# Patient Record
Sex: Female | Born: 1975
Health system: Southern US, Community
[De-identification: ages and names within clinical notes are randomized; demographics above are authoritative.]

## PROBLEM LIST (undated history)

## (undated) DIAGNOSIS — E079 Disorder of thyroid, unspecified: Secondary | ICD-10-CM

## (undated) DIAGNOSIS — T7840XA Allergy, unspecified, initial encounter: Secondary | ICD-10-CM

## (undated) DIAGNOSIS — J302 Other seasonal allergic rhinitis: Secondary | ICD-10-CM

## (undated) DIAGNOSIS — R5381 Other malaise: Secondary | ICD-10-CM

## (undated) DIAGNOSIS — E559 Vitamin D deficiency, unspecified: Secondary | ICD-10-CM

## (undated) DIAGNOSIS — Z803 Family history of malignant neoplasm of breast: Secondary | ICD-10-CM

## (undated) DIAGNOSIS — F419 Anxiety disorder, unspecified: Secondary | ICD-10-CM

## (undated) DIAGNOSIS — J020 Streptococcal pharyngitis: Secondary | ICD-10-CM

## (undated) DIAGNOSIS — I2089 Other forms of angina pectoris: Secondary | ICD-10-CM

## (undated) DIAGNOSIS — M316 Other giant cell arteritis: Secondary | ICD-10-CM

## (undated) DIAGNOSIS — R590 Localized enlarged lymph nodes: Secondary | ICD-10-CM

## (undated) DIAGNOSIS — E785 Hyperlipidemia, unspecified: Secondary | ICD-10-CM

## (undated) DIAGNOSIS — M314 Aortic arch syndrome [Takayasu]: Secondary | ICD-10-CM

## (undated) DIAGNOSIS — K219 Gastro-esophageal reflux disease without esophagitis: Secondary | ICD-10-CM

## (undated) DIAGNOSIS — Z808 Family history of malignant neoplasm of other organs or systems: Secondary | ICD-10-CM

## (undated) DIAGNOSIS — J189 Pneumonia, unspecified organism: Secondary | ICD-10-CM

## (undated) DIAGNOSIS — D649 Anemia, unspecified: Secondary | ICD-10-CM

## (undated) DIAGNOSIS — R6882 Decreased libido: Secondary | ICD-10-CM

## (undated) DIAGNOSIS — R0600 Dyspnea, unspecified: Secondary | ICD-10-CM

## (undated) DIAGNOSIS — A879 Viral meningitis, unspecified: Secondary | ICD-10-CM

## (undated) DIAGNOSIS — R011 Cardiac murmur, unspecified: Secondary | ICD-10-CM

## (undated) DIAGNOSIS — M1990 Unspecified osteoarthritis, unspecified site: Secondary | ICD-10-CM

## (undated) DIAGNOSIS — Z9889 Other specified postprocedural states: Secondary | ICD-10-CM

## (undated) DIAGNOSIS — Z800 Family history of malignant neoplasm of digestive organs: Secondary | ICD-10-CM

## (undated) DIAGNOSIS — M0600 Rheumatoid arthritis without rheumatoid factor, unspecified site: Secondary | ICD-10-CM

## (undated) DIAGNOSIS — J679 Hypersensitivity pneumonitis due to unspecified organic dust: Secondary | ICD-10-CM

## (undated) DIAGNOSIS — F32A Depression, unspecified: Secondary | ICD-10-CM

## (undated) DIAGNOSIS — B2790 Infectious mononucleosis, unspecified without complication: Secondary | ICD-10-CM

## (undated) DIAGNOSIS — N809 Endometriosis, unspecified: Secondary | ICD-10-CM

## (undated) DIAGNOSIS — I208 Other forms of angina pectoris: Secondary | ICD-10-CM

## (undated) DIAGNOSIS — R112 Nausea with vomiting, unspecified: Secondary | ICD-10-CM

## (undated) DIAGNOSIS — E039 Hypothyroidism, unspecified: Secondary | ICD-10-CM

## (undated) HISTORY — DX: Anxiety disorder, unspecified: F41.9

## (undated) HISTORY — DX: Family history of malignant neoplasm of digestive organs: Z80.0

## (undated) HISTORY — DX: Endometriosis, unspecified: N80.9

## (undated) HISTORY — PX: LAPAROSCOPIC ABDOMINAL EXPLORATION: SHX6249

## (undated) HISTORY — DX: Family history of malignant neoplasm of other organs or systems: Z80.8

## (undated) HISTORY — DX: Depression, unspecified: F32.A

## (undated) HISTORY — DX: Allergy, unspecified, initial encounter: T78.40XA

## (undated) HISTORY — DX: Gastro-esophageal reflux disease without esophagitis: K21.9

## (undated) HISTORY — DX: Family history of malignant neoplasm of breast: Z80.3

## (undated) HISTORY — DX: Cardiac murmur, unspecified: R01.1

---
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
## 2004-09-18 ENCOUNTER — Emergency Department: Payer: Self-pay | Admitting: Emergency Medicine

## 2004-09-19 ENCOUNTER — Ambulatory Visit: Payer: Self-pay | Admitting: Emergency Medicine

## 2005-10-24 ENCOUNTER — Ambulatory Visit: Payer: Self-pay | Admitting: Obstetrics and Gynecology

## 2005-10-31 ENCOUNTER — Ambulatory Visit: Payer: Self-pay | Admitting: Otolaryngology

## 2005-12-05 ENCOUNTER — Ambulatory Visit: Payer: Self-pay | Admitting: Otolaryngology

## 2005-12-15 HISTORY — PX: NOSE SURGERY: SHX723

## 2007-12-23 ENCOUNTER — Ambulatory Visit: Payer: Self-pay | Admitting: Obstetrics and Gynecology

## 2008-09-11 ENCOUNTER — Ambulatory Visit: Payer: Self-pay | Admitting: Obstetrics & Gynecology

## 2008-09-14 ENCOUNTER — Ambulatory Visit: Payer: Self-pay | Admitting: Obstetrics & Gynecology

## 2008-10-12 ENCOUNTER — Ambulatory Visit: Payer: Self-pay | Admitting: Chiropractic Medicine

## 2011-12-16 HISTORY — PX: HIP SURGERY: SHX245

## 2011-12-22 ENCOUNTER — Ambulatory Visit: Payer: Self-pay

## 2011-12-31 ENCOUNTER — Ambulatory Visit: Payer: Self-pay

## 2015-10-10 ENCOUNTER — Encounter: Payer: Self-pay | Admitting: General Surgery

## 2015-10-29 ENCOUNTER — Encounter: Payer: Self-pay | Admitting: General Surgery

## 2015-10-29 ENCOUNTER — Ambulatory Visit (INDEPENDENT_AMBULATORY_CARE_PROVIDER_SITE_OTHER): Payer: BLUE CROSS/BLUE SHIELD | Admitting: General Surgery

## 2015-10-29 ENCOUNTER — Ambulatory Visit: Payer: BLUE CROSS/BLUE SHIELD

## 2015-10-29 VITALS — BP 124/66 | HR 76 | Resp 14 | Ht 64.0 in | Wt 148.0 lb

## 2015-10-29 DIAGNOSIS — N63 Unspecified lump in breast: Secondary | ICD-10-CM | POA: Diagnosis not present

## 2015-10-29 DIAGNOSIS — N6310 Unspecified lump in the right breast, unspecified quadrant: Secondary | ICD-10-CM

## 2015-10-29 NOTE — Progress Notes (Signed)
Patient ID: Kara Anderson, female   DOB: [birth year], 39 y.o.   MRN: [id number]  Chief Complaint  Patient presents with  . Other    Right breast mass    HPI Kara Anderson is a 39 y.o. female here today for an evaluation of for right breast lump/tenderness. She noticed this about 2 months ago. She states her last mammogram was done in 2013. She does perform self breast checks. She has been a massage therapist for 15 years. She is right-hand dominant, and does use her upper extremity vigorously during her work. No history of trauma to the breast. HPI  Past Medical History  Diagnosis Date  . Endometriosis 2007,2009    Past Surgical History  Procedure Laterality Date  . Laparoscopic abdominal exploration  [id number]    endometriosis  . Hip surgery  2013  . Nose surgery  2007    Family History  Problem Relation Age of Onset  . Heart disease Mother   . Stroke Mother   . Diabetes Mother   . Heart disease Father   . Alcohol abuse Father     Social History Social History  Substance Use Topics  . Smoking status: Never Smoker   . Smokeless tobacco: Never Used  . Alcohol Use: No    Allergies  Allergen Reactions  . Codeine Nausea Only    Current Outpatient Prescriptions  Medication Sig Dispense Refill  . Multiple Vitamins-Minerals (MULTIVITAMIN GUMMIES WOMENS) CHEW Chew by mouth 2 (two) times daily.     No current facility-administered medications for this visit.    Review of Systems Review of Systems  Constitutional: Negative.   Respiratory: Negative.   Cardiovascular: Negative.     Blood pressure 124/66, pulse 76, resp. rate 14, height 5\' 4"  (1.626 m), weight 148 lb (67.132 kg), last menstrual period 10/08/2015.  Physical Exam Physical Exam  Constitutional: She is oriented to person, place, and time. She appears well-developed and well-nourished.  Eyes: Conjunctivae are normal. No scleral icterus.  Neck: Neck supple.  Cardiovascular: Normal rate, regular rhythm  and normal heart sounds.   Pulmonary/Chest: Effort normal and breath sounds normal. Right breast exhibits no inverted nipple, no mass, no nipple discharge, no skin change and no tenderness. Left breast exhibits no inverted nipple, no mass, no nipple discharge, no skin change and no tenderness. Breasts are asymmetrical (right larger than left).    Lymphadenopathy:    She has no cervical adenopathy.    She has no axillary adenopathy.  Neurological: She is alert and oriented to person, place, and time.  Skin: Skin is warm and dry.  Psychiatric: Her behavior is normal.    Data Reviewed Ultrasound examination of the right breast was completed in the upper-outer quadrant. There is a small lymph node in the inferior aspect of the axilla measuring 0.46 x 0.49 x 0.62. Fatty hilum is preserved. Normal vascular flow.  The area of prominence on the patient's exam corresponds with the end of the breast tissue in the axillary tail. This is about 8 cm from the edge of the nipple and the entire o'clock position. No discernible area of architectural distortion, cystic or solid lesions are identified. BI-RADS-2.  Assessment    Benign breast exam, prominence of axillary tail without underlying pathology.     Plan    Pros and cons of regular self examination were reviewed. The patient was encouraged to call should she appreciate any changes or develop any focal discomfort.      Call the  office with any changes or concerns.   Ref: Dr. Ammie Dalton PCP: Allyn Kenner, MD  Robert Bellow 10/29/2015, 8:37 PM

## 2015-10-29 NOTE — Patient Instructions (Addendum)
Call the office with any changes or concerns.

## 2017-10-22 DIAGNOSIS — R5383 Other fatigue: Secondary | ICD-10-CM | POA: Diagnosis not present

## 2017-10-22 DIAGNOSIS — R0600 Dyspnea, unspecified: Secondary | ICD-10-CM | POA: Diagnosis not present

## 2017-10-22 DIAGNOSIS — R6882 Decreased libido: Secondary | ICD-10-CM | POA: Diagnosis not present

## 2017-10-22 DIAGNOSIS — E785 Hyperlipidemia, unspecified: Secondary | ICD-10-CM | POA: Diagnosis not present

## 2017-10-22 DIAGNOSIS — Z0000 Encounter for general adult medical examination without abnormal findings: Secondary | ICD-10-CM | POA: Diagnosis not present

## 2017-10-22 DIAGNOSIS — R011 Cardiac murmur, unspecified: Secondary | ICD-10-CM | POA: Diagnosis not present

## 2017-10-22 DIAGNOSIS — E559 Vitamin D deficiency, unspecified: Secondary | ICD-10-CM | POA: Diagnosis not present

## 2017-10-23 ENCOUNTER — Ambulatory Visit (HOSPITAL_COMMUNITY)
Admission: RE | Admit: 2017-10-23 | Discharge: 2017-10-23 | Disposition: A | Discharge status: Home / Self Care | Payer: BLUE CROSS/BLUE SHIELD | Source: Ambulatory Visit | Attending: Internal Medicine | Admitting: Internal Medicine

## 2017-10-23 ENCOUNTER — Other Ambulatory Visit (HOSPITAL_COMMUNITY): Payer: Self-pay | Admitting: Internal Medicine

## 2017-10-23 ENCOUNTER — Ambulatory Visit (HOSPITAL_COMMUNITY): Payer: Self-pay

## 2017-10-23 ENCOUNTER — Encounter (HOSPITAL_COMMUNITY): Payer: Self-pay

## 2017-10-23 DIAGNOSIS — R011 Cardiac murmur, unspecified: Secondary | ICD-10-CM | POA: Diagnosis not present

## 2017-10-23 DIAGNOSIS — R0600 Dyspnea, unspecified: Secondary | ICD-10-CM | POA: Diagnosis not present

## 2017-10-23 DIAGNOSIS — N6310 Unspecified lump in the right breast, unspecified quadrant: Secondary | ICD-10-CM | POA: Diagnosis not present

## 2017-10-23 NOTE — Progress Notes (Signed)
*  PRELIMINARY RESULTS* Echocardiogram 2D Echocardiogram has been performed.  Leavy Cella 10/23/2017, 10:47 AM

## 2017-10-29 ENCOUNTER — Encounter: Payer: Self-pay | Admitting: Cardiovascular Disease

## 2017-10-29 ENCOUNTER — Ambulatory Visit (INDEPENDENT_AMBULATORY_CARE_PROVIDER_SITE_OTHER): Payer: BLUE CROSS/BLUE SHIELD | Admitting: Cardiovascular Disease

## 2017-10-29 VITALS — BP 102/62 | HR 68 | Ht 63.5 in | Wt 151.5 lb

## 2017-10-29 DIAGNOSIS — R0602 Shortness of breath: Secondary | ICD-10-CM | POA: Diagnosis not present

## 2017-10-29 DIAGNOSIS — R011 Cardiac murmur, unspecified: Secondary | ICD-10-CM | POA: Diagnosis not present

## 2017-10-29 DIAGNOSIS — R0989 Other specified symptoms and signs involving the circulatory and respiratory systems: Secondary | ICD-10-CM | POA: Diagnosis not present

## 2017-10-29 NOTE — Patient Instructions (Addendum)
Medication Instructions:   No medication changes made  Labwork:  No new labs needed  Testing/Procedures:  No further testing at this time   Follow-Up: It was a pleasure seeing you in the office today. Please call us if you have new issues that need to be addressed before your next appt.  [phone]  Your physician wants you to follow-up in:  We will order a carotid u/s on the right for bruit 10/30/17  If you need a refill on your cardiac medications before your next appointment, please call your pharmacy.

## 2017-10-29 NOTE — Progress Notes (Signed)
Cardiology Office Note  Date:  10/29/2017   ID:  Kara Anderson, DOB 1976/10/22, MRN [id number]  PCP:  Doree Albee, MD   Chief Complaint  Patient presents with  . other    New patient. Self referral for SOB and heart murmur. Meds reviewed verbally with patient.     HPI:  [name] is a 41 year old woman with no prior cardiac history who presents by self referral for murmur.  Recently seen by Dr. Anastasio Champion in [facility]  Reports having increasing shortness of breath  Long hiatus from exercise, now starting to get back into her exercise regimen Uncertain if the shortness of breath she is experiencing is secondary to deconditioning or something else  Denies any PND, cough, lower extremity edema, weight gain No significant change in her weight in general Works on a farm, generally very active at baseline Able to exert herself on flat surfaces for long periods of time, more difficulty with hills and stairs  Echocardiogram October 23, 2017 reviewed with her showing  Images pulled up in the office  normal LV systolic function, mildly calcified aortic valve leaflets, otherwise no significant findings to explain murmur   EKG personally reviewed by myself on todays visit shows normal sinus rhythm rate 68 bpm no significant ST or T wave changes   PMH:   has a past medical history of Endometriosis (2007,2009).  PSH:    Past Surgical History:  Procedure Laterality Date  . HIP SURGERY  2013  . LAPAROSCOPIC ABDOMINAL EXPLORATION  2007,2009   endometriosis  . NOSE SURGERY  2007    Current Outpatient Medications  Medication Sig Dispense Refill  . aspirin 81 MG chewable tablet Chew 81 mg daily by mouth.    . cholecalciferol (VITAMIN D) 1000 units tablet Take daily by mouth.    . Cyanocobalamin (VITAMIN B 12 PO) Take by mouth.    . montelukast (SINGULAIR) 10 MG tablet Take 10 mg daily by mouth.  2  . Multiple Vitamins-Minerals (MULTIVITAMIN GUMMIES WOMENS) CHEW Chew by  mouth 2 (two) times daily.     No current facility-administered medications for this visit.      Allergies:   Codeine   Social History:  The patient  reports that  has never smoked. she has never used smokeless tobacco. She reports that she does not drink alcohol or use drugs.   Family History:   family history includes Alcohol abuse in her father; Diabetes in her mother; Heart disease in her father and mother; Stroke in her mother.    Review of Systems: Review of Systems  Constitutional: Positive for malaise/fatigue.  Respiratory: Positive for shortness of breath.   Cardiovascular: Negative.   Gastrointestinal: Negative.   Musculoskeletal: Negative.   Neurological: Negative.   Psychiatric/Behavioral: Negative.   All other systems reviewed and are negative.    PHYSICAL EXAM: VS:  BP 102/62 (BP Location: Left Arm, Patient Position: Sitting, Cuff Size: Normal)   Pulse 68   Ht 5' 3.5" (1.613 m)   Wt 151 lb 8 oz (68.7 kg)   BMI 26.42 kg/m  , BMI Body mass index is 26.42 kg/m. GEN: Well nourished, well developed, in no acute distress  HEENT: normal  Neck: no JVD, carotid bruits, or masses Cardiac: RRR; no murmurs, rubs, or gallops,no edema  There is a murmurs/bruit appreciated over the innominate, right of the sternal notch extending up right carotid, right subclavian Respiratory:  clear to auscultation bilaterally, normal work of breathing GI: soft, nontender,  nondistended, + BS MS: no deformity or atrophy  Skin: warm and dry, no rash Neuro:  Strength and sensation are intact Psych: euthymic mood, full affect    Recent Labs: No results found for requested labs within last 8760 hours.    Lipid Panel No results found for: CHOL, HDL, LDLCALC, TRIG    Wt Readings from Last 3 Encounters:  10/29/17 151 lb 8 oz (68.7 kg)  10/29/15 148 lb (67.1 kg)       ASSESSMENT AND PLAN:  Shortness of breath - Plan: EKG 12-Lead Etiology unclear, Unable to exclude AV  malformation, carotid ultrasound ordered which will evaluate innominate vessel.  Scheduled for tomorrow 2 PM  If unable to visualize, may need CT with contrast  Bruit - Plan: VAS US CAROTID As detailed above we have ordered carotid ultrasounds to help localize the bruit.  Suspected AV malformation vs pulmonary artery stenosis.  Unable to exclude stenosis. May need CT scan if unable to visualize  Disposition:   We will discuss ultrasound with her tomorrow when she comes back for study   Total encounter time more than 45 minutes  Greater than 50% was spent in counseling and coordination of care with the patient   Orders Placed This Encounter  Procedures  . EKG 12-Lead     Signed, Esmond Plants, M.D., Ph.D. 10/29/2017  [place]

## 2017-10-30 ENCOUNTER — Telehealth: Payer: Self-pay | Admitting: *Deleted

## 2017-10-30 ENCOUNTER — Ambulatory Visit (INDEPENDENT_AMBULATORY_CARE_PROVIDER_SITE_OTHER): Payer: BLUE CROSS/BLUE SHIELD

## 2017-10-30 DIAGNOSIS — R0602 Shortness of breath: Secondary | ICD-10-CM

## 2017-10-30 DIAGNOSIS — R011 Cardiac murmur, unspecified: Secondary | ICD-10-CM

## 2017-10-30 DIAGNOSIS — R0989 Other specified symptoms and signs involving the circulatory and respiratory systems: Secondary | ICD-10-CM

## 2017-10-30 NOTE — Telephone Encounter (Signed)
Patient here in office today for echo.  Dr Rockey Situ reviewed results and asked to order a CT with contrast of chest and neck r/o pulmonary artery stenosis due to bruit around proximal pulmonary artery.  Possibly to be done on Monday morning or afternoon. Patient has dentist appointment in [place] at 11:20 am.  Per Dr Rockey Situ, least expensive location for CT may be [facility] in [place].  Called patient. She is going to call her insurance company on Monday and ask if she has met her deductible and inquire upon which location is least expensive for her. She will then call us to let us know so we can proceed with scheduling.

## 2017-10-31 DIAGNOSIS — R0989 Other specified symptoms and signs involving the circulatory and respiratory systems: Secondary | ICD-10-CM | POA: Insufficient documentation

## 2017-10-31 DIAGNOSIS — R0602 Shortness of breath: Secondary | ICD-10-CM | POA: Insufficient documentation

## 2017-10-31 DIAGNOSIS — R011 Cardiac murmur, unspecified: Secondary | ICD-10-CM | POA: Insufficient documentation

## 2017-11-02 NOTE — Telephone Encounter (Signed)
Called [facility] off [address]. They have available appointment on 11/04/17 at 1100, arrival 10:40 am. Address is [place], [place]. Patient is to have only liquids 4 hours prior.  Called patient. She is agreeable to CT chest on 11/04/17. She verbalized understanding of location and appointment date and time as well as instructions.  Dorian Pod in pre-cert sent staff message for prior authorization.

## 2017-11-02 NOTE — Telephone Encounter (Signed)
Order changed to the following per Dr Rockey Situ: CTA chest PE protocol to evaluate proximal pulmonary artery, suspect pulm artery stenosis above pulmonic valve, bruit.      Received incoming call from patient. She called her insurance company. They said she had not met deductible; however, her most result tests and ultrasounds are not reflected in that yet. Patient said she was fine having the CT closer to her home at [facility]. She also provided me with number for the Prior Review process, [phone]. Advised our pre-cert department would handle this.  Advised patient I will start the process and call her back with scheduling details.

## 2017-11-02 NOTE — Telephone Encounter (Signed)
Can we change the CT order?  We need regular CTA chest PE protocol to evaluate proximal pulmonary artery, suspect pulm artery stenosis above pulmonic valve, bruit    Does not need to go up carotids   Does not need neck scan  Regular CTA chest will include base of carotids  thx  TG

## 2017-11-04 ENCOUNTER — Other Ambulatory Visit: Payer: Self-pay

## 2017-11-04 ENCOUNTER — Encounter (HOSPITAL_COMMUNITY): Payer: Self-pay | Admitting: Internal Medicine

## 2017-11-04 ENCOUNTER — Inpatient Hospital Stay (HOSPITAL_COMMUNITY)
Admission: AD | Admit: 2017-11-04 | Discharge: 2017-11-06 | DRG: 177 | Disposition: A | Discharge status: Home / Self Care | Payer: BLUE CROSS/BLUE SHIELD | Source: Ambulatory Visit | Attending: Family Medicine | Admitting: Family Medicine

## 2017-11-04 ENCOUNTER — Telehealth: Payer: Self-pay | Admitting: Cardiovascular Disease

## 2017-11-04 ENCOUNTER — Ambulatory Visit
Admission: RE | Admit: 2017-11-04 | Discharge: 2017-11-04 | Disposition: A | Discharge status: Home / Self Care | Payer: BLUE CROSS/BLUE SHIELD | Source: Ambulatory Visit | Attending: Cardiovascular Disease | Admitting: Cardiovascular Disease

## 2017-11-04 DIAGNOSIS — Z79899 Other long term (current) drug therapy: Secondary | ICD-10-CM | POA: Diagnosis not present

## 2017-11-04 DIAGNOSIS — J9859 Other diseases of mediastinum, not elsewhere classified: Secondary | ICD-10-CM | POA: Diagnosis present

## 2017-11-04 DIAGNOSIS — J310 Chronic rhinitis: Secondary | ICD-10-CM | POA: Diagnosis not present

## 2017-11-04 DIAGNOSIS — R0989 Other specified symptoms and signs involving the circulatory and respiratory systems: Secondary | ICD-10-CM

## 2017-11-04 DIAGNOSIS — Z7982 Long term (current) use of aspirin: Secondary | ICD-10-CM

## 2017-11-04 DIAGNOSIS — I370 Nonrheumatic pulmonary valve stenosis: Secondary | ICD-10-CM

## 2017-11-04 DIAGNOSIS — N83201 Unspecified ovarian cyst, right side: Secondary | ICD-10-CM | POA: Diagnosis not present

## 2017-11-04 DIAGNOSIS — R222 Localized swelling, mass and lump, trunk: Secondary | ICD-10-CM | POA: Diagnosis not present

## 2017-11-04 DIAGNOSIS — Q256 Stenosis of pulmonary artery: Secondary | ICD-10-CM | POA: Diagnosis not present

## 2017-11-04 DIAGNOSIS — R0602 Shortness of breath: Secondary | ICD-10-CM | POA: Diagnosis not present

## 2017-11-04 DIAGNOSIS — I2699 Other pulmonary embolism without acute cor pulmonale: Secondary | ICD-10-CM | POA: Diagnosis not present

## 2017-11-04 DIAGNOSIS — Z885 Allergy status to narcotic agent status: Secondary | ICD-10-CM

## 2017-11-04 DIAGNOSIS — R160 Hepatomegaly, not elsewhere classified: Secondary | ICD-10-CM | POA: Diagnosis not present

## 2017-11-04 LAB — COMPREHENSIVE METABOLIC PANEL
ALK PHOS: 60 U/L (ref 38–126)
ALT: 11 U/L — AB (ref 14–54)
AST: 15 U/L (ref 15–41)
Albumin: 3.5 g/dL (ref 3.5–5.0)
Anion gap: 8 (ref 5–15)
BUN: 15 mg/dL (ref 6–20)
CALCIUM: 9.5 mg/dL (ref 8.9–10.3)
CHLORIDE: 100 mmol/L — AB (ref 101–111)
CO2: 27 mmol/L (ref 22–32)
CREATININE: 0.68 mg/dL (ref 0.44–1.00)
GFR calc non Af Amer: >60 mL/min (ref >60)
GLUCOSE: 100 mg/dL — AB (ref 65–99)
Potassium: 3.7 mmol/L (ref 3.5–5.1)
SODIUM: 135 mmol/L (ref 135–145)
Total Bilirubin: 0.8 mg/dL (ref 0.3–1.2)
Total Protein: 8.1 g/dL (ref 6.5–8.1)

## 2017-11-04 LAB — CBC
HCT: 35.1 % — ABNORMAL LOW (ref 36.0–46.0)
Hemoglobin: 11.4 g/dL — ABNORMAL LOW (ref 12.0–15.0)
MCH: 27.5 pg (ref 26.0–34.0)
MCHC: 32.5 g/dL (ref 30.0–36.0)
MCV: 84.6 fL (ref 78.0–100.0)
PLATELETS: 336 10*3/uL (ref 150–400)
RBC: 4.15 MIL/uL (ref 3.87–5.11)
RDW: 14.1 % (ref 11.5–15.5)
WBC: 12.1 10*3/uL — ABNORMAL HIGH (ref 4.0–10.5)

## 2017-11-04 LAB — C-REACTIVE PROTEIN: CRP: 1.5 mg/dL — ABNORMAL HIGH (ref <1.0)

## 2017-11-04 LAB — TSH: TSH: 4.09 u[IU]/mL (ref 0.350–4.500)

## 2017-11-04 LAB — SEDIMENTATION RATE: Sed Rate: 51 mm/hr — ABNORMAL HIGH (ref 0–22)

## 2017-11-04 LAB — LACTATE DEHYDROGENASE: LDH: 98 U/L (ref 98–192)

## 2017-11-04 MED ORDER — SODIUM CHLORIDE 0.9 % IV SOLN
INTRAVENOUS | Status: DC
  Administered 2017-11-04: via INTRAVENOUS

## 2017-11-04 MED ORDER — ACETAMINOPHEN 650 MG RE SUPP
650.0000 mg | Freq: Four times a day (QID) | RECTAL | Status: DC | PRN
Start: 2017-11-04 — End: 2017-11-06

## 2017-11-04 MED ORDER — ACETAMINOPHEN 325 MG PO TABS
650.0000 mg | ORAL_TABLET | Freq: Four times a day (QID) | ORAL | Status: DC | PRN
  Administered 2017-11-05: 650 mg via ORAL
  Filled 2017-11-04: qty 2

## 2017-11-04 MED ORDER — ENOXAPARIN SODIUM 40 MG/0.4ML ~~LOC~~ SOLN: 40.0000 mg | SUBCUTANEOUS | Status: DC

## 2017-11-04 MED ORDER — IOPAMIDOL (ISOVUE-370) INJECTION 76%
75.0000 mL | Freq: Once | INTRAVENOUS | Status: AC | PRN
  Administered 2017-11-04: 75 mL via INTRAVENOUS

## 2017-11-04 NOTE — H&P (Signed)
                                                                                                         TRH H&P   Patient Demographics:    Kara Anderson, is a 41 y.o. female  MRN: [id number]   DOB - 06/01/1976  Admit Date - 11/04/2017  Outpatient Primary MD for the patient is Gosrani, Nimish C, MD  Referring MD/NP/PA:   Outpatient Specialists:    Timothy Gollan   Patient coming from: home  No chief complaint on file.  Cardiac murmer   HPI:    Kara Anderson  is a 41 y.o. female, w c/o increasing dyspnea for the past 2months,  Sent by pcp to have cardiac echo.  And today was sent for CT chest which showed mediastinal mass. CT chest=> IMPRESSION: 1. Mass-like thickening of the pulmonary arteries which are severely narrowed bilaterally. This mass-like thickening is contiguous with abnormal soft tissue in the middle mediastinum, and there is some associated prevascular lymphadenopathy is well. Some associated mural thickening of the adjacent portion of the ascending thoracic aorta and undersurface of the thoracic aortic arch. Findings are of uncertain etiology, with differential considerations favoring a large vessel vasculitis such as Takayasu's arteritis (although it is unusual that there is no apparent involvement of the great vessels of the mediastinum or visualized intraabdominal arteries) or lymphoma. Findings are not favored to represent a primary pulmonary sarcoma. 2. No evidence of pulmonary embolism.    Her cardiologist requested medical admission for w/up of mediastinal mass and bilateral pulmonary artery stenosis.       Review of systems:    In addition to the HPI above, No Fever-chills, No Headache, No changes with Vision or hearing, No problems swallowing food or Liquids, No Chest pain, Cough  No Abdominal pain, No Nausea or Vommitting, Bowel movements are regular, No Blood in stool or Urine, No dysuria, No new skin rashes or bruises, No new joints  pains-aches,  No new weakness, tingling, numbness in any extremity, No recent weight gain or loss, No polyuria, polydypsia or polyphagia, No significant Mental Stressors.  A full 10 point Review of Systems was done, except as stated above, all other Review of Systems were negative.   With Past History of the following :    Past Medical History:  Diagnosis Date  . Endometriosis 2007,2009      Past Surgical History:  Procedure Laterality Date  . HIP SURGERY  2013  . LAPAROSCOPIC ABDOMINAL EXPLORATION  2007,2009   endometriosis  . NOSE SURGERY  2007      Social History:     Social History   Tobacco Use  . Smoking status: Never Smoker  . Smokeless tobacco: Never Used  Substance Use Topics  . Alcohol use: No    Alcohol/week: 0.0 oz     Lives - at home  Mobility - walks by self   Family History :     Family History  Problem Relation Age of Onset  . Heart disease Mother   . Stroke   Mother   . Diabetes Mother   . Heart disease Father   . Alcohol abuse Father       Home Medications:   Prior to Admission medications   Medication Sig Start Date End Date Taking? Authorizing Provider  aspirin 81 MG chewable tablet Chew 81 mg daily by mouth.    [provider]  cholecalciferol (VITAMIN D) 1000 units tablet Take daily by mouth.    [provider]  Cyanocobalamin (VITAMIN B 12 PO) Take by mouth.    [provider]  montelukast (SINGULAIR) 10 MG tablet Take 10 mg daily by mouth. 10/22/17   [provider]  Multiple Vitamins-Minerals (MULTIVITAMIN GUMMIES WOMENS) CHEW Chew by mouth 2 (two) times daily.    [provider]     Allergies:     Allergies  Allergen Reactions  . Codeine Nausea Only     Physical Exam:   Vitals  There were no vitals taken for this visit.   1. General  lying in bed in NAD,   2. Normal affect and insight, Not Suicidal or Homicidal, Awake Alert, Oriented X 3.  3. No F.N deficits, ALL  C.Nerves Intact, Strength 5/5 all 4 extremities, Sensation intact all 4 extremities, Plantars down going.  4. Ears and Eyes appear Normal, Conjunctivae clear, PERRLA. Moist Oral Mucosa.  5. Supple Neck, No JVD, No cervical lymphadenopathy appriciated, No Carotid Bruits.  6. Symmetrical Chest wall movement, Good air movement bilaterally, CTAB.  7. RRR, s1, s2, 2/6 sem rusb/Lusb  8. Positive Bowel Sounds, Abdomen Soft, No tenderness, No organomegaly appriciated,No rebound -guarding or rigidity.  9.  No Cyanosis, Normal Skin Turgor, No Skin Rash or Bruise.  10. Good muscle tone,  joints appear normal , no effusions, Normal ROM.  11. No Palpable Lymph Nodes in Neck or Axillae     Data Review:    CBC No results for input(s): WBC, HGB, HCT, PLT, MCV, MCH, MCHC, RDW, LYMPHSABS, MONOABS, EOSABS, BASOSABS, BANDABS in the last 168 hours.  Invalid input(s): NEUTRABS, BANDSABD ------------------------------------------------------------------------------------------------------------------  Chemistries  No results for input(s): NA, K, CL, CO2, GLUCOSE, BUN, CREATININE, CALCIUM, MG, AST, ALT, ALKPHOS, BILITOT in the last 168 hours.  Invalid input(s): GFRCGP ------------------------------------------------------------------------------------------------------------------ CrCl cannot be calculated (No order found.). ------------------------------------------------------------------------------------------------------------------ No results for input(s): TSH, T4TOTAL, T3FREE, THYROIDAB in the last 72 hours.  Invalid input(s): FREET3  Coagulation profile No results for input(s): INR, PROTIME in the last 168 hours. ------------------------------------------------------------------------------------------------------------------- No results for input(s): DDIMER in the last 72  hours. -------------------------------------------------------------------------------------------------------------------  Cardiac Enzymes No results for input(s): CKMB, TROPONINI, MYOGLOBIN in the last 168 hours.  Invalid input(s): CK ------------------------------------------------------------------------------------------------------------------ No results found for: BNP   ---------------------------------------------------------------------------------------------------------------  Urinalysis No results found for: COLORURINE, APPEARANCEUR, LABSPEC, PHURINE, GLUCOSEU, HGBUR, BILIRUBINUR, KETONESUR, PROTEINUR, UROBILINOGEN, NITRITE, LEUKOCYTESUR  ----------------------------------------------------------------------------------------------------------------   Imaging Results:    Ct Angio Chest Pe W Or Wo Contrast  Result Date: 11/04/2017 CLINICAL DATA:  41 year old female with newly diagnosed heart murmur. EXAM: CT ANGIOGRAPHY CHEST WITH CONTRAST TECHNIQUE: Multidetector CT imaging of the chest was performed using the standard protocol during bolus administration of intravenous contrast. Multiplanar CT image reconstructions and MIPs were obtained to evaluate the vascular anatomy. CONTRAST:  18m ISOVUE-370 IOPAMIDOL (ISOVUE-370) INJECTION 76% COMPARISON:  No priors. FINDINGS: Cardiovascular: No filling defect within the pulmonary arterial tree to suggest underlying pulmonary embolism. However, there is profound narrowing of the pulmonary artery is bilaterally to minimum diameter of 4 mm on the left and 6 mm on the  right. This profound mural thickening is somewhat mass-like in appearance and contiguous with adjacent soft tissue in the middle mediastinum which extends to the undersurface of the aortic arch. There is also soft tissue thickening between the right main pulmonary and adjacent ascending thoracic aorta, presumably via their shared adventitia. Notably, there is no other apparent  mural thickening of the descending thoracic aorta or great vessels of the mediastinum. Mediastinum/Nodes: Lymphadenopathy noted in the prevascular nodal stations, measuring up to 1.5 cm in short axis. Amorphous soft tissue also noted in the middle mediastinum between the pulmonary artery is an the undersurface of the aortic arch, potentially additional nodal tissue. Esophagus is unremarkable in appearance. No axillary lymphadenopathy. Lungs/Pleura: No suspicious-appearing pulmonary nodules or masses. No acute consolidative airspace disease. No pleural effusions. Upper Abdomen: Unremarkable. Specifically, no mural thickening or luminal narrowing associated with the abdominal aorta or major intraabdominal arteries. Musculoskeletal: There are no aggressive appearing lytic or blastic lesions noted in the visualized portions of the skeleton. Review of the MIP images confirms the above findings. IMPRESSION: 1. Mass-like thickening of the pulmonary arteries which are severely narrowed bilaterally. This mass-like thickening is contiguous with abnormal soft tissue in the middle mediastinum, and there is some associated prevascular lymphadenopathy is well. Some associated mural thickening of the adjacent portion of the ascending thoracic aorta and undersurface of the thoracic aortic arch. Findings are of uncertain etiology, with differential considerations favoring a large vessel vasculitis such as Takayasu's arteritis (although it is unusual that there is no apparent involvement of the great vessels of the mediastinum or visualized intraabdominal arteries) or lymphoma. Findings are not favored to represent a primary pulmonary sarcoma. 2. No evidence of pulmonary embolism. These results were called by telephone at the time of interpretation on 11/04/2017 at 12:48 pm to Dr. Ida Rogue, who verbally acknowledged these results. Electronically Signed   By: Vinnie Langton M.D.   On: 11/04/2017 12:48      Assessment &  Plan:    Active Problems:   Mediastinal mass   Pulmonic stenosis (bilateral) Mediastinal mass Dyspnea  Check cbc, cmp, ldh, esr, crp, alpha feta protein, cea, Hcg, 5HIAA Please see Dr. Darcus Pester brief note Pulmonary consulted , spoke with Dr. Elsworth Soho for pulmonary consult in AM May need referal to tertiary center such as [place] or [facility]  DVT Prophylaxis Lovenox - SCDs   AM Labs Ordered, also please review Full Orders  Family Communication: Admission, patients condition and plan of care including tests being ordered have been discussed with the patient  who indicate understanding and agree with the plan and Code Status.  Code Status FULL CODE  Likely DC to  home  Condition GUARDED    Consults called: pulmonary   Admission status: inpatient  Time spent in minutes : 45   Jani Gravel M.D on 11/04/2017 at 10:11 PM  Between 7am to 7pm - Pager - [phone]  . After 7pm go to [email or web address] - password [facility]  [facility] - Office  [phone]

## 2017-11-04 NOTE — Telephone Encounter (Signed)
Also discussed case with Dr. Quay Burow He is not aware of any possible interventions for pulmonary artery stenosis at that level above the pulmonary valve He has recommended rapid workup for mass and treatment to shrink compression on pulmonary arteries bilaterally  Patient and husband are aware for any clinical change such as shortness of breath , she will go to the emergency room  Signed, Esmond Plants, MD, Ph.D [facility]

## 2017-11-04 NOTE — Telephone Encounter (Signed)
Received phone call from radiology earlier this afternoon concerning CT scan findings Large mediastinal mass with severe stenosis of bilateral pulmonary arteries, suspected mass compression  Artery 4 mm one side, 6 mm other side Lymphadenopathy, mediastinal Please see with CT scan results for complete details  Case discussed quickly with cardiology colleagues, Discussed with pulmonary, Dr. Alva Garnet, Concern for lymphoma with mass compression on pulmonary arteries   Discussed case with Dr. Roxan Hockey, CT surgery Discussed various approaches for obtaining biopsy Discussed various strategies for obtaining tissue, uncertain if interventional radiology would be able to perform the procedure  Discussed case with medicine service, Dr. Marily Memos, He has accepted the patient for admission, stepdown In an effort to help expedite workup and given severe stenosis of bilateral pulmonary arteries  I have contacted bed control They are aware and will contact the patient when bed available, hopefully in the next few hours  Case discussed with oncology, Dr.Perlov who has recommended urgent evaluation Discussed various strategies for obtaining tissue diagnosis He has placed the patient on his rounding list  Patient updated during the day with phone calls She will be ready and waiting to accept bed when this becomes available day or night  Signed, Esmond Plants, MD, Ph.D [facility]

## 2017-11-05 ENCOUNTER — Inpatient Hospital Stay (HOSPITAL_COMMUNITY): Payer: BLUE CROSS/BLUE SHIELD

## 2017-11-05 DIAGNOSIS — I370 Nonrheumatic pulmonary valve stenosis: Secondary | ICD-10-CM

## 2017-11-05 DIAGNOSIS — J9859 Other diseases of mediastinum, not elsewhere classified: Principal | ICD-10-CM

## 2017-11-05 LAB — HCG, SERUM, QUALITATIVE: PREG SERUM: NEGATIVE

## 2017-11-05 LAB — MRSA PCR SCREENING: MRSA BY PCR: NEGATIVE

## 2017-11-05 MED ORDER — IOPAMIDOL (ISOVUE-300) INJECTION 61%
INTRAVENOUS | Status: AC
  Administered 2017-11-05: 100 mL
  Filled 2017-11-05: qty 100

## 2017-11-05 MED ORDER — ONDANSETRON HCL 4 MG/2ML IJ SOLN
4.0000 mg | Freq: Four times a day (QID) | INTRAMUSCULAR | Status: DC | PRN
  Administered 2017-11-05 (×2): 4 mg via INTRAVENOUS
  Filled 2017-11-05 (×2): qty 2

## 2017-11-05 NOTE — Progress Notes (Signed)
PROGRESS NOTE    Kara Anderson  DEY:[id number] DOB: 02/19/1976 DOA: 11/04/2017 PCP: Doree Albee, MD    Brief Narrative:   41 y.o. female, w c/o increasing dyspnea for the past 34months,  Sent by pcp to have cardiac echo.  And today was sent for CT chest which showed mediastinal mass.  Assessment & Plan:   Active Problems:   Mediastinal mass/ Pulmonary stenosis - continue supportive therapy - consulted pulmonary team for assistance with further evaluation and recommendations.   DVT prophylaxis: Lovenox Code Status: Full Family Communication:  Disposition Plan:    Consultants:   Pulmonary specialist   Procedures: none   Antimicrobials: none  Subjective: Pt has no new complaints.  Objective: Vitals:   11/05/17 0410 11/05/17 0602 11/05/17 0746 11/05/17 1135  BP: (!) 96/56  102/67 124/76  Pulse: 72  74 69  Resp: 12  14 12   Temp: 98.4 F (36.9 C)  98.2 F (36.8 C) 98.3 F (36.8 C)  TempSrc: Oral  Oral Oral  SpO2: 99%  99% 100%  Weight:  66.2 kg (146 lb)    Height:        Intake/Output Summary (Last 24 hours) at 11/05/2017 1245 Last data filed at 11/05/2017 1135 Gross per 24 hour  Intake 1117.5 ml  Output 700 ml  Net 417.5 ml   Filed Weights   11/04/17 2206 11/04/17 2359 11/05/17 0602  Weight: 66.5 kg (146 lb 9.7 oz) 66.5 kg (146 lb 9.7 oz) 66.2 kg (146 lb)    Examination:  General exam: Appears calm and comfortable, in nad.  Respiratory system: Clear to auscultation. Respiratory effort normal. Equal chest rise. Cardiovascular system: S1 & S2 heard, RRR. No JVD, murmurs, rubs, gallops or clicks. No pedal edema. Gastrointestinal system: Abdomen is nondistended, soft and nontender. No organomegaly or masses felt. Normal bowel sounds heard. Central nervous system: Alert and oriented. No focal neurological deficits. Extremities: Symmetric 5 x 5 power. Skin: No rashes, lesions or ulcers, on limited exam. Psychiatry: Mood & affect appropriate.      Data Reviewed: I have personally reviewed following labs and imaging studies  CBC: Recent Labs  Lab 11/04/17 2231  WBC 12.1*  HGB 11.4*  HCT 35.1*  MCV 84.6  PLT 314   Basic Metabolic Panel: Recent Labs  Lab 11/04/17 2231  NA 135  K 3.7  CL 100*  CO2 27  GLUCOSE 100*  BUN 15  CREATININE 0.68  CALCIUM 9.5   GFR: Estimated Creatinine Clearance: 85 mL/min (by C-G formula based on SCr of 0.68 mg/dL). Liver Function Tests: Recent Labs  Lab 11/04/17 2231  AST 15  ALT 11*  ALKPHOS 60  BILITOT 0.8  PROT 8.1  ALBUMIN 3.5   No results for input(s): LIPASE, AMYLASE in the last 168 hours. No results for input(s): AMMONIA in the last 168 hours. Coagulation Profile: No results for input(s): INR, PROTIME in the last 168 hours. Cardiac Enzymes: No results for input(s): CKTOTAL, CKMB, CKMBINDEX, TROPONINI in the last 168 hours. BNP (last 3 results) No results for input(s): PROBNP in the last 8760 hours. HbA1C: No results for input(s): HGBA1C in the last 72 hours. CBG: No results for input(s): GLUCAP in the last 168 hours. Lipid Profile: No results for input(s): CHOL, HDL, LDLCALC, TRIG, CHOLHDL, LDLDIRECT in the last 72 hours. Thyroid Function Tests: Recent Labs    11/04/17 2252  TSH 4.090   Anemia Panel: No results for input(s): VITAMINB12, FOLATE, FERRITIN, TIBC, IRON, RETICCTPCT in the last  72 hours. Sepsis Labs: No results for input(s): PROCALCITON, LATICACIDVEN in the last 168 hours.  Recent Results (from the past 240 hour(s))  MRSA PCR Screening     Status: None   Collection Time: 11/04/17 11:27 PM  Result Value Ref Range Status   MRSA by PCR NEGATIVE NEGATIVE Final    Comment:        The GeneXpert MRSA Assay (FDA approved for NASAL specimens only), is one component of a comprehensive MRSA colonization surveillance program. It is not intended to diagnose MRSA infection nor to guide or monitor treatment for MRSA infections.           Radiology Studies: Ct Angio Chest Pe W Or Wo Contrast  Result Date: 11/04/2017 CLINICAL DATA:  41 year old female with newly diagnosed heart murmur. EXAM: CT ANGIOGRAPHY CHEST WITH CONTRAST TECHNIQUE: Multidetector CT imaging of the chest was performed using the standard protocol during bolus administration of intravenous contrast. Multiplanar CT image reconstructions and MIPs were obtained to evaluate the vascular anatomy. CONTRAST:  78mL ISOVUE-370 IOPAMIDOL (ISOVUE-370) INJECTION 76% COMPARISON:  No priors. FINDINGS: Cardiovascular: No filling defect within the pulmonary arterial tree to suggest underlying pulmonary embolism. However, there is profound narrowing of the pulmonary artery is bilaterally to minimum diameter of 4 mm on the left and 6 mm on the right. This profound mural thickening is somewhat mass-like in appearance and contiguous with adjacent soft tissue in the middle mediastinum which extends to the undersurface of the aortic arch. There is also soft tissue thickening between the right main pulmonary and adjacent ascending thoracic aorta, presumably via their shared adventitia. Notably, there is no other apparent mural thickening of the descending thoracic aorta or great vessels of the mediastinum. Mediastinum/Nodes: Lymphadenopathy noted in the prevascular nodal stations, measuring up to 1.5 cm in short axis. Amorphous soft tissue also noted in the middle mediastinum between the pulmonary artery is an the undersurface of the aortic arch, potentially additional nodal tissue. Esophagus is unremarkable in appearance. No axillary lymphadenopathy. Lungs/Pleura: No suspicious-appearing pulmonary nodules or masses. No acute consolidative airspace disease. No pleural effusions. Upper Abdomen: Unremarkable. Specifically, no mural thickening or luminal narrowing associated with the abdominal aorta or major intraabdominal arteries. Musculoskeletal: There are no aggressive appearing lytic or  blastic lesions noted in the visualized portions of the skeleton. Review of the MIP images confirms the above findings. IMPRESSION: 1. Mass-like thickening of the pulmonary arteries which are severely narrowed bilaterally. This mass-like thickening is contiguous with abnormal soft tissue in the middle mediastinum, and there is some associated prevascular lymphadenopathy is well. Some associated mural thickening of the adjacent portion of the ascending thoracic aorta and undersurface of the thoracic aortic arch. Findings are of uncertain etiology, with differential considerations favoring a large vessel vasculitis such as Takayasu's arteritis (although it is unusual that there is no apparent involvement of the great vessels of the mediastinum or visualized intraabdominal arteries) or lymphoma. Findings are not favored to represent a primary pulmonary sarcoma. 2. No evidence of pulmonary embolism. These results were called by telephone at the time of interpretation on 11/04/2017 at 12:48 pm to Dr. Ida Rogue, who verbally acknowledged these results. Electronically Signed   By: Vinnie Langton M.D.   On: 11/04/2017 12:48        Scheduled Meds: . enoxaparin (LOVENOX) injection  40 mg Subcutaneous Q24H   Continuous Infusions:   LOS: 1 day    Time spent: > 20 min    Velvet Bathe, MD [facility] Pager  [phone]  If 7PM-7AM, please contact night-coverage [email or web address] Password [facility] 11/05/2017, 12:45 PM

## 2017-11-05 NOTE — Progress Notes (Addendum)
ABD CT neg x for hepatomegaly which is not likely passively congested based on estimates of RA pressures  Will make NPO p midnight and d/c lovenox  and see if we can expedite ebus in AM 11/23  Christinia Gully, MD Pulmonary and [place] Cell [phone] After 5:30 PM or weekends, use Beeper [phone]

## 2017-11-05 NOTE — Consult Note (Addendum)
PULMONARY / CRITICAL CARE MEDICINE   Name: Kara Anderson MRN: [id number] DOB: Dec 15, 1976    ADMISSION DATE:  11/04/2017 CONSULTATION DATE:  11/05/17  REFERRING MD:  Dr Kim/ [facility]   CHIEF COMPLAINT:  Sob / med mass  HISTORY OF PRESENT ILLNESS:   Very healthy 41 yowf never smoker with > 41 m h/o malaise/ fatigue and new doe x 2 m eval by cards with CTa 11/04/17 with dx of med mass compressing PA' s severely s RVE or airway involvement so admitted for expedient w/u.   Pt gives h/o soaking NS x 6 m and midline chest discomfort for a few weeks and now sob if talks a lot or just walks more than room to room. No dysphagia/ anorexia or wt loss  Does have h/o rhinitis rx with singulair and nettipot but no asthma or prior lung dz or known primary tumor or exp to histo or other fungi   No obvious day to day or daytime variability or assoc excess/ purulent sputum or mucus plugs or hemoptysis or   chest tightness, subjective wheeze or overt sinus or hb symptoms. No unusual exp hx or h/o childhood pna/ asthma or knowledge of premature birth.  Sleeping ok flat without nocturnal  or early am exacerbation  of respiratory  c/o's or need for noct saba. Also denies any obvious fluctuation of symptoms with weather or environmental changes or other aggravating or alleviating factors except as outlined above   Current Allergies, Complete Past Medical History, Past Surgical History, Family History, and Social History were reviewed in [employer] record.  ROS  The following are not active complaints unless bolded Hoarseness, sore throat, dysphagia, dental problems, itching, sneezing,  nasal congestion or discharge of excess mucus or purulent secretions, ear ache,   fever, chills, sweats, unintended wt loss or wt gain, classically pleuritic or exertional cp,  orthopnea pnd or leg swelling, presyncope, palpitations, abdominal pain, anorexia, nausea, vomiting, diarrhea  or change in bowel  habits or change in bladder habits, change in stools or change in urine, dysuria, hematuria,  rash, arthralgias, visual complaints, headache, numbness, weakness or ataxia or problems with walking or coordination,  change in mood/affect or memory.          PAST MEDICAL HISTORY :  She  has a past medical history of Endometriosis (0258,5277).  PAST SURGICAL HISTORY: She  has a past surgical history that includes Laparoscopic abdominal exploration (8242,3536); Hip surgery (2013); and Nose surgery (2007).  Allergies  Allergen Reactions  . Codeine Nausea Only       FAMILY HISTORY:  Her indicated that her mother is alive. She indicated that her father is deceased.   SOCIAL HISTORY: She  reports that  has never smoked. she has never used smokeless tobacco. She reports that she does not drink alcohol or use drugs.    SUBJECTIVE:  nad at rest on RA reclined  at 30 degrees HOB    VITAL SIGNS: BP 102/67 (BP Location: Right Arm)   Pulse 74   Temp 98.2 F (36.8 C) (Oral)   Resp 14   Ht 5' 5.5" (1.664 m)   Wt 146 lb (66.2 kg)   SpO2 99%   BMI 23.93 kg/m   HEMODYNAMICS:    VENTILATOR SETTINGS:    INTAKE / OUTPUT: I/O last 3 completed shifts: In: 337.5 [I.V.:337.5] Out: -   PHYSICAL EXAMINATION: General:  Pleasant wf nad  HEENT: nl dentition, turbinates bilaterally, and oropharynx. Nl external ear canals  without cough reflex   NECK :  without JVD/Nodes/TM/ nl carotid upstrokes bilaterally   LUNGS: no acc muscle use,  Nl contour chest which is clear to A and P bilaterally without cough on insp or exp maneuvers   CV:  RRR  no s3 or murmur or increase in P2, and no edema   ABD:  soft and nontender with nl inspiratory excursion in the supine position. No bruits or organomegaly appreciated, bowel sounds nl  MS:  ext warm without deformities, calf tenderness, cyanosis or clubbing No obvious joint restrictions   SKIN: warm and dry without lesions    NEURO:  alert,  approp, nl sensorium with  no motor or cerebellar deficits apparent.      LABS:    Lab Results  Component Value Date   ESRSEDRATE 51 (H) 11/04/2017     BMET Recent Labs  Lab 11/04/17 2231  NA 135  K 3.7  CL 100*  CO2 27  BUN 15  CREATININE 0.68  GLUCOSE 100*    Electrolytes Recent Labs  Lab 11/04/17 2231  CALCIUM 9.5    CBC Recent Labs  Lab 11/04/17 2231  WBC 12.1*  HGB 11.4*  HCT 35.1*  PLT 336    Coag's No results for input(s): APTT, INR in the last 168 hours.  Sepsis Markers No results for input(s): LATICACIDVEN, PROCALCITON, O2SATVEN in the last 168 hours.  ABG No results for input(s): PHART, PCO2ART, PO2ART in the last 168 hours.  Liver Enzymes Recent Labs  Lab 11/04/17 2231  AST 15  ALT 11*  ALKPHOS 60  BILITOT 0.8  ALBUMIN 3.5    Cardiac Enzymes No results for input(s): TROPONINI, PROBNP in the last 168 hours.  Glucose No results for input(s): GLUCAP in the last 168 hours.  Imaging Ct Angio Chest Pe W Or Wo Contrast  Result Date: 11/04/2017 CLINICAL DATA:  41 year old female with newly diagnosed heart murmur. EXAM: CT ANGIOGRAPHY CHEST WITH CONTRAST TECHNIQUE: Multidetector CT imaging of the chest was performed using the standard protocol during bolus administration of intravenous contrast. Multiplanar CT image reconstructions and MIPs were obtained to evaluate the vascular anatomy. CONTRAST:  27mL ISOVUE-370 IOPAMIDOL (ISOVUE-370) INJECTION 76% COMPARISON:  No priors. FINDINGS: Cardiovascular: No filling defect within the pulmonary arterial tree to suggest underlying pulmonary embolism. However, there is profound narrowing of the pulmonary artery is bilaterally to minimum diameter of 4 mm on the left and 6 mm on the right. This profound mural thickening is somewhat mass-like in appearance and contiguous with adjacent soft tissue in the middle mediastinum which extends to the undersurface of the aortic arch. There is also soft tissue  thickening between the right main pulmonary and adjacent ascending thoracic aorta, presumably via their shared adventitia. Notably, there is no other apparent mural thickening of the descending thoracic aorta or great vessels of the mediastinum. Mediastinum/Nodes: Lymphadenopathy noted in the prevascular nodal stations, measuring up to 1.5 cm in short axis. Amorphous soft tissue also noted in the middle mediastinum between the pulmonary artery is an the undersurface of the aortic arch, potentially additional nodal tissue. Esophagus is unremarkable in appearance. No axillary lymphadenopathy. Lungs/Pleura: No suspicious-appearing pulmonary nodules or masses. No acute consolidative airspace disease. No pleural effusions. Upper Abdomen: Unremarkable. Specifically, no mural thickening or luminal narrowing associated with the abdominal aorta or major intraabdominal arteries. Musculoskeletal: There are no aggressive appearing lytic or blastic lesions noted in the visualized portions of the skeleton. Review of the MIP images confirms the above findings.  IMPRESSION: 1. Mass-like thickening of the pulmonary arteries which are severely narrowed bilaterally. This mass-like thickening is contiguous with abnormal soft tissue in the middle mediastinum, and there is some associated prevascular lymphadenopathy is well. Some associated mural thickening of the adjacent portion of the ascending thoracic aorta and undersurface of the thoracic aortic arch. Findings are of uncertain etiology, with differential considerations favoring a large vessel vasculitis such as Takayasu's arteritis (although it is unusual that there is no apparent involvement of the great vessels of the mediastinum or visualized intraabdominal arteries) or lymphoma. Findings are not favored to represent a primary pulmonary sarcoma. 2. No evidence of pulmonary embolism. These results were called by telephone at the time of interpretation on 11/04/2017 at 12:48 pm to  Dr. Ida Rogue, who verbally acknowledged these results. Electronically Signed   By: Vinnie Langton M.D.   On: 11/04/2017 12:48     STUDIES:  Echo 10/23/17 Upper normal LV wall thickness with LVEF 55-60%.normal diastolic   function. Mild calcified aortic annulus. Aortic valve leaflet   excursion is normal. Increased velocities obtained from the   apical view appear to be a combination of mitral and aortic flow   rather than in increased transaortic flow. No definite subaortic   membrane. If cardiac murmur is significant, could consider   further imaging of the aorta (exclude coarctation) or potentially   TEE. Trivial tricuspid regurgitation. Trivial pericardial   effusion.  CTa 11/04/17>>>   Mass-like thickening of the pulmonary arteries which are severely narrowed bilaterally. This mass-like thickening is contiguous with abnormal soft tissue in the middle mediastinum, and there is some associated prevascular lymphadenopathy is well. ACE level 11/21 >>> AFP  11/21 >>> CEA 11/21 >>> CT Abd/pelvis 11/22>>>       ASSESSMENT / PLAN:  Mediastinal mass with NS chronically and now progressive doe with complression of PA's but no symptoms or signs of elevated RV pressures     I strongly suspect lymphoma with B symptoms and much less likely any form of benign granulomatous or fungal process here and issue is getting a prompt dx s putting her at risk of RV decompensation    Discussed with IR (Dr Barbie Banner) and best can do for today is CT abd/pelvis to see if any other nodes are involved/ accessible for bx o/w may consder EBUS 11/23 but risky if need to do gen anesthesia - luckily she is otherwise very healthy with nl lung function and probably will tolerate whatever needs to be done to get tissue dx/ but advised her and husband in this setting want to do the least invasive studies first   Discussed in detail all the  indications, usual  risks and alternatives  relative to the benefits with  patient/husband who agree  to proceed with w/u as outlined.     Total time devoted to counseling  > 50 % of initial 60 min consultation   Christinia Gully, MD Pulmonary and [place] [phone] After 5:30 PM or weekends, use Beeper [phone]

## 2017-11-05 NOTE — Progress Notes (Deleted)
Attempted to give PO tylenol. Pt unable to keep it down and immediately vomited. NP notified and placed orders for IV tylenol. Temp 102.8 at time of tylenol given.   Tressie Ellis, RN

## 2017-11-06 ENCOUNTER — Other Ambulatory Visit: Payer: Self-pay | Admitting: Hematology and Oncology

## 2017-11-06 ENCOUNTER — Telehealth: Payer: Self-pay

## 2017-11-06 ENCOUNTER — Inpatient Hospital Stay (HOSPITAL_COMMUNITY): Payer: BLUE CROSS/BLUE SHIELD

## 2017-11-06 DIAGNOSIS — J9859 Other diseases of mediastinum, not elsewhere classified: Secondary | ICD-10-CM

## 2017-11-06 LAB — ANGIOTENSIN CONVERTING ENZYME: Angiotensin-Converting Enzyme: 50 U/L (ref 14–82)

## 2017-11-06 LAB — HIV ANTIBODY (ROUTINE TESTING W REFLEX): HIV SCREEN 4TH GENERATION: NONREACTIVE

## 2017-11-06 MED ORDER — GADOBENATE DIMEGLUMINE 529 MG/ML IV SOLN
15.0000 mL | Freq: Once | INTRAVENOUS | Status: AC | PRN
  Administered 2017-11-06: 15 mL via INTRAVENOUS

## 2017-11-06 NOTE — Progress Notes (Signed)
Transported to nuclear med . for MRA of the chest by wheelchair.

## 2017-11-06 NOTE — Progress Notes (Addendum)
[facility]  Went back, talked to family about plan, explained that we would like to have a PET scan. They are anxious to have a biopsy sooner rather than later.  They understand the need for PET but are anxious to have a biopsy and proceed with treatment based on their understanding of the mass.  I reassured them that this process should not be rushed to make sure that  perform the right procedure as safely as possible.  Roselie Awkward, MD [place] [facility] Pager: [phone] Cell: [phone] After 3pm or if no response, call [phone]

## 2017-11-06 NOTE — Telephone Encounter (Signed)
Called pt and scheduled for lab 1145 on 11/09/17. Orders placed for labs. Pt to see Dr. Lebron Conners at 1200. High priority message sent to Audie Clear for approval, d/t inability to schedule MD appt on 11/26.

## 2017-11-06 NOTE — Progress Notes (Signed)
Back from MRA by wheelchir awake and alert.

## 2017-11-06 NOTE — Progress Notes (Signed)
Discharged home, accompanied by mother, discharge instructions given to pt. Belongings taken home.

## 2017-11-06 NOTE — Discharge Summary (Signed)
Physician Discharge Summary  Kara Anderson LNL:[id number] DOB: Aug 20, 1976 DOA: 11/04/2017  PCP: Doree Albee, MD  Admit date: 11/04/2017 Discharge date: 11/06/2017  Time spent: > 35 minutes  Recommendations for Outpatient Follow-up:  1. Ensure patient obtains PET scan (order placed) 2. Pt needs to follow up with oncologist on Monday 3. Pt also needs to follow up with Cardiothoracic surgeon   Discharge Diagnoses:  Active Problems:   Mediastinal mass   Pulmonary stenosis   Discharge Condition: stabld  Diet recommendation: regular diet  Filed Weights   11/04/17 2359 11/05/17 0602 11/06/17 0149  Weight: 66.5 kg (146 lb 9.7 oz) 66.2 kg (146 lb) 67.7 kg (149 lb 4.8 oz)    History of present illness:  41 y.o. female, w c/o increasing dyspnea for the past 67months, Sent by pcp to have cardiac echo. And was sent for CT chest which showed mediastinal mass.  Hospital Course:  Active Problems:   Mediastinal mass/ Pulmonary stenosis - Pt was seen by oncology, pulmonology, and case discussed with cardiothoracic surgeon. Plan is for PET scan and then cardiothoracic surgery evaluation, although oncology would like to see patient on Monday after discharge.  - discussed with patient who understands follow up plans with both the oncologist and the surgeon. - oncology favoring lymphoma  Procedures:  none  Consultations:  pulmonology  Discharge Exam: Vitals:   11/06/17 1518 11/06/17 1600  BP:    Pulse:    Resp:  17  Temp: 98.3 F (36.8 C)   SpO2:  100%    General: pt in nad, alert and awake Cardiovascular: rrr, no rubs, gallops Respiratory: CTA BL, no wheezes   Discharge Instructions   Discharge Instructions    Diet - low sodium heart healthy   Complete by:  As directed    Discharge instructions   Complete by:  As directed    Please ensure patient follows up with oncology on Monday. Needs to get PET scan as outpatient and follow up with cardiothoracic surgeon    Increase activity slowly   Complete by:  As directed      Current Discharge Medication List    CONTINUE these medications which have NOT CHANGED   Details  aspirin 81 MG chewable tablet Chew 81 mg daily by mouth.    cetirizine (ZYRTEC) 10 MG tablet Take 10 mg by mouth daily.    cholecalciferol (VITAMIN D) 1000 units tablet Take daily by mouth.    Cyanocobalamin (VITAMIN B 12 PO) Take 3,000 mcg by mouth daily.     montelukast (SINGULAIR) 10 MG tablet Take 10 mg daily by mouth. Refills: 2    Multiple Vitamins-Minerals (MULTIVITAMIN GUMMIES WOMENS) CHEW Chew by mouth 2 (two) times daily.    sodium chloride (OCEAN) 0.65 % SOLN nasal spray Place 1 spray into both nostrils as needed for congestion.       Allergies  Allergen Reactions  . Codeine Nausea Only      The results of significant diagnostics from this hospitalization (including imaging, microbiology, ancillary and laboratory) are listed below for reference.    Significant Diagnostic Studies: Ct Angio Chest Pe W Or Wo Contrast  Result Date: 11/04/2017 CLINICAL DATA:  41 year old female with newly diagnosed heart murmur. EXAM: CT ANGIOGRAPHY CHEST WITH CONTRAST TECHNIQUE: Multidetector CT imaging of the chest was performed using the standard protocol during bolus administration of intravenous contrast. Multiplanar CT image reconstructions and MIPs were obtained to evaluate the vascular anatomy. CONTRAST:  58mL ISOVUE-370 IOPAMIDOL (ISOVUE-370) INJECTION 76% COMPARISON:  No priors. FINDINGS: Cardiovascular: No filling defect within the pulmonary arterial tree to suggest underlying pulmonary embolism. However, there is profound narrowing of the pulmonary artery is bilaterally to minimum diameter of 4 mm on the left and 6 mm on the right. This profound mural thickening is somewhat mass-like in appearance and contiguous with adjacent soft tissue in the middle mediastinum which extends to the undersurface of the aortic arch. There is  also soft tissue thickening between the right main pulmonary and adjacent ascending thoracic aorta, presumably via their shared adventitia. Notably, there is no other apparent mural thickening of the descending thoracic aorta or great vessels of the mediastinum. Mediastinum/Nodes: Lymphadenopathy noted in the prevascular nodal stations, measuring up to 1.5 cm in short axis. Amorphous soft tissue also noted in the middle mediastinum between the pulmonary artery is an the undersurface of the aortic arch, potentially additional nodal tissue. Esophagus is unremarkable in appearance. No axillary lymphadenopathy. Lungs/Pleura: No suspicious-appearing pulmonary nodules or masses. No acute consolidative airspace disease. No pleural effusions. Upper Abdomen: Unremarkable. Specifically, no mural thickening or luminal narrowing associated with the abdominal aorta or major intraabdominal arteries. Musculoskeletal: There are no aggressive appearing lytic or blastic lesions noted in the visualized portions of the skeleton. Review of the MIP images confirms the above findings. IMPRESSION: 1. Mass-like thickening of the pulmonary arteries which are severely narrowed bilaterally. This mass-like thickening is contiguous with abnormal soft tissue in the middle mediastinum, and there is some associated prevascular lymphadenopathy is well. Some associated mural thickening of the adjacent portion of the ascending thoracic aorta and undersurface of the thoracic aortic arch. Findings are of uncertain etiology, with differential considerations favoring a large vessel vasculitis such as Takayasu's arteritis (although it is unusual that there is no apparent involvement of the great vessels of the mediastinum or visualized intraabdominal arteries) or lymphoma. Findings are not favored to represent a primary pulmonary sarcoma. 2. No evidence of pulmonary embolism. These results were called by telephone at the time of interpretation on  11/04/2017 at 12:48 pm to Dr. Ida Rogue, who verbally acknowledged these results. Electronically Signed   By: Vinnie Langton M.D.   On: 11/04/2017 12:48   Ct Abdomen Pelvis W Contrast  Result Date: 11/05/2017 CLINICAL DATA:  Chest CT demonstrating abnormal appearance of the mediastinum. Increasing dyspnea for 2 months. EXAM: CT ABDOMEN AND PELVIS WITH CONTRAST TECHNIQUE: Multidetector CT imaging of the abdomen and pelvis was performed using the standard protocol following bolus administration of intravenous contrast. CONTRAST:  147mL ISOVUE-300 IOPAMIDOL (ISOVUE-300) INJECTION 61% COMPARISON:  Chest CT of 11/04/2017. FINDINGS: Lower chest: 3 mm subpleural right lower lobe pulmonary nodule on image 1/series 5 is favored to represent a subpleural lymph node. Normal heart size without pericardial or pleural effusion. Hepatobiliary: Hepatomegaly at 19.3 cm. Normal gallbladder, without biliary ductal dilatation. Pancreas: Normal, without mass or ductal dilatation. Spleen: Normal in size, without focal abnormality. Adrenals/Urinary Tract: Normal adrenal glands. Normal kidneys, without hydronephrosis. Normal urinary bladder. Stomach/Bowel: Normal stomach, without wall thickening. Colonic stool burden suggests constipation. Cecum extends into the central upper pelvis. Normal terminal ileum. Appendix likely identified and normal in the upper central pelvis on image 50/series 3. Normal small bowel. Vascular/Lymphatic: Normal caliber of the aorta and branch vessels. No evidence of vasculitis. No abdominopelvic adenopathy. Reproductive: Normal uterus. Right ovarian corpus luteal cyst of 1.7 cm on image 63/series 3. Other: Trace free pelvic fluid is likely physiologic. Musculoskeletal: Probable bone islands in the left femoral head and acetabulum. Transitional S1  vertebral body. IMPRESSION: 1. No evidence of vasculitis or adenopathy within the abdomen or pelvis. No acute findings. 2. Right ovarian corpus luteal cyst.  3.  Possible constipation. 4. Hepatomegaly. Electronically Signed   By: Abigail Miyamoto M.D.   On: 11/05/2017 13:04   Mr Jodene Nam Chest W Wo Contrast  Result Date: 11/06/2017 CLINICAL DATA:  Shortness of breath, central pulmonary artery stenoses bilaterally by CTA with evidence of wall thickening and soft tissue prominence extending into the mediastinum. Diminished upper extremity pulses. EXAM: MRA CHEST WITH OR WITHOUT CONTRAST TECHNIQUE: Prior to MRA sequences, preliminary unenhanced sequences were obtained through the chest. Angiographic images of the chest were obtained using MRA technique with intravenous contrast. CONTRAST:  44mL MULTIHANCE GADOBENATE DIMEGLUMINE 529 MG/ML IV SOLN COMPARISON:  CTA of the chest on 11/04/2017 FINDINGS: VASCULAR Aorta: The aorta shows normal patency without evidence of stenosis or significant atherosclerosis. There is suggestion of wall thickening involving the aortic root and ascending thoracic aorta up to the level of the proximal arch. Findings are suggestive of potential large vessel vasculitis such as Takayasu's arteritis. No great vessel involvement is identified with normally patent proximal great vessels identified. On the MRA sequences, there appears to be normal patency of the innominate artery, proximal left common carotid artery, bilateral subclavian arteries and bilateral axillary arteries. Heart: Normal heart size.  No pericardial fluid identified. Pulmonary Arteries: Severe stenoses of bilateral pulmonary artery is again noted centrally with associated wall thickening and soft tissue prominence extending into the mediastinum. Findings are suggestive of large vessel vasculitis/ arteritis. Other: Venous structures are normally patent including the SVC and central veins. NON-VASCULAR Soft tissue in the mediastinum appears contiguous with soft tissue thickening involving the central pulmonary artery is an the proximal aorta as present on CTA. Associated mildly enlarged  prevascular lymph nodes adjacent to the aortic arch. No anterior mediastinal mass identified. 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 Soft tissue prominence in the mediastinum as well as mildly enlarged prevascular mediastinal lymph nodes may be related to vasculitis and do not necessarily reflect a neoplastic process. Atypical infectious process is felt to be less likely. A PET scan may be help for further evaluation but may also lead to some potential false positive activity due to acute inflammation. There is no evidence of great vessel involvement by vasculitis by MRI/MRA. Electronically Signed   By: Aletta Edouard M.D.   On: 11/06/2017 11:47    Microbiology: Recent Results (from the past 240 hour(s))  MRSA PCR Screening     Status: None   Collection Time: 11/04/17 11:27 PM  Result Value Ref Range Status   MRSA by PCR NEGATIVE NEGATIVE Final    Comment:        The GeneXpert MRSA Assay (FDA approved for NASAL specimens only), is one component of a comprehensive MRSA colonization surveillance program. It is not intended to diagnose MRSA infection nor to guide or monitor treatment for MRSA infections.      Labs: Basic Metabolic Panel: Recent Labs  Lab 11/04/17 2231  NA 135  K 3.7  CL 100*  CO2 27  GLUCOSE 100*  BUN 15  CREATININE 0.68  CALCIUM 9.5   Liver Function Tests: Recent Labs  Lab 11/04/17 2231  AST 15  ALT 11*  ALKPHOS 60  BILITOT 0.8  PROT 8.1  ALBUMIN 3.5   No results for input(s): LIPASE, AMYLASE  in the last 168 hours. No results for input(s): AMMONIA in the last 168 hours. CBC: Recent Labs  Lab 11/04/17 2231  WBC 12.1*  HGB 11.4*  HCT 35.1*  MCV 84.6  PLT 336   Cardiac Enzymes: No results for input(s): CKTOTAL, CKMB, CKMBINDEX, TROPONINI in the last 168 hours. BNP: BNP (last 3 results) No results  for input(s): BNP in the last 8760 hours.  ProBNP (last 3 results) No results for input(s): PROBNP in the last 8760 hours.  CBG: No results for input(s): GLUCAP in the last 168 hours.     Signed:  Velvet Bathe MD.  [facility] 11/06/2017, 4:40 PM

## 2017-11-06 NOTE — Progress Notes (Addendum)
PULMONARY / CRITICAL CARE MEDICINE   Name: Kara Anderson MRN: [id number] DOB: 09-07-1976    ADMISSION DATE:  11/04/2017 CONSULTATION DATE:  11/05/17  REFERRING MD:  Dr Kim/ [facility]   CHIEF COMPLAINT:  Sob / med mass  BRIEF:  41 y/o female non-smoker admitted with a mediastinal mass.      SUBJECTIVE:  Feels OK Anxious about next steps   VITAL SIGNS: BP 98/64 (BP Location: Right Arm)   Pulse 63   Temp 98.6 F (37 C) (Oral)   Resp 16   Ht 5' 5.5" (1.664 m)   Wt 149 lb 4.8 oz (67.7 kg)   SpO2 98%   BMI 24.47 kg/m   HEMODYNAMICS:    VENTILATOR SETTINGS:    INTAKE / OUTPUT: I/O last 3 completed shifts: In: 1597.5 [P.O.:960; I.V.:637.5] Out: 1800 [Urine:1800]  PHYSICAL EXAMINATION:  General:  Resting comfortably in bed HENT: NCAT OP clear PULM: CTA B, normal effort CV: RRR, no mgr GI: BS+, soft, nontender MSK: normal bulk and tone Neuro: awake, alert, no distress, MAEW      LABS:    Lab Results  Component Value Date   ESRSEDRATE 51 (H) 11/04/2017     BMET Recent Labs  Lab 11/04/17 2231  NA 135  K 3.7  CL 100*  CO2 27  BUN 15  CREATININE 0.68  GLUCOSE 100*    Electrolytes Recent Labs  Lab 11/04/17 2231  CALCIUM 9.5    CBC Recent Labs  Lab 11/04/17 2231  WBC 12.1*  HGB 11.4*  HCT 35.1*  PLT 336    Coag's No results for input(s): APTT, INR in the last 168 hours.  Sepsis Markers No results for input(s): LATICACIDVEN, PROCALCITON, O2SATVEN in the last 168 hours.  ABG No results for input(s): PHART, PCO2ART, PO2ART in the last 168 hours.  Liver Enzymes Recent Labs  Lab 11/04/17 2231  AST 15  ALT 11*  ALKPHOS 60  BILITOT 0.8  ALBUMIN 3.5    Cardiac Enzymes No results for input(s): TROPONINI, PROBNP in the last 168 hours.  Glucose No results for input(s): GLUCAP in the last 168 hours.  Imaging Ct Abdomen Pelvis W Contrast  Result Date: 11/05/2017 CLINICAL DATA:  Chest CT demonstrating abnormal appearance of  the mediastinum. Increasing dyspnea for 2 months. EXAM: CT ABDOMEN AND PELVIS WITH CONTRAST TECHNIQUE: Multidetector CT imaging of the abdomen and pelvis was performed using the standard protocol following bolus administration of intravenous contrast. CONTRAST:  166mL ISOVUE-300 IOPAMIDOL (ISOVUE-300) INJECTION 61% COMPARISON:  Chest CT of 11/04/2017. FINDINGS: Lower chest: 3 mm subpleural right lower lobe pulmonary nodule on image 1/series 5 is favored to represent a subpleural lymph node. Normal heart size without pericardial or pleural effusion. Hepatobiliary: Hepatomegaly at 19.3 cm. Normal gallbladder, without biliary ductal dilatation. Pancreas: Normal, without mass or ductal dilatation. Spleen: Normal in size, without focal abnormality. Adrenals/Urinary Tract: Normal adrenal glands. Normal kidneys, without hydronephrosis. Normal urinary bladder. Stomach/Bowel: Normal stomach, without wall thickening. Colonic stool burden suggests constipation. Cecum extends into the central upper pelvis. Normal terminal ileum. Appendix likely identified and normal in the upper central pelvis on image 50/series 3. Normal small bowel. Vascular/Lymphatic: Normal caliber of the aorta and branch vessels. No evidence of vasculitis. No abdominopelvic adenopathy. Reproductive: Normal uterus. Right ovarian corpus luteal cyst of 1.7 cm on image 63/series 3. Other: Trace free pelvic fluid is likely physiologic. Musculoskeletal: Probable bone islands in the left femoral head and acetabulum. Transitional S1 vertebral body. IMPRESSION: 1. No evidence  of vasculitis or adenopathy within the abdomen or pelvis. No acute findings. 2. Right ovarian corpus luteal cyst. 3.  Possible constipation. 4. Hepatomegaly. Electronically Signed   By: Abigail Miyamoto M.D.   On: 11/05/2017 13:04     STUDIES:  Echo 10/23/17 Upper normal LV wall thickness with LVEF 55-60%.normal diastolic   function. Mild calcified aortic annulus. Aortic valve leaflet    excursion is normal. Increased velocities obtained from the   apical view appear to be a combination of mitral and aortic flow   rather than in increased transaortic flow. No definite subaortic   membrane. If cardiac murmur is significant, could consider   further imaging of the aorta (exclude coarctation) or potentially   TEE. Trivial tricuspid regurgitation. Trivial pericardial   effusion.  CTa 11/04/17>>>   Mass-like thickening of the pulmonary arteries which are severely narrowed bilaterally. This mass-like thickening is contiguous with abnormal soft tissue in the middle mediastinum, and there is some associated prevascular lymphadenopathy is well. ACE level 11/21 >>> AFP  11/21 >>> CEA 11/21 >>> CT Abd/pelvis 11/22>>> no adenopathy noted, some hepatomegaly noted, R ovarium corpus luteal cyst, hepatomegaly       ASSESSMENT / PLAN:  Mediastinal mass which encompasses her pulmonary arteries, associated lymph node.  I have discussed with cardiothoracic surgery and the best approach would be to perform a chamberlain procedure to access the enlarged mediastinal lymph node.  They also recommend a PET scan prior to the procedure to look for extrathoracic tissue which may be abnormal and potentially amenable to biopsy.  This is reasonable as it may allow [place] to perform a lower risk procedure if something is found outside the thorax.  Would perform RHC or at least a swan prior to performing general anesthesia given her history of presyncope and hepatomegaly> elevated right heart pressures not seen on CT?  > 50% of this 40 minute visit spent face to face  Roselie Awkward, MD [facility] Pager: [phone] Cell: [phone] After 3pm or if no response, call [phone]

## 2017-11-06 NOTE — Consult Note (Signed)
New Hematology/Oncology Consult   Referral MD: Velvet Bathe, MD    Reason for Referral: Mediastinal mass with pulmonary artery compression.  HPI: Kara Anderson is a 41 y.o. female Who was admitted For evaluation of progressive shortness of breath over the past two months including intermittent shortness of breath At the rest.Echocardiogram obtained on 10/23/17 was essentially normal, CTA of the chest obtained on 11/04/17 Demonstrated no evidence of pulmonary emboli, but showed severe narrowing of bilateral pulmonary arteries with associated thickening of the blood vessel walls as well as a soft tissue mass in the central mediastinum as well as associated lymphadenopathy in the pre-vascular station measuring up to 1.5 cm short axis. CT demonstrated no supraclavicular or excellent for than apathy, no skeletal lesions. Additional assessment with CT of the abdomen and pelvis demonstrated no evidence of retroperitoneal, pelvic, or inguinal lymphadenopathy and no skeletal lesions. MRI of the chestWas supportive of larger vessel vasculitis/arthritis with Perivascular soft tissue And lymphadenopathy attributed to the inflammatory process.  Prior to admission, patient reports weight loss, increasing fatigue, and decreased activity tolerance to defer some breath. She denies active chest pain, palpitations, or cough. Denies dysphasia, nausea, vomiting, abdominal pain, diarrhea, or constipation. No visual deficits, no new focal weakness or sensory deficits in the extremities or face. No dysuria, hematuria. No musculoskeletal complaints and no genitourinary complaints.    Past Medical History:  Diagnosis Date  . Endometriosis 2007,2009  :  Past Surgical History:  Procedure Laterality Date  . HIP SURGERY  2013  . LAPAROSCOPIC ABDOMINAL EXPLORATION  2007,2009   endometriosis  . NOSE SURGERY  2007  :   Current Facility-Administered Medications:  .  acetaminophen (TYLENOL) tablet 650 mg, 650 mg, Oral,  Q6H PRN, 650 mg at 11/05/17 2241 **OR** acetaminophen (TYLENOL) suppository 650 mg, 650 mg, Rectal, Q6H PRN, Jani Gravel, MD .  ondansetron [facility]) injection 4 mg, 4 mg, Intravenous, Q6H PRN, Jani Gravel, MD, 4 mg at 11/05/17 2241:  :  Allergies  Allergen Reactions  . Codeine Nausea Only  :   Family History  Problem Relation Age of Onset  . Heart disease Mother   . Stroke Mother   . Diabetes Mother   . Heart disease Father   . Alcohol abuse Father    Social History   Socioeconomic History  . Marital status: Married    Spouse name: Not on file  . Number of children: Not on file  . Years of education: Not on file  . Highest education level: Not on file  Social Needs  . Financial resource strain: Not on file  . Food insecurity - worry: Not on file  . Food insecurity - inability: Not on file  . Transportation needs - medical: Not on file  . Transportation needs - non-medical: Not on file  Occupational History  . Not on file  Tobacco Use  . Smoking status: Never Smoker  . Smokeless tobacco: Never Used  Substance and Sexual Activity  . Alcohol use: No    Alcohol/week: 0.0 oz  . Drug use: No  . Sexual activity: Not on file  Other Topics Concern  . Not on file  Social History Narrative  . Not on file    Review of Systems: AS per HPI, all other systems are negative  Physical Exam:  Blood pressure 118/67, pulse 77, temperature 98.2 F (36.8 C), temperature source Oral, resp. rate 20, height 5' 5.5" (1.664 m), weight 149 lb 4.8 oz (67.7 kg), SpO2 100 %.  Patient  is alert, Awake,oriented times three HEENT: No proptosis, no cervical venous distention, mild facial flushing noted Lungs: Clear to auscultation bilaterally Cardiac: S1/S2, regular. Systolic murmur appreciated over entire precordium Abdomen: Soft, nontender, nondistended. No hepatosplenomegaly Vascular: No abnormal vascular patterns Lymph nodes:  No palpable Lymphadenopathy Neurologic: No gross focal  neurological deficits Skin: No rash, petechiae or ecchymosis Musculoskeletal: Unremarkable  LABS:  Recent Labs    11/04/17 2231  WBC 12.1*  HGB 11.4*  HCT 35.1*  PLT 336    Recent Labs    11/04/17 2231  NA 135  K 3.7  CL 100*  CO2 27  GLUCOSE 100*  BUN 15  CREATININE 0.68  CALCIUM 9.5      RADIOLOGY:  Ct Angio Chest Pe W Or Wo Contrast  Result Date: 11/04/2017 CLINICAL DATA:  41 year old female with newly diagnosed heart murmur. EXAM: CT ANGIOGRAPHY CHEST WITH CONTRAST TECHNIQUE: Multidetector CT imaging of the chest was performed using the standard protocol during bolus administration of intravenous contrast. Multiplanar CT image reconstructions and MIPs were obtained to evaluate the vascular anatomy. CONTRAST:  60mL ISOVUE-370 IOPAMIDOL (ISOVUE-370) INJECTION 76% COMPARISON:  No priors. FINDINGS: Cardiovascular: No filling defect within the pulmonary arterial tree to suggest underlying pulmonary embolism. However, there is profound narrowing of the pulmonary artery is bilaterally to minimum diameter of 4 mm on the left and 6 mm on the right. This profound mural thickening is somewhat mass-like in appearance and contiguous with adjacent soft tissue in the middle mediastinum which extends to the undersurface of the aortic arch. There is also soft tissue thickening between the right main pulmonary and adjacent ascending thoracic aorta, presumably via their shared adventitia. Notably, there is no other apparent mural thickening of the descending thoracic aorta or great vessels of the mediastinum. Mediastinum/Nodes: Lymphadenopathy noted in the prevascular nodal stations, measuring up to 1.5 cm in short axis. Amorphous soft tissue also noted in the middle mediastinum between the pulmonary artery is an the undersurface of the aortic arch, potentially additional nodal tissue. Esophagus is unremarkable in appearance. No axillary lymphadenopathy. Lungs/Pleura: No suspicious-appearing  pulmonary nodules or masses. No acute consolidative airspace disease. No pleural effusions. Upper Abdomen: Unremarkable. Specifically, no mural thickening or luminal narrowing associated with the abdominal aorta or major intraabdominal arteries. Musculoskeletal: There are no aggressive appearing lytic or blastic lesions noted in the visualized portions of the skeleton. Review of the MIP images confirms the above findings. IMPRESSION: 1. Mass-like thickening of the pulmonary arteries which are severely narrowed bilaterally. This mass-like thickening is contiguous with abnormal soft tissue in the middle mediastinum, and there is some associated prevascular lymphadenopathy is well. Some associated mural thickening of the adjacent portion of the ascending thoracic aorta and undersurface of the thoracic aortic arch. Findings are of uncertain etiology, with differential considerations favoring a large vessel vasculitis such as Takayasu's arteritis (although it is unusual that there is no apparent involvement of the great vessels of the mediastinum or visualized intraabdominal arteries) or lymphoma. Findings are not favored to represent a primary pulmonary sarcoma. 2. No evidence of pulmonary embolism. These results were called by telephone at the time of interpretation on 11/04/2017 at 12:48 pm to Dr. Ida Rogue, who verbally acknowledged these results. Electronically Signed   By: Vinnie Langton M.D.   On: 11/04/2017 12:48   Ct Abdomen Pelvis W Contrast  Result Date: 11/05/2017 CLINICAL DATA:  Chest CT demonstrating abnormal appearance of the mediastinum. Increasing dyspnea for 2 months. EXAM: CT ABDOMEN AND PELVIS WITH  CONTRAST TECHNIQUE: Multidetector CT imaging of the abdomen and pelvis was performed using the standard protocol following bolus administration of intravenous contrast. CONTRAST:  183mL ISOVUE-300 IOPAMIDOL (ISOVUE-300) INJECTION 61% COMPARISON:  Chest CT of 11/04/2017. FINDINGS: Lower chest: 3  mm subpleural right lower lobe pulmonary nodule on image 1/series 5 is favored to represent a subpleural lymph node. Normal heart size without pericardial or pleural effusion. Hepatobiliary: Hepatomegaly at 19.3 cm. Normal gallbladder, without biliary ductal dilatation. Pancreas: Normal, without mass or ductal dilatation. Spleen: Normal in size, without focal abnormality. Adrenals/Urinary Tract: Normal adrenal glands. Normal kidneys, without hydronephrosis. Normal urinary bladder. Stomach/Bowel: Normal stomach, without wall thickening. Colonic stool burden suggests constipation. Cecum extends into the central upper pelvis. Normal terminal ileum. Appendix likely identified and normal in the upper central pelvis on image 50/series 3. Normal small bowel. Vascular/Lymphatic: Normal caliber of the aorta and branch vessels. No evidence of vasculitis. No abdominopelvic adenopathy. Reproductive: Normal uterus. Right ovarian corpus luteal cyst of 1.7 cm on image 63/series 3. Other: Trace free pelvic fluid is likely physiologic. Musculoskeletal: Probable bone islands in the left femoral head and acetabulum. Transitional S1 vertebral body. IMPRESSION: 1. No evidence of vasculitis or adenopathy within the abdomen or pelvis. No acute findings. 2. Right ovarian corpus luteal cyst. 3.  Possible constipation. 4. Hepatomegaly. Electronically Signed   By: Abigail Miyamoto M.D.   On: 11/05/2017 13:04   Mr Jodene Nam Chest W Wo Contrast  Result Date: 11/06/2017 CLINICAL DATA:  Shortness of breath, central pulmonary artery stenoses bilaterally by CTA with evidence of wall thickening and soft tissue prominence extending into the mediastinum. Diminished upper extremity pulses. EXAM: MRA CHEST WITH OR WITHOUT CONTRAST TECHNIQUE: Prior to MRA sequences, preliminary unenhanced sequences were obtained through the chest. Angiographic images of the chest were obtained using MRA technique with intravenous contrast. CONTRAST:  58mL MULTIHANCE  GADOBENATE DIMEGLUMINE 529 MG/ML IV SOLN COMPARISON:  CTA of the chest on 11/04/2017 FINDINGS: VASCULAR Aorta: The aorta shows normal patency without evidence of stenosis or significant atherosclerosis. There is suggestion of wall thickening involving the aortic root and ascending thoracic aorta up to the level of the proximal arch. Findings are suggestive of potential large vessel vasculitis such as Takayasu's arteritis. No great vessel involvement is identified with normally patent proximal great vessels identified. On the MRA sequences, there appears to be normal patency of the innominate artery, proximal left common carotid artery, bilateral subclavian arteries and bilateral axillary arteries. Heart: Normal heart size.  No pericardial fluid identified. Pulmonary Arteries: Severe stenoses of bilateral pulmonary artery is again noted centrally with associated wall thickening and soft tissue prominence extending into the mediastinum. Findings are suggestive of large vessel vasculitis/ arteritis. Other: Venous structures are normally patent including the SVC and central veins. NON-VASCULAR Soft tissue in the mediastinum appears contiguous with soft tissue thickening involving the central pulmonary artery is an the proximal aorta as present on CTA. Associated mildly enlarged prevascular lymph nodes adjacent to the aortic arch. No anterior mediastinal mass identified. 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 Soft tissue prominence in the mediastinum as well as mildly enlarged prevascular mediastinal lymph nodes may be related to vasculitis and do not necessarily reflect a neoplastic process. Atypical infectious process is felt to be less likely. A PET scan may be help for further evaluation but may also lead to some potential false positive activity due to  acute inflammation.  There is no evidence of great vessel involvement by vasculitis by MRI/MRA. Electronically Signed   By: Aletta Edouard M.D.   On: 11/06/2017 11:47    Assessment and Plan:  41 y.o. female presenting with progressive dyspnea with exertion and intermittent shortens of breath at rest found to have stenosis of bilateral pulmonary arteries either due to vasculitis or infiltration/external compression by mediastinal mass. Differential diagnosis includes various vasculitides versus malignant process such as high school lymphoma, primary mediastinal lymphoma, intravascular B-cell lymphoma and non-lymphoproliferative neoplasms such as angiosarcoma, leiomyosarcoma etc. Ultimately, tissue biopsy will be required to confirm the diagnosis. Patient has been evaluated by Dr Roxy Manns from Cardiothoracic Surgery and at the present time, noisy biopsy site is available based on the appearance of available imaging. With that in mind, the consensus recommendation for the patient is to obtain additional imaging with PET/CT. Patient is currently hemodynamically stable, there are no significant mythological or physiological aberrations in her physical exam and lab work.  --Agree with discharge home. Patient instructed to return to the emergency room if progressive shortness of breath, lightheadedness, dizziness, or syncopal event occur --I plan on seeing her in my clinic on 11/09/17 -- For clinical and hematological monitoring --I have ordered PET/CT to be obtained later next week to facilitate assessment for the optimal biopsy site   Ardath Sax, MD 11/06/2017, 3:03 PM

## 2017-11-07 LAB — AFP TUMOR MARKER: AFP, Serum, Tumor Marker: 1.6 ng/mL (ref 0.0–8.3)

## 2017-11-07 LAB — CEA: CEA: 0.8 ng/mL (ref 0.0–4.7)

## 2017-11-09 ENCOUNTER — Other Ambulatory Visit (HOSPITAL_BASED_OUTPATIENT_CLINIC_OR_DEPARTMENT_OTHER): Payer: BLUE CROSS/BLUE SHIELD

## 2017-11-09 ENCOUNTER — Encounter: Payer: Self-pay | Admitting: Hematology and Oncology

## 2017-11-09 ENCOUNTER — Telehealth: Payer: Self-pay

## 2017-11-09 ENCOUNTER — Ambulatory Visit (HOSPITAL_BASED_OUTPATIENT_CLINIC_OR_DEPARTMENT_OTHER): Payer: BLUE CROSS/BLUE SHIELD | Admitting: Hematology and Oncology

## 2017-11-09 ENCOUNTER — Telehealth: Payer: Self-pay | Admitting: Hematology and Oncology

## 2017-11-09 VITALS — BP 106/46 | HR 64 | Temp 98.1°F | Resp 16

## 2017-11-09 DIAGNOSIS — M549 Dorsalgia, unspecified: Secondary | ICD-10-CM | POA: Diagnosis not present

## 2017-11-09 DIAGNOSIS — J9859 Other diseases of mediastinum, not elsewhere classified: Secondary | ICD-10-CM | POA: Diagnosis not present

## 2017-11-09 DIAGNOSIS — R0600 Dyspnea, unspecified: Secondary | ICD-10-CM | POA: Diagnosis not present

## 2017-11-09 DIAGNOSIS — N809 Endometriosis, unspecified: Secondary | ICD-10-CM | POA: Diagnosis not present

## 2017-11-09 LAB — CBC WITH DIFFERENTIAL/PLATELET
BASO%: 0.6 % (ref 0.0–2.0)
BASOS ABS: 0 10*3/uL (ref 0.0–0.1)
EOS ABS: 0.2 10*3/uL (ref 0.0–0.5)
EOS%: 2.6 % (ref 0.0–7.0)
HCT: 36.1 % (ref 34.8–46.6)
HEMOGLOBIN: 11.6 g/dL (ref 11.6–15.9)
LYMPH%: 25.2 % (ref 14.0–49.7)
MCH: 27.7 pg (ref 25.1–34.0)
MCHC: 32.1 g/dL (ref 31.5–36.0)
MCV: 86.2 fL (ref 79.5–101.0)
MONO#: 0.4 10*3/uL (ref 0.1–0.9)
MONO%: 6.1 % (ref 0.0–14.0)
NEUT%: 65.5 % (ref 38.4–76.8)
NEUTROS ABS: 4.7 10*3/uL (ref 1.5–6.5)
PLATELETS: 331 10*3/uL (ref 145–400)
RBC: 4.19 10*6/uL (ref 3.70–5.45)
RDW: 14.3 % (ref 11.2–14.5)
WBC: 7.2 10*3/uL (ref 3.9–10.3)
lymph#: 1.8 10*3/uL (ref 0.9–3.3)

## 2017-11-09 LAB — COMPREHENSIVE METABOLIC PANEL
ALT: 9 U/L (ref 0–55)
AST: 14 U/L (ref 5–34)
Albumin: 3.6 g/dL (ref 3.5–5.0)
Alkaline Phosphatase: 67 U/L (ref 40–150)
Anion Gap: 8 mEq/L (ref 3–11)
BUN: 13.7 mg/dL (ref 7.0–26.0)
CALCIUM: 9.8 mg/dL (ref 8.4–10.4)
CHLORIDE: 101 meq/L (ref 98–109)
CO2: 27 meq/L (ref 22–29)
Creatinine: 0.8 mg/dL (ref 0.6–1.1)
EGFR: >60 mL/min/{1.73_m2} (ref >60)
Glucose: 73 mg/dl (ref 70–140)
POTASSIUM: 4.3 meq/L (ref 3.5–5.1)
Sodium: 137 mEq/L (ref 136–145)
Total Bilirubin: 0.33 mg/dL (ref 0.20–1.20)
Total Protein: 8.6 g/dL — ABNORMAL HIGH (ref 6.4–8.3)

## 2017-11-09 LAB — URIC ACID: URIC ACID, SERUM: 3.5 mg/dL (ref 2.6–7.4)

## 2017-11-09 LAB — LACTATE DEHYDROGENASE: LDH: 110 U/L — AB (ref 125–245)

## 2017-11-09 NOTE — Telephone Encounter (Signed)
Scheduled appt per 11/26 los - per [facility] , they will contact patient with appt. Gave patient AVS and calender per los.

## 2017-11-09 NOTE — Telephone Encounter (Signed)
Patient aware of 8am PET scan appointment on Friday 11/13/17 at [facility]. Patient aware of NPO after midnight and to arrive at the [facility] entrance.

## 2017-11-10 ENCOUNTER — Ambulatory Visit: Payer: BLUE CROSS/BLUE SHIELD | Admitting: Hematology and Oncology

## 2017-11-10 ENCOUNTER — Ambulatory Visit (HOSPITAL_COMMUNITY): Payer: BLUE CROSS/BLUE SHIELD

## 2017-11-10 LAB — C-REACTIVE PROTEIN: CRP: 19.4 mg/L — AB (ref 0.0–4.9)

## 2017-11-10 LAB — SEDIMENTATION RATE: Sedimentation Rate-Westergren: 40 mm/hr — ABNORMAL HIGH (ref 0–32)

## 2017-11-10 LAB — BETA 2 MICROGLOBULIN, SERUM: Beta-2: 1.4 mg/L (ref 0.6–2.4)

## 2017-11-11 ENCOUNTER — Telehealth: Payer: Self-pay | Admitting: *Deleted

## 2017-11-11 ENCOUNTER — Telehealth: Payer: Self-pay

## 2017-11-11 ENCOUNTER — Telehealth: Payer: Self-pay | Admitting: Internal Medicine

## 2017-11-11 LAB — 5 HIAA, QUANTITATIVE, URINE, 24 HOUR
5 HIAA UR: 1.5 mg/L
5-HIAA,Quant.,24 Hr Urine: 3.6 mg/24 hr (ref 0.0–14.9)

## 2017-11-11 NOTE — Telephone Encounter (Signed)
Oncology Nurse Navigator Documentation  Oncology Nurse Navigator Flowsheets 11/11/2017  Navigator Location [facility]  Referral date to RadOnc/MedOnc 11/11/2017  Navigator Encounter Type Telephone/I received a message from Dr. Lebron Conners. Patient needs to see T surgery. I contacted Dr. Roxan Hockey. He is able to see patient tomorrow. I called and spoke with patient. She is aware of appt time and place.   Telephone Outgoing Call  Treatment Phase Abnormal Scans  Barriers/Navigation Needs Coordination of Care  Interventions Coordination of Care  Coordination of Care Appts  Acuity Level 2  Acuity Level 2 Assistance expediting appointments  Time Spent with Patient 30

## 2017-11-11 NOTE — Telephone Encounter (Signed)
Patient called with questions for Dr. Lebron Conners about PET and appointment with [facility] not being scheduled for Friday 11/30. Dr. Lebron Conners wants patient to be seen at [place] on Friday 11/13/17. Patient given an appointment for Tuesday 11/17/17.  I called [facility] and they would not move patient appointment to Friday. Dr. Lebron Conners to call [facility] and speak to Dr. Roxy Manns. Dr. Lebron Conners spoke to patient on the telephone and answered her questions. PET scheduled for Thursday 11/29 at 8am.

## 2017-11-11 NOTE — Telephone Encounter (Signed)
Dr. Roxan Hockey may be able to see patient during thoracic clinic at [facility] on Thursday 11/12/17. Thoracic nurse navigator- Norton Blizzard will call patient if Dr. Roxan Hockey is able to see patient tomorrow afternoon. Patient is aware of possibility of appointment tomorrow afternoon.

## 2017-11-12 ENCOUNTER — Ambulatory Visit
Admission: RE | Admit: 2017-11-12 | Discharge: 2017-11-12 | Disposition: A | Discharge status: Home / Self Care | Payer: BLUE CROSS/BLUE SHIELD | Source: Ambulatory Visit | Attending: Hematology and Oncology | Admitting: Hematology and Oncology

## 2017-11-12 ENCOUNTER — Institutional Professional Consult (permissible substitution) (INDEPENDENT_AMBULATORY_CARE_PROVIDER_SITE_OTHER): Payer: BLUE CROSS/BLUE SHIELD | Admitting: Thoracic Surgery (Cardiothoracic Vascular Surgery)

## 2017-11-12 ENCOUNTER — Other Ambulatory Visit: Payer: Self-pay

## 2017-11-12 ENCOUNTER — Encounter: Payer: Self-pay | Admitting: *Deleted

## 2017-11-12 VITALS — BP 122/74 | HR 61 | Temp 96.9°F | Resp 16

## 2017-11-12 DIAGNOSIS — R011 Cardiac murmur, unspecified: Secondary | ICD-10-CM | POA: Diagnosis not present

## 2017-11-12 DIAGNOSIS — J9859 Other diseases of mediastinum, not elsewhere classified: Secondary | ICD-10-CM

## 2017-11-12 DIAGNOSIS — R9389 Abnormal findings on diagnostic imaging of other specified body structures: Secondary | ICD-10-CM | POA: Diagnosis not present

## 2017-11-12 DIAGNOSIS — R590 Localized enlarged lymph nodes: Secondary | ICD-10-CM

## 2017-11-12 DIAGNOSIS — R937 Abnormal findings on diagnostic imaging of other parts of musculoskeletal system: Secondary | ICD-10-CM | POA: Diagnosis not present

## 2017-11-12 LAB — GLUCOSE, CAPILLARY: GLUCOSE-CAPILLARY: 79 mg/dL (ref 65–99)

## 2017-11-12 MED ORDER — FLUDEOXYGLUCOSE F - 18 (FDG) INJECTION
12.9100 | Freq: Once | INTRAVENOUS | Status: AC | PRN
  Administered 2017-11-12: 12.91 via INTRAVENOUS

## 2017-11-12 NOTE — Progress Notes (Signed)
PCP is Doree Albee, MD Referring Provider is Ardath Sax, MD  No chief complaint on file.   HPI: 41 yo woman who presents with a cc/o shortness of breath with exertion.  Mrs. Riden is a 41 yo woman with a past history of endometriosis. She has strep throat about 2 years ago and has not felt the same since then. She c/o feeling fatigued and worsening exercise tolerance. She started getting SOB with exertion about 4 months ago and it has progressed more rapidly over the past 2 months. She gets short of breath going up any kind of incline. She has not had any weight loss. She denies fevers and chills but does have night sweats.  She went ot Dr. Anastasio Champion and was found to have a prominent heart murmur. She was sent to Dr. Rockey Situ. An echo showed a normal EF with no valvular disease. A CT chest showed a central process with narrowing of both left and right main PAs and Ap window adenopathy.  Zubrod Score: At the time of surgery this patient's most appropriate activity status/level should be described as: []     0    Normal activity, no symptoms [x]     1    Restricted in physical strenuous activity but ambulatory, able to do out light work []     2    Ambulatory and capable of self care, unable to do work activities, up and about >50 % of waking hours                              []     3    Only limited self care, in bed greater than 50% of waking hours []     4    Completely disabled, no self care, confined to bed or chair []     5    Moribund   Past Medical History:  Diagnosis Date  . Endometriosis 2007,2009    Past Surgical History:  Procedure Laterality Date  . HIP SURGERY  2013  . LAPAROSCOPIC ABDOMINAL EXPLORATION  2007,2009   endometriosis  . NOSE SURGERY  2007    Family History  Problem Relation Age of Onset  . Heart disease Mother   . Stroke Mother   . Diabetes Mother   . Heart disease Father   . Alcohol abuse Father     Social History Social History   Tobacco  Use  . Smoking status: Never Smoker  . Smokeless tobacco: Never Used  Substance Use Topics  . Alcohol use: No    Alcohol/week: 0.0 oz  . Drug use: No    Current Outpatient Medications  Medication Sig Dispense Refill  . aspirin 81 MG chewable tablet Chew 81 mg daily by mouth.    . cetirizine (ZYRTEC) 10 MG tablet Take 10 mg by mouth daily.    . cholecalciferol (VITAMIN D) 1000 units tablet Take daily by mouth.    . Cyanocobalamin (VITAMIN B 12 PO) Take 3,000 mcg by mouth daily.     . montelukast (SINGULAIR) 10 MG tablet Take 10 mg daily by mouth.  2  . Multiple Vitamins-Minerals (MULTIVITAMIN GUMMIES WOMENS) CHEW Chew by mouth 2 (two) times daily.    . sodium chloride (OCEAN) 0.65 % SOLN nasal spray Place 1 spray into both nostrils as needed for congestion.     No current facility-administered medications for this visit.     Allergies  Allergen Reactions  . Codeine Nausea  Only    Review of Systems  Constitutional: Positive for activity change, diaphoresis and fatigue. Negative for fever and unexpected weight change.  HENT: Positive for voice change. Negative for trouble swallowing.   Eyes: Negative for visual disturbance.  Respiratory: Positive for shortness of breath.   Cardiovascular: Negative for leg swelling.       New heart murmur  Gastrointestinal: Negative for abdominal distention and abdominal pain.  Neurological: Positive for light-headedness.  Hematological: Does not bruise/bleed easily.    BP 122/74 (BP Location: Right Leg)   Pulse 61   Temp (!) 96.9 F (36.1 C) (Oral)   Resp 16   LMP 11/12/2017 (Exact Date)   SpO2 100%  Physical Exam  Constitutional: She is oriented to person, place, and time. She appears well-developed and well-nourished. No distress.  HENT:  Head: Normocephalic and atraumatic.  Mouth/Throat: No oropharyngeal exudate.  Eyes: Conjunctivae and EOM are normal. No scleral icterus.  Neck: Neck supple. No JVD present.  Cardiovascular: Normal  rate and regular rhythm.  Murmur (5-0/0 systolic murmur heard throughout chest) heard. Pulmonary/Chest: Effort normal and breath sounds normal. She has no wheezes.  BS obscured by loud murmur  Abdominal: Soft. She exhibits no distension. There is no tenderness.  Musculoskeletal: She exhibits no edema.  Lymphadenopathy:    She has no cervical adenopathy.  Neurological: She is alert and oriented to person, place, and time. No cranial nerve deficit. She exhibits normal muscle tone.  Skin: Skin is warm and dry.     Diagnostic Tests: CT ANGIOGRAPHY CHEST WITH CONTRAST  TECHNIQUE: Multidetector CT imaging of the chest was performed using the standard protocol during bolus administration of intravenous contrast. Multiplanar CT image reconstructions and MIPs were obtained to evaluate the vascular anatomy.  CONTRAST:  14mL ISOVUE-370 IOPAMIDOL (ISOVUE-370) INJECTION 76%  COMPARISON:  No priors.  FINDINGS: Cardiovascular: No filling defect within the pulmonary arterial tree to suggest underlying pulmonary embolism. However, there is profound narrowing of the pulmonary artery is bilaterally to minimum diameter of 4 mm on the left and 6 mm on the right. This profound mural thickening is somewhat mass-like in appearance and contiguous with adjacent soft tissue in the middle mediastinum which extends to the undersurface of the aortic arch. There is also soft tissue thickening between the right main pulmonary and adjacent ascending thoracic aorta, presumably via their shared adventitia. Notably, there is no other apparent mural thickening of the descending thoracic aorta or great vessels of the mediastinum.  Mediastinum/Nodes: Lymphadenopathy noted in the prevascular nodal stations, measuring up to 1.5 cm in short axis. Amorphous soft tissue also noted in the middle mediastinum between the pulmonary artery is an the undersurface of the aortic arch, potentially additional nodal  tissue. Esophagus is unremarkable in appearance. No axillary lymphadenopathy.  Lungs/Pleura: No suspicious-appearing pulmonary nodules or masses. No acute consolidative airspace disease. No pleural effusions.  Upper Abdomen: Unremarkable. Specifically, no mural thickening or luminal narrowing associated with the abdominal aorta or major intraabdominal arteries.  Musculoskeletal: There are no aggressive appearing lytic or blastic lesions noted in the visualized portions of the skeleton.  Review of the MIP images confirms the above findings.  IMPRESSION: 1. Mass-like thickening of the pulmonary arteries which are severely narrowed bilaterally. This mass-like thickening is contiguous with abnormal soft tissue in the middle mediastinum, and there is some associated prevascular lymphadenopathy is well. Some associated mural thickening of the adjacent portion of the ascending thoracic aorta and undersurface of the thoracic aortic arch. Findings are of  uncertain etiology, with differential considerations favoring a large vessel vasculitis such as Takayasu's arteritis (although it is unusual that there is no apparent involvement of the great vessels of the mediastinum or visualized intraabdominal arteries) or lymphoma. Findings are not favored to represent a primary pulmonary sarcoma. 2. No evidence of pulmonary embolism. These results were called by telephone at the time of interpretation on 11/04/2017 at 12:48 pm to Dr. Ida Rogue, who verbally acknowledged these results.   Electronically Signed   By: Vinnie Langton M.D.   On: 11/04/2017 12:48 MRA CHEST WITH OR WITHOUT CONTRAST  TECHNIQUE: Prior to MRA sequences, preliminary unenhanced sequences were obtained through the chest. Angiographic images of the chest were obtained using MRA technique with intravenous contrast.  CONTRAST:  35mL MULTIHANCE GADOBENATE DIMEGLUMINE 529 MG/ML IV SOLN  COMPARISON:  CTA of  the chest on 11/04/2017  FINDINGS: VASCULAR  Aorta: The aorta shows normal patency without evidence of stenosis or significant atherosclerosis. There is suggestion of wall thickening involving the aortic root and ascending thoracic aorta up to the level of the proximal arch. Findings are suggestive of potential large vessel vasculitis such as Takayasu's arteritis. No great vessel involvement is identified with normally patent proximal great vessels identified. On the MRA sequences, there appears to be normal patency of the innominate artery, proximal left common carotid artery, bilateral subclavian arteries and bilateral axillary arteries.  Heart: Normal heart size.  No pericardial fluid identified.  Pulmonary Arteries: Severe stenoses of bilateral pulmonary artery is again noted centrally with associated wall thickening and soft tissue prominence extending into the mediastinum. Findings are suggestive of large vessel vasculitis/ arteritis.  Other: Venous structures are normally patent including the SVC and central veins.  NON-VASCULAR  Soft tissue in the mediastinum appears contiguous with soft tissue thickening involving the central pulmonary artery is an the proximal aorta as present on CTA. Associated mildly enlarged prevascular lymph nodes adjacent to the aortic arch. No anterior mediastinal mass identified.  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 Soft tissue prominence in the mediastinum as well as mildly enlarged prevascular mediastinal lymph nodes may be related to vasculitis and do not necessarily reflect a neoplastic process. Atypical infectious process is felt to be less likely. A PET scan may be help for further evaluation but may also lead to some potential false positive activity due to acute inflammation.  There is no evidence of great vessel involvement by vasculitis by MRI/MRA.   Electronically Signed   By: Aletta Edouard M.D.   On: 11/06/2017 11:47 NUCLEAR MEDICINE PET WHOLE BODY  TECHNIQUE: 12.9 mCi F-18 FDG was injected intravenously. Full-ring PET imaging was performed from the vertex to the feet after the radiotracer. CT data was obtained and used for attenuation correction and anatomic localization.  FASTING BLOOD GLUCOSE:  Value: 79 mg/dl  COMPARISON:  CT from 11/05/2017 and 11/04/2017  FINDINGS: HEAD/NECK  No hypermetabolic activity in the scalp. No hypermetabolic cervical lymph nodes.  CHEST  No suspicious pulmonary nodules on the CT scan.  Enlarged prevascular anterior mediastinal lymph node measures 1.3 by 2.5 cm and has an SUV max equal to 5.1. Amorphous increased soft tissue within the AP window region exhibits increased FDG uptake with an SUV max equal to 9.4. Within the right paratracheal region there is a 11 mm lymph node within SUV max equal to 3.19.  No hypermetabolic supraclavicular or axillary  lymph nodes.  ABDOMEN/PELVIS  No abnormal hypermetabolic activity within the liver, pancreas, adrenal glands, or spleen. No hypermetabolic lymph nodes in the abdomen or pelvis.  SKELETON  No focal hypermetabolic activity to suggest skeletal metastasis.  EXTREMITIES  No abnormal hypermetabolic activity in the lower extremities.  IMPRESSION: 1. There is moderate increased FDG uptake associated with the a amorphous increased soft tissue within the AP window region of the left mediastinum. There is also increased uptake associated with adjacent enlarged prevascular anterior mediastinal lymph node. Findings are compatible with a malignant process such as primary bronchogenic carcinoma or lymphoma. Correlation with tissue sampling advised.   Electronically Signed   By: Kerby Moors M.D.   On: 11/12/2017 10:49  I personally  reviewed the CT, MR and PET images and d/w Radiology. I concur with the findings noted above  Impression: 41 yo woman presents with progressive dyspnea on exertion, voice change and night sweats. Exam revealed a new heart murmur. W/u has shown a soft tissue mass centrally with narrowing of both main PAs. The differential diagnosis includes lymphoma, thymoma, bronchogenic carcinoma and vasculitis. I suspect this will turn out to be a vasculitis, although parts of her history are suggestive of lymphoma. In any event she needs a tissue diagnosis to guide therapy.   I think the best option is a left anterior mediastinotomy Barbra Sarks). This will provide access to the AP window nodes and underlying soft tissue mass.   I discussed the general nature of the procedure, the need for general anesthesia, and the incision to be used with Mrs. Gullett and her husband. We discussed the plan for an outpatient procedure and the overall recovery. I reviewed the indications, risks, benefits and alternatives. They understand the risks include but are not limited to death, MI, DVT/PE, bleeding, possible need for transfusion, infection, pneumothorax, possible need for sternotomy or thoracotomy, as well as other organ system dysfunction including respiratory, renal, or GI complications.   She accept the risks and agrees to proceed.  Plan: Left anterior mediastinotomy Barbra Sarks procedure) early next week  [name], MD [facility] [phone]

## 2017-11-12 NOTE — Progress Notes (Signed)
Oncology Nurse Navigator Documentation  Oncology Nurse Navigator Flowsheets 11/12/2017  Navigator Location [facility]  Navigator Encounter Type Clinic/MDC/spoke with patient and husband today at clinic. Updated on next steps.   [facility] Date 11/12/2017  Patient Visit Type MedOnc  Treatment Phase Abnormal Scans  Barriers/Navigation Needs Education  Education Other  Interventions Education  Education Method Verbal  Acuity Level 1  Time Spent with Patient 30

## 2017-11-12 NOTE — H&P (View-Only) (Signed)
PCP is Doree Albee, MD Referring Provider is Ardath Sax, MD  No chief complaint on file.   HPI: 41 yo woman who presents with a cc/o shortness of breath with exertion.  Kara Anderson is a 41 yo woman with a past history of endometriosis. She has strep throat about 2 years ago and has not felt the same since then. She c/o feeling fatigued and worsening exercise tolerance. She started getting SOB with exertion about 4 months ago and it has progressed more rapidly over the past 2 months. She gets short of breath going up any kind of incline. She has not had any weight loss. She denies fevers and chills but does have night sweats.  She went ot Dr. Anastasio Champion and was found to have a prominent heart murmur. She was sent to Dr. Rockey Situ. An echo showed a normal EF with no valvular disease. A CT chest showed a central process with narrowing of both left and right main PAs and Ap window adenopathy.  Zubrod Score: At the time of surgery this patient's most appropriate activity status/level should be described as: []     0    Normal activity, no symptoms [x]     1    Restricted in physical strenuous activity but ambulatory, able to do out light work []     2    Ambulatory and capable of self care, unable to do work activities, up and about >50 % of waking hours                              []     3    Only limited self care, in bed greater than 50% of waking hours []     4    Completely disabled, no self care, confined to bed or chair []     5    Moribund   Past Medical History:  Diagnosis Date  . Endometriosis 2007,2009    Past Surgical History:  Procedure Laterality Date  . HIP SURGERY  2013  . LAPAROSCOPIC ABDOMINAL EXPLORATION  2007,2009   endometriosis  . NOSE SURGERY  2007    Family History  Problem Relation Age of Onset  . Heart disease Mother   . Stroke Mother   . Diabetes Mother   . Heart disease Father   . Alcohol abuse Father     Social History Social History   Tobacco  Use  . Smoking status: Never Smoker  . Smokeless tobacco: Never Used  Substance Use Topics  . Alcohol use: No    Alcohol/week: 0.0 oz  . Drug use: No    Current Outpatient Medications  Medication Sig Dispense Refill  . aspirin 81 MG chewable tablet Chew 81 mg daily by mouth.    . cetirizine (ZYRTEC) 10 MG tablet Take 10 mg by mouth daily.    . cholecalciferol (VITAMIN D) 1000 units tablet Take daily by mouth.    . Cyanocobalamin (VITAMIN B 12 PO) Take 3,000 mcg by mouth daily.     . montelukast (SINGULAIR) 10 MG tablet Take 10 mg daily by mouth.  2  . Multiple Vitamins-Minerals (MULTIVITAMIN GUMMIES WOMENS) CHEW Chew by mouth 2 (two) times daily.    . sodium chloride (OCEAN) 0.65 % SOLN nasal spray Place 1 spray into both nostrils as needed for congestion.     No current facility-administered medications for this visit.     Allergies  Allergen Reactions  . Codeine Nausea  Only    Review of Systems  Constitutional: Positive for activity change, diaphoresis and fatigue. Negative for fever and unexpected weight change.  HENT: Positive for voice change. Negative for trouble swallowing.   Eyes: Negative for visual disturbance.  Respiratory: Positive for shortness of breath.   Cardiovascular: Negative for leg swelling.       New heart murmur  Gastrointestinal: Negative for abdominal distention and abdominal pain.  Neurological: Positive for light-headedness.  Hematological: Does not bruise/bleed easily.    BP 122/74 (BP Location: Right Leg)   Pulse 61   Temp (!) 96.9 F (36.1 C) (Oral)   Resp 16   LMP 11/12/2017 (Exact Date)   SpO2 100%  Physical Exam  Constitutional: She is oriented to person, place, and time. She appears well-developed and well-nourished. No distress.  HENT:  Head: Normocephalic and atraumatic.  Mouth/Throat: No oropharyngeal exudate.  Eyes: Conjunctivae and EOM are normal. No scleral icterus.  Neck: Neck supple. No JVD present.  Cardiovascular: Normal  rate and regular rhythm.  Murmur (3-5/3 systolic murmur heard throughout chest) heard. Pulmonary/Chest: Effort normal and breath sounds normal. She has no wheezes.  BS obscured by loud murmur  Abdominal: Soft. She exhibits no distension. There is no tenderness.  Musculoskeletal: She exhibits no edema.  Lymphadenopathy:    She has no cervical adenopathy.  Neurological: She is alert and oriented to person, place, and time. No cranial nerve deficit. She exhibits normal muscle tone.  Skin: Skin is warm and dry.     Diagnostic Tests: CT ANGIOGRAPHY CHEST WITH CONTRAST  TECHNIQUE: Multidetector CT imaging of the chest was performed using the standard protocol during bolus administration of intravenous contrast. Multiplanar CT image reconstructions and MIPs were obtained to evaluate the vascular anatomy.  CONTRAST:  61mL ISOVUE-370 IOPAMIDOL (ISOVUE-370) INJECTION 76%  COMPARISON:  No priors.  FINDINGS: Cardiovascular: No filling defect within the pulmonary arterial tree to suggest underlying pulmonary embolism. However, there is profound narrowing of the pulmonary artery is bilaterally to minimum diameter of 4 mm on the left and 6 mm on the right. This profound mural thickening is somewhat mass-like in appearance and contiguous with adjacent soft tissue in the middle mediastinum which extends to the undersurface of the aortic arch. There is also soft tissue thickening between the right main pulmonary and adjacent ascending thoracic aorta, presumably via their shared adventitia. Notably, there is no other apparent mural thickening of the descending thoracic aorta or great vessels of the mediastinum.  Mediastinum/Nodes: Lymphadenopathy noted in the prevascular nodal stations, measuring up to 1.5 cm in short axis. Amorphous soft tissue also noted in the middle mediastinum between the pulmonary artery is an the undersurface of the aortic arch, potentially additional nodal  tissue. Esophagus is unremarkable in appearance. No axillary lymphadenopathy.  Lungs/Pleura: No suspicious-appearing pulmonary nodules or masses. No acute consolidative airspace disease. No pleural effusions.  Upper Abdomen: Unremarkable. Specifically, no mural thickening or luminal narrowing associated with the abdominal aorta or major intraabdominal arteries.  Musculoskeletal: There are no aggressive appearing lytic or blastic lesions noted in the visualized portions of the skeleton.  Review of the MIP images confirms the above findings.  IMPRESSION: 1. Mass-like thickening of the pulmonary arteries which are severely narrowed bilaterally. This mass-like thickening is contiguous with abnormal soft tissue in the middle mediastinum, and there is some associated prevascular lymphadenopathy is well. Some associated mural thickening of the adjacent portion of the ascending thoracic aorta and undersurface of the thoracic aortic arch. Findings are of  uncertain etiology, with differential considerations favoring a large vessel vasculitis such as Takayasu's arteritis (although it is unusual that there is no apparent involvement of the great vessels of the mediastinum or visualized intraabdominal arteries) or lymphoma. Findings are not favored to represent a primary pulmonary sarcoma. 2. No evidence of pulmonary embolism. These results were called by telephone at the time of interpretation on 11/04/2017 at 12:48 pm to Dr. Ida Rogue, who verbally acknowledged these results.   Electronically Signed   By: Vinnie Langton M.D.   On: 11/04/2017 12:48 MRA CHEST WITH OR WITHOUT CONTRAST  TECHNIQUE: Prior to MRA sequences, preliminary unenhanced sequences were obtained through the chest. Angiographic images of the chest were obtained using MRA technique with intravenous contrast.  CONTRAST:  79mL MULTIHANCE GADOBENATE DIMEGLUMINE 529 MG/ML IV SOLN  COMPARISON:  CTA of  the chest on 11/04/2017  FINDINGS: VASCULAR  Aorta: The aorta shows normal patency without evidence of stenosis or significant atherosclerosis. There is suggestion of wall thickening involving the aortic root and ascending thoracic aorta up to the level of the proximal arch. Findings are suggestive of potential large vessel vasculitis such as Takayasu's arteritis. No great vessel involvement is identified with normally patent proximal great vessels identified. On the MRA sequences, there appears to be normal patency of the innominate artery, proximal left common carotid artery, bilateral subclavian arteries and bilateral axillary arteries.  Heart: Normal heart size.  No pericardial fluid identified.  Pulmonary Arteries: Severe stenoses of bilateral pulmonary artery is again noted centrally with associated wall thickening and soft tissue prominence extending into the mediastinum. Findings are suggestive of large vessel vasculitis/ arteritis.  Other: Venous structures are normally patent including the SVC and central veins.  NON-VASCULAR  Soft tissue in the mediastinum appears contiguous with soft tissue thickening involving the central pulmonary artery is an the proximal aorta as present on CTA. Associated mildly enlarged prevascular lymph nodes adjacent to the aortic arch. No anterior mediastinal mass identified.  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 Soft tissue prominence in the mediastinum as well as mildly enlarged prevascular mediastinal lymph nodes may be related to vasculitis and do not necessarily reflect a neoplastic process. Atypical infectious process is felt to be less likely. A PET scan may be help for further evaluation but may also lead to some potential false positive activity due to acute inflammation.  There is no evidence of great vessel involvement by vasculitis by MRI/MRA.   Electronically Signed   By: Aletta Edouard M.D.   On: 11/06/2017 11:47 NUCLEAR MEDICINE PET WHOLE BODY  TECHNIQUE: 12.9 mCi F-18 FDG was injected intravenously. Full-ring PET imaging was performed from the vertex to the feet after the radiotracer. CT data was obtained and used for attenuation correction and anatomic localization.  FASTING BLOOD GLUCOSE:  Value: 79 mg/dl  COMPARISON:  CT from 11/05/2017 and 11/04/2017  FINDINGS: HEAD/NECK  No hypermetabolic activity in the scalp. No hypermetabolic cervical lymph nodes.  CHEST  No suspicious pulmonary nodules on the CT scan.  Enlarged prevascular anterior mediastinal lymph node measures 1.3 by 2.5 cm and has an SUV max equal to 5.1. Amorphous increased soft tissue within the AP window region exhibits increased FDG uptake with an SUV max equal to 9.4. Within the right paratracheal region there is a 11 mm lymph node within SUV max equal to 3.19.  No hypermetabolic supraclavicular or axillary  lymph nodes.  ABDOMEN/PELVIS  No abnormal hypermetabolic activity within the liver, pancreas, adrenal glands, or spleen. No hypermetabolic lymph nodes in the abdomen or pelvis.  SKELETON  No focal hypermetabolic activity to suggest skeletal metastasis.  EXTREMITIES  No abnormal hypermetabolic activity in the lower extremities.  IMPRESSION: 1. There is moderate increased FDG uptake associated with the a amorphous increased soft tissue within the AP window region of the left mediastinum. There is also increased uptake associated with adjacent enlarged prevascular anterior mediastinal lymph node. Findings are compatible with a malignant process such as primary bronchogenic carcinoma or lymphoma. Correlation with tissue sampling advised.   Electronically Signed   By: Kerby Moors M.D.   On: 11/12/2017 10:49  I personally  reviewed the CT, MR and PET images and d/w Radiology. I concur with the findings noted above  Impression: 41 yo woman presents with progressive dyspnea on exertion, voice change and night sweats. Exam revealed a new heart murmur. W/u has shown a soft tissue mass centrally with narrowing of both main PAs. The differential diagnosis includes lymphoma, thymoma, bronchogenic carcinoma and vasculitis. I suspect this will turn out to be a vasculitis, although parts of her history are suggestive of lymphoma. In any event she needs a tissue diagnosis to guide therapy.   I think the best option is a left anterior mediastinotomy Kara Anderson). This will provide access to the AP window nodes and underlying soft tissue mass.   I discussed the general nature of the procedure, the need for general anesthesia, and the incision to be used with Kara Anderson and her husband. We discussed the plan for an outpatient procedure and the overall recovery. I reviewed the indications, risks, benefits and alternatives. They understand the risks include but are not limited to death, MI, DVT/PE, bleeding, possible need for transfusion, infection, pneumothorax, possible need for sternotomy or thoracotomy, as well as other organ system dysfunction including respiratory, renal, or GI complications.   She accept the risks and agrees to proceed.  Plan: Left anterior mediastinotomy Kara Anderson procedure) early next week  Kara Nakayama, MD [facility] [phone]

## 2017-11-13 ENCOUNTER — Encounter (HOSPITAL_COMMUNITY): Payer: Self-pay | Admitting: *Deleted

## 2017-11-13 ENCOUNTER — Other Ambulatory Visit: Payer: Self-pay

## 2017-11-13 ENCOUNTER — Ambulatory Visit: Payer: BLUE CROSS/BLUE SHIELD

## 2017-11-13 ENCOUNTER — Telehealth: Payer: Self-pay

## 2017-11-13 NOTE — Telephone Encounter (Signed)
Pt is having biopsy on 12/3. She is asking if she needs to reschedul her appt on 12/4 for when biopsy results are back. (before Dr Clydene Laming vacation)

## 2017-11-13 NOTE — Progress Notes (Signed)
Pt denies any acute cardiopulmonary issues. Pt under the care of Dr. Rockey Situ, Cardiology. Pt denies having a stress test and cardiac cath. Pt made aware to stop taking vitamins, fish oil and herbal medications. Do not take any NSAIDs ie: Ibuprofen, Advil, Naproxen (Aleve),  Voltaren, Motrin, BC and Goody Powder. Pt verbalized understanding of all pre-op instructions.

## 2017-11-15 NOTE — Anesthesia Preprocedure Evaluation (Addendum)
Anesthesia Evaluation  Patient identified by MRN, date of birth, ID band Patient awake    Reviewed: Allergy & Precautions, NPO status , Patient's Chart, lab work & pertinent test results  Airway Mallampati: II  TM Distance: >3 FB Neck ROM: Full    Dental  (+) Dental Advisory Given   Pulmonary shortness of breath,    breath sounds clear to auscultation       Cardiovascular + Valvular Problems/Murmurs  Rhythm:Regular Rate:Normal  Mass surrounding and causing severe stenosis of both right and left PA's.   Neuro/Psych negative neurological ROS     GI/Hepatic negative GI ROS, Neg liver ROS,   Endo/Other  negative endocrine ROS  Renal/GU negative Renal ROS     Musculoskeletal   Abdominal   Peds  Hematology  (+) anemia ,   Anesthesia Other Findings   Reproductive/Obstetrics                            Lab Results  Component Value Date   WBC 7.2 11/09/2017   HGB 11.6 11/09/2017   HCT 36.1 11/09/2017   MCV 86.2 11/09/2017   PLT 331 11/09/2017   Lab Results  Component Value Date   CREATININE 0.8 11/09/2017   BUN 13.7 11/09/2017   NA 137 11/09/2017   K 4.3 11/09/2017   CL 100 (L) 11/04/2017   CO2 27 11/09/2017    Anesthesia Physical Anesthesia Plan  ASA: III  Anesthesia Plan: General   Post-op Pain Management:    Induction: Intravenous  PONV Risk Score and Plan: 3 and Ondansetron, Dexamethasone, Midazolam and Treatment may vary due to age or medical condition  Airway Management Planned: Oral ETT  Additional Equipment: Arterial line  Intra-op Plan:   Post-operative Plan: Extubation in OR  Informed Consent: I have reviewed the patients History and Physical, chart, labs and discussed the procedure including the risks, benefits and alternatives for the proposed anesthesia with the patient or authorized representative who has indicated his/her understanding and acceptance.    Dental advisory given  Plan Discussed with: CRNA  Anesthesia Plan Comments:        Anesthesia Quick Evaluation

## 2017-11-16 ENCOUNTER — Ambulatory Visit (HOSPITAL_COMMUNITY)
Admission: RE | Admit: 2017-11-16 | Discharge: 2017-11-16 | Disposition: A | Discharge status: Home / Self Care | Payer: BLUE CROSS/BLUE SHIELD | Source: Ambulatory Visit | Attending: Thoracic Surgery (Cardiothoracic Vascular Surgery) | Admitting: Thoracic Surgery (Cardiothoracic Vascular Surgery)

## 2017-11-16 ENCOUNTER — Encounter (HOSPITAL_COMMUNITY)
Admission: RE | Disposition: A | Discharge status: Home / Self Care | Payer: Self-pay | Source: Ambulatory Visit | Attending: Thoracic Surgery (Cardiothoracic Vascular Surgery)

## 2017-11-16 ENCOUNTER — Ambulatory Visit (HOSPITAL_COMMUNITY): Payer: BLUE CROSS/BLUE SHIELD | Admitting: Anesthesiology

## 2017-11-16 ENCOUNTER — Encounter (HOSPITAL_COMMUNITY): Payer: Self-pay | Admitting: *Deleted

## 2017-11-16 ENCOUNTER — Ambulatory Visit (HOSPITAL_COMMUNITY): Payer: BLUE CROSS/BLUE SHIELD

## 2017-11-16 ENCOUNTER — Other Ambulatory Visit: Payer: Self-pay

## 2017-11-16 DIAGNOSIS — R590 Localized enlarged lymph nodes: Secondary | ICD-10-CM | POA: Insufficient documentation

## 2017-11-16 DIAGNOSIS — Z7982 Long term (current) use of aspirin: Secondary | ICD-10-CM | POA: Diagnosis not present

## 2017-11-16 DIAGNOSIS — I776 Arteritis, unspecified: Secondary | ICD-10-CM | POA: Insufficient documentation

## 2017-11-16 DIAGNOSIS — N6310 Unspecified lump in the right breast, unspecified quadrant: Secondary | ICD-10-CM | POA: Diagnosis not present

## 2017-11-16 DIAGNOSIS — Z833 Family history of diabetes mellitus: Secondary | ICD-10-CM | POA: Diagnosis not present

## 2017-11-16 DIAGNOSIS — Z811 Family history of alcohol abuse and dependence: Secondary | ICD-10-CM | POA: Diagnosis not present

## 2017-11-16 DIAGNOSIS — Z8249 Family history of ischemic heart disease and other diseases of the circulatory system: Secondary | ICD-10-CM | POA: Diagnosis not present

## 2017-11-16 DIAGNOSIS — Z823 Family history of stroke: Secondary | ICD-10-CM | POA: Diagnosis not present

## 2017-11-16 DIAGNOSIS — I288 Other diseases of pulmonary vessels: Secondary | ICD-10-CM | POA: Diagnosis not present

## 2017-11-16 DIAGNOSIS — Z9889 Other specified postprocedural states: Secondary | ICD-10-CM | POA: Diagnosis not present

## 2017-11-16 DIAGNOSIS — Z01818 Encounter for other preprocedural examination: Secondary | ICD-10-CM | POA: Diagnosis not present

## 2017-11-16 DIAGNOSIS — Z79899 Other long term (current) drug therapy: Secondary | ICD-10-CM | POA: Insufficient documentation

## 2017-11-16 HISTORY — DX: Infectious mononucleosis, unspecified without complication: B27.90

## 2017-11-16 HISTORY — DX: Streptococcal pharyngitis: J02.0

## 2017-11-16 HISTORY — DX: Viral meningitis, unspecified: A87.9

## 2017-11-16 HISTORY — DX: Other seasonal allergic rhinitis: J30.2

## 2017-11-16 HISTORY — DX: Localized enlarged lymph nodes: R59.0

## 2017-11-16 HISTORY — PX: MEDIASTINOTOMY CHAMBERLAIN MCNEIL: SHX5966

## 2017-11-16 LAB — APTT: aPTT: 34 seconds (ref 24–36)

## 2017-11-16 LAB — COMPREHENSIVE METABOLIC PANEL
ALK PHOS: 63 U/L (ref 38–126)
ALT: 10 U/L — AB (ref 14–54)
ANION GAP: 6 (ref 5–15)
AST: 16 U/L (ref 15–41)
Albumin: 3.3 g/dL — ABNORMAL LOW (ref 3.5–5.0)
BUN: 16 mg/dL (ref 6–20)
CALCIUM: 8.8 mg/dL — AB (ref 8.9–10.3)
CO2: 27 mmol/L (ref 22–32)
CREATININE: 0.63 mg/dL (ref 0.44–1.00)
Chloride: 102 mmol/L (ref 101–111)
Glucose, Bld: 86 mg/dL (ref 65–99)
Potassium: 3.7 mmol/L (ref 3.5–5.1)
SODIUM: 135 mmol/L (ref 135–145)
Total Bilirubin: 0.6 mg/dL (ref 0.3–1.2)
Total Protein: 7.7 g/dL (ref 6.5–8.1)

## 2017-11-16 LAB — CBC
HCT: 33.6 % — ABNORMAL LOW (ref 36.0–46.0)
HEMOGLOBIN: 10.8 g/dL — AB (ref 12.0–15.0)
MCH: 27.1 pg (ref 26.0–34.0)
MCHC: 32.1 g/dL (ref 30.0–36.0)
MCV: 84.2 fL (ref 78.0–100.0)
Platelets: 295 10*3/uL (ref 150–400)
RBC: 3.99 MIL/uL (ref 3.87–5.11)
RDW: 14.1 % (ref 11.5–15.5)
WBC: 7.3 10*3/uL (ref 4.0–10.5)

## 2017-11-16 LAB — PREPARE RBC (CROSSMATCH)

## 2017-11-16 LAB — SURGICAL PCR SCREEN
MRSA, PCR: NEGATIVE
Staphylococcus aureus: POSITIVE — AB

## 2017-11-16 LAB — ABO/RH: ABO/RH(D): O POS

## 2017-11-16 LAB — HCG, SERUM, QUALITATIVE: PREG SERUM: NEGATIVE

## 2017-11-16 LAB — PROTIME-INR
INR: 1.28
PROTHROMBIN TIME: 15.9 s — AB (ref 11.4–15.2)

## 2017-11-16 SURGERY — MEDIASTINOTOMY, CHAMBERLAIN
Anesthesia: General | Site: Chest | Laterality: Left

## 2017-11-16 MED ORDER — MIDAZOLAM HCL 2 MG/2ML IJ SOLN
INTRAMUSCULAR | Status: AC
  Filled 2017-11-16: qty 2

## 2017-11-16 MED ORDER — FENTANYL CITRATE (PF) 100 MCG/2ML IJ SOLN
INTRAMUSCULAR | Status: DC | PRN
  Administered 2017-11-16: 100 ug via INTRAVENOUS
  Administered 2017-11-16 (×3): 50 ug via INTRAVENOUS

## 2017-11-16 MED ORDER — LIDOCAINE 2% (20 MG/ML) 5 ML SYRINGE
INTRAMUSCULAR | Status: DC | PRN
  Administered 2017-11-16: 60 mg via INTRAVENOUS

## 2017-11-16 MED ORDER — DEXAMETHASONE SODIUM PHOSPHATE 10 MG/ML IJ SOLN
INTRAMUSCULAR | Status: AC
  Filled 2017-11-16: qty 1

## 2017-11-16 MED ORDER — MUPIROCIN 2 % EX OINT
1.0000 "application " | TOPICAL_OINTMENT | Freq: Once | CUTANEOUS | Status: DC
  Filled 2017-11-16: qty 22

## 2017-11-16 MED ORDER — SUCCINYLCHOLINE CHLORIDE 200 MG/10ML IV SOSY
PREFILLED_SYRINGE | INTRAVENOUS | Status: AC
  Filled 2017-11-16: qty 10

## 2017-11-16 MED ORDER — EPHEDRINE 5 MG/ML INJ
INTRAVENOUS | Status: AC
  Filled 2017-11-16: qty 10

## 2017-11-16 MED ORDER — OXYCODONE HCL 5 MG PO TABS: 5.0000 mg | ORAL_TABLET | ORAL | Status: DC | PRN

## 2017-11-16 MED ORDER — PROPOFOL 10 MG/ML IV BOLUS
INTRAVENOUS | Status: AC
  Filled 2017-11-16: qty 20

## 2017-11-16 MED ORDER — ROCURONIUM BROMIDE 10 MG/ML (PF) SYRINGE
PREFILLED_SYRINGE | INTRAVENOUS | Status: AC
  Filled 2017-11-16: qty 5

## 2017-11-16 MED ORDER — CEFUROXIME SODIUM 1.5 G IV SOLR
1.5000 g | INTRAVENOUS | Status: AC
  Administered 2017-11-16: 1.5 g via INTRAVENOUS
  Filled 2017-11-16: qty 1.5

## 2017-11-16 MED ORDER — PROPOFOL 10 MG/ML IV BOLUS
INTRAVENOUS | Status: DC | PRN
  Administered 2017-11-16: 150 mg via INTRAVENOUS
  Administered 2017-11-16: 30 mg via INTRAVENOUS

## 2017-11-16 MED ORDER — ONDANSETRON HCL 4 MG/2ML IJ SOLN
INTRAMUSCULAR | Status: AC
  Filled 2017-11-16: qty 2

## 2017-11-16 MED ORDER — ROCURONIUM BROMIDE 100 MG/10ML IV SOLN
INTRAVENOUS | Status: DC | PRN
  Administered 2017-11-16 (×4): 20 mg via INTRAVENOUS
  Administered 2017-11-16: 40 mg via INTRAVENOUS

## 2017-11-16 MED ORDER — 0.9 % SODIUM CHLORIDE (POUR BTL) OPTIME
TOPICAL | Status: DC | PRN
  Administered 2017-11-16: 2000 mL

## 2017-11-16 MED ORDER — ONDANSETRON HCL 4 MG/2ML IJ SOLN
INTRAMUSCULAR | Status: DC | PRN
  Administered 2017-11-16: 4 mg via INTRAVENOUS

## 2017-11-16 MED ORDER — PHENYLEPHRINE 40 MCG/ML (10ML) SYRINGE FOR IV PUSH (FOR BLOOD PRESSURE SUPPORT)
PREFILLED_SYRINGE | INTRAVENOUS | Status: AC
  Filled 2017-11-16: qty 10

## 2017-11-16 MED ORDER — HYDROMORPHONE HCL 1 MG/ML IJ SOLN
0.2500 mg | INTRAMUSCULAR | Status: DC | PRN
  Administered 2017-11-16 (×2): 0.5 mg via INTRAVENOUS

## 2017-11-16 MED ORDER — PROMETHAZINE HCL 25 MG/ML IJ SOLN
INTRAMUSCULAR | Status: AC
Start: 2017-11-16 — End: 2017-11-16
  Administered 2017-11-16: 6.25 mg via INTRAVENOUS
  Filled 2017-11-16: qty 1

## 2017-11-16 MED ORDER — HEMOSTATIC AGENTS (NO CHARGE) OPTIME
TOPICAL | Status: DC | PRN
  Administered 2017-11-16: 1 via TOPICAL

## 2017-11-16 MED ORDER — OXYCODONE HCL 5 MG PO TABS: 5.0000 mg | ORAL_TABLET | ORAL | 0 refills | Status: DC | PRN

## 2017-11-16 MED ORDER — LACTATED RINGERS IV SOLN
INTRAVENOUS | Status: DC | PRN
  Administered 2017-11-16: 07:00:00 via INTRAVENOUS

## 2017-11-16 MED ORDER — PROMETHAZINE HCL 25 MG/ML IJ SOLN
6.2500 mg | INTRAMUSCULAR | Status: DC | PRN
  Administered 2017-11-16: 6.25 mg via INTRAVENOUS

## 2017-11-16 MED ORDER — FENTANYL CITRATE (PF) 250 MCG/5ML IJ SOLN
INTRAMUSCULAR | Status: AC
  Filled 2017-11-16: qty 5

## 2017-11-16 MED ORDER — DEXAMETHASONE SODIUM PHOSPHATE 4 MG/ML IJ SOLN
INTRAMUSCULAR | Status: DC | PRN
  Administered 2017-11-16: 8 mg via INTRAVENOUS

## 2017-11-16 MED ORDER — SODIUM CHLORIDE 0.9 % IV SOLN: Freq: Once | INTRAVENOUS | Status: DC

## 2017-11-16 MED ORDER — SUGAMMADEX SODIUM 200 MG/2ML IV SOLN
INTRAVENOUS | Status: DC | PRN
  Administered 2017-11-16: 200 mg via INTRAVENOUS

## 2017-11-16 MED ORDER — LIDOCAINE 2% (20 MG/ML) 5 ML SYRINGE
INTRAMUSCULAR | Status: AC
  Filled 2017-11-16: qty 5

## 2017-11-16 MED ORDER — HYDROMORPHONE HCL 1 MG/ML IJ SOLN
INTRAMUSCULAR | Status: AC
  Administered 2017-11-16: 0.5 mg via INTRAVENOUS
  Filled 2017-11-16: qty 1

## 2017-11-16 MED ORDER — CHLORHEXIDINE GLUCONATE CLOTH 2 % EX PADS: 6.0000 | MEDICATED_PAD | Freq: Once | CUTANEOUS | Status: DC

## 2017-11-16 MED ORDER — SUGAMMADEX SODIUM 200 MG/2ML IV SOLN
INTRAVENOUS | Status: AC
  Filled 2017-11-16: qty 2

## 2017-11-16 MED ORDER — LACTATED RINGERS IV SOLN
INTRAVENOUS | Status: DC | PRN
  Administered 2017-11-16 (×2): via INTRAVENOUS

## 2017-11-16 MED ORDER — MIDAZOLAM HCL 5 MG/5ML IJ SOLN
INTRAMUSCULAR | Status: DC | PRN
  Administered 2017-11-16: 2 mg via INTRAVENOUS

## 2017-11-16 MED ORDER — PHENYLEPHRINE 40 MCG/ML (10ML) SYRINGE FOR IV PUSH (FOR BLOOD PRESSURE SUPPORT)
PREFILLED_SYRINGE | INTRAVENOUS | Status: DC | PRN
  Administered 2017-11-16: 80 ug via INTRAVENOUS

## 2017-11-16 SURGICAL SUPPLY — 60 items
APPLIER CLIP LOGIC TI 5 (MISCELLANEOUS) IMPLANT
CANISTER SUCT 3000ML PPV (MISCELLANEOUS) ×2 IMPLANT
CATH ROBINSON RED A/P 16FR (CATHETERS) ×2 IMPLANT
CLIP VESOCCLUDE MED 24/CT (CLIP) ×2 IMPLANT
CONT SPEC 4OZ CLIKSEAL STRL BL (MISCELLANEOUS) ×4 IMPLANT
CONT SPECI 4OZ STER CLIK (MISCELLANEOUS) ×6 IMPLANT
COVER SURGICAL LIGHT HANDLE (MISCELLANEOUS) ×2 IMPLANT
DERMABOND ADVANCED (GAUZE/BANDAGES/DRESSINGS) ×1
DERMABOND ADVANCED .7 DNX12 (GAUZE/BANDAGES/DRESSINGS) ×1 IMPLANT
DRAPE CHEST BREAST 15X10 FENES (DRAPES) ×2 IMPLANT
ELECT REM PT RETURN 9FT ADLT (ELECTROSURGICAL) ×2
ELECTRODE REM PT RTRN 9FT ADLT (ELECTROSURGICAL) ×1 IMPLANT
GAUZE SPONGE 4X4 12PLY STRL (GAUZE/BANDAGES/DRESSINGS) ×2 IMPLANT
GAUZE SPONGE 4X4 12PLY STRL LF (GAUZE/BANDAGES/DRESSINGS) ×2 IMPLANT
GAUZE SPONGE 4X4 16PLY XRAY LF (GAUZE/BANDAGES/DRESSINGS) IMPLANT
GLOVE BIO SURGEON STRL SZ 6 (GLOVE) ×2 IMPLANT
GLOVE BIO SURGEON STRL SZ 6.5 (GLOVE) ×2 IMPLANT
GLOVE BIOGEL PI IND STRL 6 (GLOVE) ×1 IMPLANT
GLOVE BIOGEL PI INDICATOR 6 (GLOVE) ×1
GLOVE SURG SIGNA 7.5 PF LTX (GLOVE) ×2 IMPLANT
GOWN STRL REUS W/ TWL LRG LVL3 (GOWN DISPOSABLE) ×3 IMPLANT
GOWN STRL REUS W/ TWL XL LVL3 (GOWN DISPOSABLE) ×1 IMPLANT
GOWN STRL REUS W/TWL LRG LVL3 (GOWN DISPOSABLE) ×3
GOWN STRL REUS W/TWL XL LVL3 (GOWN DISPOSABLE) ×1
HEMOSTAT SURGICEL 2X14 (HEMOSTASIS) IMPLANT
KIT BASIN OR (CUSTOM PROCEDURE TRAY) ×2 IMPLANT
KIT ROOM TURNOVER OR (KITS) ×2 IMPLANT
NEEDLE 22X1 1/2 (OR ONLY) (NEEDLE) ×2 IMPLANT
NEEDLE BIOPSY 14X6 SOFT TISS (NEEDLE) ×2 IMPLANT
NS IRRIG 1000ML POUR BTL (IV SOLUTION) ×4 IMPLANT
PACK GENERAL/GYN (CUSTOM PROCEDURE TRAY) ×2 IMPLANT
PAD ARMBOARD 7.5X6 YLW CONV (MISCELLANEOUS) ×4 IMPLANT
SPONGE INTESTINAL PEANUT (DISPOSABLE) IMPLANT
STAPLE RELOAD 45MM GOLD (STAPLE) ×4 IMPLANT
STAPLER ECHELON POWERED (MISCELLANEOUS) ×2 IMPLANT
SUT PROLENE 4 0 RB 1 (SUTURE) ×2
SUT PROLENE 4-0 RB1 .5 CRCL 36 (SUTURE) ×2 IMPLANT
SUT PROLENE 5 0 C 1 36 (SUTURE) ×2 IMPLANT
SUT SILK 2 0 TIES 10X30 (SUTURE) IMPLANT
SUT SILK 3 0 SH CR/8 (SUTURE) ×2 IMPLANT
SUT VIC AB 1 CTX 36 (SUTURE) ×1
SUT VIC AB 1 CTX36XBRD ANBCTR (SUTURE) ×1 IMPLANT
SUT VIC AB 2-0 CT1 27 (SUTURE) ×1
SUT VIC AB 2-0 CT1 TAPERPNT 27 (SUTURE) ×1 IMPLANT
SUT VIC AB 2-0 CTX 27 (SUTURE) ×2 IMPLANT
SUT VIC AB 3-0 SH 18 (SUTURE) IMPLANT
SUT VIC AB 3-0 SH 27 (SUTURE) ×1
SUT VIC AB 3-0 SH 27X BRD (SUTURE) ×1 IMPLANT
SUT VIC AB 3-0 X1 27 (SUTURE) ×4 IMPLANT
SUT VICRYL 0 UR6 27IN ABS (SUTURE) IMPLANT
SWAB COLLECTION DEVICE MRSA (MISCELLANEOUS) IMPLANT
SWAB CULTURE ESWAB REG 1ML (MISCELLANEOUS) IMPLANT
SYR 10ML LL (SYRINGE) ×2 IMPLANT
SYR CONTROL 10ML LL (SYRINGE) ×2 IMPLANT
TAPE CLOTH SURG 4X10 WHT LF (GAUZE/BANDAGES/DRESSINGS) ×2 IMPLANT
TOWEL GREEN STERILE (TOWEL DISPOSABLE) ×4 IMPLANT
TOWEL GREEN STERILE FF (TOWEL DISPOSABLE) ×4 IMPLANT
TOWEL OR 17X24 6PK STRL BLUE (TOWEL DISPOSABLE) ×2 IMPLANT
TOWEL OR 17X26 10 PK STRL BLUE (TOWEL DISPOSABLE) ×2 IMPLANT
WATER STERILE IRR 1000ML POUR (IV SOLUTION) ×2 IMPLANT

## 2017-11-16 NOTE — Anesthesia Procedure Notes (Signed)
Procedure Name: Intubation Date/Time: 11/16/2017 7:36 AM Performed by: Orlie Dakin, CRNA Pre-anesthesia Checklist: Patient identified, Emergency Drugs available, Suction available, Patient being monitored and Timeout performed Patient Re-evaluated:Patient Re-evaluated prior to induction Oxygen Delivery Method: Circle system utilized Preoxygenation: Pre-oxygenation with 100% oxygen Induction Type: IV induction Ventilation: Mask ventilation without difficulty and Oral airway inserted - appropriate to patient size Laryngoscope Size: Sabra Heck and 3 Grade View: Grade I Tube type: Oral Tube size: 7.5 mm Number of attempts: 1 Airway Equipment and Method: Stylet Placement Confirmation: ETT inserted through vocal cords under direct vision,  positive ETCO2 and breath sounds checked- equal and bilateral Secured at: 22 cm Tube secured with: Tape Dental Injury: Teeth and Oropharynx as per pre-operative assessment  Comments: Noted slight resistance to 7.5 ETT sub-glottic when advanced past vocal cords.  4x4s bite block used.

## 2017-11-16 NOTE — Interval H&P Note (Signed)
History and Physical Interval Note:  11/16/2017 7:15 AM  Kara Anderson  has presented today for surgery, with the diagnosis of Mediastinal Adenopathy  The various methods of treatment have been discussed with the patient and family. After consideration of risks, benefits and other options for treatment, the patient has consented to  Procedure(s): LEFT ANTERIOR MEDIASTINOTOMY CHAMBERLAIN PROCEDURE (Left) as a surgical intervention .  The patient's history has been reviewed, patient examined, no change in status, stable for surgery.  I have reviewed the patient's chart and labs.  Questions were answered to the patient's satisfaction.     [name]

## 2017-11-16 NOTE — Anesthesia Postprocedure Evaluation (Signed)
Anesthesia Post Note  Patient: BRYNN MULGREW  Procedure(s) Performed: LEFT ANTERIOR MEDIASTINOTOMY CHAMBERLAIN PROCEDURE (Left Chest)     Patient location during evaluation: PACU Anesthesia Type: General Level of consciousness: awake and alert Pain management: pain level controlled Vital Signs Assessment: post-procedure vital signs reviewed and stable Respiratory status: spontaneous breathing, nonlabored ventilation, respiratory function stable and patient connected to nasal cannula oxygen Cardiovascular status: blood pressure returned to baseline and stable Postop Assessment: no apparent nausea or vomiting Anesthetic complications: no    Last Vitals:  Vitals:   11/16/17 1445 11/16/17 1515  BP: (!) 95/55 105/66  Pulse: 62 65  Resp:    Temp:    SpO2:  100%    Last Pain:  Vitals:   11/16/17 1515  TempSrc:   PainSc: 0-No pain                 Tiajuana Amass

## 2017-11-16 NOTE — Transfer of Care (Signed)
Immediate Anesthesia Transfer of Care Note  Patient: Kara Anderson  Procedure(s) Performed: LEFT ANTERIOR MEDIASTINOTOMY CHAMBERLAIN PROCEDURE (Left Chest)  Patient Location: PACU  Anesthesia Type:General  Level of Consciousness: awake and patient cooperative  Airway & Oxygen Therapy: Patient Spontanous Breathing and Patient connected to nasal cannula oxygen  Post-op Assessment: Report given to RN and Post -op Vital signs reviewed and stable  Post vital signs: Reviewed and stable  Last Vitals:  Vitals:   11/16/17 0608 11/16/17 1031  BP: (!) 99/51   Pulse: 66   Resp: 18   Temp: 36.8 C (!) 36.1 C  SpO2: 100%     Last Pain:  Vitals:   11/16/17 1031  TempSrc:   PainSc: 0-No pain      Patients Stated Pain Goal: 3 (87/86/76 7209)  Complications: No apparent anesthesia complications

## 2017-11-16 NOTE — Op Note (Signed)
NAMEKADIN, BERA                 ACCOUNT NO.:  [id number]  MEDICAL RECORD NO.:  [id number]  LOCATION:                                 FACILITY:  PHYSICIAN:  Revonda Standard. Roxan Hockey, M.D. DATE OF BIRTH:  DATE OF PROCEDURE:  11/16/2017 DATE OF DISCHARGE:                              OPERATIVE REPORT   PREOPERATIVE DIAGNOSIS:  Mediastinal adenopathy.  POSTOPERATIVE DIAGNOSIS:  Mediastinal adenopathy.  PROCEDURES:  Left anterior mediastinotomy (Chamberlain procedure) with biopsies of mediastinal lymph nodes and pulmonary artery.  SURGEON:  Revonda Standard. Roxan Hockey, MD.  ASSISTANT:  Olin Pia, RNFA.  ANESTHESIA:  General.  FINDINGS:  Enlarged lymph nodes in the AP window.  Frozen section showed reactive changes, no carcinoma.  Hard mass involving pulmonary artery and aorta.  Small superficial injury to visceral pleura on entering the chest.  CLINICAL NOTE:  Kara Anderson is a 41 year old woman, who presented with progressive exertional shortness of breath.  She was found to have a prominent heart murmur.  A CT of the chest showed a central soft tissue mass with narrowing of both the left and right main pulmonary arteries as well as aortopulmonary window adenopathy.  She was referred for a surgical biopsy for diagnostic purposes.  The patient was advised to undergo a left anterior mediastinotomy.  The indications, risks, benefits, and alternatives were discussed in detail with the patient. She understood and accepted the risks and agreed to proceed.  OPERATIVE NOTE:  Mr. Hohn was brought to the preoperative holding area on November 16, 2017.  Anesthesia established venous access and placed an arterial blood pressure monitoring line.  She was taken to the operating room anesthetized and intubated.  Sequential compression devices were placed on the calves for DVT prophylaxis.  Intravenous antibiotics were administered.  A transverse incision was made in the left anterior  chest over the 3rd costal cartilage.  The pectoralis muscle fibers were separated and the chest was entered through the third interspace.  While dividing the intercostal muscles and parietal pleura, a small tear was made in the visceral pleura.  This was initially sutured with a 3-0 Vicryl suture.  The mass involving the pulmonary artery was palpable, but the nodes really were not accessible from this level.  Therefore, the intercostal muscles were divided in the 2nd interspace.  The mammary artery and veins were identified and were clipped and divided.  The anatomic structures were not well defined.  The mediastinal pleura was incised.  Care was taken not to use cautery in the vicinity of the phrenic nerve.  There were 2 enlarged nodes that were purple in color in the AP window area.  The first node was removed rather easily.  It was sent for frozen section. The frozen section returned showing reactive changes, but no definitive diagnosis.  A small piece of the node was sent for AFB and fungal cultures in addition to pathology.  The larger node was deeper and the tissue was much more adherent in that area.  Dissection was relatively difficult, but the node was removed and sent for permanent pathology.  A third smaller node also was removed along with some fatty tissue and sent for  permanent pathology as well. The tissue in the aortopulmonary window and along the aorta and pulmonary artery themselves were very thickened and fibrous in appearance.  Tissue was shaved off the pulmonary artery with a 15 blade scalpel, but there was concern because this was relatively superficial so a Tru-Cut needle biopsy was performed of this area.  A 2nd Tru-Cut biopsy was performed and there was bleeding.  This was controlled initially with direct pressure.  A 4-0 Prolene horizontal mattress suture was used to stop the bleeding.  The pulmonary artery biopsies were sent for permanent pathology only given  the relatively small amount of tissue. Saline was instilled, the lung was inflated, and there was some air leak around the Vicryl suture.  An Echelon stapler with a 45-mm gold cartridge was used to staple over this visceral pleural tear.  There was no air leak after that.  A 16-French red rubber catheter was left through the incision.  The pectoralis fascia was closed with a #1 Vicryl suture.  Before tying the suture, positive pressure ventilation was given and suction was applied to the red rubber catheter which was removed and the suture was tied.  The subcutaneous tissue and skin were closed in a standard fashion.  The patient was taken from the operating room to the postanesthetic care unit extubated and in good condition.     Revonda Standard Roxan Hockey, M.D.     SCH/MEDQ  D:  11/16/2017  T:  11/16/2017  Job:  [id number]

## 2017-11-16 NOTE — Discharge Instructions (Addendum)
Do not drive or engage in heavy physical activity for 48 hours. Do not lift anything over 10 pounds for 3 weeks.  You may shower tomorrow  There is a medical adhesive over the incision. It will begin to peel off in 10-14 days  You have a prescription for oxycodone, a narcotic pain reliever. You may use as directed  You may use acetaminophen (Tylenol) or ibuprofen (Advil) in addition to, or instead of, the oxycodone.  Call [phone] if you develop chest pain, shortness of breath, fever > 101 F or notice excessive swelling, redness or drainage from the incision  My office will contact you with a follow up appointment

## 2017-11-16 NOTE — Brief Op Note (Addendum)
11/16/2017  10:36 AM  PATIENT:  Kara Anderson  41 y.o. female  PRE-OPERATIVE DIAGNOSIS:  Mediastinal Adenopathy  POST-OPERATIVE DIAGNOSIS:  Mediastinal Adenopathy  PROCEDURE:  Procedure(s): LEFT ANTERIOR MEDIASTINOTOMY CHAMBERLAIN PROCEDURE (Left)  Biopsy of mediastinal lymph nodes and pulmonary artery  SURGEON:  Surgeon(s) and Role:    * [name], MD - Primary  PHYSICIAN ASSISTANT:   ASSISTANTS: Delena Serve, RNFA   ANESTHESIA:   general  EBL:  100 mL   BLOOD ADMINISTERED:none  DRAINS: none   LOCAL MEDICATIONS USED:  NONE  SPECIMEN:  Source of Specimen:  mediastinal nodes, pulmonary artery  DISPOSITION OF SPECIMEN:  PATHOLOGY  COUNTS:  YES  TOURNIQUET:  * No tourniquets in log *  DICTATION: .Other Dictation: Dictation Number - [id number]  PLAN OF CARE: Discharge to home after PACU  PATIENT DISPOSITION:  PACU - hemodynamically stable.   Delay start of Pharmacological VTE agent (>24hrs) due to surgical blood loss or risk of bleeding: not applicable

## 2017-11-16 NOTE — Anesthesia Procedure Notes (Signed)
Arterial Line Insertion Start/End12/02/2017 7:36 AM, 11/16/2017 7:38 AM Performed by: Verdie Drown, CRNA, CRNA  Patient location: OR. Preanesthetic checklist: patient identified, IV checked, site marked, risks and benefits discussed, surgical consent, monitors and equipment checked, pre-op evaluation and timeout performed Patient sedated Right, radial was placed Catheter size: 20 G Hand hygiene performed  and maximum sterile barriers used  Allen's test indicative of satisfactory collateral circulation Attempts: 1 Procedure performed without using ultrasound guided technique. Following insertion, dressing applied and Biopatch. Post procedure assessment: normal

## 2017-11-17 ENCOUNTER — Encounter (HOSPITAL_COMMUNITY): Payer: Self-pay | Admitting: Thoracic Surgery (Cardiothoracic Vascular Surgery)

## 2017-11-17 ENCOUNTER — Encounter: Payer: BLUE CROSS/BLUE SHIELD | Admitting: Thoracic Surgery (Cardiothoracic Vascular Surgery)

## 2017-11-17 ENCOUNTER — Ambulatory Visit: Payer: BLUE CROSS/BLUE SHIELD | Admitting: Hematology and Oncology

## 2017-11-17 ENCOUNTER — Telehealth: Payer: Self-pay

## 2017-11-17 LAB — TYPE AND SCREEN
ABO/RH(D): O POS
ANTIBODY SCREEN: NEGATIVE
Unit division: 0
Unit division: 0
Unit division: 0
Unit division: 0

## 2017-11-17 LAB — BPAM RBC
BLOOD PRODUCT EXPIRATION DATE: 201812252359
Blood Product Expiration Date: 201812252359
Blood Product Expiration Date: 201812252359
Blood Product Expiration Date: 201812252359
ISSUE DATE / TIME: 201811300811
ISSUE DATE / TIME: 201812032252
ISSUE DATE / TIME: 201811300811
ISSUE DATE / TIME: 201812032338
UNIT TYPE AND RH: 5100
UNIT TYPE AND RH: 5100
Unit Type and Rh: 5100
Unit Type and Rh: 5100

## 2017-11-17 LAB — ACID FAST SMEAR (AFB): ACID FAST SMEAR - AFSCU2: NEGATIVE

## 2017-11-17 LAB — ACID FAST SMEAR (AFB, MYCOBACTERIA)

## 2017-11-17 NOTE — Telephone Encounter (Signed)
Pt called to ask to have appointment scheduled in a few days due to her being sore from surgery she has yesterday.  A scheduling message was sent.

## 2017-11-18 ENCOUNTER — Telehealth: Payer: Self-pay | Admitting: Hematology and Oncology

## 2017-11-18 NOTE — Telephone Encounter (Signed)
Scheduled appt per 12/4 sch message - patient is aware of appt date and time. 

## 2017-11-19 ENCOUNTER — Ambulatory Visit (HOSPITAL_BASED_OUTPATIENT_CLINIC_OR_DEPARTMENT_OTHER): Payer: BLUE CROSS/BLUE SHIELD | Admitting: Hematology and Oncology

## 2017-11-19 ENCOUNTER — Encounter: Payer: Self-pay | Admitting: Hematology and Oncology

## 2017-11-19 ENCOUNTER — Encounter: Payer: Self-pay | Admitting: Thoracic Surgery (Cardiothoracic Vascular Surgery)

## 2017-11-19 ENCOUNTER — Ambulatory Visit (INDEPENDENT_AMBULATORY_CARE_PROVIDER_SITE_OTHER): Payer: Self-pay | Admitting: Thoracic Surgery (Cardiothoracic Vascular Surgery)

## 2017-11-19 ENCOUNTER — Ambulatory Visit (HOSPITAL_COMMUNITY)
Admission: RE | Admit: 2017-11-19 | Discharge: 2017-11-19 | Disposition: A | Discharge status: Home / Self Care | Payer: BLUE CROSS/BLUE SHIELD | Source: Ambulatory Visit | Attending: Hematology and Oncology | Admitting: Hematology and Oncology

## 2017-11-19 ENCOUNTER — Telehealth: Payer: Self-pay | Admitting: Hematology and Oncology

## 2017-11-19 ENCOUNTER — Other Ambulatory Visit: Payer: Self-pay | Admitting: *Deleted

## 2017-11-19 ENCOUNTER — Ambulatory Visit: Payer: BLUE CROSS/BLUE SHIELD

## 2017-11-19 ENCOUNTER — Other Ambulatory Visit: Payer: Self-pay

## 2017-11-19 VITALS — BP 120/59 | HR 79 | Temp 98.1°F | Resp 18 | Ht 63.0 in | Wt 154.6 lb

## 2017-11-19 VITALS — BP 112/75 | HR 81 | Resp 18 | Ht 63.0 in | Wt 154.0 lb

## 2017-11-19 DIAGNOSIS — R590 Localized enlarged lymph nodes: Secondary | ICD-10-CM | POA: Diagnosis not present

## 2017-11-19 DIAGNOSIS — R0602 Shortness of breath: Secondary | ICD-10-CM | POA: Diagnosis not present

## 2017-11-19 DIAGNOSIS — I288 Other diseases of pulmonary vessels: Secondary | ICD-10-CM | POA: Diagnosis not present

## 2017-11-19 DIAGNOSIS — J9859 Other diseases of mediastinum, not elsewhere classified: Secondary | ICD-10-CM | POA: Diagnosis not present

## 2017-11-19 DIAGNOSIS — J90 Pleural effusion, not elsewhere classified: Secondary | ICD-10-CM

## 2017-11-19 DIAGNOSIS — I776 Arteritis, unspecified: Secondary | ICD-10-CM | POA: Diagnosis not present

## 2017-11-19 LAB — CBC WITH DIFFERENTIAL/PLATELET
BASO%: 0.2 % (ref 0.0–2.0)
BASOS ABS: 0 10*3/uL (ref 0.0–0.1)
EOS ABS: 0.1 10*3/uL (ref 0.0–0.5)
EOS%: 1.2 % (ref 0.0–7.0)
HEMATOCRIT: 31.7 % — AB (ref 34.8–46.6)
HGB: 10.1 g/dL — ABNORMAL LOW (ref 11.6–15.9)
LYMPH#: 1.2 10*3/uL (ref 0.9–3.3)
LYMPH%: 11.8 % — AB (ref 14.0–49.7)
MCH: 27.5 pg (ref 25.1–34.0)
MCHC: 31.9 g/dL (ref 31.5–36.0)
MCV: 86.4 fL (ref 79.5–101.0)
MONO#: 0.5 10*3/uL (ref 0.1–0.9)
MONO%: 5.2 % (ref 0.0–14.0)
NEUT#: 8.3 10*3/uL — ABNORMAL HIGH (ref 1.5–6.5)
NEUT%: 81.6 % — AB (ref 38.4–76.8)
PLATELETS: 331 10*3/uL (ref 145–400)
RBC: 3.67 10*6/uL — AB (ref 3.70–5.45)
RDW: 14.6 % — ABNORMAL HIGH (ref 11.2–14.5)
WBC: 10.1 10*3/uL (ref 3.9–10.3)

## 2017-11-19 MED ORDER — PREDNISONE 10 MG PO TABS: 35.0000 mg | ORAL_TABLET | Freq: Two times a day (BID) | ORAL | 0 refills | Status: AC

## 2017-11-19 MED ORDER — DOCUSATE SODIUM 100 MG PO CAPS: 200.0000 mg | ORAL_CAPSULE | Freq: Two times a day (BID) | ORAL | 0 refills | Status: DC

## 2017-11-19 MED ORDER — SENNA 8.6 MG PO TABS: 2.0000 | ORAL_TABLET | Freq: Every day | ORAL | 0 refills | Status: DC

## 2017-11-19 MED ORDER — MORPHINE SULFATE 15 MG PO TABS: 15.0000 mg | ORAL_TABLET | ORAL | 0 refills | Status: DC | PRN

## 2017-11-19 MED ORDER — POLYETHYLENE GLYCOL 3350 17 G PO PACK: 17.0000 g | PACK | Freq: Two times a day (BID) | ORAL | 0 refills | Status: DC

## 2017-11-19 NOTE — Telephone Encounter (Signed)
Gave avs and calendar ° °

## 2017-11-19 NOTE — Progress Notes (Signed)
[address]       [place] [address]             [phone]       HPI: Kara Anderson returns for an unscheduled follow up visit  She is a 41 year old woman who presented with a complaint of feeling fatigue and shortness of breath.  On evaluation she was noted have a prominent heart murmur.  CT of the chest showed a central process with narrowing of both the left and right main pulmonary arteries and mediastinal adenopathy.  On PET these areas were hypermetabolic.  MR suggested possible vasculitis.  I did a Chamberlain procedure on 11/16/2017.  Biopsies were obtained from the pulmonary artery as well as removing to mediastinal lymph nodes.  There was some slight elevation of the left hemidiaphragm on postop chest x-ray.  She saw Dr. Lebron Conners earlier this morning.  She was complaining of worsening chest pain and shortness of breath as well as constipation.  He sent off serologic testing and gave her prescription for prednisone.  Her chest x-ray showed opacity at the left base and sent her to me to evaluate that.  Past Medical History:  Diagnosis Date  . Endometriosis 2007,2009  . Mediastinal adenopathy   . Mononucleosis   . Seasonal allergies   . Strep throat   . Viral meningitis       Current Outpatient Medications  Medication Sig Dispense Refill  . aspirin EC 81 MG tablet Take 81 mg by mouth daily.    . calcium carbonate (TUMS EX) 750 MG chewable tablet Chew 2-3 tablets by mouth daily as needed for heartburn.    . cetirizine (ZYRTEC) 10 MG tablet Take 10 mg by mouth daily.    . Cyanocobalamin (VITAMIN B 12 PO) Take 3,000 mcg by mouth daily.     . diclofenac sodium (VOLTAREN) 1 % GEL Apply 1 application topically daily as needed (pain).    Marland Kitchen docusate sodium (COLACE) 100 MG capsule Take 2 capsules (200 mg total) by mouth 2 (two) times daily. 10 capsule 0  . ibuprofen (ADVIL,MOTRIN) 200 MG tablet Take 600 mg by mouth daily as needed for moderate pain.    .  montelukast (SINGULAIR) 10 MG tablet Take 10 mg daily by mouth.  2  . morphine (MSIR) 15 MG tablet Take 1 tablet (15 mg total) by mouth every 4 (four) hours as needed for severe pain. 25 tablet 0  . Multiple Vitamins-Minerals (MULTIVITAMIN GUMMIES WOMENS) CHEW Chew 2 tablets by mouth daily.     . polyethylene glycol (MIRALAX) packet Take 17 g by mouth 2 (two) times daily. 60 packet 0  . predniSONE (DELTASONE) 10 MG tablet Take 3.5 tablets (35 mg total) by mouth 2 (two) times daily with a meal. 210 tablet 0  . senna (SENOKOT) 8.6 MG TABS tablet Take 2 tablets (17.2 mg total) by mouth daily. 60 tablet 0  . Sodium Chloride-Sodium Bicarb (NETI POT SINUS WASH NA) Place 1 Dose into the nose daily.     No current facility-administered medications for this visit.     Physical Exam BP 112/75 (BP Location: Right Arm, Patient Position: Sitting, Cuff Size: Normal)   Pulse 81   Resp 18   Ht 5\' 3"  (1.6 m)   Wt 154 lb (69.9 kg)   LMP 11/12/2017 (Exact Date)   SpO2 97% Comment: on RA  BMI 27.60 kg/m  41 year old woman in no acute distress but obvious discomfort Prominent murmur heard  throughout the precordium and the entire chest Diminished breath sounds at left base Incision clean dry and intact  Diagnostic Tests: I personally reviewed the chest x-ray I think there is a significant component of elevation of the left hemidiaphragm and also likely some effusion.  Impression: Kara Anderson is a 41 year old woman with an unusual presentation with a central soft tissue density in the mediastinum with constriction of both the left and right main pulmonary arteries.  She had a Chamberlain procedure 3 days ago.  Initial path pathology is not suspicious for malignancy although flow cytometry is still pending.  Findings are most consistent with an inflammatory process such as a vasculitis with reactive adenopathy.  She will start high-dose prednisone this evening.  She was only taking 5 mg of oxycodone about 4  times a day.  Dr. Lebron Conners gave her a prescription for MS IR 15 mg every 4 hours.  She will try that instead.  If that is not effective she will try taking 10 mg of oxycodone.  I also recommended that she take around-the-clock acetaminophen or ibuprofen, but not both.  We discussed the maximum dose of each.  She will see which seems to be more effective.  I am going to schedule her for an ultrasound-guided thoracentesis with IR.  I am not comfortable attempting to drain any effusion in the office given that there is probably a component of elevation of the left hemidiaphragm.  That dysfunction of the left hemidiaphragm should improve with time but that can take months.  I do think that is contributing to her shortness of breath.  She would probably tolerate that if not for the already compromised pulmonary blood flow.  Her vital signs are stable and her oxygen saturation is 97% on room air.  I do not think she needs hospital admission at this time, but if her symptoms worsen she should call us and we would admit her for pain control and medical optimization.  Plan: Ultrasound-guided thoracentesis ASAP I will see her back in the office next week with a chest x-ray check on her progress.  [name], MD [facility] [phone]

## 2017-11-20 ENCOUNTER — Ambulatory Visit (HOSPITAL_COMMUNITY)
Admission: RE | Admit: 2017-11-20 | Discharge: 2017-11-20 | Disposition: A | Discharge status: Home / Self Care | Payer: BLUE CROSS/BLUE SHIELD | Source: Ambulatory Visit | Attending: Thoracic Surgery (Cardiothoracic Vascular Surgery) | Admitting: Thoracic Surgery (Cardiothoracic Vascular Surgery)

## 2017-11-20 ENCOUNTER — Encounter (HOSPITAL_COMMUNITY): Payer: Self-pay | Admitting: Interventional Radiology

## 2017-11-20 ENCOUNTER — Other Ambulatory Visit (HOSPITAL_COMMUNITY): Payer: Self-pay | Admitting: Physician Assistant

## 2017-11-20 ENCOUNTER — Ambulatory Visit (HOSPITAL_COMMUNITY)
Admission: RE | Admit: 2017-11-20 | Discharge: 2017-11-20 | Disposition: A | Discharge status: Home / Self Care | Payer: BLUE CROSS/BLUE SHIELD | Source: Ambulatory Visit | Attending: Physician Assistant | Admitting: Physician Assistant

## 2017-11-20 DIAGNOSIS — J90 Pleural effusion, not elsewhere classified: Secondary | ICD-10-CM

## 2017-11-20 DIAGNOSIS — J189 Pneumonia, unspecified organism: Secondary | ICD-10-CM | POA: Diagnosis not present

## 2017-11-20 DIAGNOSIS — R091 Pleurisy: Secondary | ICD-10-CM | POA: Diagnosis not present

## 2017-11-20 HISTORY — PX: IR THORACENTESIS RIGHT ASP PLEURAL SPACE W/IMG GUIDE: IMG5380

## 2017-11-20 LAB — ANTINUCLEAR ANTIBODIES, IFA: ANA Titer 1: NEGATIVE

## 2017-11-20 LAB — HEPATITIS C ANTIBODY (REFLEX): HCV Ab: <0.1 s/co ratio (ref 0.0–0.9)

## 2017-11-20 LAB — SEDIMENTATION RATE: Sedimentation Rate-Westergren: 49 mm/hr — ABNORMAL HIGH (ref 0–32)

## 2017-11-20 LAB — HEPATITIS B SURFACE ANTIBODY,QUALITATIVE: HEP B SURFACE AB, QUAL: REACTIVE

## 2017-11-20 LAB — ANCA TITERS

## 2017-11-20 LAB — HEPATITIS B SURFACE ANTIGEN: HEP B S AG: NEGATIVE

## 2017-11-20 LAB — HEPATITIS B CORE ANTIBODY, TOTAL: HEP B C TOTAL AB: NEGATIVE

## 2017-11-20 LAB — C-REACTIVE PROTEIN: CRP: 124.5 mg/L — ABNORMAL HIGH (ref 0.0–4.9)

## 2017-11-20 MED ORDER — LIDOCAINE 2% (20 MG/ML) 5 ML SYRINGE
INTRAMUSCULAR | Status: AC
  Filled 2017-11-20: qty 10

## 2017-11-20 MED ORDER — LIDOCAINE HCL 2 % IJ SOLN
INTRAMUSCULAR | Status: DC | PRN
  Administered 2017-11-20: 10 mL

## 2017-11-20 NOTE — Procedures (Signed)
PROCEDURE SUMMARY:  Successful US guided left thoracentesis. Yielded 550 mL of thin red fluid. Pt tolerated procedure well. No immediate complications.  Specimen was sent for labs.  WENDY S BLAIR PA-C 11/20/2017 2:48 PM

## 2017-11-21 LAB — AEROBIC/ANAEROBIC CULTURE W GRAM STAIN (SURGICAL/DEEP WOUND)

## 2017-11-21 LAB — AEROBIC/ANAEROBIC CULTURE (SURGICAL/DEEP WOUND): CULTURE: NO GROWTH

## 2017-11-22 NOTE — Progress Notes (Signed)
[facility] Follow-up Visit:  Assessment: Mediastinal mass 41 y.o. female with initial presentational progressive dyspnea with exertion and no shortness of breath at rest. Found to have a vague mass surrounding and exerting severe compression on the pulmonary arteries especially on the right side with associated mediastinal lymphadenopathy. Differential diagnosis includes vasculitis, mediastinal lymphoma such as primary mediastinal or Hodgkin's disease, and more rare tumor types such as angiosarcoma of the pulmonary artery.PET/CT was not tremendously helpful distinguishing the possibility allergies of the mass as it was mild hyper metabolic and no additional hyper metabolic sites were identified to offer an easier biopsy site.   At this time, the only route for diagnosis would be direct biopsy.Patient will be seen by thoracic surgery later today and will likely undergo biopsy in short order.  Plan: --Mediastinal Bx ASAP --RTC with me in 1 week  Voice recognition software was used and creation of this note. Despite my best effort at editing the text, some misspelling/errors may have occurred.   Orders Placed This Encounter  Procedures  . Ambulatory referral to Cardiothoracic Surgery    Referral Priority:   Urgent    Referral Type:   Surgical    Referral Reason:   Specialty Services Required    Referred to Provider:   Rexene Alberts, MD    Requested Specialty:   Cardiothoracic Surgery    Number of Visits Requested:   1    Cancer Staging No matching staging information was found for the patient.  All questions were answered.  . The patient knows to call the clinic with any problems, questions or concerns.  This note was electronically signed.    History of Presenting Illness Kara Anderson is a 41 y.o. female followed in the [facility] for Evaluation of mediastinal mass and lymphadenopathy. She was previously admitted for evaluation of progressive shortness  of breath over the past two months including intermittent shortness of breath At the rest.Echocardiogram obtained on 10/23/17 was essentially normal, CTA of the chest obtained on 11/04/17 Demonstrated no evidence of pulmonary emboli, but showed severe narrowing of bilateral pulmonary arteries with associated thickening of the blood vessel walls as well as a soft tissue mass in the central mediastinum as well as associated lymphadenopathy in the pre-vascular station measuring up to 1.5 cm short axis. CT demonstrated no supraclavicular or excellent for than apathy, no skeletal lesions. Additional assessment with CT of the abdomen and pelvis demonstrated no evidence of retroperitoneal, pelvic, or inguinal lymphadenopathy and no skeletal lesions. MRI of the chestWas supportive of larger vessel vasculitis/arthritis with Perivascular soft tissue And lymphadenopathy attributed to the inflammatory process.  Prior to admission, patient reports weight loss, increasing fatigue, and decreased activity tolerance to defer some breath. She denies active chest pain, palpitations, or cough. Denies dysphasia, nausea, vomiting, abdominal pain, diarrhea, or constipation. No visual deficits, no new focal weakness or sensory deficits in the extremities or face. No dysuria, hematuria. No musculoskeletal complaints and no genitourinary complaints.  At the present time, patient is complaining of worsening dyspnea on exertion and increased generalized by the achiness. Yesterday, she had a lot of lightheadedness and dizziness which is not present today. Patient has new achy pain in the mid back.   Medical History: Past Medical History:  Diagnosis Date  . Endometriosis 2007,2009  . Mediastinal adenopathy   . Mononucleosis   . Seasonal allergies   . Strep throat   . Viral meningitis     Surgical History: Past Surgical  History:  Procedure Laterality Date  . HIP SURGERY  2013  . IR THORACENTESIS ASP PLEURAL SPACE W/IMG  GUIDE  11/20/2017  . LAPAROSCOPIC ABDOMINAL EXPLORATION  2007,2009   endometriosis  . MEDIASTINOTOMY CHAMBERLAIN MCNEIL Left 11/16/2017   Procedure: LEFT ANTERIOR MEDIASTINOTOMY CHAMBERLAIN PROCEDURE;  Surgeon: [name], MD;  Location: [facility];  Service: Thoracic;  Laterality: Left;  . NOSE SURGERY  2007    Family History: Family History  Problem Relation Age of Onset  . Heart disease Mother   . Stroke Mother   . Diabetes Mother   . Heart disease Father   . Alcohol abuse Father     Social History: Social History   Socioeconomic History  . Marital status: Married    Spouse name: Not on file  . Number of children: Not on file  . Years of education: Not on file  . Highest education level: Not on file  Social Needs  . Financial resource strain: Not on file  . Food insecurity - worry: Not on file  . Food insecurity - inability: Not on file  . Transportation needs - medical: Not on file  . Transportation needs - non-medical: Not on file  Occupational History  . Not on file  Tobacco Use  . Smoking status: Never Smoker  . Smokeless tobacco: Never Used  Substance and Sexual Activity  . Alcohol use: No    Alcohol/week: 0.0 oz  . Drug use: No  . Sexual activity: Not on file  Other Topics Concern  . Not on file  Social History Narrative  . Not on file    Allergies: Allergies  Allergen Reactions  . Codeine Nausea Only    Medications:  Current Outpatient Medications  Medication Sig Dispense Refill  . cetirizine (ZYRTEC) 10 MG tablet Take 10 mg by mouth daily.    . Cyanocobalamin (VITAMIN B 12 PO) Take 3,000 mcg by mouth daily.     . montelukast (SINGULAIR) 10 MG tablet Take 10 mg daily by mouth.  2  . Multiple Vitamins-Minerals (MULTIVITAMIN GUMMIES WOMENS) CHEW Chew 2 tablets by mouth daily.     Marland Kitchen aspirin EC 81 MG tablet Take 81 mg by mouth daily.    . calcium carbonate (TUMS EX) 750 MG chewable tablet Chew 2-3 tablets by mouth daily as needed for  heartburn.    . diclofenac sodium (VOLTAREN) 1 % GEL Apply 1 application topically daily as needed (pain).    Marland Kitchen docusate sodium (COLACE) 100 MG capsule Take 2 capsules (200 mg total) by mouth 2 (two) times daily. 10 capsule 0  . ibuprofen (ADVIL,MOTRIN) 200 MG tablet Take 600 mg by mouth daily as needed for moderate pain.    Marland Kitchen morphine (MSIR) 15 MG tablet Take 1 tablet (15 mg total) by mouth every 4 (four) hours as needed for severe pain. 25 tablet 0  . polyethylene glycol (MIRALAX) packet Take 17 g by mouth 2 (two) times daily. 60 packet 0  . predniSONE (DELTASONE) 10 MG tablet Take 3.5 tablets (35 mg total) by mouth 2 (two) times daily with a meal. 210 tablet 0  . senna (SENOKOT) 8.6 MG TABS tablet Take 2 tablets (17.2 mg total) by mouth daily. 60 tablet 0  . Sodium Chloride-Sodium Bicarb (NETI POT SINUS WASH NA) Place 1 Dose into the nose daily.     No current facility-administered medications for this visit.     Review of Systems: Review of Systems  Constitutional: Positive for fatigue. Negative for appetite change,  chills, diaphoresis, fever and unexpected weight change.  HENT:  Negative.   Eyes: Negative.   Respiratory: Positive for shortness of breath. Negative for chest tightness, cough, hemoptysis and wheezing.   Cardiovascular: Negative.   Gastrointestinal: Negative.   Endocrine: Negative.   Genitourinary: Negative.    Musculoskeletal: Positive for back pain and myalgias. Negative for gait problem.  Skin: Negative.   Neurological: Positive for dizziness and light-headedness. Negative for extremity weakness and gait problem.  Hematological: Negative.   Psychiatric/Behavioral: Negative.      PHYSICAL EXAMINATION Blood pressure (!) 106/46, pulse 64, temperature 98.1 F (36.7 C), temperature source Oral, resp. rate 16, last menstrual period 11/08/2017, SpO2 100 %.  ECOG PERFORMANCE STATUS: 2 - Symptomatic, <50% confined to bed  Physical Exam  Constitutional: She is  oriented to person, place, and time. She appears distressed.  HENT:  Head: Normocephalic and atraumatic.  Mouth/Throat: Oropharynx is clear and moist. No oropharyngeal exudate.  Eyes: Conjunctivae and EOM are normal. Pupils are equal, round, and reactive to light. No scleral icterus.  Neck: Normal range of motion. Neck supple. No thyromegaly present.  Cardiovascular: Normal rate and regular rhythm.  Murmur heard. Core systolic murmur heard throughout the precordium  Pulmonary/Chest: Effort normal and breath sounds normal. No respiratory distress. She has no wheezes.  Abdominal: Soft. Bowel sounds are normal. She exhibits no distension. There is no tenderness. There is no rebound.  Musculoskeletal: She exhibits no edema.  Lymphadenopathy:    She has no cervical adenopathy.  Neurological: She is alert and oriented to person, place, and time. She has normal reflexes. No cranial nerve deficit.  Skin: She is not diaphoretic.     LABORATORY DATA: I have personally reviewed the data as listed: Appointment on 11/09/2017  Component Date Value Ref Range Status  . CRP 11/09/2017 19.4* 0.0 - 4.9 mg/L Final  . Sedimentation Rate-Westergren 11/09/2017 40* 0 - 32 mm/hr Final  . Beta-2 11/09/2017 1.4  0.6 - 2.4 mg/L Final   Siemens Immulite 2000 Immunochemiluminometric assay (ICMA)  . Uric Acid, Serum 11/09/2017 3.5  2.6 - 7.4 mg/dl Final  . LDH 11/09/2017 110* 125 - 245 U/L Final  . Sodium 11/09/2017 137  136 - 145 mEq/L Final  . Potassium 11/09/2017 4.3  3.5 - 5.1 mEq/L Final  . Chloride 11/09/2017 101  98 - 109 mEq/L Final  . CO2 11/09/2017 27  22 - 29 mEq/L Final  . Glucose 11/09/2017 73  70 - 140 mg/dl Final   Glucose reference range is for nonfasting patients. Fasting glucose reference range is 70- 100.  Marland Kitchen BUN 11/09/2017 13.7  7.0 - 26.0 mg/dL Final  . Creatinine 11/09/2017 0.8  0.6 - 1.1 mg/dL Final  . Total Bilirubin 11/09/2017 0.33  0.20 - 1.20 mg/dL Final  . Alkaline Phosphatase  11/09/2017 67  40 - 150 U/L Final  . AST 11/09/2017 14  5 - 34 U/L Final  . ALT 11/09/2017 9  0 - 55 U/L Final  . Total Protein 11/09/2017 8.6* 6.4 - 8.3 g/dL Final  . Albumin 11/09/2017 3.6  3.5 - 5.0 g/dL Final  . Calcium 11/09/2017 9.8  8.4 - 10.4 mg/dL Final  . Anion Gap 11/09/2017 8  3 - 11 mEq/L Final  . EGFR 11/09/2017 >60  >60 ml/min/1.73 m2 Final   eGFR is calculated using the CKD-EPI Creatinine Equation (2009)  . WBC 11/09/2017 7.2  3.9 - 10.3 10e3/uL Final  . NEUT# 11/09/2017 4.7  1.5 - 6.5 10e3/uL Final  .  HGB 11/09/2017 11.6  11.6 - 15.9 g/dL Final  . HCT 11/09/2017 36.1  34.8 - 46.6 % Final  . Platelets 11/09/2017 331  145 - 400 10e3/uL Final  . MCV 11/09/2017 86.2  79.5 - 101.0 fL Final  . MCH 11/09/2017 27.7  25.1 - 34.0 pg Final  . MCHC 11/09/2017 32.1  31.5 - 36.0 g/dL Final  . RBC 11/09/2017 4.19  3.70 - 5.45 10e6/uL Final  . RDW 11/09/2017 14.3  11.2 - 14.5 % Final  . lymph# 11/09/2017 1.8  0.9 - 3.3 10e3/uL Final  . MONO# 11/09/2017 0.4  0.1 - 0.9 10e3/uL Final  . Eosinophils Absolute 11/09/2017 0.2  0.0 - 0.5 10e3/uL Final  . Basophils Absolute 11/09/2017 0.0  0.0 - 0.1 10e3/uL Final  . NEUT% 11/09/2017 65.5  38.4 - 76.8 % Final  . LYMPH% 11/09/2017 25.2  14.0 - 49.7 % Final  . MONO% 11/09/2017 6.1  0.0 - 14.0 % Final  . EOS% 11/09/2017 2.6  0.0 - 7.0 % Final  . BASO% 11/09/2017 0.6  0.0 - 2.0 % Final  Admission on 11/04/2017, Discharged on 11/06/2017  Component Date Value Ref Range Status  . HIV Screen 4th Generation wRfx 11/04/2017 Non Reactive  Non Reactive Final   Comment: (NOTE) Performed At: [facility] [address] [place], [place] [id number] Rush Farmer MD JT:[id number]   . Sodium 11/04/2017 135  135 - 145 mmol/L Final  . Potassium 11/04/2017 3.7  3.5 - 5.1 mmol/L Final  . Chloride 11/04/2017 100* 101 - 111 mmol/L Final  . CO2 11/04/2017 27  22 - 32 mmol/L Final  . Glucose, Bld 11/04/2017 100* 65 - 99 mg/dL Final  . BUN 11/04/2017  15  6 - 20 mg/dL Final  . Creatinine, Ser 11/04/2017 0.68  0.44 - 1.00 mg/dL Final  . Calcium 11/04/2017 9.5  8.9 - 10.3 mg/dL Final  . Total Protein 11/04/2017 8.1  6.5 - 8.1 g/dL Final  . Albumin 11/04/2017 3.5  3.5 - 5.0 g/dL Final  . AST 11/04/2017 15  15 - 41 U/L Final  . ALT 11/04/2017 11* 14 - 54 U/L Final  . Alkaline Phosphatase 11/04/2017 60  38 - 126 U/L Final  . Total Bilirubin 11/04/2017 0.8  0.3 - 1.2 mg/dL Final  . GFR calc non Af Amer 11/04/2017 >60  >60 mL/min Final  . GFR calc Af Amer 11/04/2017 >60  >60 mL/min Final   Comment: (NOTE) The eGFR has been calculated using the CKD EPI equation. This calculation has not been validated in all clinical situations. eGFR's persistently <60 mL/min signify possible Chronic Kidney Disease.   . Anion gap 11/04/2017 8  5 - 15 Final  . WBC 11/04/2017 12.1* 4.0 - 10.5 K/uL Final  . RBC 11/04/2017 4.15  3.87 - 5.11 MIL/uL Final  . Hemoglobin 11/04/2017 11.4* 12.0 - 15.0 g/dL Final  . HCT 11/04/2017 35.1* 36.0 - 46.0 % Final  . MCV 11/04/2017 84.6  78.0 - 100.0 fL Final  . MCH 11/04/2017 27.5  26.0 - 34.0 pg Final  . MCHC 11/04/2017 32.5  30.0 - 36.0 g/dL Final  . RDW 11/04/2017 14.1  11.5 - 15.5 % Final  . Platelets 11/04/2017 336  150 - 400 K/uL Final  . LDH 11/04/2017 98  98 - 192 U/L Final  . Sed Rate 11/04/2017 51* 0 - 22 mm/hr Final  . CRP 11/04/2017 1.5* <1.0 mg/dL Final  . TSH 11/04/2017 4.090  0.350 - 4.500 uIU/mL Final  Performed by a 3rd Generation assay with a functional sensitivity of <=0.01 uIU/mL.  . CEA 11/04/2017 0.8  0.0 - 4.7 ng/mL Final   Comment: (NOTE)       Roche ECLIA methodology       Nonsmokers  <3.9                                     Smokers     <5.6 Performed At: [facility] [place], [place] [id number] Rush Farmer MD [id number]   . AFP, Serum, Tumor Marker 11/04/2017 1.6  0.0 - 8.3 ng/mL Final   Comment: (NOTE) Roche ECLIA methodology Performed At: [facility] [address] [place], [place] [id number] Rush Farmer MD [id number]   . Angiotensin-Converting Enzyme 11/04/2017 50  14 - 82 U/L Final   Comment: (NOTE) Performed At: [facility] [place], [place] [id number] Rush Farmer MD [id number]   . Preg, Serum 11/04/2017 NEGATIVE  NEGATIVE Final   Comment:        THE SENSITIVITY OF THIS METHODOLOGY IS >10 mIU/mL.   Marland Kitchen 5-HIAA, Ur 11/04/2017 1.5  Undefined mg/L Final  . 5-HIAA,Quant.,24 Hr Urine 11/04/2017 3.6  0.0 - 14.9 mg/24 hr Final   Comment: (NOTE) This test was developed and its performance characteristics determined by [facility]. It has not been cleared or approved by the Food and Drug Administration. Performed At: [facility] [place], [place] [id number] Rush Farmer MD DK:[id number]   . Total Volume 11/04/2017 SVOL 2400 ML   Final  . MRSA by PCR 11/04/2017 NEGATIVE  NEGATIVE Final   Comment:        The GeneXpert MRSA Assay (FDA approved for NASAL specimens only), is one component of a comprehensive MRSA colonization surveillance program. It is not intended to diagnose MRSA infection nor to guide or monitor treatment for MRSA infections.        Ardath Sax, MD

## 2017-11-22 NOTE — Assessment & Plan Note (Addendum)
41 y.o. female with initial presentational progressive dyspnea with exertion and no shortness of breath at rest. Found to have a vague mass surrounding and exerting severe compression on the pulmonary arteries especially on the right side with associated mediastinal lymphadenopathy. Differential diagnosis includes vasculitis, mediastinal lymphoma such as primary mediastinal or Hodgkin's disease, and more rare tumor types such as angiosarcoma of the pulmonary artery.PET/CT was not tremendously helpful distinguishing the possibility allergies of the mass as it was mild hyper metabolic and no additional hyper metabolic sites were identified to offer an easier biopsy site.   At this time, the only route for diagnosis would be direct biopsy.Patient will be seen by thoracic surgery later today and will likely undergo biopsy in short order.  Plan: --Mediastinal Bx ASAP --RTC with me in 1 week

## 2017-11-23 ENCOUNTER — Other Ambulatory Visit: Payer: Self-pay | Admitting: Thoracic Surgery (Cardiothoracic Vascular Surgery)

## 2017-11-23 ENCOUNTER — Ambulatory Visit: Payer: BLUE CROSS/BLUE SHIELD | Admitting: Cardiology

## 2017-11-23 DIAGNOSIS — I370 Nonrheumatic pulmonary valve stenosis: Secondary | ICD-10-CM

## 2017-11-24 ENCOUNTER — Ambulatory Visit: Payer: BLUE CROSS/BLUE SHIELD | Admitting: Thoracic Surgery (Cardiothoracic Vascular Surgery)

## 2017-11-24 ENCOUNTER — Ambulatory Visit
Admission: RE | Admit: 2017-11-24 | Discharge: 2017-11-24 | Disposition: A | Discharge status: Home / Self Care | Payer: BLUE CROSS/BLUE SHIELD | Source: Ambulatory Visit | Attending: Thoracic Surgery (Cardiothoracic Vascular Surgery) | Admitting: Thoracic Surgery (Cardiothoracic Vascular Surgery)

## 2017-11-24 ENCOUNTER — Encounter: Payer: Self-pay | Admitting: Thoracic Surgery (Cardiothoracic Vascular Surgery)

## 2017-11-24 ENCOUNTER — Other Ambulatory Visit: Payer: Self-pay

## 2017-11-24 ENCOUNTER — Ambulatory Visit (INDEPENDENT_AMBULATORY_CARE_PROVIDER_SITE_OTHER): Payer: Self-pay | Admitting: Thoracic Surgery (Cardiothoracic Vascular Surgery)

## 2017-11-24 ENCOUNTER — Ambulatory Visit: Payer: BLUE CROSS/BLUE SHIELD | Admitting: Cardiovascular Disease

## 2017-11-24 VITALS — BP 131/77 | HR 65 | Ht 63.0 in | Wt 154.0 lb

## 2017-11-24 DIAGNOSIS — I370 Nonrheumatic pulmonary valve stenosis: Secondary | ICD-10-CM

## 2017-11-24 DIAGNOSIS — J939 Pneumothorax, unspecified: Secondary | ICD-10-CM | POA: Diagnosis not present

## 2017-11-24 DIAGNOSIS — R0602 Shortness of breath: Secondary | ICD-10-CM | POA: Diagnosis not present

## 2017-11-24 DIAGNOSIS — I776 Arteritis, unspecified: Secondary | ICD-10-CM

## 2017-11-24 DIAGNOSIS — J948 Other specified pleural conditions: Secondary | ICD-10-CM

## 2017-11-24 DIAGNOSIS — R079 Chest pain, unspecified: Secondary | ICD-10-CM | POA: Diagnosis not present

## 2017-11-24 MED ORDER — MORPHINE SULFATE 15 MG PO TABS: 15.0000 mg | ORAL_TABLET | ORAL | 0 refills | Status: DC | PRN

## 2017-11-24 NOTE — Progress Notes (Signed)
[address]       [place] [address]             [phone]    HPI: Kara Anderson returns for a scheduled follow-up  She is a 41 year old woman who presented with fatigue and shortness of breath.  She was noted to have a prominent heart murmur.  CT of the chest showed a central soft tissue mass with narrowing of the main pulmonary arteries and mediastinal adenopathy.  These areas were hypermetabolic on PET.  MR suggested a possible vasculitis.  I did a Chamberlain on 11/16/2017.  Biopsies of the pulmonary artery and removal of lymph nodes showed no evidence of malignancy.  Findings were consistent with vasculitis.  She saw Dr. Lebron Conners of oncology.  He made a referral to rheumatology Dr. Trudie Reed.  She was started on prednisone 35 mg twice daily.  I saw her in the office on 12/6.  He had a moderate left pleural effusion.  She had a thoracentesis the following day.  500 mL of fluid was drained.  Her post drainage films showed no residual effusion or pneumothorax.  She says that she had a lot of pain with the thoracentesis.  Afterwards she noted that she could talk longer without feeling winded.  She says that her breathing has not worsened appreciably since last Thursday.  She is frustrated with what she perceives as lack of definitive answers.  Past Medical History:  Diagnosis Date  . Endometriosis 2007,2009  . Mediastinal adenopathy   . Mononucleosis   . Seasonal allergies   . Strep throat   . Viral meningitis     Current Outpatient Medications  Medication Sig Dispense Refill  . acetaminophen (TYLENOL) 500 MG tablet Take 1,000 mg by mouth every 6 (six) hours as needed.    Marland Kitchen aspirin EC 81 MG tablet Take 81 mg by mouth daily.    . cetirizine (ZYRTEC) 10 MG tablet Take 10 mg by mouth daily.    . Cyanocobalamin (VITAMIN B 12 PO) Take 3,000 mcg by mouth daily.     Marland Kitchen MAGNESIUM CITRATE PO Take by mouth as needed.    . montelukast (SINGULAIR) 10 MG tablet Take 10 mg daily  by mouth.  2  . morphine (MSIR) 15 MG tablet Take 1 tablet (15 mg total) by mouth every 4 (four) hours as needed for severe pain. 25 tablet 0  . Multiple Vitamins-Minerals (MULTIVITAMIN GUMMIES WOMENS) CHEW Chew 2 tablets by mouth daily.     . polyethylene glycol (MIRALAX) packet Take 17 g by mouth 2 (two) times daily. 60 packet 0  . predniSONE (DELTASONE) 10 MG tablet Take 3.5 tablets (35 mg total) by mouth 2 (two) times daily with a meal. 210 tablet 0  . Sodium Chloride-Sodium Bicarb (NETI POT SINUS WASH NA) Place 1 Dose into the nose daily.    . calcium carbonate (TUMS EX) 750 MG chewable tablet Chew 2-3 tablets by mouth daily as needed for heartburn.    . diclofenac sodium (VOLTAREN) 1 % GEL Apply 1 application topically daily as needed (pain).    Marland Kitchen docusate sodium (COLACE) 100 MG capsule Take 2 capsules (200 mg total) by mouth 2 (two) times daily. (Patient not taking: Reported on 11/24/2017) 10 capsule 0  . ibuprofen (ADVIL,MOTRIN) 200 MG tablet Take 600 mg by mouth daily as needed for moderate pain.     No current facility-administered medications for this visit.     Physical Exam BP 131/77  Pulse 65   Ht 5\' 3"  (1.6 m)   Wt 154 lb (69.9 kg)   LMP 11/12/2017 (Exact Date) Comment: pt shielded  SpO2 98%   BMI 27.39 kg/m  41 year old woman in no acute distress Alert and oriented x3 with no focal deficits Diminished breath sounds left chest Cardiac murmur less prominent bilaterally left greater than right, not as high-pitched  Diagnostic Tests: CHEST  2 VIEW  COMPARISON:  11/20/2006  FINDINGS: There is a moderate-sized left hydropneumothorax. The pneumothorax component is likely approximately 15% with moderate to large left effusion. Left lower lobe atelectasis. Right lung is clear. Heart is normal size.  IMPRESSION: Left hydropneumothorax as above. Moderate to large left effusion with approximately 15% left pneumothorax.   Electronically Signed   By: Rolm Baptise  M.D.   On: 11/24/2017 12:41 I personally reviewed the chest x-ray concur with the findings noted above.  Impression: Kara Anderson is a 41 year old woman with a large vessel vasculitis with significant narrowing of both main pulmonary arteries.  She developed a pleural effusion after a Chamberlain procedure.  She had a thoracentesis last week.  Her post thoracentesis films looked good, but surprisingly today's x-ray shows a hydropneumothorax.  She is minimally symptomatic from this and I do not think that a tube is warranted at this time, but she does need early follow-up.  I will plan to see her back in 2 days with a repeat chest x-ray.  She knows to call if she experiences increasing pain or shortness of breath in the meantime.  Vasculitis-her murmur is less prominent on exam today.  This is particularly notable on the left side. Although the pneumothorax may be playing into that to some degree, it is not as loud or as high-pitched on the right side either.  Hopefully that is indicative of a response to prednisone.  She saw Dr. Trudie Reed and apparently has been referred to a rheumatologist at [facility] but is unclear if that referral has actually been made.  She is requesting a refill on her MS IR.  I gave her a prescription for MS IR, 15 mg, 25 tablets 1 every 4 hours as needed for pain  Plan: Return on Thursday 12/13 with chest x-ray  [name], MD [facility] [phone]

## 2017-11-25 ENCOUNTER — Encounter: Payer: Self-pay | Admitting: Thoracic Surgery (Cardiothoracic Vascular Surgery)

## 2017-11-25 ENCOUNTER — Ambulatory Visit (HOSPITAL_COMMUNITY)
Admission: RE | Admit: 2017-11-25 | Discharge: 2017-11-25 | Disposition: A | Discharge status: Home / Self Care | Payer: BLUE CROSS/BLUE SHIELD | Source: Ambulatory Visit | Attending: Student | Admitting: Student

## 2017-11-25 ENCOUNTER — Other Ambulatory Visit: Payer: Self-pay | Admitting: Thoracic Surgery (Cardiothoracic Vascular Surgery)

## 2017-11-25 ENCOUNTER — Ambulatory Visit (HOSPITAL_COMMUNITY)
Admission: RE | Admit: 2017-11-25 | Discharge: 2017-11-25 | Disposition: A | Discharge status: Home / Self Care | Payer: BLUE CROSS/BLUE SHIELD | Source: Ambulatory Visit | Attending: Thoracic Surgery (Cardiothoracic Vascular Surgery) | Admitting: Thoracic Surgery (Cardiothoracic Vascular Surgery)

## 2017-11-25 ENCOUNTER — Other Ambulatory Visit: Payer: Self-pay

## 2017-11-25 ENCOUNTER — Telehealth: Payer: Self-pay

## 2017-11-25 DIAGNOSIS — J948 Other specified pleural conditions: Secondary | ICD-10-CM | POA: Insufficient documentation

## 2017-11-25 DIAGNOSIS — J9859 Other diseases of mediastinum, not elsewhere classified: Secondary | ICD-10-CM

## 2017-11-25 DIAGNOSIS — J939 Pneumothorax, unspecified: Secondary | ICD-10-CM | POA: Diagnosis not present

## 2017-11-25 DIAGNOSIS — J90 Pleural effusion, not elsewhere classified: Secondary | ICD-10-CM | POA: Diagnosis not present

## 2017-11-25 NOTE — Telephone Encounter (Signed)
Patient called requesting her office notes faxed to [facility] at [facility]. Records faxed to [phone].

## 2017-11-25 NOTE — Progress Notes (Signed)
Kara Anderson called the office this morning with c/o chest pain and shortness of breath.  Came to IR for possible thoracentesis  Repeat CXR unchanged from yestrerday  She is stable and in no acute distress  Diminished BS on left but o/w clear. Cardiac RRR, murmur unchanged form yesterday  Offered to admit for observation, but she does not want to come into the hospital.  Will see her in the office Friday  She knows to call if symptoms worsen  Remo Lipps C. Roxan Hockey, MD [facility] [phone]

## 2017-11-26 ENCOUNTER — Ambulatory Visit: Payer: BLUE CROSS/BLUE SHIELD | Admitting: Thoracic Surgery (Cardiothoracic Vascular Surgery)

## 2017-11-26 ENCOUNTER — Encounter: Payer: Self-pay | Admitting: Cardiovascular Disease

## 2017-11-26 LAB — QUANTIFERON-TB GOLD PLUS
QUANTIFERON MITOGEN VALUE: 4.64 [IU]/mL
QUANTIFERON NIL VALUE: 0.06 [IU]/mL
QUANTIFERON-TB GOLD PLUS: NEGATIVE
QuantiFERON TB1 Ag Value: 0.07 IU/mL
QuantiFERON TB2 Ag Value: 0.07 IU/mL

## 2017-11-26 NOTE — Telephone Encounter (Signed)
This encounter was created in error - please disregard.

## 2017-11-27 ENCOUNTER — Other Ambulatory Visit: Payer: Self-pay

## 2017-11-27 ENCOUNTER — Encounter: Payer: Self-pay | Admitting: Thoracic Surgery (Cardiothoracic Vascular Surgery)

## 2017-11-27 ENCOUNTER — Ambulatory Visit
Admission: RE | Admit: 2017-11-27 | Discharge: 2017-11-27 | Disposition: A | Discharge status: Home / Self Care | Payer: BLUE CROSS/BLUE SHIELD | Source: Ambulatory Visit | Attending: Thoracic Surgery (Cardiothoracic Vascular Surgery) | Admitting: Thoracic Surgery (Cardiothoracic Vascular Surgery)

## 2017-11-27 ENCOUNTER — Ambulatory Visit (INDEPENDENT_AMBULATORY_CARE_PROVIDER_SITE_OTHER): Payer: Self-pay | Admitting: Thoracic Surgery (Cardiothoracic Vascular Surgery)

## 2017-11-27 VITALS — BP 139/93 | HR 87 | Resp 18 | Ht 63.0 in | Wt 154.0 lb

## 2017-11-27 DIAGNOSIS — J9859 Other diseases of mediastinum, not elsewhere classified: Secondary | ICD-10-CM

## 2017-11-27 DIAGNOSIS — R0602 Shortness of breath: Secondary | ICD-10-CM | POA: Diagnosis not present

## 2017-11-27 DIAGNOSIS — J948 Other specified pleural conditions: Secondary | ICD-10-CM

## 2017-11-27 NOTE — Progress Notes (Signed)
PCP is Doree Albee, MD Referring Provider is Ardath Sax, MD  No chief complaint on file.   HPI: Mrs. Kara Anderson returns for a follow up appointment  She is a 41 yo woman previously healthy who presented with shortness of breath and fatigue.  She was noted to have a prominent heart murmur heard throughout her entire chest.  CT of the chest showed a central soft tissue mass with narrowing of the main pulmonary artery and mediastinal adenopathy.  These areas were hypermetabolic on PET/CT.  MR suggested vasculitis.  I did a Chamberlain procedure with biopsy of the pulmonary artery and removal of lymph nodes on 11/16/2017.  Saw her back on 11/19/2017 she had a moderate left pleural effusion.  A thoracentesis was done which was uncomplicated.  On 11/24/2017 her chest x-ray showed a hydropneumothorax.  We elected to follow that.  Today she feels a little better.  She was having a lot of nausea but that is resolved.  She is still short of breath.  That has not changed appreciably.   Past Medical History:  Diagnosis Date  . Endometriosis 2007,2009  . Mediastinal adenopathy   . Mononucleosis   . Seasonal allergies   . Strep throat   . Viral meningitis     Past Surgical History:  Procedure Laterality Date  . HIP SURGERY  2013  . IR THORACENTESIS ASP PLEURAL SPACE W/IMG GUIDE  11/20/2017  . LAPAROSCOPIC ABDOMINAL EXPLORATION  2007,2009   endometriosis  . MEDIASTINOTOMY CHAMBERLAIN MCNEIL Left 11/16/2017   Procedure: LEFT ANTERIOR MEDIASTINOTOMY CHAMBERLAIN PROCEDURE;  Surgeon: [name], MD;  Location: [place];  Service: Thoracic;  Laterality: Left;  . NOSE SURGERY  2007    Family History  Problem Relation Age of Onset  . Heart disease Mother   . Stroke Mother   . Diabetes Mother   . Heart disease Father   . Alcohol abuse Father     Social History Social History   Tobacco Use  . Smoking status: Never Smoker  . Smokeless tobacco: Never Used  Substance Use Topics   . Alcohol use: No    Alcohol/week: 0.0 oz  . Drug use: No    Current Outpatient Medications  Medication Sig Dispense Refill  . acetaminophen (TYLENOL) 500 MG tablet Take 1,000 mg by mouth every 6 (six) hours as needed.    Marland Kitchen aspirin EC 81 MG tablet Take 81 mg by mouth daily.    . calcium carbonate (TUMS EX) 750 MG chewable tablet Chew 2-3 tablets by mouth daily as needed for heartburn.    . cetirizine (ZYRTEC) 10 MG tablet Take 10 mg by mouth daily.    . Cyanocobalamin (VITAMIN B 12 PO) Take 3,000 mcg by mouth daily.     . diclofenac sodium (VOLTAREN) 1 % GEL Apply 1 application topically daily as needed (pain).    Marland Kitchen docusate sodium (COLACE) 100 MG capsule Take 2 capsules (200 mg total) by mouth 2 (two) times daily. (Patient not taking: Reported on 11/24/2017) 10 capsule 0  . ibuprofen (ADVIL,MOTRIN) 200 MG tablet Take 600 mg by mouth daily as needed for moderate pain.    Marland Kitchen MAGNESIUM CITRATE PO Take by mouth as needed.    . montelukast (SINGULAIR) 10 MG tablet Take 10 mg daily by mouth.  2  . morphine (MSIR) 15 MG tablet Take 1 tablet (15 mg total) by mouth every 4 (four) hours as needed for severe pain. 25 tablet 0  . Multiple Vitamins-Minerals (MULTIVITAMIN GUMMIES  WOMENS) CHEW Chew 2 tablets by mouth daily.     . polyethylene glycol (MIRALAX) packet Take 17 g by mouth 2 (two) times daily. 60 packet 0  . predniSONE (DELTASONE) 10 MG tablet Take 3.5 tablets (35 mg total) by mouth 2 (two) times daily with a meal. 210 tablet 0  . Sodium Chloride-Sodium Bicarb (NETI POT SINUS WASH NA) Place 1 Dose into the nose daily.     No current facility-administered medications for this visit.     Allergies  Allergen Reactions  . Codeine Nausea Only    Review of Systems See HPI  LMP 11/12/2017 (Exact Date) Comment: pt shielded Physical Exam BP (!) 139/93   Pulse 87   Resp 18   Ht 5\' 3"  (1.6 m)   Wt 154 lb (69.9 kg)   LMP 11/12/2017 (Exact Date) Comment: pt shielded  SpO2 97% Comment: on  RA  BMI 27.98 kg/m  41 year old woman in no acute distress Alert and oriented x3 with no focal deficits Lungs absent breath sounds left base, otherwise equal bilaterally, no wheezing Cardiac systolic murmur audible throughout the precordium lower pitched than on her initial exam.  Diagnostic Tests: CHEST  2 VIEW  COMPARISON:  11/25/2017  FINDINGS: Cardiac shadow is stable. Right lung is clear. Persistent hydropneumothorax is noted on the left. The pneumothorax component has reduced significantly in the interval from the prior exam. Elevation of left hemidiaphragm is again seen.  IMPRESSION: Significant reduction in the pneumothorax component of the left hydropneumothorax.   Electronically Signed   By: Inez Catalina M.D.   On: 11/27/2017 13:52 I personally reviewed the chest x-ray and concur with the findings noted above  Impression: 41 year old woman with large cell vasculitis involving both main pulmonary arteries with reactive adenopathy.  She has been started on prednisone.  Her murmur has a slightly lower pitch than it did previously hopefully that is an indication that the vasculitis is responding to prednisone to some degree.  She is still very short of breath.  Her hydropneumothorax is improved on today's film compared to Wednesday.  She is scheduled to see a rheumatologist at [facility] next Thursday.  I will plan to see her back next Friday with a PA and lateral chest x-ray   [name], MD [facility] [phone]

## 2017-12-03 ENCOUNTER — Other Ambulatory Visit: Payer: Self-pay | Admitting: Thoracic Surgery (Cardiothoracic Vascular Surgery)

## 2017-12-03 DIAGNOSIS — R590 Localized enlarged lymph nodes: Secondary | ICD-10-CM | POA: Diagnosis not present

## 2017-12-03 DIAGNOSIS — R809 Proteinuria, unspecified: Secondary | ICD-10-CM | POA: Diagnosis not present

## 2017-12-03 DIAGNOSIS — J9859 Other diseases of mediastinum, not elsewhere classified: Secondary | ICD-10-CM

## 2017-12-03 DIAGNOSIS — R0609 Other forms of dyspnea: Secondary | ICD-10-CM | POA: Diagnosis not present

## 2017-12-04 ENCOUNTER — Other Ambulatory Visit: Payer: Self-pay

## 2017-12-04 ENCOUNTER — Encounter: Payer: Self-pay | Admitting: Thoracic Surgery (Cardiothoracic Vascular Surgery)

## 2017-12-04 ENCOUNTER — Ambulatory Visit
Admission: RE | Admit: 2017-12-04 | Discharge: 2017-12-04 | Disposition: A | Discharge status: Home / Self Care | Payer: BLUE CROSS/BLUE SHIELD | Source: Ambulatory Visit | Attending: Thoracic Surgery (Cardiothoracic Vascular Surgery) | Admitting: Thoracic Surgery (Cardiothoracic Vascular Surgery)

## 2017-12-04 ENCOUNTER — Ambulatory Visit (INDEPENDENT_AMBULATORY_CARE_PROVIDER_SITE_OTHER): Payer: Self-pay | Admitting: Thoracic Surgery (Cardiothoracic Vascular Surgery)

## 2017-12-04 VITALS — BP 122/77 | HR 88 | Resp 18 | Ht 63.0 in | Wt 154.0 lb

## 2017-12-04 DIAGNOSIS — R0602 Shortness of breath: Secondary | ICD-10-CM | POA: Diagnosis not present

## 2017-12-04 DIAGNOSIS — J948 Other specified pleural conditions: Secondary | ICD-10-CM

## 2017-12-04 DIAGNOSIS — J9859 Other diseases of mediastinum, not elsewhere classified: Secondary | ICD-10-CM

## 2017-12-04 NOTE — Progress Notes (Signed)
[address]       [place] [address]             [phone]    HPI: Mrs. Montalban returns for a scheduled follow-up visit  She is a 41 year old woman who presented with shortness of breath and fatigue.  She was found to have a prominent heart murmur.  She was found to have a central soft tissue mass with narrowing of the main pulmonary artery and aortopulmonary window lymphadenopathy.  I did a Chamberlain on 11/16/2017.  She presented back with a moderate left pleural effusion.  A thoracentesis was done and the post procedure film was okay.  However she presented back on 11/24/2017 with a hydropneumothorax.  She was stable clinically so we followed that.  Repeat film on 12/14 showed decrease in the size of the pneumothorax component.  She went to [facility] yesterday and saw a rheumatology.  She is feeling better.  She is improving in terms of exercise tolerance.  She is still having some pain but is only taking morphine once a day at this point.  Past Medical History:  Diagnosis Date  . Endometriosis 2007,2009  . Mediastinal adenopathy   . Mononucleosis   . Seasonal allergies   . Strep throat   . Viral meningitis     Current Outpatient Medications  Medication Sig Dispense Refill  . acetaminophen (TYLENOL) 500 MG tablet Take 1,000 mg by mouth every 6 (six) hours as needed.    Marland Kitchen aspirin EC 81 MG tablet Take 81 mg by mouth daily.    . cetirizine (ZYRTEC) 10 MG tablet Take 10 mg by mouth daily.    . Cyanocobalamin (VITAMIN B 12 PO) Take 3,000 mcg by mouth daily.     Marland Kitchen ibuprofen (ADVIL,MOTRIN) 200 MG tablet Take 600 mg by mouth daily as needed for moderate pain.    . montelukast (SINGULAIR) 10 MG tablet Take 10 mg daily by mouth.  2  . morphine (MSIR) 15 MG tablet Take 1 tablet (15 mg total) by mouth every 4 (four) hours as needed for severe pain. 25 tablet 0  . Multiple Vitamins-Minerals (MULTIVITAMIN GUMMIES WOMENS) CHEW Chew 2 tablets by mouth daily.     . predniSONE  (DELTASONE) 10 MG tablet Take 3.5 tablets (35 mg total) by mouth 2 (two) times daily with a meal. (Patient taking differently: Take 30 mg by mouth 2 (two) times daily with a meal. ) 210 tablet 0  . Sodium Chloride-Sodium Bicarb (NETI POT SINUS WASH NA) Place 1 Dose into the nose daily.    Marland Kitchen omeprazole (PRILOSEC) 40 MG capsule   10   No current facility-administered medications for this visit.     Physical Exam BP 122/77   Pulse 88   Resp 18   Ht 5\' 3"  (1.6 m)   Wt 154 lb (69.9 kg)   LMP 11/12/2017 (Exact Date) Comment: pt shielded  SpO2 98% Comment: on RA  BMI 27.92 kg/m  41 year old woman in no acute distress Alert and oriented x3 with no focal deficits Murmur present Diminished breath sounds on left side Incision healing well  Diagnostic Tests: CHEST  2 VIEW  COMPARISON:  11/27/2017.  FINDINGS: Mediastinum and hilar structures normal. Heart size normal. Hydropneumothorax on the left is again noted. Pneumothorax has moderately increased in size from prior exam. Persistent elevation left hemidiaphragm. Degenerative change thoracic spine.  IMPRESSION: Hydropneumothorax on the left is again noted. Pneumothorax has increased moderately in size on today's  exam.  These results will be called to the ordering clinician or representative by the Radiologist Assistant, and communication documented in the PACS or zVision Dashboard.   Electronically Signed   By: Marcello Moores  Register   On: 12/04/2017 15:24 I personally reviewed the chest x-ray images and concur with the findings noted above.  Impression: Mrs. Ailes is a 41 year old woman with large vessel vasculitis with constriction of both main pulmonary arteries.  She had a Chamberlain procedure for diagnostic purposes.  Postoperatively she has had an elevated left hemidiaphragm.  She also had a pleural effusion.  After thoracentesis she developed a delayed pneumothorax.  She now has a hydropneumothorax.  Today the pneumo  component slightly larger but there is less fluid.  I am not sure what to make of that but she is stable and actually improving clinically so I do not think there is any intervention needed at this time.  I will plan to see her back in 2 weeks with a repeat chest x-ray.    [name], MD [facility] [phone]

## 2017-12-09 ENCOUNTER — Telehealth: Payer: Self-pay

## 2017-12-09 NOTE — Telephone Encounter (Signed)
Kara Anderson called this morning asking for a refill of her pain medication. Morphine 15mg  IR.  She stated that she currently takes it only when needed at night to help with sleep and pain.  She explained that she originally had the prescription filled by her oncologist and then refill from Dr. Roxan Hockey.  I told her best thing to do is to call her oncologist who originally prescribed the medication, and she said "she was okay with not taking it", she just wanted a few in case she needed it at night.  I told her that if she had any further questions to not hesitate to call.

## 2017-12-10 ENCOUNTER — Ambulatory Visit
Admission: RE | Admit: 2017-12-10 | Discharge: 2017-12-10 | Disposition: A | Discharge status: Home / Self Care | Payer: BLUE CROSS/BLUE SHIELD | Source: Ambulatory Visit | Attending: Cardiothoracic Surgery | Admitting: Cardiothoracic Surgery

## 2017-12-10 ENCOUNTER — Telehealth: Payer: Self-pay

## 2017-12-10 ENCOUNTER — Other Ambulatory Visit: Payer: Self-pay

## 2017-12-10 DIAGNOSIS — R0602 Shortness of breath: Secondary | ICD-10-CM | POA: Diagnosis not present

## 2017-12-10 NOTE — Telephone Encounter (Signed)
Kara Anderson came by the office and had a chest xray, Dr. Servando Snare looked at results from xray which improvement from last one done on 12/04/2017.  She was advised to call her oncologist to see if maybe her shortness of breath may have something to do with her medications being changed recently.  Patient acknowledged receipt.  She was also advised to call if she had any other questions or concerns and that we would see her at her follow-up appointment December 22, 2016.

## 2017-12-10 NOTE — Telephone Encounter (Signed)
Ms. Palomarez called this morning concerned about shortness of breath and feeling of passing out [place].  She stated that her oncologist has increased to 70mg  for 24 hours then decreased to 60mg  prednisone.  Lastly, decreased to 50mg  to be taken all at once.  She stated that she may be having some anxiety which she has never experienced before.  To make sure she does not have a worsening hydropneumothorax seen on xray her last visit with Dr. Roxan Hockey, patient is to have a chest xray today.

## 2017-12-17 ENCOUNTER — Telehealth: Payer: Self-pay

## 2017-12-17 ENCOUNTER — Ambulatory Visit: Payer: BLUE CROSS/BLUE SHIELD | Admitting: Hematology and Oncology

## 2017-12-17 NOTE — Telephone Encounter (Signed)
A call was placed to patient to let her know that she can continue treatment with [facility] and does not need to keep her appointment with the [facility].  Dr. Lebron Conners does want to have patient let him know how she is doing and how she feels during her treatment with [facility]. Patient verbalized understanding and stated

## 2017-12-19 DIAGNOSIS — I776 Arteritis, unspecified: Secondary | ICD-10-CM | POA: Insufficient documentation

## 2017-12-19 NOTE — Progress Notes (Signed)
[facility] Follow-up Visit:  Assessment: Vasculitis [facility]) 42 y.o. female with initial presentational progressive dyspnea with exertion and no shortness of breath at rest. Found to have a vague mass surrounding and exerting severe compression on the pulmonary arteries especially on the right side with associated mediastinal lymphadenopathy. Differential diagnosis includes vasculitis, mediastinal lymphoma such as primary mediastinal or Hodgkin's disease, and more rare tumor types such as angiosarcoma of the pulmonary artery. PET/CT was not tremendously helpful distinguishing the possibility allergies of the mass as it was mild hyper metabolic and no additional hyper metabolic sites were identified to offer an easier biopsy site.  Patient underwent mediastinal biopsy and results are more consistent with vasculitis with reactive inflammatory changes detected in all the lymph nodes and negative flow cytometry.  Plan: -- Patient will be referred to [facility] rheumatology.  Additional lab work obtained as requested --Start prednisone therapy 1 mg/kg.  Daily dose will be split into morning and evening portions evenly.   Voice recognition software was used and creation of this note. Despite my best effort at editing the text, some misspelling/errors may have occurred.   Orders Placed This Encounter  Procedures  . DG Chest 2 View    Standing Status:   Future    Number of Occurrences:   1    Standing Expiration Date:   11/19/2018    Order Specific Question:   Reason for Exam (SYMPTOM  OR DIAGNOSIS REQUIRED)    Answer:   Recent mediastinal Bx -- increased WOB, chest pain and decreased breath sounds on the left base. Please eval effusion or PTX    Order Specific Question:   Is patient pregnant?    Answer:   No    Order Specific Question:   Preferred imaging location?    Answer:   [facility]    Order Specific Question:   Radiology Contrast Protocol - do NOT remove  file path    Answer:   [email or web address]  . ANCA Titers    Standing Status:   Future    Number of Occurrences:   1    Standing Expiration Date:   11/19/2018  . ANA, IFA (with reflex)    Standing Status:   Future    Number of Occurrences:   1    Standing Expiration Date:   11/19/2018  . Sedimentation rate    Standing Status:   Future    Number of Occurrences:   1    Standing Expiration Date:   11/19/2018  . C-reactive protein    Standing Status:   Future    Number of Occurrences:   1    Standing Expiration Date:   11/19/2018  . Hepatitis C antibody (reflex if positive)    Standing Status:   Future    Number of Occurrences:   1    Standing Expiration Date:   11/19/2018  . Hepatitis B core antibody, total    Standing Status:   Future    Number of Occurrences:   1    Standing Expiration Date:   11/19/2018  . Hepatitis B surface antigen    Standing Status:   Future    Number of Occurrences:   1    Standing Expiration Date:   11/19/2018  . Hepatitis B surface antibody    Standing Status:   Future    Number of Occurrences:   1    Standing Expiration Date:   11/19/2018  . QuantiFERON-TB Gold Plus  .  CBC with Differential    Standing Status:   Future    Number of Occurrences:   1    Standing Expiration Date:   11/19/2018  . Ambulatory referral to Rheumatology    Referral Priority:   Urgent    Referral Type:   Consultation    Referral Reason:   Specialty Services Required    Requested Specialty:   Rheumatology    Number of Visits Requested:   1  . Ambulatory referral to Rheumatology    Referral Priority:   Urgent    Referral Type:   Consultation    Referral Reason:   Specialty Services Required    Referral Location:   [facility]    Requested Specialty:   Rheumatology    Number of Visits Requested:   1    Cancer Staging No matching staging information was found for the patient.  All questions were answered.  . The  patient knows to call the clinic with any problems, questions or concerns.  This note was electronically signed.    History of Presenting Illness Kara Anderson is a 42 y.o. female followed in the [place] for Evaluation of mediastinal mass and lymphadenopathy. She was previously admitted for evaluation of progressive shortness of breath over the past two months including intermittent shortness of breath At the rest.Echocardiogram obtained on 10/23/17 was essentially normal, CTA of the chest obtained on 11/04/17 Demonstrated no evidence of pulmonary emboli, but showed severe narrowing of bilateral pulmonary arteries with associated thickening of the blood vessel walls as well as a soft tissue mass in the central mediastinum as well as associated lymphadenopathy in the pre-vascular station measuring up to 1.5 cm short axis. CT demonstrated no supraclavicular or excellent for than apathy, no skeletal lesions. Additional assessment with CT of the abdomen and pelvis demonstrated no evidence of retroperitoneal, pelvic, or inguinal lymphadenopathy and no skeletal lesions. MRI of the chestWas supportive of larger vessel vasculitis/arthritis with Perivascular soft tissue And lymphadenopathy attributed to the inflammatory process.  Prior to admission, patient reports weight loss, increasing fatigue, and decreased activity tolerance to defer some breath. She denies active chest pain, palpitations, or cough. Denies dysphasia, nausea, vomiting, abdominal pain, diarrhea, or constipation. No visual deficits, no new focal weakness or sensory deficits in the extremities or face. No dysuria, hematuria. No musculoskeletal complaints and no genitourinary complaints.  At the present time, patient is complaining of worsening dyspnea on exertion and increased generalized by the achiness.  Overall, no new symptoms with exception of constipation but was new.   Medical History: Past Medical History:  Diagnosis Date  .  Endometriosis 2007,2009  . Mediastinal adenopathy   . Mononucleosis   . Seasonal allergies   . Strep throat   . Viral meningitis     Surgical History: Past Surgical History:  Procedure Laterality Date  . HIP SURGERY  2013  . IR THORACENTESIS ASP PLEURAL SPACE W/IMG GUIDE  11/20/2017  . LAPAROSCOPIC ABDOMINAL EXPLORATION  2007,2009   endometriosis  . MEDIASTINOTOMY CHAMBERLAIN MCNEIL Left 11/16/2017   Procedure: LEFT ANTERIOR MEDIASTINOTOMY CHAMBERLAIN PROCEDURE;  Surgeon: [name], MD;  Location: [place];  Service: Thoracic;  Laterality: Left;  . NOSE SURGERY  2007    Family History: Family History  Problem Relation Age of Onset  . Heart disease Mother   . Stroke Mother   . Diabetes Mother   . Heart disease Father   . Alcohol abuse Father     Social History: Social  History   Socioeconomic History  . Marital status: Married    Spouse name: Not on file  . Number of children: Not on file  . Years of education: Not on file  . Highest education level: Not on file  Social Needs  . Financial resource strain: Not on file  . Food insecurity - worry: Not on file  . Food insecurity - inability: Not on file  . Transportation needs - medical: Not on file  . Transportation needs - non-medical: Not on file  Occupational History  . Not on file  Tobacco Use  . Smoking status: Never Smoker  . Smokeless tobacco: Never Used  Substance and Sexual Activity  . Alcohol use: No    Alcohol/week: 0.0 oz  . Drug use: No  . Sexual activity: Not on file  Other Topics Concern  . Not on file  Social History Narrative  . Not on file    Allergies: Allergies  Allergen Reactions  . Codeine Nausea Only    Medications:  Current Outpatient Medications  Medication Sig Dispense Refill  . aspirin EC 81 MG tablet Take 81 mg by mouth daily.    . cetirizine (ZYRTEC) 10 MG tablet Take 10 mg by mouth daily.    . Cyanocobalamin (VITAMIN B 12 PO) Take 3,000 mcg by mouth daily.      Marland Kitchen ibuprofen (ADVIL,MOTRIN) 200 MG tablet Take 600 mg by mouth daily as needed for moderate pain.    . montelukast (SINGULAIR) 10 MG tablet Take 10 mg daily by mouth.  2  . Multiple Vitamins-Minerals (MULTIVITAMIN GUMMIES WOMENS) CHEW Chew 2 tablets by mouth daily.     . Sodium Chloride-Sodium Bicarb (NETI POT SINUS WASH NA) Place 1 Dose into the nose daily.    Marland Kitchen acetaminophen (TYLENOL) 500 MG tablet Take 1,000 mg by mouth every 6 (six) hours as needed.    Marland Kitchen morphine (MSIR) 15 MG tablet Take 1 tablet (15 mg total) by mouth every 4 (four) hours as needed for severe pain. 25 tablet 0  . omeprazole (PRILOSEC) 40 MG capsule   10  . predniSONE (DELTASONE) 10 MG tablet Take 3.5 tablets (35 mg total) by mouth 2 (two) times daily with a meal. (Patient taking differently: Take 30 mg by mouth 2 (two) times daily with a meal. ) 210 tablet 0   No current facility-administered medications for this visit.     Review of Systems: Review of Systems  Constitutional: Positive for fatigue. Negative for appetite change, chills, diaphoresis, fever and unexpected weight change.  HENT:  Negative.   Eyes: Negative.   Respiratory: Positive for shortness of breath. Negative for chest tightness, cough, hemoptysis and wheezing.   Cardiovascular: Negative.   Gastrointestinal: Negative.   Endocrine: Negative.   Genitourinary: Negative.    Musculoskeletal: Positive for back pain and myalgias. Negative for gait problem.  Skin: Negative.   Neurological: Positive for dizziness and light-headedness. Negative for extremity weakness and gait problem.  Hematological: Negative.   Psychiatric/Behavioral: Negative.      PHYSICAL EXAMINATION Blood pressure (!) 120/59, pulse 79, temperature 98.1 F (36.7 C), temperature source Oral, resp. rate 18, height _0  (1.6 m), weight 154 lb 9.6 oz (70.1 kg), last menstrual period 11/12/2017, SpO2 98 %.  ECOG PERFORMANCE STATUS: 2 - Symptomatic, <50% confined to bed  Physical Exam   Constitutional: She is oriented to person, place, and time. She appears distressed.  HENT:  Head: Normocephalic and atraumatic.  Mouth/Throat: Oropharynx is clear and moist. No  oropharyngeal exudate.  Eyes: Conjunctivae and EOM are normal. Pupils are equal, round, and reactive to light. No scleral icterus.  Neck: Normal range of motion. Neck supple. No thyromegaly present.  Cardiovascular: Normal rate and regular rhythm.  Murmur heard. Core systolic murmur heard throughout the precordium  Pulmonary/Chest: Effort normal and breath sounds normal. No respiratory distress. She has no wheezes.  Abdominal: Soft. Bowel sounds are normal. She exhibits no distension. There is no tenderness. There is no rebound.  Musculoskeletal: She exhibits no edema.  Lymphadenopathy:    She has no cervical adenopathy.  Neurological: She is alert and oriented to person, place, and time. She has normal reflexes. No cranial nerve deficit.  Skin: She is not diaphoretic.     LABORATORY DATA: I have personally reviewed the data as listed: Appointment on 11/19/2017  Component Date Value Ref Range Status  . WBC 11/19/2017 10.1  3.9 - 10.3 10e3/uL Final  . NEUT# 11/19/2017 8.3* 1.5 - 6.5 10e3/uL Final  . HGB 11/19/2017 10.1* 11.6 - 15.9 g/dL Final  . HCT 11/19/2017 31.7* 34.8 - 46.6 % Final  . Platelets 11/19/2017 331  145 - 400 10e3/uL Final  . MCV 11/19/2017 86.4  79.5 - 101.0 fL Final  . MCH 11/19/2017 27.5  25.1 - 34.0 pg Final  . MCHC 11/19/2017 31.9  31.5 - 36.0 g/dL Final  . RBC 11/19/2017 3.67* 3.70 - 5.45 10e6/uL Final  . RDW 11/19/2017 14.6* 11.2 - 14.5 % Final  . lymph# 11/19/2017 1.2  0.9 - 3.3 10e3/uL Final  . MONO# 11/19/2017 0.5  0.1 - 0.9 10e3/uL Final  . Eosinophils Absolute 11/19/2017 0.1  0.0 - 0.5 10e3/uL Final  . Basophils Absolute 11/19/2017 0.0  0.0 - 0.1 10e3/uL Final  . NEUT% 11/19/2017 81.6* 38.4 - 76.8 % Final  . LYMPH% 11/19/2017 11.8* 14.0 - 49.7 % Final  . MONO% 11/19/2017 5.2   0.0 - 14.0 % Final  . EOS% 11/19/2017 1.2  0.0 - 7.0 % Final  . BASO% 11/19/2017 0.2  0.0 - 2.0 % Final  . Hep B Surface Ab, Qual 11/19/2017 Reactive   Final   Comment:                              Non Reactive: Inconsistent with immunity,                                            less than 10 mIU/mL                              Reactive:     Consistent with immunity,                                            greater than 9.9 mIU/mL   . HBsAg Screen 11/19/2017 Negative  Negative Final  . Hep B Core Ab, Tot 11/19/2017 Negative  Negative Final  . HCV Ab 11/19/2017 <0.1  0.0 - 0.9 s/co ratio Final  . Comment: 11/19/2017 Comment   Final   Comment: Non reactive HCV antibody screen is consistent with no HCV infection, unless recent infection is suspected or other evidence exists to  indicate HCV infection.   . CRP 11/19/2017 124.5* 0.0 - 4.9 mg/L Final  . Sedimentation Rate-Westergren 11/19/2017 49* 0 - 32 mm/hr Final  . ANA Titer 1 11/19/2017 Negative   Final   Comment:                                                     Negative   <1:80                                                     Borderline  1:80                                                     Positive   >1:80   . Cytoplasmic (C-ANCA) 11/19/2017 <1:20  Neg:<1:20 titer Final  . Perinuclear (P-ANCA) 11/19/2017 <1:20  Neg:<1:20 titer Final   Comment: The presence of positive fluorescence exhibiting P-ANCA or C-ANCA patterns alone is not specific for the diagnosis of Wegener's Granulomatosis (WG) or microscopic polyangiitis. Decisions about treatment should not be based solely on ANCA IFA results.  The International ANCA Group Consensus recommends follow up testing of positive sera with both PR-3 and MPO-ANCA enzyme immunoassays. As many as 5% serum samples are positive only by EIA. Ref. AM J Clin Pathol 1999;111:507-513.   Marland Kitchen Atypical pANCA 11/19/2017 <1:20  Neg:<1:20 titer Final   Comment: The atypical pANCA pattern has been  observed in a significant percentage of patients with ulcerative colitis, primary sclerosing cholangitis and autoimmune hepatitis.   Office Visit on 11/19/2017  Component Date Value Ref Range Status  . QuantiFERON Incubation 11/19/2017 Incubation performed.   Final  . QuantiFERON Criteria 11/19/2017 Comment   Final   Comment: The QuantiFERON-TB Gold Plus result is determined by subtracting the Nil value from either TB antigen (Ag) tube. The mitogen tube serves as a control for the test.   . QuantiFERON TB1 Ag Value 11/19/2017 0.07  IU/mL Final  . QuantiFERON TB2 Ag Value 11/19/2017 0.07  IU/mL Final  . QuantiFERON Nil Value 11/19/2017 0.06  IU/mL Final  . QuantiFERON Mitogen Value 11/19/2017 4.64  IU/mL Final  . QuantiFERON-TB Gold Plus 11/19/2017 Negative  Negative Final  Admission on 11/16/2017, Discharged on 11/16/2017  Component Date Value Ref Range Status  . Preg, Serum 11/16/2017 NEGATIVE  NEGATIVE Final   Comment:        THE SENSITIVITY OF THIS METHODOLOGY IS >10 mIU/mL.   . ABO/RH(D) 11/16/2017 O POS   Final  . Antibody Screen 11/16/2017 NEG   Final  . Sample Expiration 11/16/2017 11/19/2017   Final  . Unit Number 11/16/2017 [id number]   Final  . Blood Component Type 11/16/2017 RED CELLS,LR   Final  . Unit division 11/16/2017 00   Final  . Status of Unit 11/16/2017 REL FROM [facility]   Final  . Transfusion Status 11/16/2017 OK TO TRANSFUSE   Final  . Crossmatch Result 11/16/2017 Compatible   Final  . Unit Number 11/16/2017 [id number]   Final  . Blood Component Type 11/16/2017  RED CELLS,LR   Final  . Unit division 11/16/2017 00   Final  . Status of Unit 11/16/2017 REL FROM [facility]   Final  . Transfusion Status 11/16/2017 OK TO TRANSFUSE   Final  . Crossmatch Result 11/16/2017 Compatible   Final  . Unit Number 11/16/2017 [id number]   Final  . Blood Component Type 11/16/2017 RED CELLS,LR   Final  . Unit division 11/16/2017 00   Final  . Status of Unit 11/16/2017 REL  FROM [facility]   Final  . Transfusion Status 11/16/2017 OK TO TRANSFUSE   Final  . Crossmatch Result 11/16/2017 Compatible   Final  . Unit Number 11/16/2017 [id number]   Final  . Blood Component Type 11/16/2017 RBC LR PHER1   Final  . Unit division 11/16/2017 00   Final  . Status of Unit 11/16/2017 REL FROM [facility]   Final  . Transfusion Status 11/16/2017 OK TO TRANSFUSE   Final  . Crossmatch Result 11/16/2017 Compatible   Final  . aPTT 11/16/2017 34  24 - 36 seconds Final  . WBC 11/16/2017 7.3  4.0 - 10.5 K/uL Final  . RBC 11/16/2017 3.99  3.87 - 5.11 MIL/uL Final  . Hemoglobin 11/16/2017 10.8* 12.0 - 15.0 g/dL Final  . HCT 11/16/2017 33.6* 36.0 - 46.0 % Final  . MCV 11/16/2017 84.2  78.0 - 100.0 fL Final  . MCH 11/16/2017 27.1  26.0 - 34.0 pg Final  . MCHC 11/16/2017 32.1  30.0 - 36.0 g/dL Final  . RDW 11/16/2017 14.1  11.5 - 15.5 % Final  . Platelets 11/16/2017 295  150 - 400 K/uL Final  . Sodium 11/16/2017 135  135 - 145 mmol/L Final  . Potassium 11/16/2017 3.7  3.5 - 5.1 mmol/L Final  . Chloride 11/16/2017 102  101 - 111 mmol/L Final  . CO2 11/16/2017 27  22 - 32 mmol/L Final  . Glucose, Bld 11/16/2017 86  65 - 99 mg/dL Final  . BUN 11/16/2017 16  6 - 20 mg/dL Final  . Creatinine, Ser 11/16/2017 0.63  0.44 - 1.00 mg/dL Final  . Calcium 11/16/2017 8.8* 8.9 - 10.3 mg/dL Final  . Total Protein 11/16/2017 7.7  6.5 - 8.1 g/dL Final  . Albumin 11/16/2017 3.3* 3.5 - 5.0 g/dL Final  . AST 11/16/2017 16  15 - 41 U/L Final  . ALT 11/16/2017 10* 14 - 54 U/L Final  . Alkaline Phosphatase 11/16/2017 63  38 - 126 U/L Final  . Total Bilirubin 11/16/2017 0.6  0.3 - 1.2 mg/dL Final  . GFR calc non Af Amer 11/16/2017 >60  >60 mL/min Final  . GFR calc Af Amer 11/16/2017 >60  >60 mL/min Final   Comment: (NOTE) The eGFR has been calculated using the CKD EPI equation. This calculation has not been validated in all clinical situations. eGFR's persistently <60 mL/min signify possible Chronic  Kidney Disease.   . Anion gap 11/16/2017 6  5 - 15 Final  . Prothrombin Time 11/16/2017 15.9* 11.4 - 15.2 seconds Final  . INR 11/16/2017 1.28   Final  . MRSA, PCR 11/16/2017 NEGATIVE  NEGATIVE Final  . Staphylococcus aureus 11/16/2017 POSITIVE* NEGATIVE Final   Comment: (NOTE) The Xpert SA Assay (FDA approved for NASAL specimens in patients 26 years of age and older), is one component of a comprehensive surveillance program. It is not intended to diagnose infection nor to guide or monitor treatment.   . ABO/RH(D) 11/16/2017 O POS   Final  . Specimen Description 11/16/2017 TISSUE LYMPH  NODE   Final  . Special Requests 11/16/2017 MEDIASTINUM   Final  . Gram Stain 11/16/2017    Final                   Value:RARE WBC PRESENT, PREDOMINANTLY MONONUCLEAR NO ORGANISMS SEEN   . Culture 11/16/2017 No growth aerobically or anaerobically.   Final  . Report Status 11/16/2017 11/21/2017 FINAL   Final  . AFB Specimen Processing 11/16/2017 Comment   Final   Tissue Grinding and Digestion/Decontamination  . Acid Fast Smear 11/16/2017 Negative   Final   Comment: (NOTE) Performed At: [facility] [place], [place] [id number] Rush Farmer MD [id number]   . Source (AFB) 11/16/2017 TISSUE   Final   LYMPH NODE  . Order Confirmation 11/16/2017 ORDER PROCESSED BY BLOOD BANK   Final  . ISSUE DATE / TIME 11/16/2017 [id number]   Final  . Blood Product Unit Number 11/16/2017 [id number]   Final  . PRODUCT CODE 11/16/2017 [id number]   Final  . Unit Type and Rh 11/16/2017 5100   Final  . Blood Product Expiration Date 11/16/2017 [id number]   Final  . ISSUE DATE / TIME 11/16/2017 [id number]   Final  . Blood Product Unit Number 11/16/2017 [id number]   Final  . PRODUCT CODE 11/16/2017 [id number]   Final  . Unit Type and Rh 11/16/2017 5100   Final  . Blood Product Expiration Date 11/16/2017 [id number]   Final  . ISSUE DATE / TIME 11/16/2017 [id number]   Final  . Blood  Product Unit Number 11/16/2017 [id number]   Final  . PRODUCT CODE 11/16/2017 [id number]   Final  . Unit Type and Rh 11/16/2017 5100   Final  . Blood Product Expiration Date 11/16/2017 [id number]   Final  . ISSUE DATE / TIME 11/16/2017 [id number]   Final  . Blood Product Unit Number 11/16/2017 [id number]   Final  . PRODUCT CODE 11/16/2017 [id number]   Final  . Unit Type and Rh 11/16/2017 5100   Final  . Blood Product Expiration Date 11/16/2017 [id number]   Final       Ardath Sax, MD

## 2017-12-19 NOTE — Assessment & Plan Note (Signed)
42 y.o. female with initial presentational progressive dyspnea with exertion and no shortness of breath at rest. Found to have a vague mass surrounding and exerting severe compression on the pulmonary arteries especially on the right side with associated mediastinal lymphadenopathy. Differential diagnosis includes vasculitis, mediastinal lymphoma such as primary mediastinal or Hodgkin's disease, and more rare tumor types such as angiosarcoma of the pulmonary artery. PET/CT was not tremendously helpful distinguishing the possibility allergies of the mass as it was mild hyper metabolic and no additional hyper metabolic sites were identified to offer an easier biopsy site.  Patient underwent mediastinal biopsy and results are more consistent with vasculitis with reactive inflammatory changes detected in all the lymph nodes and negative flow cytometry.  Plan: -- Patient will be referred to [facility] rheumatology.  Additional lab work obtained as requested --Start prednisone therapy 1 mg/kg.  Daily dose will be split into morning and evening portions evenly.

## 2017-12-21 ENCOUNTER — Other Ambulatory Visit: Payer: Self-pay | Admitting: Thoracic Surgery (Cardiothoracic Vascular Surgery)

## 2017-12-21 DIAGNOSIS — J9859 Other diseases of mediastinum, not elsewhere classified: Secondary | ICD-10-CM

## 2017-12-22 ENCOUNTER — Other Ambulatory Visit: Payer: Self-pay

## 2017-12-22 ENCOUNTER — Encounter: Payer: Self-pay | Admitting: Thoracic Surgery (Cardiothoracic Vascular Surgery)

## 2017-12-22 ENCOUNTER — Ambulatory Visit
Admission: RE | Admit: 2017-12-22 | Discharge: 2017-12-22 | Disposition: A | Discharge status: Home / Self Care | Payer: BLUE CROSS/BLUE SHIELD | Source: Ambulatory Visit | Attending: Thoracic Surgery (Cardiothoracic Vascular Surgery) | Admitting: Thoracic Surgery (Cardiothoracic Vascular Surgery)

## 2017-12-22 ENCOUNTER — Ambulatory Visit (INDEPENDENT_AMBULATORY_CARE_PROVIDER_SITE_OTHER): Payer: Self-pay | Admitting: Thoracic Surgery (Cardiothoracic Vascular Surgery)

## 2017-12-22 VITALS — BP 129/84 | HR 105 | Resp 18 | Ht 63.0 in | Wt 154.8 lb

## 2017-12-22 DIAGNOSIS — I288 Other diseases of pulmonary vessels: Secondary | ICD-10-CM

## 2017-12-22 DIAGNOSIS — J90 Pleural effusion, not elsewhere classified: Secondary | ICD-10-CM | POA: Diagnosis not present

## 2017-12-22 DIAGNOSIS — J9859 Other diseases of mediastinum, not elsewhere classified: Secondary | ICD-10-CM

## 2017-12-22 NOTE — Progress Notes (Signed)
[address]       [place] [address]             [phone]      HPI: Mrs. Peaster returns for a scheduled follow-up visit  Mrs. Jeng is a 42 year old woman who originally presented with shortness of breath and fatigue.  She was found to have a prominent heart murmur.  CT of the chest showed a central soft tissue mass with narrowing of the main pulmonary arteries bilaterally and aortopulmonary window lymphadenopathy.  I did a Chamberlain procedure on 11/16/2017.  Biopsies showed lymphoid infiltrate.  She presented back with a moderate left pleural effusion.  I did a thoracentesis.  Her initial film was okay but she came back with a hydropneumothorax.  Repeat film on 11/27/2017 showed a decrease in size of the pneumothorax component.  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  She felt very faint and diaphoretic.  The shortness of breath persisted.  She came in had a chest x-ray which showed only a small residual pleural effusion.  Her prednisone dose had been decreased just prior to that occurring.  She has seen Dr. Barbee Shropshire at [facility].  She is currently on 60 mg of prednisone daily.  Her incisional pain has improved dramatically.  She still has shortness of breath with exertion and still fatigues easily.  She complains of a aching sensation along her right costal margin.  Past Medical History:  Diagnosis Date  . Endometriosis 2007,2009  . Mediastinal adenopathy   . Mononucleosis   . Seasonal allergies   . Strep throat   . Viral meningitis     Current Outpatient Medications  Medication Sig Dispense Refill  . acetaminophen (TYLENOL) 500 MG tablet Take 1,000 mg by mouth every 6 (six) hours as needed.    Marland Kitchen aspirin EC 81 MG tablet Take 81 mg by mouth daily.    . cetirizine (ZYRTEC) 10 MG tablet Take 10 mg by mouth daily.    . Cyanocobalamin (VITAMIN B 12 PO) Take 3,000 mcg by mouth daily.     Marland Kitchen ibuprofen (ADVIL,MOTRIN) 200 MG tablet Take  600 mg by mouth daily as needed for moderate pain.    . montelukast (SINGULAIR) 10 MG tablet Take 10 mg daily by mouth.  2  . morphine (MSIR) 15 MG tablet Take 1 tablet (15 mg total) by mouth every 4 (four) hours as needed for severe pain. 25 tablet 0  . Multiple Vitamins-Minerals (MULTIVITAMIN GUMMIES WOMENS) CHEW Chew 2 tablets by mouth daily.     Marland Kitchen omeprazole (PRILOSEC) 40 MG capsule   10  . Sodium Chloride-Sodium Bicarb (NETI POT SINUS WASH NA) Place 1 Dose into the nose daily.     No current facility-administered medications for this visit.     Physical Exam BP 129/84 (BP Location: Right Arm, Patient Position: Sitting, Cuff Size: Normal)   Pulse (!) 105   Resp 18   Ht 5\' 3"  (1.6 m)   Wt 154 lb 12.8 oz (70.2 kg)   SpO2 98% Comment: on RA  BMI 27.85 kg/m  42 year old woman in no acute distress Alert and oriented x3 with no focal deficits Slightly diminished breath sounds at left base otherwise clear Cardiac regular rate and rhythm with 3/6 systolic murmur heard throughout precordium Incision clean dry and intact Liver edge approximately 5 cm below costal margin.  Discomfort but no frank tenderness with palpation  Diagnostic Tests: CHEST  2 VIEW  COMPARISON:  12/10/2017.  FINDINGS: Mediastinum and hilar structures are normal in unchanged from prior exam. Previously identified left base atelectasis/infiltrate and left-sided pleural effusion has almost completely cleared. No pneumothorax. Stable elevation left hemidiaphragm. No acute bony abnormality.  IMPRESSION: Previously identified left base atelectasis/infiltrate and left pleural effusion has almost completely cleared. Stable elevation of the left hemidiaphragm.   Electronically Signed   By: Marcello Moores  Register   On: 12/22/2017 16:38  I personally reviewed the chest x-ray and concur with the findings noted above  Impression: Ms. 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  She currently is being treated by Dr. Barbee Shro[facility] at [facility].  She is currently on 60 mg of prednisone a day.  She still has a prominent murmur.  Her exercise tolerance has improved a little with treatment.  I do think the narrowing of the pulmonary arteries is the primary component of her shortness of breath.   Her pain is improved dramatically.  Her incision is healing well.  There are no restrictions on her from a surgical standpoint, but I did caution her to be very gradual about increasing her activities due to the increased strain on her right heart.  I think the discomfort she is experiencing in her right upper quadrant is due to her hepatic congestion.  She had a CT of the abdomen and pelvis back in November which showed a normal gallbladder with no evidence of gallstones.  Her discomfort is not related to food intake in any way.  Plan: Return in 2 months with PA and lateral chest x-ray  [name], MD [facility] [phone]

## 2017-12-30 ENCOUNTER — Emergency Department (HOSPITAL_COMMUNITY): Payer: BLUE CROSS/BLUE SHIELD

## 2017-12-30 ENCOUNTER — Encounter (HOSPITAL_COMMUNITY): Payer: Self-pay | Admitting: *Deleted

## 2017-12-30 ENCOUNTER — Other Ambulatory Visit: Payer: Self-pay

## 2017-12-30 ENCOUNTER — Emergency Department (HOSPITAL_COMMUNITY)
Admission: EM | Admit: 2017-12-30 | Discharge: 2017-12-30 | Disposition: A | Discharge status: Home / Self Care | Payer: BLUE CROSS/BLUE SHIELD | Attending: Emergency Medicine | Admitting: Emergency Medicine

## 2017-12-30 DIAGNOSIS — R0603 Acute respiratory distress: Secondary | ICD-10-CM | POA: Insufficient documentation

## 2017-12-30 DIAGNOSIS — R0602 Shortness of breath: Secondary | ICD-10-CM | POA: Diagnosis not present

## 2017-12-30 DIAGNOSIS — R072 Precordial pain: Secondary | ICD-10-CM | POA: Diagnosis not present

## 2017-12-30 DIAGNOSIS — R1013 Epigastric pain: Secondary | ICD-10-CM | POA: Insufficient documentation

## 2017-12-30 DIAGNOSIS — R079 Chest pain, unspecified: Secondary | ICD-10-CM | POA: Diagnosis not present

## 2017-12-30 DIAGNOSIS — J90 Pleural effusion, not elsewhere classified: Secondary | ICD-10-CM | POA: Diagnosis not present

## 2017-12-30 LAB — BASIC METABOLIC PANEL
Anion gap: 11 (ref 5–15)
BUN: 13 mg/dL (ref 6–20)
CALCIUM: 9 mg/dL (ref 8.9–10.3)
CO2: 24 mmol/L (ref 22–32)
CREATININE: 0.77 mg/dL (ref 0.44–1.00)
Chloride: 102 mmol/L (ref 101–111)
GFR calc Af Amer: >60 mL/min (ref >60)
GFR calc non Af Amer: >60 mL/min (ref >60)
Glucose, Bld: 125 mg/dL — ABNORMAL HIGH (ref 65–99)
Potassium: 4.2 mmol/L (ref 3.5–5.1)
SODIUM: 137 mmol/L (ref 135–145)

## 2017-12-30 LAB — I-STAT TROPONIN, ED: TROPONIN I, POC: 0 ng/mL (ref 0.00–0.08)

## 2017-12-30 LAB — I-STAT BETA HCG BLOOD, ED (MC, WL, AP ONLY): I-stat hCG, quantitative: <5 m[IU]/mL (ref <5)

## 2017-12-30 LAB — CBC
HCT: 39.6 % (ref 36.0–46.0)
Hemoglobin: 12.8 g/dL (ref 12.0–15.0)
MCH: 29 pg (ref 26.0–34.0)
MCHC: 32.3 g/dL (ref 30.0–36.0)
MCV: 89.6 fL (ref 78.0–100.0)
PLATELETS: 314 10*3/uL (ref 150–400)
RBC: 4.42 MIL/uL (ref 3.87–5.11)
RDW: 17.6 % — AB (ref 11.5–15.5)
WBC: 15.9 10*3/uL — ABNORMAL HIGH (ref 4.0–10.5)

## 2017-12-30 MED ORDER — DICYCLOMINE HCL 20 MG PO TABS: 20.0000 mg | ORAL_TABLET | Freq: Two times a day (BID) | ORAL | 0 refills | Status: DC

## 2017-12-30 MED ORDER — IOPAMIDOL (ISOVUE-300) INJECTION 61%
INTRAVENOUS | Status: AC
  Administered 2017-12-30: 75 mL
  Filled 2017-12-30: qty 75

## 2017-12-30 MED ORDER — SUCRALFATE 1 G PO TABS: 1.0000 g | ORAL_TABLET | Freq: Three times a day (TID) | ORAL | 0 refills | Status: DC

## 2017-12-30 MED ORDER — TRAMADOL HCL 50 MG PO TABS: 50.0000 mg | ORAL_TABLET | Freq: Four times a day (QID) | ORAL | 0 refills | Status: DC | PRN

## 2017-12-30 MED ORDER — ONDANSETRON 4 MG PO TBDP: 4.0000 mg | ORAL_TABLET | Freq: Three times a day (TID) | ORAL | 0 refills | Status: DC | PRN

## 2017-12-30 NOTE — ED Provider Notes (Signed)
Emergency Department Provider Note   I have reviewed the triage vital signs and the nursing notes.   HISTORY  Chief Complaint Chest Pain   HPI Kara Anderson is a 42 y.o. female with PMH of pulmonary artery stenosis and mediastinal adenopathy followed by Rheumatology at [facility] presents to the emergency department for evaluation of worsening chest pain and shortness of breath.  She is currently on 50 mg of prednisone daily. She is followed by Dr. Lawerance Cruel at [facility] and is scheduled to return for visit on 01/05/18. Patient denies any new symptoms in character just more persistent despite steroids. No fever/chills. No modifying or alleviating factors. Patient with some epigastric fullness which has also been typical of her symptoms recently.    Past Medical History:  Diagnosis Date  . Endometriosis 2007,2009  . Mediastinal adenopathy   . Mononucleosis   . Seasonal allergies   . Strep throat   . Viral meningitis     Patient Active Problem List   Diagnosis Date Noted  . Vasculitis ([place]) 12/19/2017  . Mediastinal mass 11/04/2017  . Pulmonary stenosis 11/04/2017  . Shortness of breath 10/31/2017  . Bruit 10/31/2017  . Murmur 10/31/2017  . Breast mass, right 10/29/2015    Past Surgical History:  Procedure Laterality Date  . HIP SURGERY  2013  . IR THORACENTESIS ASP PLEURAL SPACE W/IMG GUIDE  11/20/2017  . LAPAROSCOPIC ABDOMINAL EXPLORATION  2007,2009   endometriosis  . MEDIASTINOTOMY CHAMBERLAIN MCNEIL Left 11/16/2017   Procedure: LEFT ANTERIOR MEDIASTINOTOMY CHAMBERLAIN PROCEDURE;  Surgeon: [name], MD;  Location: [facility];  Service: Thoracic;  Laterality: Left;  . NOSE SURGERY  2007    Current Outpatient Rx  . Order #: [id number] Class: Historical Med  . Order #: [id number] Class: Historical Med  . Order #: [id number] Class: Historical Med  . Order #: [id number] Class: Historical Med  . Order #: [id number] Class: Historical Med  . Order #: [id number] Class: Historical Med    . Order #: [id number] Class: Historical Med  . Order #: [id number] Class: Historical Med  . Order #: [id number] Class: Historical Med  . Order #: [id number] Class: Historical Med  . Order #: [id number] Class: Historical Med  . Order #: [id number] Class: Historical Med  . Order #: [id number] Class: Print  . Order #: [id number] Class: Print  . Order #: [id number] Class: Print  . Order #: [id number] Class: Print  . Order #: [id number] Class: Print    Allergies Codeine  Family History  Problem Relation Age of Onset  . Heart disease Mother   . Stroke Mother   . Diabetes Mother   . Heart disease Father   . Alcohol abuse Father     Social History Social History   Tobacco Use  . Smoking status: Never Smoker  . Smokeless tobacco: Never Used  Substance Use Topics  . Alcohol use: No    Alcohol/week: 0.0 oz  . Drug use: No    Review of Systems  Constitutional: No fever/chills Eyes: No visual changes. ENT: No sore throat. Cardiovascular: Positive chest pain. Respiratory: Positive shortness of breath. Gastrointestinal: Positive epigastric abdominal pain.  No nausea, no vomiting.  No diarrhea.  No constipation. Genitourinary: Negative for dysuria. Musculoskeletal: Negative for back pain. Skin: Negative for rash. Neurological: Negative for headaches, focal weakness or numbness.  10-point ROS otherwise negative.  ____________________________________________   PHYSICAL EXAM:  VITAL SIGNS: ED Triage Vitals  Enc Vitals Group     BP 12/30/17 1704 119/84     Pulse Rate 12/30/17 1704 77     Resp 12/30/17 1710  15     Temp 12/30/17 1704 98.2 F (36.8 C)     SpO2 12/30/17 1704 98 %     Pain Score 12/30/17 1716 8   Constitutional: Alert and oriented. Well appearing and in no acute distress. Eyes: Conjunctivae are normal. Head: Atraumatic. Nose: No congestion/rhinnorhea. Mouth/Throat: Mucous membranes are moist.  Neck: No stridor.  Cardiovascular: Normal rate, regular rhythm. Good  peripheral circulation. Grossly normal heart sounds.   Respiratory: Normal respiratory effort.  No retractions. Lungs CTAB. Gastrointestinal: Soft and nontender. No distention.  Musculoskeletal: No lower extremity tenderness nor edema. No gross deformities of extremities. Neurologic:  Normal speech and language. No gross focal neurologic deficits are appreciated.  Skin:  Skin is warm, dry and intact. No rash noted.  ____________________________________________   LABS (all labs ordered are listed, but only abnormal results are displayed)  Labs Reviewed  BASIC METABOLIC PANEL - Abnormal; Notable for the following components:      Result Value   Glucose, Bld 125 (*)    All other components within normal limits  CBC - Abnormal; Notable for the following components:   WBC 15.9 (*)    RDW 17.6 (*)    All other components within normal limits  I-STAT TROPONIN, ED  I-STAT BETA HCG BLOOD, ED ([facility], [facility], [facility] ONLY)   ____________________________________________  EKG   EKG Interpretation  Date/Time:  Wednesday December 30 2017 16:58:52 EST Ventricular Rate:  78 PR Interval:  130 QRS Duration: 72 QT Interval:  346 QTC Calculation: 394 R Axis:   52 Text Interpretation:  Normal sinus rhythm Normal ECG No STEMI.  Confirmed by Nanda Quinton [phone]) on 12/31/2017 12:12:07 AM       ____________________________________________  RADIOLOGY  Dg Chest 2 View  Result Date: 12/30/2017 CLINICAL DATA:  Chest pain shortness of breath EXAM: CHEST  2 VIEW COMPARISON:  12/22/2017, 11/25/2017 FINDINGS: The heart size and mediastinal contours are within normal limits. Both lungs are clear. The visualized skeletal structures are unremarkable. Stable elevation of the left diaphragm IMPRESSION: No active cardiopulmonary disease. Electronically Signed   By: Donavan Foil M.D.   On: 12/30/2017 17:59   Ct Chest W Contrast  Result Date: 12/30/2017 CLINICAL DATA:  Shortness of breath EXAM: CT CHEST WITH CONTRAST  TECHNIQUE: Multidetector CT imaging of the chest was performed during intravenous contrast administration. CONTRAST:  42mL ISOVUE-300 IOPAMIDOL (ISOVUE-300) INJECTION 61% COMPARISON:  CT chest 11/04/2017, PET-CT 11/12/2017 FINDINGS: Cardiovascular: No significant vascular findings. Normal heart size. No pericardial effusion. Thoracic aorta is normal in caliber. Mild narrowing of the right main pulmonary artery. Moderate narrowing of the left main pulmonary artery. Wall thickening of main pulmonary artery. Mediastinum/Nodes: Persistent AP window adenopathy with the largest lymph node measuring 17 mm. Prevascular mediastinal soft tissue mass likely reflecting enlarged lymph nodes measuring up to 2.3 cm in short axis. Thyroid gland, trachea, and esophagus demonstrate no significant findings. Lungs/Pleura: Trace left pleural effusion. No focal consolidation. No pneumothorax. Upper Abdomen: No acute abnormality. Musculoskeletal: No chest wall abnormality. No acute or significant osseous findings. IMPRESSION: 1. No acute cardiopulmonary disease. 2. Small left pleural effusion. 3. Persistent mediastinal lymphadenopathy. The prevascular soft tissue is more prominent compared with the prior examination. The mediastinal soft tissue is similar in appearance to the prior exam. Electronically Signed   By: Kathreen Devoid   On: 12/30/2017 19:17    ____________________________________________   PROCEDURES  Procedure(s) performed:   Procedures  None  ____________________________________________   INITIAL IMPRESSION / ASSESSMENT AND PLAN /  ED COURSE  Pertinent labs & imaging results that were available during my care of the patient were reviewed by me and considered in my medical decision making (see chart for details).  Patient presents to the ED with continued chest pain, abdominal pain, and SOB typical of her ongoing rheumatologic issues. Possibly vasculitis. CTA from triage is negative. CXR and labs are  unremarkable. Normal exam. Spoke with [facility] who called Dr. Lawerance Cruel to discuss the case. No inpatient admission/high dose steroids at this time. Normal vitals. No alternate cause of pain. Plan to keep same steroid dose and call Rheumatology in the AM. Patient is comfortable with this plan. Will treat with meds at home for supportive care.   At this time, I do not feel there is any life-threatening condition present. I have reviewed and discussed all results (EKG, imaging, lab, urine as appropriate), exam findings with patient. I have reviewed nursing notes and appropriate previous records.  I feel the patient is safe to be discharged home without further emergent workup. Discussed usual and customary return precautions. Patient and family (if present) verbalize understanding and are comfortable with this plan.  Patient will follow-up with their primary care provider. If they do not have a primary care provider, information for follow-up has been provided to them. All questions have been answered.  ____________________________________________  FINAL CLINICAL IMPRESSION(S) / ED DIAGNOSES  Final diagnoses:  Precordial chest pain  Acute respiratory distress     MEDICATIONS GIVEN DURING THIS VISIT:  Medications  iopamidol (ISOVUE-300) 61 % injection (75 mLs  Contrast Given 12/30/17 1849)     NEW OUTPATIENT MEDICATIONS STARTED DURING THIS VISIT:  Discharge Medication List as of 12/30/2017 10:59 PM    START taking these medications   Details  dicyclomine (BENTYL) 20 MG tablet Take 1 tablet (20 mg total) by mouth 2 (two) times daily., Starting Wed 12/30/2017, Print    ondansetron (ZOFRAN ODT) 4 MG disintegrating tablet Take 1 tablet (4 mg total) by mouth every 8 (eight) hours as needed for nausea or vomiting., Starting Wed 12/30/2017, Print    sucralfate (CARAFATE) 1 g tablet Take 1 tablet (1 g total) by mouth 4 (four) times daily -  with meals and at bedtime for 10 days., Starting Wed  12/30/2017, Until Sat 01/09/2018, Print    traMADol (ULTRAM) 50 MG tablet Take 1 tablet (50 mg total) by mouth every 6 (six) hours as needed., Starting Wed 12/30/2017, Print        Note:  This document was prepared using Dragon voice recognition software and may include unintentional dictation errors.  Nanda Quinton, MD Emergency Medicine   Monti Villers, [facility], MD 12/31/17 [phone]

## 2017-12-30 NOTE — Discharge Instructions (Signed)
You were seen in the ED today with chest pain. I spoke with Dr. Lawerance Cruel at [facility] and he would like for you to call in the morning to make a plan. Stay on the same dose of steroid for now. Return to the ED with any new or worsening chest pain.

## 2017-12-30 NOTE — ED Provider Notes (Signed)
Pt d/w pt's cardiologist, Dr. Esmond Plants.  He is concerned that her mass is worsening and recommends a CT with contrast which I ordered.   Isla Pence, MD 12/30/17 1900

## 2017-12-30 NOTE — ED Triage Notes (Signed)
Is a pt at [facility], was diagnosed with mass on her heart approx 2 months ago. Had recent increase in cp and sob, was feeling lightheaded and now has right side abd pain.

## 2018-01-05 DIAGNOSIS — Z7952 Long term (current) use of systemic steroids: Secondary | ICD-10-CM | POA: Diagnosis not present

## 2018-01-05 DIAGNOSIS — M314 Aortic arch syndrome [Takayasu]: Secondary | ICD-10-CM | POA: Diagnosis not present

## 2018-01-05 DIAGNOSIS — R0609 Other forms of dyspnea: Secondary | ICD-10-CM | POA: Diagnosis not present

## 2018-01-05 DIAGNOSIS — R590 Localized enlarged lymph nodes: Secondary | ICD-10-CM | POA: Diagnosis not present

## 2018-01-21 ENCOUNTER — Other Ambulatory Visit: Payer: Self-pay | Admitting: Rheumatology

## 2018-01-21 DIAGNOSIS — Z7952 Long term (current) use of systemic steroids: Secondary | ICD-10-CM

## 2018-01-28 ENCOUNTER — Ambulatory Visit
Admission: RE | Admit: 2018-01-28 | Discharge: 2018-01-28 | Disposition: A | Discharge status: Home / Self Care | Payer: BLUE CROSS/BLUE SHIELD | Source: Ambulatory Visit | Attending: Rheumatology | Admitting: Rheumatology

## 2018-01-28 DIAGNOSIS — Z13820 Encounter for screening for osteoporosis: Secondary | ICD-10-CM | POA: Diagnosis not present

## 2018-01-28 DIAGNOSIS — Z7952 Long term (current) use of systemic steroids: Secondary | ICD-10-CM

## 2018-02-02 DIAGNOSIS — Z7952 Long term (current) use of systemic steroids: Secondary | ICD-10-CM | POA: Diagnosis not present

## 2018-02-02 DIAGNOSIS — Z79899 Other long term (current) drug therapy: Secondary | ICD-10-CM | POA: Diagnosis not present

## 2018-02-02 DIAGNOSIS — M314 Aortic arch syndrome [Takayasu]: Secondary | ICD-10-CM | POA: Diagnosis not present

## 2018-02-09 DIAGNOSIS — J209 Acute bronchitis, unspecified: Secondary | ICD-10-CM | POA: Diagnosis not present

## 2018-02-18 DIAGNOSIS — R6882 Decreased libido: Secondary | ICD-10-CM | POA: Diagnosis not present

## 2018-02-18 DIAGNOSIS — R5383 Other fatigue: Secondary | ICD-10-CM | POA: Diagnosis not present

## 2018-02-18 DIAGNOSIS — R739 Hyperglycemia, unspecified: Secondary | ICD-10-CM | POA: Diagnosis not present

## 2018-02-22 ENCOUNTER — Other Ambulatory Visit: Payer: Self-pay | Admitting: Thoracic Surgery (Cardiothoracic Vascular Surgery)

## 2018-02-22 DIAGNOSIS — J9859 Other diseases of mediastinum, not elsewhere classified: Secondary | ICD-10-CM

## 2018-02-23 ENCOUNTER — Other Ambulatory Visit: Payer: Self-pay

## 2018-02-23 ENCOUNTER — Encounter: Payer: Self-pay | Admitting: Thoracic Surgery (Cardiothoracic Vascular Surgery)

## 2018-02-23 ENCOUNTER — Ambulatory Visit
Admission: RE | Admit: 2018-02-23 | Discharge: 2018-02-23 | Disposition: A | Discharge status: Home / Self Care | Payer: BLUE CROSS/BLUE SHIELD | Source: Ambulatory Visit | Attending: Thoracic Surgery (Cardiothoracic Vascular Surgery) | Admitting: Thoracic Surgery (Cardiothoracic Vascular Surgery)

## 2018-02-23 ENCOUNTER — Ambulatory Visit (INDEPENDENT_AMBULATORY_CARE_PROVIDER_SITE_OTHER): Payer: Self-pay | Admitting: Thoracic Surgery (Cardiothoracic Vascular Surgery)

## 2018-02-23 VITALS — BP 115/72 | HR 79 | Resp 18 | Ht 63.0 in | Wt 168.0 lb

## 2018-02-23 DIAGNOSIS — R590 Localized enlarged lymph nodes: Secondary | ICD-10-CM | POA: Diagnosis not present

## 2018-02-23 DIAGNOSIS — I370 Nonrheumatic pulmonary valve stenosis: Secondary | ICD-10-CM

## 2018-02-23 DIAGNOSIS — J9859 Other diseases of mediastinum, not elsewhere classified: Secondary | ICD-10-CM

## 2018-02-23 DIAGNOSIS — I776 Arteritis, unspecified: Secondary | ICD-10-CM

## 2018-02-23 NOTE — Progress Notes (Signed)
[address]       [place] [address]             [phone]      HPI: Kara Anderson returns for a scheduled follow-up visit  Mrs. Kara Anderson is a 42 year old woman who was healthy until last fall when she presented with profound shortness of breath with exertion and fatigue.  She was found to have a prominent heart murmur.  A CT of the chest showed a central soft tissue mass with severe narrowing of the pulmonary arteries bilaterally and aortopulmonary window lymphadenopathy.  I did a Chamberlain procedure and biopsied the pulmonary artery and removed lymph nodes.  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  She went to see rheumatology at [facility] and was treated with prednisone.  She currently is being transitioned over to CellCept.  She did gain over 20 pounds with prednisone.  She had an elevated left hemidiaphragm postoperatively after lymph node biopsies.  She is feeling better.  She still gets short of breath with exertion.  Her exercise tolerance is not back to her baseline yet.  She had a CT in January when she was having a lot of shortness of breath which showed some improvement in the narrowing of her pulmonary arteries.  She occasionally notices some pain at her incision when she first wakes up in the mornings.   Past Medical History:  Diagnosis Date  . Endometriosis 2007,2009  . Mediastinal adenopathy   . Mononucleosis   . Seasonal allergies   . Strep throat   . Viral meningitis      Current Outpatient Medications  Medication Sig Dispense Refill  . aspirin EC 81 MG tablet Take 81 mg by mouth daily.    . Calcium-Phosphorus-Vitamin D (CALCIUM GUMMIES PO) Take 1 tablet by mouth daily.    . cetirizine (ZYRTEC) 10 MG tablet Take 10 mg by mouth daily.    . Cholecalciferol (VITAMIN D-3) 1000 units CAPS Take 1,000-2,000 Units by mouth daily.    Marland Kitchen ibuprofen (ADVIL,MOTRIN) 200 MG tablet Take 200-600 mg by mouth daily as needed for moderate pain.       . montelukast (SINGULAIR) 10 MG tablet Take 10 mg daily by mouth.  2  . Multiple Vitamins-Minerals (MULTIVITAMIN GUMMIES WOMENS) CHEW Chew 2 tablets by mouth daily.     Marland Kitchen omeprazole (PRILOSEC) 40 MG capsule Take 40 mg by mouth daily before breakfast.   10  . predniSONE (DELTASONE) 20 MG tablet Take 60 mg by mouth daily with lunch.  0  . Probiotic Product (PROBIOTIC-10) CHEW Chew 1 tablet by mouth daily.    . Sodium Chloride-Sodium Bicarb (NETI POT SINUS WASH NA) Place 1 Dose into the nose daily.    . vitamin B-12 (CYANOCOBALAMIN) 1000 MCG tablet Take 3,000 mcg by mouth daily.     No current facility-administered medications for this visit.     Physical Exam BP 115/72 (BP Location: Right Arm, Patient Position: Sitting, Cuff Size: Normal)   Pulse 79   Resp 18   Ht 5\' 3"  (1.6 m)   Wt 168 lb (76.2 kg)   SpO2 98% Comment: RA  BMI 29.41 kg/m  42 year old woman in no acute distress Alert and oriented x3 with no focal deficits Lungs clear with equal breath sounds bilaterally Cardiac with 2/6 systolic murmur  Diagnostic Tests: CHEST - 2 VIEW  COMPARISON:  Chest x-ray dated December 30, 2017.  FINDINGS: The heart size and mediastinal contours are  within normal limits. Both lungs are clear. Unchanged elevation of the left hemidiaphragm. The visualized skeletal structures are unremarkable.  IMPRESSION: No active cardiopulmonary disease.   Electronically Signed   By: Titus Dubin M.D.   On: 02/23/2018 14:55 I personally reviewed the chest x-ray images and concur with the findings noted above.  I do however think the left hemidiaphragm has a different appearance than on her previous x-ray.  Impression: Kara Anderson is a 42 year old woman who presented with profound shortness of breath and fatigue.  She was found to have a central mass with narrowing of her left and right main pulmonary arteries.  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  She has improved significantly with  treatment although she is not back to her preillness baseline.  I encouraged her to continue to slowly increase her activities.  She had a Chamberlain procedure.  That was comp gated by elevated left hemidiaphragm.  Although the left diaphragm is still slightly elevated has a different contour than it did on her previous films I think she is starting to get some function back in that.  This would be about the time that would be expected.  Her CT in January showed significant improvement over her previous CT scan although there was still some narrowing of her pulmonary arteries.  Her Takayasu's arteritis is being managed by Dr. Barbee Shropshire at [facility].  Plan:  I will be happy to see her back at any time the future if I can be of any further assistance with her care.  [name], MD [facility] [phone]

## 2018-03-15 DIAGNOSIS — R635 Abnormal weight gain: Secondary | ICD-10-CM | POA: Diagnosis not present

## 2018-03-15 DIAGNOSIS — R6882 Decreased libido: Secondary | ICD-10-CM | POA: Diagnosis not present

## 2018-03-15 DIAGNOSIS — R5383 Other fatigue: Secondary | ICD-10-CM | POA: Diagnosis not present

## 2018-03-17 DIAGNOSIS — Z7952 Long term (current) use of systemic steroids: Secondary | ICD-10-CM | POA: Diagnosis not present

## 2018-03-17 DIAGNOSIS — H538 Other visual disturbances: Secondary | ICD-10-CM | POA: Diagnosis not present

## 2018-03-17 DIAGNOSIS — M314 Aortic arch syndrome [Takayasu]: Secondary | ICD-10-CM | POA: Diagnosis not present

## 2018-03-17 DIAGNOSIS — Z79899 Other long term (current) drug therapy: Secondary | ICD-10-CM | POA: Diagnosis not present

## 2018-03-27 IMAGING — DX DG CHEST 2V
2 series · 2 of 2 positions shown · non-contrast
Comparison: 12/22/2017, 11/25/2017

CLINICAL DATA: Chest pain shortness of breath

EXAM:
CHEST  2 VIEW

[w chest pa]
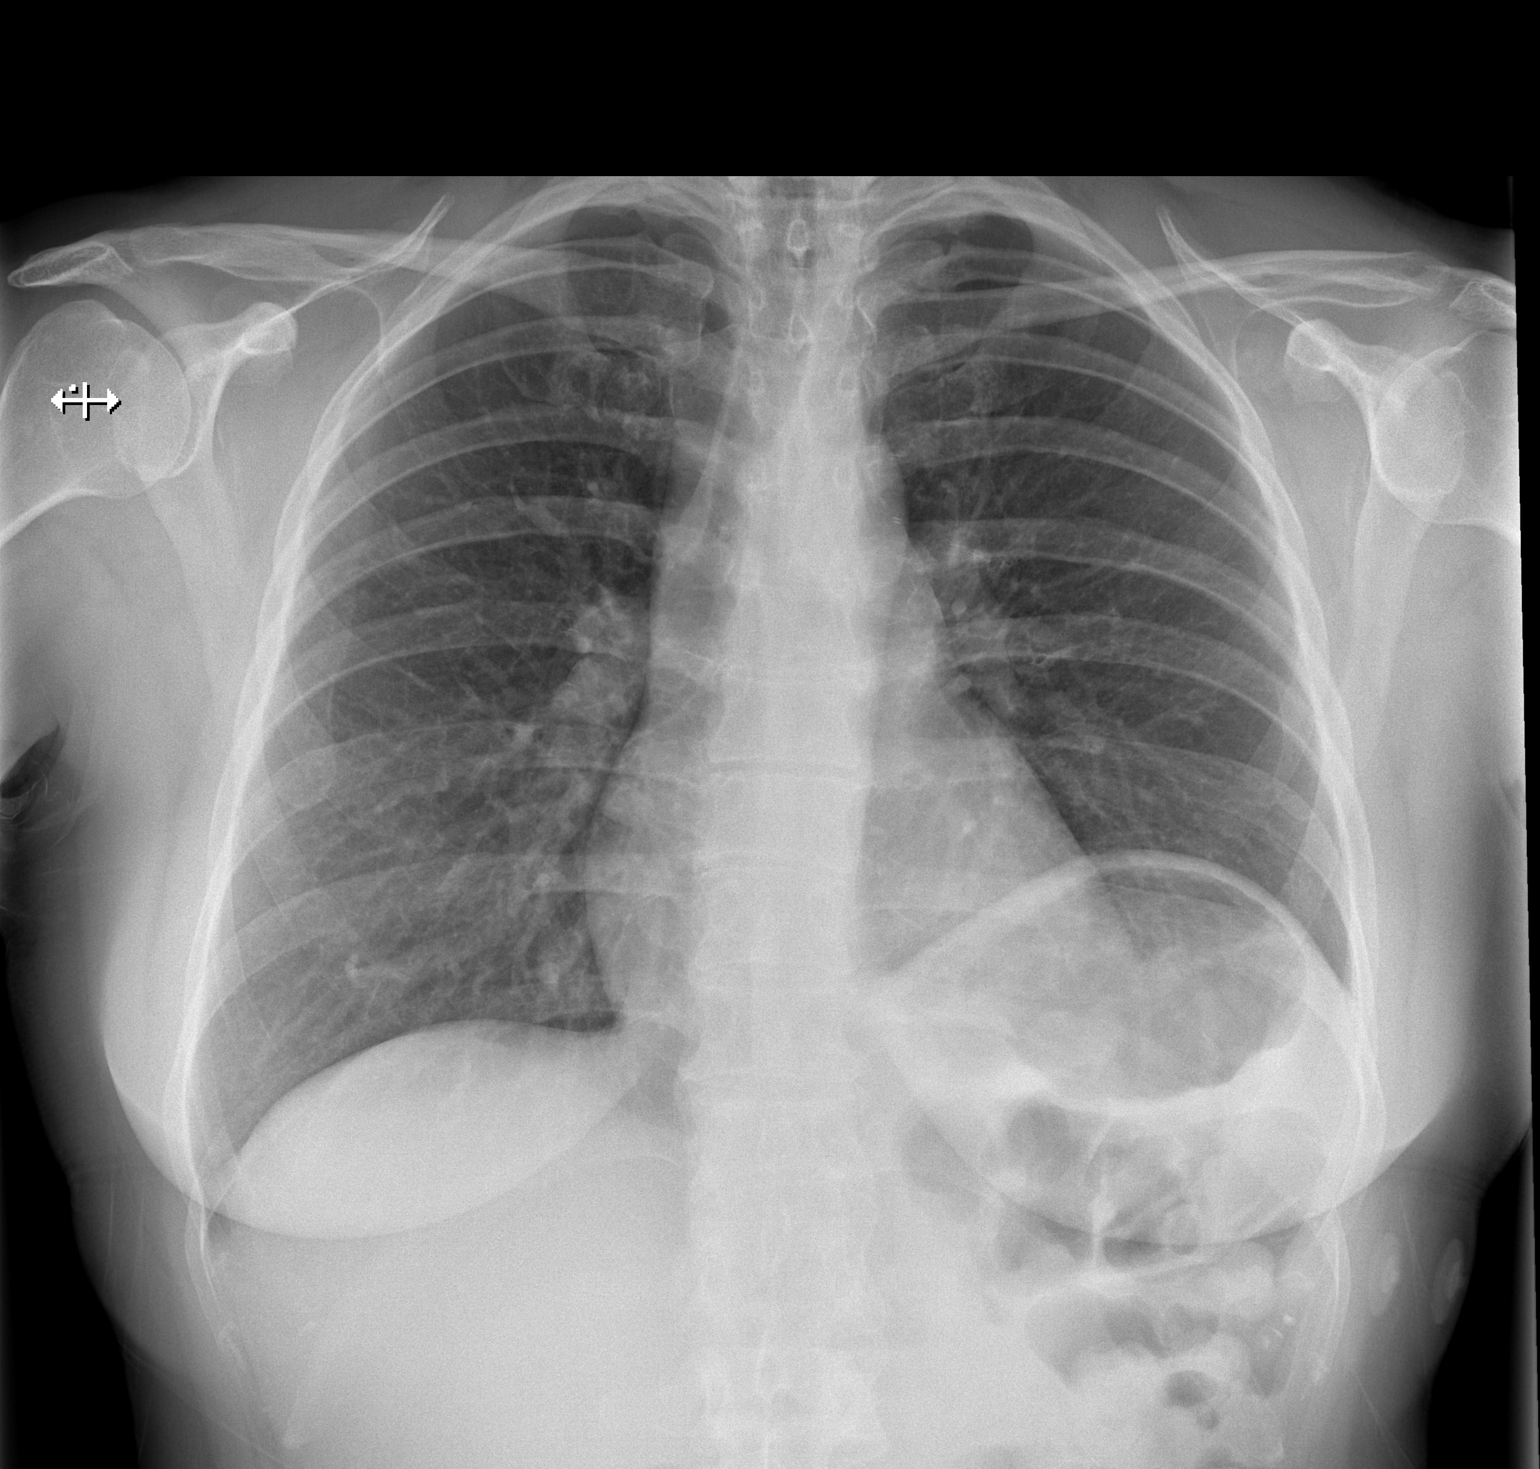

[w chest lat]
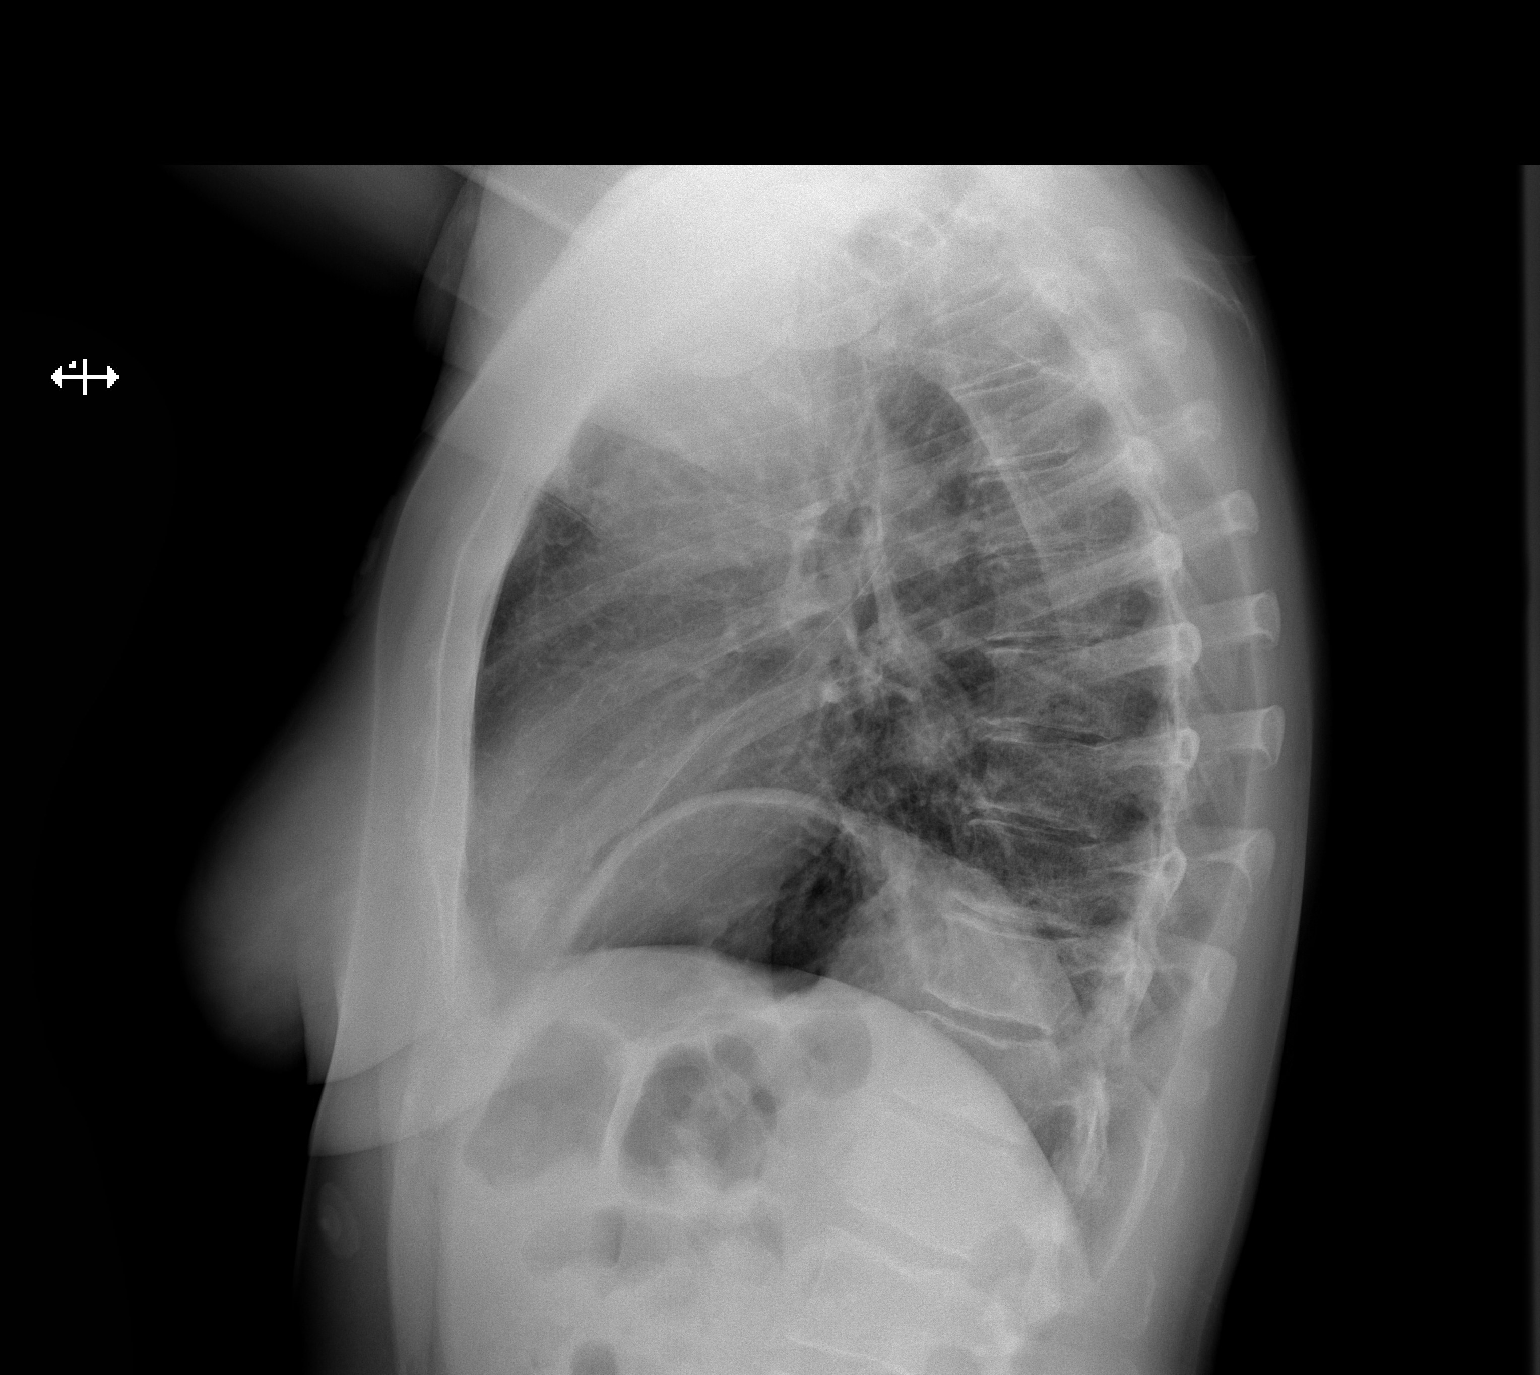

[2 of 2 positions shown; findings below may reference images not displayed]

FINDINGS: The heart size and mediastinal contours are within normal limits.
Both lungs are clear. The visualized skeletal structures are
unremarkable. Stable elevation of the left diaphragm
IMPRESSION: No active cardiopulmonary disease.

## 2018-03-29 DIAGNOSIS — M314 Aortic arch syndrome [Takayasu]: Secondary | ICD-10-CM | POA: Diagnosis not present

## 2018-04-21 DIAGNOSIS — R0600 Dyspnea, unspecified: Secondary | ICD-10-CM | POA: Diagnosis not present

## 2018-04-21 DIAGNOSIS — R5383 Other fatigue: Secondary | ICD-10-CM | POA: Diagnosis not present

## 2018-06-02 DIAGNOSIS — R0600 Dyspnea, unspecified: Secondary | ICD-10-CM | POA: Diagnosis not present

## 2018-06-21 DIAGNOSIS — M314 Aortic arch syndrome [Takayasu]: Secondary | ICD-10-CM | POA: Diagnosis not present

## 2018-06-21 DIAGNOSIS — Z79899 Other long term (current) drug therapy: Secondary | ICD-10-CM | POA: Diagnosis not present

## 2018-06-21 DIAGNOSIS — Z833 Family history of diabetes mellitus: Secondary | ICD-10-CM | POA: Diagnosis not present

## 2018-06-21 DIAGNOSIS — R0609 Other forms of dyspnea: Secondary | ICD-10-CM | POA: Diagnosis not present

## 2018-06-21 DIAGNOSIS — Z7982 Long term (current) use of aspirin: Secondary | ICD-10-CM | POA: Diagnosis not present

## 2018-06-22 ENCOUNTER — Other Ambulatory Visit: Payer: Self-pay | Admitting: Rheumatology

## 2018-06-22 DIAGNOSIS — M314 Aortic arch syndrome [Takayasu]: Secondary | ICD-10-CM

## 2018-06-23 DIAGNOSIS — R5383 Other fatigue: Secondary | ICD-10-CM | POA: Diagnosis not present

## 2018-06-23 DIAGNOSIS — R6882 Decreased libido: Secondary | ICD-10-CM | POA: Diagnosis not present

## 2018-06-27 ENCOUNTER — Other Ambulatory Visit: Payer: BLUE CROSS/BLUE SHIELD

## 2018-06-28 DIAGNOSIS — R918 Other nonspecific abnormal finding of lung field: Secondary | ICD-10-CM | POA: Diagnosis not present

## 2018-06-28 DIAGNOSIS — M314 Aortic arch syndrome [Takayasu]: Secondary | ICD-10-CM | POA: Diagnosis not present

## 2018-07-01 DIAGNOSIS — R6882 Decreased libido: Secondary | ICD-10-CM | POA: Diagnosis not present

## 2018-07-01 DIAGNOSIS — R5383 Other fatigue: Secondary | ICD-10-CM | POA: Diagnosis not present

## 2018-07-07 ENCOUNTER — Other Ambulatory Visit: Payer: Self-pay | Admitting: *Deleted

## 2018-07-07 DIAGNOSIS — R0602 Shortness of breath: Secondary | ICD-10-CM

## 2018-07-07 DIAGNOSIS — R0989 Other specified symptoms and signs involving the circulatory and respiratory systems: Secondary | ICD-10-CM

## 2018-07-07 DIAGNOSIS — R011 Cardiac murmur, unspecified: Secondary | ICD-10-CM

## 2018-07-13 DIAGNOSIS — M314 Aortic arch syndrome [Takayasu]: Secondary | ICD-10-CM | POA: Diagnosis not present

## 2018-07-13 DIAGNOSIS — I288 Other diseases of pulmonary vessels: Secondary | ICD-10-CM | POA: Diagnosis not present

## 2018-07-13 DIAGNOSIS — J9859 Other diseases of mediastinum, not elsewhere classified: Secondary | ICD-10-CM | POA: Diagnosis not present

## 2018-07-13 DIAGNOSIS — I371 Nonrheumatic pulmonary valve insufficiency: Secondary | ICD-10-CM | POA: Diagnosis not present

## 2018-07-13 DIAGNOSIS — R079 Chest pain, unspecified: Secondary | ICD-10-CM | POA: Diagnosis not present

## 2018-07-13 DIAGNOSIS — R0609 Other forms of dyspnea: Secondary | ICD-10-CM | POA: Diagnosis not present

## 2018-07-14 NOTE — Progress Notes (Signed)
Referral placed per verbal order by Dr. Rockey Situ. Patient to see Dr. Dionne Ano and already has scheduled appointment.

## 2018-08-03 DIAGNOSIS — Z79899 Other long term (current) drug therapy: Secondary | ICD-10-CM | POA: Diagnosis not present

## 2018-08-03 DIAGNOSIS — M314 Aortic arch syndrome [Takayasu]: Secondary | ICD-10-CM | POA: Diagnosis not present

## 2018-08-05 ENCOUNTER — Other Ambulatory Visit: Payer: Self-pay | Admitting: Rheumatology

## 2018-08-05 DIAGNOSIS — R0609 Other forms of dyspnea: Secondary | ICD-10-CM | POA: Diagnosis not present

## 2018-08-05 DIAGNOSIS — J9859 Other diseases of mediastinum, not elsewhere classified: Secondary | ICD-10-CM | POA: Diagnosis not present

## 2018-08-05 DIAGNOSIS — M314 Aortic arch syndrome [Takayasu]: Secondary | ICD-10-CM | POA: Diagnosis not present

## 2018-08-06 ENCOUNTER — Other Ambulatory Visit: Payer: Self-pay | Admitting: Rheumatology

## 2018-08-06 DIAGNOSIS — R948 Abnormal results of function studies of other organs and systems: Secondary | ICD-10-CM

## 2018-08-06 DIAGNOSIS — Z1231 Encounter for screening mammogram for malignant neoplasm of breast: Secondary | ICD-10-CM

## 2018-08-12 ENCOUNTER — Ambulatory Visit
Admission: RE | Admit: 2018-08-12 | Discharge: 2018-08-12 | Disposition: A | Discharge status: Home / Self Care | Payer: BLUE CROSS/BLUE SHIELD | Source: Ambulatory Visit | Attending: Rheumatology | Admitting: Rheumatology

## 2018-08-12 DIAGNOSIS — R922 Inconclusive mammogram: Secondary | ICD-10-CM | POA: Diagnosis not present

## 2018-08-12 DIAGNOSIS — N6489 Other specified disorders of breast: Secondary | ICD-10-CM | POA: Diagnosis not present

## 2018-08-12 DIAGNOSIS — R948 Abnormal results of function studies of other organs and systems: Secondary | ICD-10-CM

## 2018-08-20 DIAGNOSIS — M314 Aortic arch syndrome [Takayasu]: Secondary | ICD-10-CM | POA: Diagnosis not present

## 2018-08-20 DIAGNOSIS — Q256 Stenosis of pulmonary artery: Secondary | ICD-10-CM | POA: Diagnosis not present

## 2018-08-20 DIAGNOSIS — J9859 Other diseases of mediastinum, not elsewhere classified: Secondary | ICD-10-CM | POA: Diagnosis not present

## 2018-08-24 DIAGNOSIS — Q256 Stenosis of pulmonary artery: Secondary | ICD-10-CM | POA: Diagnosis not present

## 2018-09-09 DIAGNOSIS — R55 Syncope and collapse: Secondary | ICD-10-CM | POA: Diagnosis not present

## 2018-09-09 DIAGNOSIS — Z7982 Long term (current) use of aspirin: Secondary | ICD-10-CM | POA: Diagnosis not present

## 2018-09-09 DIAGNOSIS — M314 Aortic arch syndrome [Takayasu]: Secondary | ICD-10-CM | POA: Diagnosis not present

## 2018-09-09 DIAGNOSIS — Z87898 Personal history of other specified conditions: Secondary | ICD-10-CM | POA: Diagnosis not present

## 2018-09-09 DIAGNOSIS — Q256 Stenosis of pulmonary artery: Secondary | ICD-10-CM | POA: Insufficient documentation

## 2018-09-09 DIAGNOSIS — R002 Palpitations: Secondary | ICD-10-CM | POA: Diagnosis not present

## 2018-09-09 DIAGNOSIS — I372 Nonrheumatic pulmonary valve stenosis with insufficiency: Secondary | ICD-10-CM | POA: Diagnosis not present

## 2018-09-09 DIAGNOSIS — R0609 Other forms of dyspnea: Secondary | ICD-10-CM | POA: Diagnosis not present

## 2018-09-09 DIAGNOSIS — R0683 Snoring: Secondary | ICD-10-CM | POA: Diagnosis not present

## 2018-09-21 DIAGNOSIS — M314 Aortic arch syndrome [Takayasu]: Secondary | ICD-10-CM | POA: Diagnosis not present

## 2018-09-21 DIAGNOSIS — R0609 Other forms of dyspnea: Secondary | ICD-10-CM | POA: Diagnosis not present

## 2018-09-21 DIAGNOSIS — Z79899 Other long term (current) drug therapy: Secondary | ICD-10-CM | POA: Diagnosis not present

## 2018-09-29 DIAGNOSIS — R6882 Decreased libido: Secondary | ICD-10-CM | POA: Diagnosis not present

## 2018-09-29 DIAGNOSIS — R5383 Other fatigue: Secondary | ICD-10-CM | POA: Diagnosis not present

## 2018-09-29 DIAGNOSIS — Z23 Encounter for immunization: Secondary | ICD-10-CM | POA: Diagnosis not present

## 2018-09-29 DIAGNOSIS — R0981 Nasal congestion: Secondary | ICD-10-CM | POA: Diagnosis not present

## 2018-09-29 DIAGNOSIS — M314 Aortic arch syndrome [Takayasu]: Secondary | ICD-10-CM | POA: Diagnosis not present

## 2018-10-14 ENCOUNTER — Other Ambulatory Visit (HOSPITAL_COMMUNITY)
Admission: RE | Admit: 2018-10-14 | Discharge: 2018-10-14 | Disposition: A | Discharge status: Home / Self Care | Payer: BLUE CROSS/BLUE SHIELD | Source: Ambulatory Visit | Attending: Family Medicine | Admitting: Family Medicine

## 2018-10-14 ENCOUNTER — Encounter: Payer: Self-pay | Admitting: Family Medicine

## 2018-10-14 ENCOUNTER — Ambulatory Visit: Payer: BLUE CROSS/BLUE SHIELD | Admitting: Family Medicine

## 2018-10-14 VITALS — BP 95/56 | HR 86 | Ht 64.0 in | Wt 166.4 lb

## 2018-10-14 DIAGNOSIS — Z01419 Encounter for gynecological examination (general) (routine) without abnormal findings: Secondary | ICD-10-CM | POA: Diagnosis not present

## 2018-10-14 DIAGNOSIS — M314 Aortic arch syndrome [Takayasu]: Secondary | ICD-10-CM

## 2018-10-14 NOTE — Progress Notes (Signed)
    GYNECOLOGY ANNUAL VISIT ENCOUNTER NOTE  Subjective:   Kara Anderson is a 42 y.o. G0P0000 female here for a a problem GYN visit.  Current complaints: needing contraceptive plan per her rheumatologist and cardiologist. Patient has been with current partner for 10 yrs and they underwent fertility treatment/assessments. Partner has a low/zero sperm count.   Denies abnormal vaginal bleeding, discharge, pelvic pain, problems with intercourse or other gynecologic concerns.    Gynecologic History Patient's last menstrual period was 10/03/2018. Contraception: none Last Pap: 2016. Results were: normal Last mammogram: 2019. Results were: normal  The following portions of the patient's history were reviewed and updated as appropriate: allergies, current medications, past family history, past medical history, past social history, past surgical history and problem list.  Review of Systems Pertinent items are noted in HPI.   Objective:  BP (!) 95/56 (BP Location: Right Arm, Patient Position: Sitting, Cuff Size: Normal)   Pulse 86   Ht 5\' 4"  (1.626 m)   Wt 166 lb 6.4 oz (75.5 kg)   LMP 10/03/2018   BMI 28.56 kg/m  CONSTITUTIONAL: Well-developed, well-nourished female in no acute distress.  HENT:  Normocephalic, atraumatic, External right and left ear normal. Oropharynx is clear and moist EYES:  No scleral icterus.  NECK: Normal range of motion, supple, no masses.  Normal thyroid.  SKIN: Skin is warm and dry. No rash noted. Not diaphoretic. No erythema. No pallor. NEUROLOGIC: Alert and oriented to person, place, and time. Normal reflexes, muscle tone coordination. No cranial nerve deficit noted. PSYCHIATRIC: Normal mood and affect. Normal behavior. Normal judgment and thought content. CARDIOVASCULAR: Normal heart rate noted, regular rhythm. 2+ distal pulses. RESPIRATORY: Effort and breath sounds normal, no problems with respiration noted. BREASTS: Symmetric in size. No masses, skin changes,  nipple drainage, or lymphadenopathy. ABDOMEN: Soft,  no distention noted.  No tenderness, rebound or guarding.  PELVIC: Normal appearing external genitalia; normal appearing vaginal mucosa and cervix.  No abnormal discharge noted.  Pap smear obtained.  Normal uterine size, no other palpable masses, no uterine or adnexal tenderness. MUSCULOSKELETAL: Normal range of motion.   Assessment and Plan:  1. Well woman exam with routine gynecological exam - Updated history given recent diagnoses - Counseled on about contraceptive methods. Patient is low pregnancy risk given known issues with fertility and low/no sperm count in partner.  - patient understands risk of pregnancy and was counseled by specialists to not achieve pregnancy which I agree with. I do feel that on review of her reproductive history she is low risk for pregnancy.  We reivewed past methods and side effects from prior methods. Patient was most comfortable with micronor but does not want to start current.  - Recommended patient's partner had repeat sperm count and provide these results to specialists.  - if patient calls and wants to start contraceptive method we discussed Micronor being a good option and this can ben called in for the patient.   - Cytology - PAP   Please refer to After Visit Summary for other counseling recommendations.   Return in about 1 year (around 10/15/2019) for Yearly wellness exam.  Caren Macadam, MD, MPH, ABFM Attending [place] for [facility]

## 2018-10-14 NOTE — Patient Instructions (Signed)
Preventive Care 42-42 Years, Female Preventive care refers to lifestyle choices and visits with your health care provider that can promote health and wellness. What does preventive care include?  A yearly physical exam. This is also called an annual well check.  Dental exams once or twice a year.  Routine eye exams. Ask your health care provider how often you should have your eyes checked.  Personal lifestyle choices, including: ? Daily care of your teeth and gums. ? Regular physical activity. ? Eating a healthy diet. ? Avoiding tobacco and drug use. ? Limiting alcohol use. ? Practicing safe sex. ? Taking low-dose aspirin daily starting at age 42. ? Taking vitamin and mineral supplements as recommended by your health care provider. What happens during an annual well check? The services and screenings done by your health care provider during your annual well check will depend on your age, overall health, lifestyle risk factors, and family history of disease. Counseling Your health care provider may ask you questions about your:  Alcohol use.  Tobacco use.  Drug use.  Emotional well-being.  Home and relationship well-being.  Sexual activity.  Eating habits.  Work and work Statistician.  Method of birth control.  Menstrual cycle.  Pregnancy history.  Screening You may have the following tests or measurements:  Height, weight, and BMI.  Blood pressure.  Lipid and cholesterol levels. These may be checked every 5 years, or more frequently if you are over 42 years old.  Skin check.  Lung cancer screening. You may have this screening every year starting at age 42 if you have a 30-pack-year history of smoking and currently smoke or have quit within the past 15 years.  Fecal occult blood test (FOBT) of the stool. You may have this test every year starting at age 42.  Flexible sigmoidoscopy or colonoscopy. You may have a sigmoidoscopy every 5 years or a colonoscopy  every 10 years starting at age 42.  Hepatitis C blood test.  Hepatitis B blood test.  Sexually transmitted disease (STD) testing.  Diabetes screening. This is done by checking your blood sugar (glucose) after you have not eaten for a while (fasting). You may have this done every 1-3 years.  Mammogram. This may be done every 1-2 years. Talk to your health care provider about when you should start having regular mammograms. This may depend on whether you have a family history of breast cancer.  BRCA-related cancer screening. This may be done if you have a family history of breast, ovarian, tubal, or peritoneal cancers.  Pelvic exam and Pap test. This may be done every 3 years starting at age 42. Starting at age 42, this may be done every 5 years if you have a Pap test in combination with an HPV test.  Bone density scan. This is done to screen for osteoporosis. You may have this scan if you are at high risk for osteoporosis.  Discuss your test results, treatment options, and if necessary, the need for more tests with your health care provider. Vaccines Your health care provider may recommend certain vaccines, such as:  Influenza vaccine. This is recommended every year.  Tetanus, diphtheria, and acellular pertussis (Tdap, Td) vaccine. You may need a Td booster every 10 years.  Varicella vaccine. You may need this if you have not been vaccinated.  Zoster vaccine. You may need this after age 42.  Measles, mumps, and rubella (MMR) vaccine. You may need at least one dose of MMR if you were born in  1957 or later. You may also need a second dose.  Pneumococcal 13-valent conjugate (PCV13) vaccine. You may need this if you have certain conditions and were not previously vaccinated.  Pneumococcal polysaccharide (PPSV23) vaccine. You may need one or two doses if you smoke cigarettes or if you have certain conditions.  Meningococcal vaccine. You may need this if you have certain  conditions.  Hepatitis A vaccine. You may need this if you have certain conditions or if you travel or work in places where you may be exposed to hepatitis A.  Hepatitis B vaccine. You may need this if you have certain conditions or if you travel or work in places where you may be exposed to hepatitis B.  Haemophilus influenzae type b (Hib) vaccine. You may need this if you have certain conditions.  Talk to your health care provider about which screenings and vaccines you need and how often you need them. This information is not intended to replace advice given to you by your health care provider. Make sure you discuss any questions you have with your health care provider. Document Released: 12/28/2015 Document Revised: 08/20/2016 Document Reviewed: 10/02/2015 Elsevier Interactive Patient Education  2018 Elsevier Inc.  

## 2018-10-15 HISTORY — PX: OTHER SURGICAL HISTORY: SHX169

## 2018-10-18 LAB — CYTOLOGY - PAP
DIAGNOSIS: NEGATIVE
HPV: NOT DETECTED

## 2018-10-19 ENCOUNTER — Encounter: Payer: Self-pay | Admitting: *Deleted

## 2018-10-22 DIAGNOSIS — M314 Aortic arch syndrome [Takayasu]: Secondary | ICD-10-CM | POA: Diagnosis not present

## 2018-10-22 DIAGNOSIS — Q256 Stenosis of pulmonary artery: Secondary | ICD-10-CM | POA: Diagnosis not present

## 2018-10-22 DIAGNOSIS — E039 Hypothyroidism, unspecified: Secondary | ICD-10-CM | POA: Diagnosis not present

## 2018-10-22 DIAGNOSIS — Z79899 Other long term (current) drug therapy: Secondary | ICD-10-CM | POA: Diagnosis not present

## 2018-10-22 DIAGNOSIS — D649 Anemia, unspecified: Secondary | ICD-10-CM | POA: Diagnosis not present

## 2018-10-22 DIAGNOSIS — I9581 Postprocedural hypotension: Secondary | ICD-10-CM | POA: Insufficient documentation

## 2018-10-22 DIAGNOSIS — R0602 Shortness of breath: Secondary | ICD-10-CM | POA: Diagnosis not present

## 2018-10-22 DIAGNOSIS — Z7982 Long term (current) use of aspirin: Secondary | ICD-10-CM | POA: Diagnosis not present

## 2018-10-23 DIAGNOSIS — E039 Hypothyroidism, unspecified: Secondary | ICD-10-CM | POA: Diagnosis not present

## 2018-10-23 DIAGNOSIS — Z7982 Long term (current) use of aspirin: Secondary | ICD-10-CM | POA: Diagnosis not present

## 2018-10-23 DIAGNOSIS — D649 Anemia, unspecified: Secondary | ICD-10-CM | POA: Diagnosis not present

## 2018-10-23 DIAGNOSIS — Q256 Stenosis of pulmonary artery: Secondary | ICD-10-CM | POA: Diagnosis not present

## 2018-10-23 DIAGNOSIS — Z79899 Other long term (current) drug therapy: Secondary | ICD-10-CM | POA: Diagnosis not present

## 2018-10-23 DIAGNOSIS — M314 Aortic arch syndrome [Takayasu]: Secondary | ICD-10-CM | POA: Diagnosis not present

## 2018-10-23 DIAGNOSIS — I9581 Postprocedural hypotension: Secondary | ICD-10-CM | POA: Diagnosis not present

## 2018-10-28 NOTE — Addendum Note (Signed)
Addended by: Caryl Bis on: 10/28/2018 12:47 PM   Modules accepted: Level of Service

## 2018-11-30 DIAGNOSIS — R0789 Other chest pain: Secondary | ICD-10-CM | POA: Diagnosis not present

## 2018-12-07 DIAGNOSIS — I209 Angina pectoris, unspecified: Secondary | ICD-10-CM | POA: Diagnosis not present

## 2018-12-07 DIAGNOSIS — R079 Chest pain, unspecified: Secondary | ICD-10-CM | POA: Diagnosis not present

## 2018-12-07 DIAGNOSIS — R0789 Other chest pain: Secondary | ICD-10-CM | POA: Diagnosis not present

## 2018-12-10 DIAGNOSIS — I776 Arteritis, unspecified: Secondary | ICD-10-CM | POA: Diagnosis not present

## 2018-12-10 DIAGNOSIS — J019 Acute sinusitis, unspecified: Secondary | ICD-10-CM | POA: Diagnosis not present

## 2018-12-27 DIAGNOSIS — M314 Aortic arch syndrome [Takayasu]: Secondary | ICD-10-CM | POA: Diagnosis not present

## 2018-12-27 DIAGNOSIS — Z79899 Other long term (current) drug therapy: Secondary | ICD-10-CM | POA: Diagnosis not present

## 2019-01-17 DIAGNOSIS — E785 Hyperlipidemia, unspecified: Secondary | ICD-10-CM | POA: Diagnosis not present

## 2019-01-17 DIAGNOSIS — R635 Abnormal weight gain: Secondary | ICD-10-CM | POA: Diagnosis not present

## 2019-01-17 DIAGNOSIS — E559 Vitamin D deficiency, unspecified: Secondary | ICD-10-CM | POA: Diagnosis not present

## 2019-01-17 DIAGNOSIS — R0600 Dyspnea, unspecified: Secondary | ICD-10-CM | POA: Diagnosis not present

## 2019-01-17 DIAGNOSIS — R6882 Decreased libido: Secondary | ICD-10-CM | POA: Diagnosis not present

## 2019-01-17 DIAGNOSIS — R739 Hyperglycemia, unspecified: Secondary | ICD-10-CM | POA: Diagnosis not present

## 2019-01-17 DIAGNOSIS — R5383 Other fatigue: Secondary | ICD-10-CM | POA: Diagnosis not present

## 2019-02-04 DIAGNOSIS — R0600 Dyspnea, unspecified: Secondary | ICD-10-CM | POA: Diagnosis not present

## 2019-02-04 DIAGNOSIS — R0609 Other forms of dyspnea: Secondary | ICD-10-CM | POA: Diagnosis not present

## 2019-02-23 DIAGNOSIS — M542 Cervicalgia: Secondary | ICD-10-CM | POA: Diagnosis not present

## 2019-02-23 DIAGNOSIS — M9901 Segmental and somatic dysfunction of cervical region: Secondary | ICD-10-CM | POA: Diagnosis not present

## 2019-02-23 DIAGNOSIS — M546 Pain in thoracic spine: Secondary | ICD-10-CM | POA: Diagnosis not present

## 2019-02-23 DIAGNOSIS — M9902 Segmental and somatic dysfunction of thoracic region: Secondary | ICD-10-CM | POA: Diagnosis not present

## 2019-02-25 DIAGNOSIS — M546 Pain in thoracic spine: Secondary | ICD-10-CM | POA: Diagnosis not present

## 2019-02-25 DIAGNOSIS — M542 Cervicalgia: Secondary | ICD-10-CM | POA: Diagnosis not present

## 2019-02-25 DIAGNOSIS — M9901 Segmental and somatic dysfunction of cervical region: Secondary | ICD-10-CM | POA: Diagnosis not present

## 2019-02-25 DIAGNOSIS — M9902 Segmental and somatic dysfunction of thoracic region: Secondary | ICD-10-CM | POA: Diagnosis not present

## 2019-02-28 DIAGNOSIS — M9901 Segmental and somatic dysfunction of cervical region: Secondary | ICD-10-CM | POA: Diagnosis not present

## 2019-02-28 DIAGNOSIS — M546 Pain in thoracic spine: Secondary | ICD-10-CM | POA: Diagnosis not present

## 2019-02-28 DIAGNOSIS — M9902 Segmental and somatic dysfunction of thoracic region: Secondary | ICD-10-CM | POA: Diagnosis not present

## 2019-02-28 DIAGNOSIS — M542 Cervicalgia: Secondary | ICD-10-CM | POA: Diagnosis not present

## 2019-03-01 DIAGNOSIS — R079 Chest pain, unspecified: Secondary | ICD-10-CM | POA: Diagnosis not present

## 2019-03-01 DIAGNOSIS — I471 Supraventricular tachycardia: Secondary | ICD-10-CM | POA: Diagnosis not present

## 2019-03-01 DIAGNOSIS — R0609 Other forms of dyspnea: Secondary | ICD-10-CM | POA: Diagnosis not present

## 2019-03-01 DIAGNOSIS — R0602 Shortness of breath: Secondary | ICD-10-CM | POA: Diagnosis not present

## 2019-03-02 DIAGNOSIS — M546 Pain in thoracic spine: Secondary | ICD-10-CM | POA: Diagnosis not present

## 2019-03-02 DIAGNOSIS — M9902 Segmental and somatic dysfunction of thoracic region: Secondary | ICD-10-CM | POA: Diagnosis not present

## 2019-03-02 DIAGNOSIS — M9901 Segmental and somatic dysfunction of cervical region: Secondary | ICD-10-CM | POA: Diagnosis not present

## 2019-03-02 DIAGNOSIS — M542 Cervicalgia: Secondary | ICD-10-CM | POA: Diagnosis not present

## 2019-03-03 DIAGNOSIS — Q256 Stenosis of pulmonary artery: Secondary | ICD-10-CM | POA: Diagnosis not present

## 2019-03-03 DIAGNOSIS — J9859 Other diseases of mediastinum, not elsewhere classified: Secondary | ICD-10-CM | POA: Diagnosis not present

## 2019-03-03 DIAGNOSIS — R0609 Other forms of dyspnea: Secondary | ICD-10-CM | POA: Diagnosis not present

## 2019-03-08 ENCOUNTER — Encounter (INDEPENDENT_AMBULATORY_CARE_PROVIDER_SITE_OTHER): Payer: Self-pay | Admitting: Internal Medicine

## 2019-03-08 DIAGNOSIS — J209 Acute bronchitis, unspecified: Secondary | ICD-10-CM | POA: Diagnosis not present

## 2019-03-09 DIAGNOSIS — I471 Supraventricular tachycardia: Secondary | ICD-10-CM | POA: Diagnosis not present

## 2019-03-23 DIAGNOSIS — R0609 Other forms of dyspnea: Secondary | ICD-10-CM | POA: Diagnosis not present

## 2019-03-23 DIAGNOSIS — M314 Aortic arch syndrome [Takayasu]: Secondary | ICD-10-CM | POA: Diagnosis not present

## 2019-03-23 DIAGNOSIS — Z79899 Other long term (current) drug therapy: Secondary | ICD-10-CM | POA: Diagnosis not present

## 2019-03-29 DIAGNOSIS — M314 Aortic arch syndrome [Takayasu]: Secondary | ICD-10-CM | POA: Diagnosis not present

## 2019-03-29 DIAGNOSIS — Z79899 Other long term (current) drug therapy: Secondary | ICD-10-CM | POA: Diagnosis not present

## 2019-04-20 ENCOUNTER — Ambulatory Visit (INDEPENDENT_AMBULATORY_CARE_PROVIDER_SITE_OTHER): Payer: BLUE CROSS/BLUE SHIELD | Admitting: Internal Medicine

## 2019-04-20 DIAGNOSIS — M9902 Segmental and somatic dysfunction of thoracic region: Secondary | ICD-10-CM | POA: Diagnosis not present

## 2019-04-20 DIAGNOSIS — R0981 Nasal congestion: Secondary | ICD-10-CM | POA: Diagnosis not present

## 2019-04-20 DIAGNOSIS — R5383 Other fatigue: Secondary | ICD-10-CM | POA: Diagnosis not present

## 2019-04-20 DIAGNOSIS — M9901 Segmental and somatic dysfunction of cervical region: Secondary | ICD-10-CM | POA: Diagnosis not present

## 2019-04-20 DIAGNOSIS — M546 Pain in thoracic spine: Secondary | ICD-10-CM | POA: Diagnosis not present

## 2019-04-20 DIAGNOSIS — M542 Cervicalgia: Secondary | ICD-10-CM | POA: Diagnosis not present

## 2019-04-20 DIAGNOSIS — E785 Hyperlipidemia, unspecified: Secondary | ICD-10-CM | POA: Diagnosis not present

## 2019-04-20 DIAGNOSIS — R6882 Decreased libido: Secondary | ICD-10-CM | POA: Diagnosis not present

## 2019-04-27 DIAGNOSIS — M9901 Segmental and somatic dysfunction of cervical region: Secondary | ICD-10-CM | POA: Diagnosis not present

## 2019-04-27 DIAGNOSIS — M546 Pain in thoracic spine: Secondary | ICD-10-CM | POA: Diagnosis not present

## 2019-04-27 DIAGNOSIS — M9902 Segmental and somatic dysfunction of thoracic region: Secondary | ICD-10-CM | POA: Diagnosis not present

## 2019-04-27 DIAGNOSIS — M542 Cervicalgia: Secondary | ICD-10-CM | POA: Diagnosis not present

## 2019-04-29 ENCOUNTER — Other Ambulatory Visit: Payer: BLUE CROSS/BLUE SHIELD

## 2019-04-29 ENCOUNTER — Other Ambulatory Visit: Payer: Self-pay

## 2019-04-29 ENCOUNTER — Ambulatory Visit (INDEPENDENT_AMBULATORY_CARE_PROVIDER_SITE_OTHER): Payer: BLUE CROSS/BLUE SHIELD

## 2019-04-29 ENCOUNTER — Ambulatory Visit: Payer: Self-pay | Admitting: *Deleted

## 2019-04-29 ENCOUNTER — Ambulatory Visit
Admission: EM | Admit: 2019-04-29 | Discharge: 2019-04-29 | Disposition: A | Discharge status: Home / Self Care | Payer: BLUE CROSS/BLUE SHIELD

## 2019-04-29 DIAGNOSIS — M791 Myalgia, unspecified site: Secondary | ICD-10-CM | POA: Diagnosis not present

## 2019-04-29 DIAGNOSIS — R42 Dizziness and giddiness: Secondary | ICD-10-CM | POA: Diagnosis not present

## 2019-04-29 DIAGNOSIS — R6889 Other general symptoms and signs: Secondary | ICD-10-CM | POA: Diagnosis not present

## 2019-04-29 DIAGNOSIS — Z20822 Contact with and (suspected) exposure to covid-19: Secondary | ICD-10-CM

## 2019-04-29 DIAGNOSIS — R0602 Shortness of breath: Secondary | ICD-10-CM

## 2019-04-29 DIAGNOSIS — B349 Viral infection, unspecified: Secondary | ICD-10-CM

## 2019-04-29 DIAGNOSIS — R51 Headache: Secondary | ICD-10-CM | POA: Diagnosis not present

## 2019-04-29 DIAGNOSIS — R05 Cough: Secondary | ICD-10-CM | POA: Diagnosis not present

## 2019-04-29 NOTE — Telephone Encounter (Signed)
PA called regarding covid testing for pt. She states she has had a cough and a few other symptoms that were ongoing and cornering to her. PA did not want to stay on the line. Please advise.   Symptoms: cough, SOB, worse this week, diarrhea Immune compromised

## 2019-04-29 NOTE — Discharge Instructions (Addendum)
Chest x-ray not significant for pneumonia Go to drive thru testing for COVID at [address], [place] at the [place] building across for the hospital We will follow up with you regarding abnormal results.    In the meantime: You should remain isolated in your home for 7 days from symptom onset AND greater than 72 hours after symptoms resolution (absence of fever without the use of fever-reducing medication and improvement in respiratory symptoms), whichever is longer Get plenty of rest and push fluids You may use OTC zyrtec and/or flonase as needed for congestion and/ or runny nose Take OTC tylenol as needed for fever, body aches, and/or chills Call or go to the ED if you have any new or worsening symptoms such as fever, worsening cough, shortness of breath, chest tightness, chest pain, turning blue, changes in mental status, etc..Marland Kitchen

## 2019-04-29 NOTE — ED Provider Notes (Signed)
[name]   [id number] 04/29/19 Arrival Time: 3149   CC: Multiple complaints  SUBJECTIVE: History from: patient.  Kara Anderson is a 43 y.o. 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  Denies sick exposure to COVID, flu or strep.  Denies recent travel.  Has tried OTC ibuprofen without relief.  Reports hx of chronic fatigue, mild cough, and SOB related to her immunocompromised state, but states symptoms are more "prominent" now.  Complains of associated congestion, rhinorrhea, nausea, and diffuse abdominal discomfort.  Denies fever, chills, sinus pain, sore throat, wheezing, chest pain, changes in bladder habits.    Last BM this am and normal for patient.    ROS: As per HPI.  Past Medical History:  Diagnosis Date  . Endometriosis 2007,2009  . Mediastinal adenopathy   . Mononucleosis   . Seasonal allergies   . Strep throat   . Viral meningitis    Past Surgical History:  Procedure Laterality Date  . HIP SURGERY  2013  . IR THORACENTESIS ASP PLEURAL SPACE W/IMG GUIDE  11/20/2017  . LAPAROSCOPIC ABDOMINAL EXPLORATION  2007,2009   endometriosis  . MEDIASTINOTOMY CHAMBERLAIN MCNEIL Left 11/16/2017   Procedure: LEFT ANTERIOR MEDIASTINOTOMY CHAMBERLAIN PROCEDURE;  Surgeon: [name], MD;  Location: [facility];  Service: Thoracic;  Laterality: Left;  . NOSE SURGERY  2007   Allergies  Allergen Reactions  . Codeine Nausea Only   No current facility-administered medications on file prior to encounter.    Current Outpatient Medications on File Prior to Encounter  Medication Sig Dispense Refill  . aspirin EC 81 MG tablet Take 81 mg by mouth daily.    . Calcium-Phosphorus-Vitamin D (CALCIUM GUMMIES PO) Take 1 tablet by mouth daily.    .  cetirizine (ZYRTEC) 10 MG tablet Take 10 mg by mouth daily.    . Cholecalciferol (VITAMIN D-3) 1000 units CAPS Take 1,000-2,000 Units by mouth daily.    Marland Kitchen loratadine (CLARITIN) 10 MG tablet Take 10 mg by mouth daily.    . montelukast (SINGULAIR) 10 MG tablet Take 10 mg daily by mouth.  2  . mycophenolate (CELLCEPT) 500 MG tablet Take 1,500 mg by mouth 2 (two) times a day.    . NP THYROID 90 MG tablet TK 1 T PO D  3  . Testosterone 25 MG/2.5GM (1%) GEL Apply topically.    . vitamin B-12 (CYANOCOBALAMIN) 1000 MCG tablet Take 3,000 mcg by mouth daily.    Marland Kitchen ibuprofen (ADVIL,MOTRIN) 200 MG tablet Take 200-600 mg by mouth daily as needed for moderate pain.     . Multiple Vitamins-Minerals (MULTIVITAMIN GUMMIES WOMENS) CHEW Chew 2 tablets by mouth daily.     . Probiotic Product (PROBIOTIC-10) CHEW Chew 1 tablet by mouth daily.    Marland Kitchen Resveratrol 250 MG CAPS Take 500 mg by mouth.     . Sodium Chloride-Sodium Bicarb (NETI POT SINUS WASH NA) Place 1 Dose into the nose daily.     Social History   Socioeconomic History  . Marital status: Married    Spouse name: Not on file  . Number of children: Not on file  . Years of education: Not on file  . Highest education level: Not on file  Occupational History  . Not on file  Social Needs  . Financial resource strain: Not on file  .  Food insecurity:    Worry: Not on file    Inability: Not on file  . Transportation needs:    Medical: Not on file    Non-medical: Not on file  Tobacco Use  . Smoking status: Never Smoker  . Smokeless tobacco: Never Used  Substance and Sexual Activity  . Alcohol use: No    Alcohol/week: 0.0 standard drinks  . Drug use: No  . Sexual activity: Yes    Birth control/protection: None  Lifestyle  . Physical activity:    Days per week: Not on file    Minutes per session: Not on file  . Stress: Not on file  Relationships  . Social connections:    Talks on phone: Not on file    Gets together: Not on file    Attends  religious service: Not on file    Active member of club or organization: Not on file    Attends meetings of clubs or organizations: Not on file    Relationship status: Not on file  . Intimate partner violence:    Fear of current or ex partner: Not on file    Emotionally abused: Not on file    Physically abused: Not on file    Forced sexual activity: Not on file  Other Topics Concern  . Not on file  Social History Narrative  . Not on file   Family History  Problem Relation Age of Onset  . Heart disease Mother   . Stroke Mother   . Diabetes Mother   . Heart disease Father   . Alcohol abuse Father     OBJECTIVE:  Vitals:   04/29/19 1451  BP: 122/80  Pulse: 80  Resp: 18  Temp: 98 F (36.7 C)  SpO2: 97%     General appearance: alert; well-appearing, nontoxic; speaking in full sentences and tolerating own secretions HEENT: NCAT; Ears: EACs clear, TMs pearly gray; Eyes: PERRL.  EOM grossly intact. Sinuses: nontender; Nose: nares patent without rhinorrhea, Throat: oropharynx clear, tonsils non erythematous or enlarged, uvula midline  Neck: supple without LAD Lungs: unlabored respirations, symmetrical air entry; cough: absent; no respiratory distress; CTAB Heart: regular rate and rhythm.  Radial pulses 2+ symmetrical bilaterally Abdomen: soft, nondistended, normal active bowel sounds; nontender to palpation; no guarding  Skin: warm and dry Psychological: alert and cooperative; normal mood and affect  DIAGNOSTIC STUDIES:  No results found.   ASSESSMENT & PLAN:  1. Viral illness     No orders of the defined types were placed in this encounter.  Chest x-ray not significant for pneumonia. Will notify patient of abnormal results Go to drive thru testing for COVID at [address], [place] at the [place] building across for the hospital We will follow up with you regarding abnormal results.    In the meantime: You should remain isolated in your home for 7 days from  symptom onset AND greater than 72 hours after symptoms resolution (absence of fever without the use of fever-reducing medication and improvement in respiratory symptoms), whichever is longer Get plenty of rest and push fluids You may use OTC zyrtec and/or flonase as needed for congestion and/ or runny nose Take OTC tylenol as needed for fever, body aches, and/or chills Call or go to the ED if you have any new or worsening symptoms such as fever, worsening cough, shortness of breath, chest tightness, chest pain, turning blue, changes in mental status, etc...    Reviewed expectations re: course of current medical issues.  Questions answered. Outlined signs and symptoms indicating need for more acute intervention. Patient verbalized understanding. After Visit Summary given.         Lestine Box, PA-C 04/29/19 1643

## 2019-04-29 NOTE — ED Triage Notes (Signed)
Pt is immune suppressed and states tha she always feel unwell, pt states last week she felt worse , with off and on diarrhea and intermittent coughing and upper chest discomfort radiating to neck , also has c/o headache

## 2019-05-04 DIAGNOSIS — M546 Pain in thoracic spine: Secondary | ICD-10-CM | POA: Diagnosis not present

## 2019-05-04 DIAGNOSIS — M9902 Segmental and somatic dysfunction of thoracic region: Secondary | ICD-10-CM | POA: Diagnosis not present

## 2019-05-04 DIAGNOSIS — M542 Cervicalgia: Secondary | ICD-10-CM | POA: Diagnosis not present

## 2019-05-04 DIAGNOSIS — M9901 Segmental and somatic dysfunction of cervical region: Secondary | ICD-10-CM | POA: Diagnosis not present

## 2019-05-05 ENCOUNTER — Telehealth (HOSPITAL_COMMUNITY): Payer: Self-pay | Admitting: Emergency Medicine

## 2019-05-05 NOTE — Telephone Encounter (Signed)
Pt called asking about test results. Results not back, will contact patient when results are availaible.

## 2019-05-07 ENCOUNTER — Encounter (HOSPITAL_COMMUNITY): Payer: Self-pay | Admitting: Internal Medicine

## 2019-05-10 LAB — NOVEL CORONAVIRUS, NAA: SARS-CoV-2, NAA: NOT DETECTED

## 2019-05-11 DIAGNOSIS — M546 Pain in thoracic spine: Secondary | ICD-10-CM | POA: Diagnosis not present

## 2019-05-11 DIAGNOSIS — M542 Cervicalgia: Secondary | ICD-10-CM | POA: Diagnosis not present

## 2019-05-11 DIAGNOSIS — M9901 Segmental and somatic dysfunction of cervical region: Secondary | ICD-10-CM | POA: Diagnosis not present

## 2019-05-11 DIAGNOSIS — M9902 Segmental and somatic dysfunction of thoracic region: Secondary | ICD-10-CM | POA: Diagnosis not present

## 2019-05-25 DIAGNOSIS — M542 Cervicalgia: Secondary | ICD-10-CM | POA: Diagnosis not present

## 2019-05-25 DIAGNOSIS — M9902 Segmental and somatic dysfunction of thoracic region: Secondary | ICD-10-CM | POA: Diagnosis not present

## 2019-05-25 DIAGNOSIS — M546 Pain in thoracic spine: Secondary | ICD-10-CM | POA: Diagnosis not present

## 2019-05-25 DIAGNOSIS — M9901 Segmental and somatic dysfunction of cervical region: Secondary | ICD-10-CM | POA: Diagnosis not present

## 2019-06-02 DIAGNOSIS — R6882 Decreased libido: Secondary | ICD-10-CM | POA: Diagnosis not present

## 2019-06-02 DIAGNOSIS — E559 Vitamin D deficiency, unspecified: Secondary | ICD-10-CM | POA: Diagnosis not present

## 2019-06-02 DIAGNOSIS — H66009 Acute suppurative otitis media without spontaneous rupture of ear drum, unspecified ear: Secondary | ICD-10-CM | POA: Diagnosis not present

## 2019-06-02 DIAGNOSIS — R5383 Other fatigue: Secondary | ICD-10-CM | POA: Diagnosis not present

## 2019-06-08 DIAGNOSIS — R6882 Decreased libido: Secondary | ICD-10-CM | POA: Diagnosis not present

## 2019-06-08 DIAGNOSIS — R5383 Other fatigue: Secondary | ICD-10-CM | POA: Diagnosis not present

## 2019-06-13 DIAGNOSIS — M546 Pain in thoracic spine: Secondary | ICD-10-CM | POA: Diagnosis not present

## 2019-06-13 DIAGNOSIS — M542 Cervicalgia: Secondary | ICD-10-CM | POA: Diagnosis not present

## 2019-06-13 DIAGNOSIS — M9902 Segmental and somatic dysfunction of thoracic region: Secondary | ICD-10-CM | POA: Diagnosis not present

## 2019-06-13 DIAGNOSIS — M9901 Segmental and somatic dysfunction of cervical region: Secondary | ICD-10-CM | POA: Diagnosis not present

## 2019-06-20 ENCOUNTER — Emergency Department (HOSPITAL_COMMUNITY)
Admission: EM | Admit: 2019-06-20 | Discharge: 2019-06-20 | Disposition: A | Discharge status: Home / Self Care | Payer: BC Managed Care – PPO | Attending: Emergency Medicine | Admitting: Emergency Medicine

## 2019-06-20 ENCOUNTER — Emergency Department (HOSPITAL_COMMUNITY): Payer: BC Managed Care – PPO

## 2019-06-20 ENCOUNTER — Other Ambulatory Visit: Payer: Self-pay

## 2019-06-20 ENCOUNTER — Encounter (HOSPITAL_COMMUNITY): Payer: Self-pay | Admitting: Emergency Medicine

## 2019-06-20 DIAGNOSIS — N3090 Cystitis, unspecified without hematuria: Secondary | ICD-10-CM

## 2019-06-20 DIAGNOSIS — N390 Urinary tract infection, site not specified: Secondary | ICD-10-CM | POA: Diagnosis not present

## 2019-06-20 DIAGNOSIS — Z7982 Long term (current) use of aspirin: Secondary | ICD-10-CM | POA: Diagnosis not present

## 2019-06-20 DIAGNOSIS — Z79899 Other long term (current) drug therapy: Secondary | ICD-10-CM | POA: Diagnosis not present

## 2019-06-20 DIAGNOSIS — R102 Pelvic and perineal pain: Secondary | ICD-10-CM | POA: Diagnosis not present

## 2019-06-20 DIAGNOSIS — N3289 Other specified disorders of bladder: Secondary | ICD-10-CM | POA: Diagnosis not present

## 2019-06-20 LAB — URINALYSIS, ROUTINE W REFLEX MICROSCOPIC
Bacteria, UA: NONE SEEN
Bilirubin Urine: NEGATIVE
Glucose, UA: NEGATIVE mg/dL
Ketones, ur: NEGATIVE mg/dL
Nitrite: POSITIVE — AB
Protein, ur: 100 mg/dL — AB
RBC / HPF: >50 RBC/hpf — ABNORMAL HIGH (ref 0–5)
Specific Gravity, Urine: 1.02 (ref 1.005–1.030)
WBC, UA: >50 WBC/hpf — ABNORMAL HIGH (ref 0–5)
pH: 6 (ref 5.0–8.0)

## 2019-06-20 LAB — PREGNANCY, URINE: Preg Test, Ur: NEGATIVE

## 2019-06-20 MED ORDER — CEPHALEXIN 500 MG PO CAPS: 500.0000 mg | ORAL_CAPSULE | Freq: Four times a day (QID) | ORAL | 0 refills | Status: DC

## 2019-06-20 MED ORDER — CEFTRIAXONE SODIUM 1 G IJ SOLR
1.0000 g | Freq: Once | INTRAMUSCULAR | Status: AC
  Administered 2019-06-20: 1 g via INTRAMUSCULAR
  Filled 2019-06-20: qty 10

## 2019-06-20 NOTE — ED Provider Notes (Signed)
[facility] EMERGENCY DEPARTMENT Provider Note   CSN: [id number] Arrival date & time: 06/20/19  6195     History   Chief Complaint Chief Complaint  Patient presents with   Pelvic Pain    HPI Kara Anderson is a 43 y.o. female.      Pelvic Pain    Pt was seen at 0830. Per pt, c/o gradual onset and persistence of constant dysuria, urinary frequency and urgency for the past 2 days. Has been associated with "seeing some blood in my urine" and suprapubic pelvic "pressure" when urinating. Pt states the pain is now starting to radiate into her right flank, and she has been nauseated since last night. Pt states she "feels like I have a UTI." Denies fevers, no injury, no rash, no vomiting/diarrhea, no vaginal bleeding/discharge.    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[place]) 10/14/2018   Vasculitis ([place]) 12/19/2017   Mediastinal mass 11/04/2017   Pulmonary stenosis 11/04/2017   Bruit 10/31/2017   Murmur 10/31/2017   Breast mass, right 10/29/2015    Past Surgical History:  Procedure Laterality Date   HIP SURGERY  2013   IR THORACENTESIS ASP PLEURAL SPACE W/IMG GUIDE  11/20/2017   LAPAROSCOPIC ABDOMINAL EXPLORATION  2007,2009   endometriosis   MEDIASTINOTOMY CHAMBERLAIN MCNEIL Left 11/16/2017   Procedure: LEFT ANTERIOR MEDIASTINOTOMY CHAMBERLAIN PROCEDURE;  Surgeon: [name], MD;  Location: [place];  Service: Thoracic;  Laterality: Left;   NOSE SURGERY  2007     OB History    Gravida  0   Para  0   Term  0   Preterm  0   AB  0   Living  0     SAB  0   TAB  0   Ectopic  0   Multiple  0   Live Births           Obstetric Comments  1st Menstrual Cycle:  12          Home Medications    Prior to Admission  medications   Medication Sig Start Date End Date Taking? Authorizing Provider  aspirin EC 81 MG tablet Take 81 mg by mouth daily.    [provider]  Calcium-Phosphorus-Vitamin D (CALCIUM GUMMIES PO) Take 1 tablet by mouth daily.    [provider]  cetirizine (ZYRTEC) 10 MG tablet Take 10 mg by mouth daily.    [provider]  Cholecalciferol (VITAMIN D-3) 1000 units CAPS Take 1,000-2,000 Units by mouth daily.    [provider]  ibuprofen (ADVIL,MOTRIN) 200 MG tablet Take 200-600 mg by mouth daily as needed for moderate pain.     [provider]  loratadine (CLARITIN) 10 MG tablet Take 10 mg by mouth daily.    [provider]  montelukast (SINGULAIR) 10 MG tablet Take 10 mg daily by mouth. 10/22/17   [provider]  Multiple Vitamins-Minerals (MULTIVITAMIN GUMMIES WOMENS) CHEW Chew 2 tablets by mouth daily.     [provider]  mycophenolate (CELLCEPT) 500 MG tablet Take 1,500 mg by mouth 2 (two) times a day. 01/13/19 01/13/20  [provider]  NP THYROID 90 MG tablet TK 1 T PO D 09/30/18   [provider]  Probiotic Product (PROBIOTIC-10) CHEW Chew 1 tablet by mouth daily.    [provider]  Resveratrol 250 MG CAPS Take 500 mg by mouth.     [provider]  Sodium Chloride-Sodium Bicarb (NETI POT SINUS [place] NA) Place 1 Dose into the nose daily.    [provider]  Testosterone 25 MG/2.5GM (1%) GEL Apply topically.    [provider]  vitamin B-12 (CYANOCOBALAMIN) 1000 MCG tablet Take 3,000 mcg by mouth daily.    [provider]    Family History Family History  Problem Relation Age of Onset   Heart disease Mother    Stroke Mother    Diabetes Mother    Heart disease Father    Alcohol abuse Father     Social History Social History   Tobacco Use   Smoking status: Never Smoker   Smokeless tobacco: Never Used  Substance Use Topics   Alcohol  use: No    Alcohol/week: 0.0 standard drinks   Drug use: No     Allergies   Codeine   Review of Systems Review of Systems  Genitourinary: Positive for pelvic pain.  ROS: Statement: All systems negative except as marked or noted in the HPI; Constitutional: Negative for fever and chills. ; ; Eyes: Negative for eye pain, redness and discharge. ; ; ENMT: Negative for ear pain, hoarseness, nasal congestion, sinus pressure and sore throat. ; ; Cardiovascular: Negative for chest pain, palpitations, diaphoresis, dyspnea and peripheral edema. ; ; Respiratory: Negative for cough, wheezing and stridor. ; ; Gastrointestinal: Negative for nausea, vomiting, diarrhea, abdominal pain, blood in stool, hematemesis, jaundice and rectal bleeding. . ; ; Genitourinary: +dysuria, hematuria. ; ; Musculoskeletal: Negative for back pain and neck pain. Negative for swelling and trauma.; ; Skin: Negative for pruritus, rash, abrasions, blisters, bruising and skin lesion.; ; Neuro: Negative for headache, lightheadedness and neck stiffness. Negative for weakness, altered level of consciousness, altered mental status, extremity weakness, paresthesias, involuntary movement, seizure and syncope.      Physical Exam Updated Vital Signs BP 112/63 (BP Location: Left Arm)    Pulse 72    Temp 98.1 F (36.7 C) (Oral)    Resp 16    Ht 5\' 4"  (1.626 m)    Wt 71.2 kg    LMP 05/25/2019    SpO2 100%    BMI 26.95 kg/m   Physical Exam 0835: Physical examination:  Nursing notes reviewed; Vital signs and O2 SAT reviewed;  Constitutional: Well developed, Well nourished, Well hydrated, In no acute distress; Head:  Normocephalic, atraumatic; Eyes: EOMI, PERRL, No scleral icterus; ENMT: Mouth and pharynx normal, Mucous membranes moist; Neck: Supple, Full range of motion, No lymphadenopathy; Cardiovascular: Regular rate and rhythm, No gallop; Respiratory: Breath sounds clear & equal bilaterally, No wheezes.  Speaking full sentences with ease,  Normal respiratory effort/excursion; Chest: Nontender, Movement normal; Abdomen: Soft, +suprapubic tenderness to palp. No rebound or guarding. Nondistended, Normal bowel sounds; Genitourinary: No CVA tenderness; Spine:  No midline CS, TS, LS tenderness. +TTP right lumbar paraspinal muscles.;; Extremities: Peripheral pulses normal, No tenderness, No edema, No calf edema or asymmetry.; Neuro: AA&Ox3, Major CN grossly intact.  Speech clear. No gross focal motor or sensory deficits in extremities. Climbs on and off stretcher easily by herself. Gait steady..; Skin: Color normal, Warm, Dry.   ED Treatments / Results  Labs (all labs ordered are listed, but only abnormal results are displayed)   EKG None  Radiology   Procedures Procedures (including critical care  time)  Medications Ordered in ED Medications - No data to display   Initial Impression / Assessment and Plan / ED Course  I have reviewed the triage vital signs and the nursing notes.  Pertinent labs & imaging results that were available during my care of the patient were reviewed by me and considered in my medical decision making (see chart for details).    MDM Reviewed: previous chart, nursing note and vitals Interpretation: labs and CT scan   Results for orders placed or performed during the hospital encounter of 06/20/19  Pregnancy, urine  Result Value Ref Range   Preg Test, Ur NEGATIVE NEGATIVE  Urinalysis, Routine w reflex microscopic  Result Value Ref Range   Color, Urine YELLOW YELLOW   APPearance CLOUDY (A) CLEAR   Specific Gravity, Urine 1.020 1.005 - 1.030   pH 6.0 5.0 - 8.0   Glucose, UA NEGATIVE NEGATIVE mg/dL   Hgb urine dipstick LARGE (A) NEGATIVE   Bilirubin Urine NEGATIVE NEGATIVE   Ketones, ur NEGATIVE NEGATIVE mg/dL   Protein, ur 100 (A) NEGATIVE mg/dL   Nitrite POSITIVE (A) NEGATIVE   Leukocytes,Ua LARGE (A) NEGATIVE   RBC / HPF >50 (H) 0 - 5 RBC/hpf   WBC, UA >50 (H) 0 - 5 WBC/hpf   Bacteria,  UA NONE SEEN NONE SEEN   WBC Clumps PRESENT    Budding Yeast PRESENT    Ct Renal Stone Study Result Date: 06/20/2019 CLINICAL DATA:  Lower abdominal pain. Right flank pain for 3 days. Personal history of endometriosis. EXAM: CT ABDOMEN AND PELVIS WITHOUT CONTRAST TECHNIQUE: Multidetector CT imaging of the abdomen and pelvis was performed following the standard protocol without IV contrast. COMPARISON:  CT abdomen and pelvis 11/05/2017 FINDINGS: Lower chest: The lung bases are clear without focal nodule, mass, or airspace disease. The heart size is normal. No significant pleural or pericardial effusion is present. Hepatobiliary: No focal liver abnormality is seen. No gallstones, gallbladder wall thickening, or biliary dilatation. Pancreas: Unremarkable. No pancreatic ductal dilatation or surrounding inflammatory changes. Spleen: Normal in size without focal abnormality. Adrenals/Urinary Tract: Adrenal glands are unremarkable. Kidneys are normal, without renal calculi, focal lesion, or hydronephrosis. There is mild diffuse wall thickening about the urinary bladder. This may be due to under distension. Stomach/Bowel: The stomach and duodenum are within normal limits. Small bowel is unremarkable. Terminal ileum is within normal limits. Appendix is visualized and normal. The ascending and transverse colon are within normal limits. The descending and sigmoid colon are normal. Vascular/Lymphatic: No significant vascular findings are present. No enlarged abdominal or pelvic lymph nodes. Reproductive: Uterus is mildly edematous. Adnexa are unremarkable. There is free fluid in the adnexa bilaterally. Other: Free fluid is present in the pelvis as described. No significant ventral hernia is present. Musculoskeletal: Vertebral body heights and alignment are maintained. Bony pelvis is normal. The hips are located and within normal limits. IMPRESSION: 1. Mild diffuse wall thickening in the urinary bladder. Question UTI versus  under distention. 2. Kidneys and ureters are within normal limits bilaterally. 3. Mild edematous changes of the uterus and free fluid in the adnexal regions. Infection is considered less likely. Correlate with menstrual cycle. Electronically Signed   By: San Morelle M.D.   On: 06/20/2019 09:39   Kara Anderson was evaluated in Emergency Department on 06/20/2019 for the symptoms described in the history of present illness. She was evaluated in the context of the global COVID-19 pandemic, which necessitated consideration that the patient might be at risk  for infection with the SARS-CoV-2 virus that causes COVID-19. Institutional protocols and algorithms that pertain to the evaluation of patients at risk for COVID-19 are in a state of rapid change based on information released by regulatory bodies including the CDC and federal and state organizations. These policies and algorithms were followed during the patient's care in the ED.    1015:  Pt endorses menstrual cycle starting soon. +UTI, UC pending; Tx IM rocephin, PO keflex. Dx and testing, as well as incidental finding(s), d/w pt.  Questions answered.  Verb understanding, agreeable to d/c home with outpt f/u.     Final Clinical Impressions(s) / ED Diagnoses   Final diagnoses:  None    ED Discharge Orders    None       Francine Graven, DO 06/23/19 0818

## 2019-06-20 NOTE — ED Triage Notes (Signed)
Pt reports pelvic pressure with dysuria since Friday increasing. Has been taking Tylenol with relief until last night when it started not helping. Pain is moving into right flank starting last night. Denies fever.

## 2019-06-20 NOTE — Discharge Instructions (Signed)
Take the prescription as directed.  Call your regular medical doctor today to schedule a follow up appointment within the next 3 days.  Return to the Emergency Department immediately sooner if worsening.

## 2019-06-22 LAB — URINE CULTURE: Culture: 70000 — AB

## 2019-06-23 ENCOUNTER — Telehealth: Payer: Self-pay

## 2019-06-23 DIAGNOSIS — N3001 Acute cystitis with hematuria: Secondary | ICD-10-CM | POA: Diagnosis not present

## 2019-06-23 DIAGNOSIS — R197 Diarrhea, unspecified: Secondary | ICD-10-CM | POA: Diagnosis not present

## 2019-06-23 NOTE — Telephone Encounter (Signed)
Post ED Visit - Positive Culture Follow-up  Culture report reviewed by antimicrobial stewardship pharmacist: [place] Team []  Elenor Quinones, Pharm.D. []  Heide Guile, Pharm.D., BCPS AQ-ID []  Parks Neptune, Pharm.D., BCPS []  Alycia Rossetti, Pharm.D., BCPS []  [place], [place].D., BCPS, AAHIVP []  Legrand Como, Pharm.D., BCPS, AAHIVP []  Salome Arnt, PharmD, BCPS []  Johnnette Gourd, PharmD, BCPS [x]  Hughes Better, PharmD, BCPS []  Leeroy Cha, PharmD []  Laqueta Linden, PharmD, BCPS []  Albertina Parr, PharmD  [place] Team []  Leodis Sias, PharmD []  Lindell Spar, PharmD []  Royetta Asal, PharmD []  Graylin Shiver, Rph []  Rema Fendt) Glennon Mac, PharmD []  Arlyn Dunning, PharmD []  Netta Cedars, PharmD []  Dia Sitter, PharmD []  Leone Haven, PharmD []  Gretta Arab, PharmD []  Theodis Shove, PharmD []  Peggyann Juba, PharmD []  Reuel Boom, PharmD   Positive urine culture Treated with Cephalexin, organism sensitive to the same and no further patient follow-up is required at this time.  Genia Del 06/23/2019, 10:21 AM

## 2019-07-01 DIAGNOSIS — M314 Aortic arch syndrome [Takayasu]: Secondary | ICD-10-CM | POA: Diagnosis not present

## 2019-07-13 DIAGNOSIS — R1013 Epigastric pain: Secondary | ICD-10-CM | POA: Diagnosis not present

## 2019-07-13 DIAGNOSIS — R0600 Dyspnea, unspecified: Secondary | ICD-10-CM | POA: Diagnosis not present

## 2019-07-13 DIAGNOSIS — M314 Aortic arch syndrome [Takayasu]: Secondary | ICD-10-CM | POA: Diagnosis not present

## 2019-07-13 DIAGNOSIS — Z79899 Other long term (current) drug therapy: Secondary | ICD-10-CM | POA: Diagnosis not present

## 2019-07-25 DIAGNOSIS — R110 Nausea: Secondary | ICD-10-CM | POA: Diagnosis not present

## 2019-07-25 DIAGNOSIS — M25511 Pain in right shoulder: Secondary | ICD-10-CM | POA: Diagnosis not present

## 2019-07-25 DIAGNOSIS — R5383 Other fatigue: Secondary | ICD-10-CM | POA: Diagnosis not present

## 2019-07-25 DIAGNOSIS — R079 Chest pain, unspecified: Secondary | ICD-10-CM | POA: Diagnosis not present

## 2019-07-25 DIAGNOSIS — R0600 Dyspnea, unspecified: Secondary | ICD-10-CM | POA: Diagnosis not present

## 2019-07-25 DIAGNOSIS — M25512 Pain in left shoulder: Secondary | ICD-10-CM | POA: Diagnosis not present

## 2019-07-25 DIAGNOSIS — R0602 Shortness of breath: Secondary | ICD-10-CM | POA: Diagnosis not present

## 2019-07-25 DIAGNOSIS — M314 Aortic arch syndrome [Takayasu]: Secondary | ICD-10-CM | POA: Diagnosis not present

## 2019-07-25 DIAGNOSIS — R197 Diarrhea, unspecified: Secondary | ICD-10-CM | POA: Diagnosis not present

## 2019-07-25 DIAGNOSIS — E039 Hypothyroidism, unspecified: Secondary | ICD-10-CM | POA: Diagnosis not present

## 2019-07-26 DIAGNOSIS — Z1159 Encounter for screening for other viral diseases: Secondary | ICD-10-CM | POA: Diagnosis not present

## 2019-07-26 DIAGNOSIS — R5383 Other fatigue: Secondary | ICD-10-CM | POA: Diagnosis not present

## 2019-07-26 DIAGNOSIS — R197 Diarrhea, unspecified: Secondary | ICD-10-CM | POA: Diagnosis not present

## 2019-07-26 DIAGNOSIS — R0602 Shortness of breath: Secondary | ICD-10-CM | POA: Diagnosis not present

## 2019-07-26 DIAGNOSIS — M314 Aortic arch syndrome [Takayasu]: Secondary | ICD-10-CM | POA: Diagnosis not present

## 2019-07-26 DIAGNOSIS — R079 Chest pain, unspecified: Secondary | ICD-10-CM | POA: Diagnosis not present

## 2019-07-26 DIAGNOSIS — I791 Aortitis in diseases classified elsewhere: Secondary | ICD-10-CM | POA: Diagnosis not present

## 2019-07-26 DIAGNOSIS — M25511 Pain in right shoulder: Secondary | ICD-10-CM | POA: Diagnosis not present

## 2019-07-26 DIAGNOSIS — M25512 Pain in left shoulder: Secondary | ICD-10-CM | POA: Diagnosis not present

## 2019-07-26 DIAGNOSIS — R110 Nausea: Secondary | ICD-10-CM | POA: Diagnosis not present

## 2019-07-27 DIAGNOSIS — R0789 Other chest pain: Secondary | ICD-10-CM | POA: Diagnosis not present

## 2019-07-27 DIAGNOSIS — R079 Chest pain, unspecified: Secondary | ICD-10-CM | POA: Diagnosis not present

## 2019-07-27 DIAGNOSIS — R0600 Dyspnea, unspecified: Secondary | ICD-10-CM | POA: Diagnosis not present

## 2019-07-27 DIAGNOSIS — R5382 Chronic fatigue, unspecified: Secondary | ICD-10-CM | POA: Diagnosis not present

## 2019-07-28 DIAGNOSIS — Z1159 Encounter for screening for other viral diseases: Secondary | ICD-10-CM | POA: Diagnosis not present

## 2019-07-28 DIAGNOSIS — M314 Aortic arch syndrome [Takayasu]: Secondary | ICD-10-CM | POA: Diagnosis not present

## 2019-07-28 DIAGNOSIS — R079 Chest pain, unspecified: Secondary | ICD-10-CM | POA: Diagnosis not present

## 2019-07-28 DIAGNOSIS — I791 Aortitis in diseases classified elsewhere: Secondary | ICD-10-CM | POA: Diagnosis not present

## 2019-07-28 DIAGNOSIS — R0602 Shortness of breath: Secondary | ICD-10-CM | POA: Diagnosis not present

## 2019-07-28 DIAGNOSIS — R197 Diarrhea, unspecified: Secondary | ICD-10-CM | POA: Diagnosis not present

## 2019-07-29 DIAGNOSIS — R110 Nausea: Secondary | ICD-10-CM | POA: Diagnosis not present

## 2019-07-29 DIAGNOSIS — K3189 Other diseases of stomach and duodenum: Secondary | ICD-10-CM | POA: Diagnosis not present

## 2019-07-29 DIAGNOSIS — R5383 Other fatigue: Secondary | ICD-10-CM | POA: Diagnosis not present

## 2019-07-29 DIAGNOSIS — I776 Arteritis, unspecified: Secondary | ICD-10-CM | POA: Diagnosis not present

## 2019-07-29 DIAGNOSIS — R0602 Shortness of breath: Secondary | ICD-10-CM | POA: Diagnosis not present

## 2019-07-29 DIAGNOSIS — M314 Aortic arch syndrome [Takayasu]: Secondary | ICD-10-CM | POA: Diagnosis not present

## 2019-07-29 DIAGNOSIS — R079 Chest pain, unspecified: Secondary | ICD-10-CM | POA: Diagnosis not present

## 2019-08-01 DIAGNOSIS — R0602 Shortness of breath: Secondary | ICD-10-CM | POA: Diagnosis not present

## 2019-08-02 DIAGNOSIS — M314 Aortic arch syndrome [Takayasu]: Secondary | ICD-10-CM | POA: Diagnosis not present

## 2019-08-02 DIAGNOSIS — R0600 Dyspnea, unspecified: Secondary | ICD-10-CM | POA: Diagnosis not present

## 2019-08-02 DIAGNOSIS — R0789 Other chest pain: Secondary | ICD-10-CM | POA: Insufficient documentation

## 2019-08-02 DIAGNOSIS — R079 Chest pain, unspecified: Secondary | ICD-10-CM | POA: Diagnosis not present

## 2019-08-02 DIAGNOSIS — R0609 Other forms of dyspnea: Secondary | ICD-10-CM | POA: Diagnosis not present

## 2019-08-03 DIAGNOSIS — I7789 Other specified disorders of arteries and arterioles: Secondary | ICD-10-CM | POA: Diagnosis not present

## 2019-08-03 DIAGNOSIS — R0789 Other chest pain: Secondary | ICD-10-CM | POA: Diagnosis not present

## 2019-08-18 DIAGNOSIS — J9859 Other diseases of mediastinum, not elsewhere classified: Secondary | ICD-10-CM | POA: Diagnosis not present

## 2019-08-23 DIAGNOSIS — I288 Other diseases of pulmonary vessels: Secondary | ICD-10-CM | POA: Diagnosis not present

## 2019-08-23 DIAGNOSIS — R599 Enlarged lymph nodes, unspecified: Secondary | ICD-10-CM | POA: Diagnosis not present

## 2019-09-16 DIAGNOSIS — M9902 Segmental and somatic dysfunction of thoracic region: Secondary | ICD-10-CM | POA: Diagnosis not present

## 2019-09-16 DIAGNOSIS — M9903 Segmental and somatic dysfunction of lumbar region: Secondary | ICD-10-CM | POA: Diagnosis not present

## 2019-09-16 DIAGNOSIS — M545 Low back pain: Secondary | ICD-10-CM | POA: Diagnosis not present

## 2019-09-16 DIAGNOSIS — M546 Pain in thoracic spine: Secondary | ICD-10-CM | POA: Diagnosis not present

## 2019-09-28 DIAGNOSIS — M545 Low back pain: Secondary | ICD-10-CM | POA: Diagnosis not present

## 2019-09-28 DIAGNOSIS — M9903 Segmental and somatic dysfunction of lumbar region: Secondary | ICD-10-CM | POA: Diagnosis not present

## 2019-09-28 DIAGNOSIS — M9902 Segmental and somatic dysfunction of thoracic region: Secondary | ICD-10-CM | POA: Diagnosis not present

## 2019-09-28 DIAGNOSIS — M546 Pain in thoracic spine: Secondary | ICD-10-CM | POA: Diagnosis not present

## 2019-09-29 ENCOUNTER — Ambulatory Visit (INDEPENDENT_AMBULATORY_CARE_PROVIDER_SITE_OTHER): Payer: BC Managed Care – PPO | Admitting: Internal Medicine

## 2019-09-29 ENCOUNTER — Encounter (INDEPENDENT_AMBULATORY_CARE_PROVIDER_SITE_OTHER): Payer: Self-pay | Admitting: Internal Medicine

## 2019-09-29 ENCOUNTER — Other Ambulatory Visit: Payer: Self-pay

## 2019-09-29 VITALS — BP 100/60 | HR 60 | Ht 64.0 in | Wt 158.0 lb

## 2019-09-29 DIAGNOSIS — E559 Vitamin D deficiency, unspecified: Secondary | ICD-10-CM | POA: Insufficient documentation

## 2019-09-29 DIAGNOSIS — E785 Hyperlipidemia, unspecified: Secondary | ICD-10-CM | POA: Insufficient documentation

## 2019-09-29 DIAGNOSIS — R6882 Decreased libido: Secondary | ICD-10-CM | POA: Insufficient documentation

## 2019-09-29 DIAGNOSIS — Z0001 Encounter for general adult medical examination with abnormal findings: Secondary | ICD-10-CM | POA: Diagnosis not present

## 2019-09-29 DIAGNOSIS — R5381 Other malaise: Secondary | ICD-10-CM

## 2019-09-29 DIAGNOSIS — E782 Mixed hyperlipidemia: Secondary | ICD-10-CM | POA: Diagnosis not present

## 2019-09-29 DIAGNOSIS — R5383 Other fatigue: Secondary | ICD-10-CM

## 2019-09-29 HISTORY — DX: Hyperlipidemia, unspecified: E78.5

## 2019-09-29 HISTORY — DX: Vitamin D deficiency, unspecified: E55.9

## 2019-09-29 HISTORY — DX: Decreased libido: R68.82

## 2019-09-29 HISTORY — DX: Other malaise: R53.81

## 2019-09-29 NOTE — Progress Notes (Signed)
Chief Complaint: This delightful 43 year old lady comes in for an annual physical exam and to address her chronic conditions. HPI: She went to the [facility] in August to have [place] another opinion regarding her symptoms and eventually a cardiologist did help her and has put her on nitrates which seems to help her symptoms significantly with dyspnea and fatigue.  However, when she was there, she also saw several specialists including an endocrinologist who told her that she should not be taking desiccated thyroid nor testosterone.  The patient then discontinue NP thyroid altogether and however is still taking testosterone. In the past, the dose of NP thyroid 120 mg daily did seem to make her feel better and also testosterone therapy definitely helped her. There was concern regarding the NP thyroid of tachycardia but studies do not show this.  Past Medical History:  Diagnosis Date  . Decreased libido 09/29/2019  . Endometriosis 2007,2009  . HLD (hyperlipidemia) 09/29/2019  . Malaise and fatigue 09/29/2019  . Mediastinal adenopathy   . Mononucleosis   . Seasonal allergies   . Strep throat   . Viral meningitis   . Vitamin D deficiency disease 09/29/2019   Past Surgical History:  Procedure Laterality Date  . HIP SURGERY  2013  . IR THORACENTESIS ASP PLEURAL SPACE W/IMG GUIDE  11/20/2017  . LAPAROSCOPIC ABDOMINAL EXPLORATION  2007,2009   endometriosis  . MEDIASTINOTOMY CHAMBERLAIN MCNEIL Left 11/16/2017   Procedure: LEFT ANTERIOR MEDIASTINOTOMY CHAMBERLAIN PROCEDURE;  Surgeon: [name], MD;  Location: [place];  Service: Thoracic;  Laterality: Left;  . 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        Allergies:  Allergies  Allergen Reactions  . Codeine Nausea Only     Current Meds  Medication Sig  . aspirin EC 81 MG tablet Take 81 mg by mouth daily.   . Cholecalciferol (VITAMIN D-3) 125 MCG (5000 UT) TABS Take 5,000 Units by mouth every other day.   . isosorbide mononitrate (IMDUR) 30 MG 24 hr tablet Take 1 tablet by mouth daily.  Marland Kitchen loratadine (CLARITIN) 10 MG tablet Take 10 mg by mouth daily.  . montelukast (SINGULAIR) 10 MG tablet Take 10 mg daily by mouth.  . mycophenolate (CELLCEPT) 500 MG tablet Take 1,500 mg by mouth 2 (two) times a day.  Marland Kitchen Resveratrol 250 MG CAPS Take 500 mg by mouth.   . Testosterone 20 % CREA Apply 10 mg topically daily.  . vitamin B-12 (CYANOCOBALAMIN) 1000 MCG tablet Take 3,000 mcg by mouth daily.     Nutrition She is trying to figure out what is the best diet for her and we discussed this at length today.  She is thinking about a vegan diet which I have encouraged but I have told her to make sure that she takes B12 supplementation.  Sleep Adequate sleep, uninterrupted.  Exercise She has begin to exercise again.  Bio-identical hormones Testosterone therapy is being used off label for symptoms of testosterone deficiency and benefits that it produces based on several studies.  These benefits include decreasing body fat, increasing in lean muscle mass and increasing in bone density.  There is improvement of memory, cognition.  There is improvement in exercise tolerance and endurance.  Testosterone therapy has also been shown to be protective against coronary artery disease, cerebrovascular disease, diabetes, hypertension and degenerative joint disease. I have discussed with  the patient the FDA warnings regarding testosterone therapy, benefits and side effects and modes of administration as well as monitoring blood levels and side effects  on a regular basis The patient is agreeable that testosterone therapy should be an integral part of his/her wellness,quality of life and prevention of chronic disease.  [id number] from the symptoms mentioned above,there are no other symptoms referable to all systems reviewed.   Physical Exam: Blood pressure 100/60, pulse 60, height 5\' 4"  (1.626 m), weight 158 lb (71.7 kg). Vitals with BMI 09/29/2019 06/20/2019 04/29/2019  Height 5\' 4"  5\' 4"  -  Weight 158 lbs 157 lbs -  BMI 0000000 0000000 -  Systolic [id number]  Diastolic 60 63 80  Pulse 60 72 80      [name]. General: Alert, cooperative, and appears to be the stated age.No pallor.  No jaundice.  No clubbing. Head: Normocephalic Eyes: Sclera white, pupils equal and reactive to light, red reflex x 2,  Ears: Normal bilaterally Oral cavity: Lips, mucosa, and tongue normal: Teeth and gums normal Neck: No adenopathy, supple, symmetrical, trachea midline, and thyroid does not appear enlarged Respiratory: Clear to auscultation bilaterally.No wheezing, crackles or bronchial breathing. Breasts: No masses found. Cardiovascular: Heart sounds are present and appear to be normal without murmurs or added sounds.  No carotid bruits.  Peripheral pulses are present and equal bilaterally.: Gastrointestinal:positive bowel sounds, no hepatosplenomegaly.  No masses felt.No tenderness. Skin: Clear, No rashes noted.No worrisome skin lesions seen. Neurological: Grossly intact without focal findings, cranial nerves II through XII intact, muscle strength equal bilaterally Musculoskeletal: No acute joint abnormalities noted.Full range of movement noted with joints. Psychiatric: Affect appropriate, non-anxious.    Assessment  1. Encounter for general adult medical examination with abnormal findings   2. Mixed hyperlipidemia   3. Vitamin D deficiency disease   4. Malaise and fatigue   5. Decreased libido     Tests Ordered:  No orders of the defined types were placed in this encounter.    Plan  1. She appears healthy today and interestingly no murmur was heard. 2. I recommended that she go back on the NP thyroid at 60 mg daily and she has tablets of these and gradually increase 120 mg daily.  She has tablets  of all of these at home.  She had many questions regarding long-term effects of NP thyroid and I have reassured her that studies do not show any problems in the long-term. 3. In terms of testosterone therapy, she needs a refill of this and I will call [facility] so that she is taking a dose of 10 mg applied to the labia every day.  This seems to be a good dose for her without side effects. 4. I also recommended she go back up to vitamin D3 10,000 units daily.  She was told to reduce the dose because her vitamin D level was 106 without the presence of hypercalcemia. 5. I will see her back in a couple of months time and see how she is doing and we will check blood work then. 6. Influenza vaccination was given today. 7. Today, in addition to a preventative visit, I performed an office visit to address her questions of her symptoms above.     No orders of the defined types were placed in this encounter.    Monetta Lick C Shed Nixon   09/29/2019, 12:13 PM

## 2019-10-07 DIAGNOSIS — M9903 Segmental and somatic dysfunction of lumbar region: Secondary | ICD-10-CM | POA: Diagnosis not present

## 2019-10-07 DIAGNOSIS — M546 Pain in thoracic spine: Secondary | ICD-10-CM | POA: Diagnosis not present

## 2019-10-07 DIAGNOSIS — M9902 Segmental and somatic dysfunction of thoracic region: Secondary | ICD-10-CM | POA: Diagnosis not present

## 2019-10-07 DIAGNOSIS — M545 Low back pain: Secondary | ICD-10-CM | POA: Diagnosis not present

## 2019-10-10 ENCOUNTER — Telehealth (INDEPENDENT_AMBULATORY_CARE_PROVIDER_SITE_OTHER): Payer: Self-pay

## 2019-10-10 NOTE — Telephone Encounter (Signed)
Patient wants you to call her.  Would not say why, just wants to talk to you.

## 2019-10-11 ENCOUNTER — Other Ambulatory Visit (INDEPENDENT_AMBULATORY_CARE_PROVIDER_SITE_OTHER): Payer: Self-pay | Admitting: Internal Medicine

## 2019-10-11 ENCOUNTER — Telehealth (INDEPENDENT_AMBULATORY_CARE_PROVIDER_SITE_OTHER): Payer: Self-pay

## 2019-10-11 DIAGNOSIS — N3000 Acute cystitis without hematuria: Secondary | ICD-10-CM

## 2019-10-11 NOTE — Telephone Encounter (Signed)
She came in today for a u/a

## 2019-10-11 NOTE — Telephone Encounter (Signed)
Pt gave sample;will send to [facility] today.

## 2019-10-12 ENCOUNTER — Other Ambulatory Visit (INDEPENDENT_AMBULATORY_CARE_PROVIDER_SITE_OTHER): Payer: Self-pay | Admitting: Internal Medicine

## 2019-10-13 ENCOUNTER — Encounter (INDEPENDENT_AMBULATORY_CARE_PROVIDER_SITE_OTHER): Payer: Self-pay | Admitting: Internal Medicine

## 2019-10-13 ENCOUNTER — Ambulatory Visit (INDEPENDENT_AMBULATORY_CARE_PROVIDER_SITE_OTHER): Payer: BC Managed Care – PPO | Admitting: Internal Medicine

## 2019-10-13 ENCOUNTER — Telehealth (INDEPENDENT_AMBULATORY_CARE_PROVIDER_SITE_OTHER): Payer: BC Managed Care – PPO | Admitting: Internal Medicine

## 2019-10-13 DIAGNOSIS — N3000 Acute cystitis without hematuria: Secondary | ICD-10-CM | POA: Diagnosis not present

## 2019-10-13 LAB — URINE CULTURE

## 2019-10-13 MED ORDER — CIPROFLOXACIN HCL 500 MG PO TABS: 500.0000 mg | ORAL_TABLET | Freq: Two times a day (BID) | ORAL | 0 refills | Status: DC

## 2019-10-13 NOTE — Progress Notes (Signed)
Metrics: Intervention Frequency ACO  Documented Smoking Status Yearly  Screened one or more times in 24 months  Cessation Counseling or  Active cessation medication Past 24 months  Past 24 months   Guideline developer: UpToDate (See UpToDate for funding source) Date Released: 2014       Wellness Office Visit  Subjective:  Patient ID: Kara Anderson, female    DOB: 1976-05-19  Age: 43 y.o. MRN: [id number]  CC: This is an audiovisual telemedicine visit with the permission of the patient who is at work and I am in my office.  The patient was easily identified on video.  This is an acute visit with symptoms of dysuria. HPI  The patient has had symptoms of dysuria and increased frequency of micturition and urgency for the last several days.  She denies any nausea, vomiting, abdominal pain or fever.  She denies any hematuria. She tells me that since her nitrate dose has been increased, her exercise tolerance is significantly improved and she is exercising which is resulting in a lot of sweating. Past Medical History:  Diagnosis Date  . Decreased libido 09/29/2019  . Endometriosis 2007,2009  . HLD (hyperlipidemia) 09/29/2019  . Malaise and fatigue 09/29/2019  . Mediastinal adenopathy   . Mononucleosis   . Seasonal allergies   . Strep throat   . Viral meningitis   . Vitamin D deficiency disease 09/29/2019      Family History  Problem Relation Age of Onset  . Heart disease Mother   . Stroke Mother   . Diabetes Mother   . Heart disease Father   . 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   Social History   Tobacco Use  . Smoking status: Never Smoker  . Smokeless tobacco: Never Used  Substance Use Topics  . Alcohol use: No    Alcohol/week: 0.0 standard drinks    Current Meds  Medication Sig  . aspirin EC 81 MG tablet Take 81 mg by mouth daily.  .  Cholecalciferol (VITAMIN D-3) 125 MCG (5000 UT) TABS Take 5,000 Units by mouth every other day.   . ciprofloxacin (CIPRO) 500 MG tablet Take 1 tablet (500 mg total) by mouth 2 (two) times daily.  . isosorbide mononitrate (IMDUR) 30 MG 24 hr tablet Take 1.5 tablets by mouth daily.   Marland Kitchen loratadine (CLARITIN) 10 MG tablet Take 10 mg by mouth daily.  . montelukast (SINGULAIR) 10 MG tablet TAKE 1 TABLET BY MOUTH DAILY  . mycophenolate (CELLCEPT) 500 MG tablet Take 1,500 mg by mouth 2 (two) times a day.  Marland Kitchen Resveratrol 250 MG CAPS Take 500 mg by mouth.   . Testosterone 20 % CREA Apply 10 mg topically daily.  . vitamin B-12 (CYANOCOBALAMIN) 1000 MCG tablet Take 3,000 mcg by mouth daily.     Objective:   Today's Vitals: There were no vitals taken for this visit. Vitals with BMI 09/29/2019 06/20/2019 04/29/2019  Height 5\' 4"  5\' 4"  -  Weight 158 lbs 157 lbs -  BMI 0000000 0000000 -  Systolic [id number]  Diastolic 60 63 80  Pulse 60 72 80     Physical Exam    On video she appears to be systemically well and is alert and orientated.   Assessment   1. Acute cystitis without hematuria       Tests ordered No orders of the defined types  were placed in this encounter.    Plan: 1. I think that this may be a UTI although urine culture recently when she presented with a urine sample does not indicate a organism.  It may be related to hygiene and I have told her that after she exercises she must take a shower with soap and water to possibly eliminate any issues regarding urinary symptoms.  I have told her that if symptoms get worse, she should go ahead and take the ciprofloxacin that I have sent to her pharmacy.  Otherwise, she can await improvement without the antibiotics.   Meds ordered this encounter  Medications  . ciprofloxacin (CIPRO) 500 MG tablet    Sig: Take 1 tablet (500 mg total) by mouth 2 (two) times daily.    Dispense:  6 tablet    Refill:  0    Alissah Redmon Luther Parody, MD

## 2019-10-14 DIAGNOSIS — M9903 Segmental and somatic dysfunction of lumbar region: Secondary | ICD-10-CM | POA: Diagnosis not present

## 2019-10-14 DIAGNOSIS — M545 Low back pain: Secondary | ICD-10-CM | POA: Diagnosis not present

## 2019-10-14 DIAGNOSIS — M546 Pain in thoracic spine: Secondary | ICD-10-CM | POA: Diagnosis not present

## 2019-10-14 DIAGNOSIS — M9902 Segmental and somatic dysfunction of thoracic region: Secondary | ICD-10-CM | POA: Diagnosis not present

## 2019-10-18 DIAGNOSIS — Q256 Stenosis of pulmonary artery: Secondary | ICD-10-CM | POA: Diagnosis not present

## 2019-10-18 DIAGNOSIS — M314 Aortic arch syndrome [Takayasu]: Secondary | ICD-10-CM | POA: Diagnosis not present

## 2019-10-21 DIAGNOSIS — M9903 Segmental and somatic dysfunction of lumbar region: Secondary | ICD-10-CM | POA: Diagnosis not present

## 2019-10-21 DIAGNOSIS — M545 Low back pain: Secondary | ICD-10-CM | POA: Diagnosis not present

## 2019-10-21 DIAGNOSIS — M546 Pain in thoracic spine: Secondary | ICD-10-CM | POA: Diagnosis not present

## 2019-10-21 DIAGNOSIS — M9902 Segmental and somatic dysfunction of thoracic region: Secondary | ICD-10-CM | POA: Diagnosis not present

## 2019-10-31 ENCOUNTER — Other Ambulatory Visit (INDEPENDENT_AMBULATORY_CARE_PROVIDER_SITE_OTHER): Payer: Self-pay | Admitting: Internal Medicine

## 2019-10-31 DIAGNOSIS — N390 Urinary tract infection, site not specified: Secondary | ICD-10-CM | POA: Diagnosis not present

## 2019-11-16 ENCOUNTER — Other Ambulatory Visit: Payer: Self-pay

## 2019-11-16 ENCOUNTER — Encounter (INDEPENDENT_AMBULATORY_CARE_PROVIDER_SITE_OTHER): Payer: Self-pay | Admitting: Internal Medicine

## 2019-11-16 ENCOUNTER — Ambulatory Visit (INDEPENDENT_AMBULATORY_CARE_PROVIDER_SITE_OTHER): Payer: BC Managed Care – PPO | Admitting: Internal Medicine

## 2019-11-16 VITALS — BP 110/72 | HR 97 | Temp 98.1°F | Resp 18 | Ht 65.0 in | Wt 153.0 lb

## 2019-11-16 DIAGNOSIS — J029 Acute pharyngitis, unspecified: Secondary | ICD-10-CM

## 2019-11-16 DIAGNOSIS — I776 Arteritis, unspecified: Secondary | ICD-10-CM | POA: Diagnosis not present

## 2019-11-16 DIAGNOSIS — R0600 Dyspnea, unspecified: Secondary | ICD-10-CM | POA: Diagnosis not present

## 2019-11-16 DIAGNOSIS — Z79899 Other long term (current) drug therapy: Secondary | ICD-10-CM | POA: Diagnosis not present

## 2019-11-16 MED ORDER — PREDNISONE 20 MG PO TABS: 40.0000 mg | ORAL_TABLET | Freq: Every day | ORAL | 1 refills | Status: DC

## 2019-11-16 MED ORDER — AMOXICILLIN 500 MG PO CAPS: 500.0000 mg | ORAL_CAPSULE | Freq: Three times a day (TID) | ORAL | 0 refills | Status: DC

## 2019-11-16 NOTE — Progress Notes (Signed)
Metrics: Intervention Frequency ACO  Documented Smoking Status Yearly  Screened one or more times in 24 months  Cessation Counseling or  Active cessation medication Past 24 months  Past 24 months   Guideline developer: UpToDate (See UpToDate for funding source) Date Released: 2014       Wellness Office Visit  Subjective:  Patient ID: Kara Anderson, female    DOB: 10-Apr-1976  Age: 43 y.o. MRN: [id number]  CC: Sore throat HPI  This lady comes in as an acute visit with a 1 week history of the above symptoms.  She also describes some postnasal drainage and nasal congestion.  She describes intermittent sneezing and also some nosebleeding.  She denies any fever.  There is no significant cough and there is no increasing dyspnea.  She went to see a rheumatologist who told her to come to see me to make sure she did not have streptococcal throat. Past Medical History:  Diagnosis Date  . Decreased libido 09/29/2019  . Endometriosis 2007,2009  . HLD (hyperlipidemia) 09/29/2019  . Malaise and fatigue 09/29/2019  . Mediastinal adenopathy   . Mononucleosis   . Seasonal allergies   . Strep throat   . Viral meningitis   . Vitamin D deficiency disease 09/29/2019      Family History  Problem Relation Age of Onset  . Heart disease Mother   . Stroke Mother   . Diabetes Mother   . Heart disease Father   . 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   Social History   Tobacco Use  . Smoking status: Never Smoker  . Smokeless tobacco: Never Used  Substance Use Topics  . Alcohol use: No    Alcohol/week: 0.0 standard drinks    Current Meds  Medication Sig  . aspirin EC 81 MG tablet Take 81 mg by mouth daily.  . Cholecalciferol (VITAMIN D-3) 125 MCG (5000 UT) TABS Take 5,000 Units by mouth every other day.   . isosorbide mononitrate (IMDUR) 30 MG 24 hr tablet Take 1.5  tablets by mouth daily.   Marland Kitchen loratadine (CLARITIN) 10 MG tablet Take 10 mg by mouth daily.  . montelukast (SINGULAIR) 10 MG tablet TAKE 1 TABLET BY MOUTH DAILY  . mycophenolate (CELLCEPT) 500 MG tablet Take 1,500 mg by mouth 2 (two) times a day.  Marland Kitchen Resveratrol 250 MG CAPS Take 500 mg by mouth.   . Testosterone 20 % CREA Apply 10 mg topically daily.  . vitamin B-12 (CYANOCOBALAMIN) 1000 MCG tablet Take 3,000 mcg by mouth daily.       Objective:   Today's Vitals: BP 110/72 (BP Location: Left Arm, Patient Position: Sitting, Cuff Size: Normal)   Pulse 97   Temp 98.1 F (36.7 C) (Temporal)   Resp 18   Ht 5\' 5"  (1.651 m)   Wt 153 lb (69.4 kg)   SpO2 98% Comment: with mask.  BMI 25.46 kg/m  Vitals with BMI 11/16/2019 09/29/2019 06/20/2019  Height 5\' 5"  5\' 4"  5\' 4"   Weight 153 lbs 158 lbs 157 lbs  BMI 25.46 0000000 0000000  Systolic [id number]  Diastolic 72 60 63  Pulse 97 60 72     Physical Exam  [name].  Her throat is very mildly inflamed with no evidence of exudates or enlarged tonsils.  There is no neck lymphadenopathy.  There is no sinus tenderness.  Assessment   1. Pharyngitis, unspecified etiology       Tests ordered No orders of the defined types were placed in this encounter.    Plan: 1. She underwent a strep test which was negative.  I suspect this is either viral or allergic in nature.  I am less suspicious of COVID-19 disease at this present time. 2. I have sent prescriptions for short course of prednisone and amoxicillin should she require it but she is willing to monitor and see if she will improve spontaneously.  She knows what to do if she gets worse.   Meds ordered this encounter  Medications  . predniSONE (DELTASONE) 20 MG tablet    Sig: Take 2 tablets (40 mg total) by mouth daily with breakfast.    Dispense:  10 tablet    Refill:  1  . amoxicillin (AMOXIL) 500 MG capsule    Sig: Take 1 capsule (500 mg total) by mouth 3  (three) times daily.    Dispense:  15 capsule    Refill:  0    Breane Grunwald Luther Parody, MD

## 2019-11-21 DIAGNOSIS — M9902 Segmental and somatic dysfunction of thoracic region: Secondary | ICD-10-CM | POA: Diagnosis not present

## 2019-11-21 DIAGNOSIS — M9903 Segmental and somatic dysfunction of lumbar region: Secondary | ICD-10-CM | POA: Diagnosis not present

## 2019-11-21 DIAGNOSIS — M546 Pain in thoracic spine: Secondary | ICD-10-CM | POA: Diagnosis not present

## 2019-11-21 DIAGNOSIS — M545 Low back pain: Secondary | ICD-10-CM | POA: Diagnosis not present

## 2019-12-05 DIAGNOSIS — M546 Pain in thoracic spine: Secondary | ICD-10-CM | POA: Diagnosis not present

## 2019-12-05 DIAGNOSIS — M9902 Segmental and somatic dysfunction of thoracic region: Secondary | ICD-10-CM | POA: Diagnosis not present

## 2019-12-05 DIAGNOSIS — M542 Cervicalgia: Secondary | ICD-10-CM | POA: Diagnosis not present

## 2019-12-05 DIAGNOSIS — M9901 Segmental and somatic dysfunction of cervical region: Secondary | ICD-10-CM | POA: Diagnosis not present

## 2019-12-08 ENCOUNTER — Encounter (INDEPENDENT_AMBULATORY_CARE_PROVIDER_SITE_OTHER): Payer: Self-pay | Admitting: Internal Medicine

## 2019-12-08 ENCOUNTER — Ambulatory Visit (INDEPENDENT_AMBULATORY_CARE_PROVIDER_SITE_OTHER): Payer: BC Managed Care – PPO | Admitting: Internal Medicine

## 2019-12-08 ENCOUNTER — Other Ambulatory Visit: Payer: Self-pay

## 2019-12-08 VITALS — BP 90/68 | HR 80 | Temp 98.3°F | Ht 64.0 in | Wt 158.0 lb

## 2019-12-08 DIAGNOSIS — R5381 Other malaise: Secondary | ICD-10-CM

## 2019-12-08 DIAGNOSIS — E2839 Other primary ovarian failure: Secondary | ICD-10-CM | POA: Diagnosis not present

## 2019-12-08 DIAGNOSIS — R6882 Decreased libido: Secondary | ICD-10-CM | POA: Diagnosis not present

## 2019-12-08 DIAGNOSIS — E559 Vitamin D deficiency, unspecified: Secondary | ICD-10-CM | POA: Diagnosis not present

## 2019-12-08 DIAGNOSIS — R5383 Other fatigue: Secondary | ICD-10-CM

## 2019-12-08 NOTE — Progress Notes (Signed)
Metrics: Intervention Frequency ACO  Documented Smoking Status Yearly  Screened one or more times in 24 months  Cessation Counseling or  Active cessation medication Past 24 months  Past 24 months   Guideline developer: UpToDate (See UpToDate for funding source) Date Released: 2014       Wellness Office Visit  Subjective:  Patient ID: Kara Anderson, female    DOB: 12-20-1975  Age: 43 y.o. MRN: [id number]  CC: This lady comes in for follow-up of fatigue, hypogonadism, vitamin D deficiency. HPI  I had seen her in the beginning of December with a sore throat and she does feel better but feels somewhat fatigued and I wonder if in retrospect this was a viral illness.  I doubt that she had COVID-19 but she was never tested and I suppose this is possible. I had increased her testosterone cream to 10 mg daily to be applied to the labia but she feels not as well as she did on her previous dose which is somewhat unusual. She is supposed be taking vitamin D3 10,000 units a day but I do not think she is consistent with this. Past Medical History:  Diagnosis Date  . Decreased libido 09/29/2019  . Endometriosis 2007,2009  . HLD (hyperlipidemia) 09/29/2019  . Malaise and fatigue 09/29/2019  . Mediastinal adenopathy   . Mononucleosis   . Seasonal allergies   . Strep throat   . Viral meningitis   . Vitamin D deficiency disease 09/29/2019      Family History  Problem Relation Age of Onset  . Heart disease Mother   . Stroke Mother   . Diabetes Mother   . Heart disease Father   . 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   Social History   Tobacco Use  . Smoking status: Never Smoker  . Smokeless tobacco: Never Used  Substance Use Topics  . Alcohol use: No    Alcohol/week: 0.0 standard drinks    Current Meds  Medication Sig  . aspirin EC 81 MG tablet Take 81 mg by  mouth daily.  . Cholecalciferol (VITAMIN D-3) 125 MCG (5000 UT) TABS Take 10,000 Units by mouth every other day.   . isosorbide mononitrate (IMDUR) 60 MG 24 hr tablet Take 1 tablet by mouth daily.   Marland Kitchen loratadine (CLARITIN) 10 MG tablet Take 10 mg by mouth daily.  . montelukast (SINGULAIR) 10 MG tablet TAKE 1 TABLET BY MOUTH DAILY  . mycophenolate (CELLCEPT) 500 MG tablet Take 1,500 mg by mouth 2 (two) times a day. 1500 in the morning and 1000 at night  . NP THYROID 120 MG tablet Take 120 mg by mouth daily before breakfast.  . Testosterone 20 % CREA Apply 10 mg topically daily.  . vitamin B-12 (CYANOCOBALAMIN) 1000 MCG tablet Take 3,000 mcg by mouth daily.  . [DISCONTINUED] amoxicillin (AMOXIL) 500 MG capsule Take 1 capsule (500 mg total) by mouth 3 (three) times daily.  . [DISCONTINUED] predniSONE (DELTASONE) 20 MG tablet Take 2 tablets (40 mg total) by mouth daily with breakfast.  . [DISCONTINUED] Resveratrol 250 MG CAPS Take 500 mg by mouth.      Nutrition  Continues with a whole food plant-based diet. Sleep  Adequate.  Exercise  She tries to exercise regularly 3-4 times a week. Bio Identical Hormones  Testosterone therapy is being used off label for symptoms of testosterone deficiency and  benefits that it produces based on several studies.  These benefits include decreasing body fat, increasing in lean muscle mass and increasing in bone density.  There is improvement of memory, cognition.  There is improvement in exercise tolerance and endurance.  Testosterone therapy has also been shown to be protective against coronary artery disease, cerebrovascular disease, diabetes, hypertension and degenerative joint disease. I have discussed with the patient the FDA warnings regarding testosterone therapy, benefits and side effects and modes of administration as well as monitoring blood levels and side effects  on a regular basis The patient is agreeable that testosterone therapy should be an  integral part of his/her wellness,quality of life and prevention of chronic disease.  This patient is being treated with desiccated thyroid, off label, for symptoms of thyroid deficiency.  The patient has been counseled regarding side effects and how to deal with them.  Objective:   Today's Vitals: BP 90/68 (BP Location: Left Arm, Patient Position: Sitting, Cuff Size: Normal)   Pulse 80   Temp 98.3 F (36.8 C) (Temporal)   Ht 5' 4"  (1.626 m)   Wt 158 lb (71.7 kg)   SpO2 98%   BMI 27.12 kg/m  Vitals with BMI 12/08/2019 11/16/2019 09/29/2019  Height 5' 4"  5' 5"  5' 4"   Weight 158 lbs 153 lbs 158 lbs  BMI 27.11 37.95 58.31  Systolic 90 674 255  Diastolic 68 72 60  Pulse 80 97 60     Physical Exam  [name].  She has gained about 5 pounds since last visit.  No other new physical findings.     Assessment   1. Vitamin D deficiency disease   2. Decreased libido   3. Malaise and fatigue       Tests ordered Orders Placed This Encounter  Procedures  . CMP with eGFR([facility])  . Testos,Total,Free and SHBG (Female)  . T3, Free  . TSH     Plan: 1. Blood work is ordered as above. 2. Further recommendations will depend on blood results and I will see her in about 4 months for follow-up. 3. Today I spent 30 minutes with this patient face-to-face, more than 50% of the time was involved in answering several questions she had regarding why she feels tired and strategies in which she can improve this.   No orders of the defined types were placed in this encounter.   Doree Albee, MD

## 2019-12-12 DIAGNOSIS — M542 Cervicalgia: Secondary | ICD-10-CM | POA: Diagnosis not present

## 2019-12-12 DIAGNOSIS — M546 Pain in thoracic spine: Secondary | ICD-10-CM | POA: Diagnosis not present

## 2019-12-12 DIAGNOSIS — M9901 Segmental and somatic dysfunction of cervical region: Secondary | ICD-10-CM | POA: Diagnosis not present

## 2019-12-12 DIAGNOSIS — M9902 Segmental and somatic dysfunction of thoracic region: Secondary | ICD-10-CM | POA: Diagnosis not present

## 2019-12-13 LAB — COMPLETE METABOLIC PANEL WITH GFR
AG Ratio: 1.9 (calc) (ref 1.0–2.5)
ALT: 18 U/L (ref 6–29)
AST: 26 U/L (ref 10–30)
Albumin: 4.4 g/dL (ref 3.6–5.1)
Alkaline phosphatase (APISO): 42 U/L (ref 31–125)
BUN: 13 mg/dL (ref 7–25)
CO2: 28 mmol/L (ref 20–32)
Calcium: 9.2 mg/dL (ref 8.6–10.2)
Chloride: 102 mmol/L (ref 98–110)
Creat: 0.67 mg/dL (ref 0.50–1.10)
GFR, Est African American: 125 mL/min/{1.73_m2} (ref >=60)
GFR, Est Non African American: 108 mL/min/{1.73_m2} (ref >=60)
Globulin: 2.3 g/dL (calc) (ref 1.9–3.7)
Glucose, Bld: 85 mg/dL (ref 65–99)
Potassium: 4.4 mmol/L (ref 3.5–5.3)
Sodium: 136 mmol/L (ref 135–146)
Total Bilirubin: 0.4 mg/dL (ref 0.2–1.2)
Total Protein: 6.7 g/dL (ref 6.1–8.1)

## 2019-12-13 LAB — TESTOS,TOTAL,FREE AND SHBG (FEMALE)
Free Testosterone: 4.9 pg/mL (ref 0.1–6.4)
Sex Hormone Binding: 52 nmol/L (ref 17–124)
Testosterone, Total, LC-MS-MS: 43 ng/dL (ref 2–45)

## 2019-12-13 LAB — T3, FREE: T3, Free: 3.7 pg/mL (ref 2.3–4.2)

## 2019-12-13 LAB — TSH: TSH: 0.1 mIU/L — ABNORMAL LOW

## 2019-12-17 ENCOUNTER — Ambulatory Visit
Admission: EM | Admit: 2019-12-17 | Discharge: 2019-12-17 | Disposition: A | Discharge status: Home / Self Care | Payer: BC Managed Care – PPO | Attending: Emergency Medicine | Admitting: Emergency Medicine

## 2019-12-17 ENCOUNTER — Other Ambulatory Visit: Payer: Self-pay

## 2019-12-17 DIAGNOSIS — J029 Acute pharyngitis, unspecified: Secondary | ICD-10-CM | POA: Diagnosis not present

## 2019-12-17 DIAGNOSIS — R5383 Other fatigue: Secondary | ICD-10-CM | POA: Diagnosis not present

## 2019-12-17 DIAGNOSIS — R0789 Other chest pain: Secondary | ICD-10-CM

## 2019-12-17 DIAGNOSIS — Z20822 Contact with and (suspected) exposure to covid-19: Secondary | ICD-10-CM | POA: Diagnosis not present

## 2019-12-17 LAB — POCT MONO SCREEN (KUC): Mono, POC: NEGATIVE

## 2019-12-17 MED ORDER — FLUTICASONE PROPIONATE 50 MCG/ACT NA SUSP: 1.0000 | Freq: Every day | NASAL | 0 refills | Status: DC

## 2019-12-17 NOTE — Discharge Instructions (Signed)

## 2019-12-17 NOTE — ED Provider Notes (Signed)
[facility]    CSN: [id number] Arrival date & time: 12/17/19  1224      History   Chief Complaint Chief Complaint  Patient presents with  . recurrent sore throat    HPI Kara Anderson is a 44 y.o. female.   Kara Anderson 44 years old female presented to the urgent care for complaint of recurrent sore throat, fatigue, chest tightness, and rib cage pain for the past 2 to 3 days.  Reports she was treated for strep with amoxicillin, with negative strep culture and strep test.  Reports she was also prescribed prednisone course was completed.  Denies sick exposure to  flu or strep.  Denies recent travel.  Denies aggravating or alleviating symptoms.  Denies previous COVID infection.   Denies fever, chills,  nasal congestion, rhinorrhea,  cough, SOB, wheezing, chest pain, nausea, vomiting, changes in bowel or bladder habits.    The history is provided by the patient. No language interpreter was used.    Past Medical History:  Diagnosis Date  . Decreased libido 09/29/2019  . Endometriosis 2007,2009  . HLD (hyperlipidemia) 09/29/2019  . Malaise and fatigue 09/29/2019  . Mediastinal adenopathy   . Mononucleosis   . Seasonal allergies   . Strep throat   . Viral meningitis   . Vitamin D deficiency disease 09/29/2019    Patient Active Problem List   Diagnosis Date Noted  . HLD (hyperlipidemia) 09/29/2019  . Vitamin D deficiency disease 09/29/2019  . Malaise and fatigue 09/29/2019  . Decreased libido 09/29/2019  . Takayasu's arteritis ([place]) 10/14/2018  . Vasculitis ([place]) 12/19/2017  . Mediastinal mass 11/04/2017  . Pulmonary stenosis 11/04/2017  . Bruit 10/31/2017  . Murmur 10/31/2017  . Breast mass, right 10/29/2015    Past Surgical History:  Procedure Laterality Date  . HIP SURGERY  2013  . IR THORACENTESIS ASP PLEURAL SPACE W/IMG GUIDE  11/20/2017  . LAPAROSCOPIC ABDOMINAL EXPLORATION  2007,2009   endometriosis  . MEDIASTINOTOMY CHAMBERLAIN MCNEIL Left  11/16/2017   Procedure: LEFT ANTERIOR MEDIASTINOTOMY CHAMBERLAIN PROCEDURE;  Surgeon: [name], MD;  Location: [facility];  Service: Thoracic;  Laterality: Left;  . NOSE SURGERY  2007    OB History    Gravida  0   Para  0   Term  0   Preterm  0   AB  0   Living  0     SAB  0   TAB  0   Ectopic  0   Multiple  0   Live Births           Obstetric Comments  1st Menstrual Cycle:  12          Home Medications    Prior to Admission medications   Medication Sig Start Date End Date Taking? Authorizing Provider  aspirin EC 81 MG tablet Take 81 mg by mouth daily.    [provider]  Cholecalciferol (VITAMIN D-3) 125 MCG (5000 UT) TABS Take 10,000 Units by mouth every other day.     [provider]  fluticasone (FLONASE) 50 MCG/ACT nasal spray Place 1 spray into both nostrils daily for 14 days. 12/17/19 12/31/19  Adonay Scheier, Darrelyn Hillock, FNP  isosorbide mononitrate (IMDUR) 60 MG 24 hr tablet Take 1 tablet by mouth daily.  08/25/19 08/24/20  [provider]  loratadine (CLARITIN) 10 MG tablet Take 10 mg by mouth daily.    [provider]  montelukast (SINGULAIR) 10 MG tablet TAKE 1 TABLET BY MOUTH  DAILY 10/12/19   Ailene Ards, NP  mycophenolate (CELLCEPT) 500 MG tablet Take 1,500 mg by mouth 2 (two) times a day. 1500 in the morning and 1000 at night 01/13/19 01/13/20  [provider]  NP THYROID 120 MG tablet Take 120 mg by mouth daily before breakfast.    [provider]  Testosterone 20 % CREA Apply 10 mg topically daily.    [provider]  vitamin B-12 (CYANOCOBALAMIN) 1000 MCG tablet Take 3,000 mcg by mouth daily.    [provider]    Family History Family History  Problem Relation Age of Onset  . Heart disease Mother   . Stroke Mother   . Diabetes Mother   . Heart disease Father   . Alcohol abuse Father     Social History Social History   Tobacco Use  . Smoking status: Never Smoker  .  Smokeless tobacco: Never Used  Substance Use Topics  . Alcohol use: No    Alcohol/week: 0.0 standard drinks  . Drug use: No     Allergies   Codeine   Review of Systems Review of Systems  Constitutional: Positive for fatigue.  HENT: Positive for sore throat.   Respiratory: Negative.        Rib cage pain  Cardiovascular: Negative.   Gastrointestinal: Negative.   Neurological: Negative.      Physical Exam Triage Vital Signs ED Triage Vitals  Enc Vitals Group     BP 12/17/19 1337 110/72     Pulse Rate 12/17/19 1337 76     Resp 12/17/19 1318 16     Temp 12/17/19 1337 98.3 F (36.8 C)     Temp Source 12/17/19 1318 Oral     SpO2 12/17/19 1337 97 %     Weight --      Height --      Head Circumference --      Peak Flow --      Pain Score 12/17/19 1327 0     Pain Loc --      Pain Edu? --      Excl. in [place]? --    No data found.  Updated Vital Signs BP 110/72 (BP Location: Right Arm)   Pulse 76   Temp 98.3 F (36.8 C) (Oral)   Resp 16   LMP 12/09/2019   SpO2 97%   Visual Acuity Right Eye Distance:   Left Eye Distance:   Bilateral Distance:    Right Eye Near:   Left Eye Near:    Bilateral Near:     Physical Exam Vitals and nursing note reviewed.  Constitutional:      General: She is not in acute distress.    Appearance: Normal appearance. She is normal weight. She is not ill-appearing or toxic-appearing.  HENT:     Head: Normocephalic.     Right Ear: Tympanic membrane, ear canal and external ear normal. There is no impacted cerumen.     Left Ear: Ear canal and external ear normal. There is no impacted cerumen.     Nose: Nose normal. No congestion.     Mouth/Throat:     Mouth: Mucous membranes are moist.     Pharynx: No oropharyngeal exudate or posterior oropharyngeal erythema.  Cardiovascular:     Rate and Rhythm: Normal rate and regular rhythm.     Pulses: Normal pulses.     Heart sounds: Normal heart sounds. No murmur.  Pulmonary:     Effort:  Pulmonary effort is  normal. No respiratory distress.     Breath sounds: No wheezing or rhonchi.  Chest:     Chest wall: No tenderness.  Abdominal:     General: Abdomen is flat. Bowel sounds are normal. There is no distension.     Palpations: There is no mass.  Skin:    Capillary Refill: Capillary refill takes less than 2 seconds.  Neurological:     Mental Status: She is alert and oriented to person, place, and time.      UC Treatments / Results  Labs (all labs ordered are listed, but only abnormal results are displayed) Labs Reviewed  NOVEL CORONAVIRUS, NAA  POCT MONO SCREEN ([facility])    EKG   Radiology No results found.  Procedures Procedures (including critical care time)  Medications Ordered in UC Medications - No data to display  Initial Impression / Assessment and Plan / UC Course  I have reviewed the triage vital signs and the nursing notes.  Pertinent labs & imaging results that were available during my care of the patient were reviewed by me and considered in my medical decision making (see chart for details).     Mono test was ordered and results reviewed.  Result is negative.  Patient is  stable for discharge.  Advised patient to quarantine until COVID-19 test result become available.  To return for worsening of symptoms.  Patient verbalized understanding of the plan of care. Final Clinical Impressions(s) / UC Diagnoses   Final diagnoses:  Suspected COVID-19 virus infection  COVID-19 ruled out     Discharge Instructions     COVID testing ordered.  It will take between 2-7 days for test results.  Someone will contact you regarding abnormal results.    In the meantime: You should remain isolated in your home for 10 days from symptom onset  Get plenty of rest and push fluids Use medications daily for symptom relief Use OTC medications like ibuprofen or tylenol as needed fever or pain Call or go to the ED if you have any new or worsening symptoms such as  fever, worsening cough, shortness of breath, chest tightness, chest pain, turning blue, changes in mental status, etc...     ED Prescriptions    Medication Sig Dispense Auth. Provider   fluticasone (FLONASE) 50 MCG/ACT nasal spray Place 1 spray into both nostrils daily for 14 days. 16 g Emerson Monte, FNP     PDMP not reviewed this encounter.   Emerson Monte, FNP 12/17/19 1440

## 2019-12-17 NOTE — ED Triage Notes (Addendum)
Pt presents to UC w/ c/o swollen tonsils x6 weeks. Strep was negative. Took antibiotics. Pt states she is immunocompromised. Pt c/o ear ache and sore throat since 3 days ago, stiff neck x5-7 days Pt c/o sob w/ exertion and fatigue. Pt c/o chest pain, achy rib cage. Pt denies fevers but states she normally doesn't run fevers. Pt states she has slight productive cough. Pt was seen by Dr. Anastasio Champion who states she may need xray and be "reevaluated to see if anything else is going on."

## 2019-12-19 LAB — NOVEL CORONAVIRUS, NAA: SARS-CoV-2, NAA: NOT DETECTED

## 2019-12-20 ENCOUNTER — Other Ambulatory Visit (INDEPENDENT_AMBULATORY_CARE_PROVIDER_SITE_OTHER): Payer: Self-pay | Admitting: Internal Medicine

## 2019-12-20 DIAGNOSIS — J029 Acute pharyngitis, unspecified: Secondary | ICD-10-CM

## 2019-12-20 MED ORDER — TESTOSTERONE 20 % CREA: 15.0000 mg | TOPICAL_CREAM | Freq: Every day | 1 refills | Status: DC

## 2019-12-26 ENCOUNTER — Other Ambulatory Visit (INDEPENDENT_AMBULATORY_CARE_PROVIDER_SITE_OTHER): Payer: Self-pay

## 2019-12-27 ENCOUNTER — Encounter (INDEPENDENT_AMBULATORY_CARE_PROVIDER_SITE_OTHER): Payer: Self-pay | Admitting: Internal Medicine

## 2019-12-27 DIAGNOSIS — J340 Abscess, furuncle and carbuncle of nose: Secondary | ICD-10-CM | POA: Diagnosis not present

## 2019-12-27 DIAGNOSIS — J301 Allergic rhinitis due to pollen: Secondary | ICD-10-CM | POA: Diagnosis not present

## 2019-12-27 DIAGNOSIS — R070 Pain in throat: Secondary | ICD-10-CM | POA: Diagnosis not present

## 2019-12-27 DIAGNOSIS — J309 Allergic rhinitis, unspecified: Secondary | ICD-10-CM | POA: Diagnosis not present

## 2019-12-28 ENCOUNTER — Telehealth (INDEPENDENT_AMBULATORY_CARE_PROVIDER_SITE_OTHER): Payer: Self-pay

## 2019-12-29 ENCOUNTER — Other Ambulatory Visit (INDEPENDENT_AMBULATORY_CARE_PROVIDER_SITE_OTHER): Payer: Self-pay

## 2019-12-29 DIAGNOSIS — N390 Urinary tract infection, site not specified: Secondary | ICD-10-CM

## 2019-12-29 LAB — POST ANALYTICAL NAME DISCREPANCY

## 2019-12-29 LAB — URINE CULTURE: Result:: NO GROWTH

## 2019-12-29 NOTE — Telephone Encounter (Signed)
CANCELED REQUEST PER PATIENT FOR REFERRAL.

## 2020-01-17 ENCOUNTER — Other Ambulatory Visit: Payer: Self-pay

## 2020-01-17 ENCOUNTER — Ambulatory Visit (INDEPENDENT_AMBULATORY_CARE_PROVIDER_SITE_OTHER): Payer: BC Managed Care – PPO | Admitting: Nurse Practitioner

## 2020-01-17 ENCOUNTER — Encounter (INDEPENDENT_AMBULATORY_CARE_PROVIDER_SITE_OTHER): Payer: Self-pay | Admitting: Nurse Practitioner

## 2020-01-17 VITALS — BP 110/66 | HR 70 | Temp 97.1°F | Resp 18 | Ht 64.0 in | Wt 153.0 lb

## 2020-01-17 DIAGNOSIS — R0602 Shortness of breath: Secondary | ICD-10-CM

## 2020-01-17 NOTE — Patient Instructions (Signed)
Thank you for choosing [facility] as your medical provider! If you have any questions or concerns regarding your health care, please do not hesitate to call our office.  You came in today for complaints of progressive shortness of breath and intermittent chest pain.  EKG performed today was normal.  We are awaiting CBC results. Lung fields were clear throughout on exam.  No wheezing, crackles, rhonchi noted. Heart murmur noted on exam. As we discussed in the office call both your rheumatologist and cardiologist to let them know how you are feeling as well as what we accomplish in your office visit today.  Please let me know if you would like to undergo chest x-ray, for further evaluation.  Also, seriously consider being tested again for Covid19.  Please follow-up as scheduled in 4 months, or sooner if needed. We look forward to seeing you again soon!   At [facility] we value your feedback. You may receive a survey about your visit today. Please share your experience as we strive to create trusting relationships with our patients to provide genuine, compassionate, quality care.  We appreciate your understanding and patience as we review any laboratory studies, imaging, and other diagnostic tests that are ordered as we care for you. We do our best to address any and all results in a timely manner. If you do not hear about test results within 1 week, please do not hesitate to contact us. If we referred you to a specialist during your visit or ordered imaging testing, contact the office if you have not been contacted to be scheduled within 1 weeks.  We also encourage the use of MyChart, which contains your medical information for your review as well. If you are not enrolled in this feature, an access code is on this after visit summary for your convenience. Thank you for allowing [place] to be involved in your care.

## 2020-01-17 NOTE — Assessment & Plan Note (Signed)
On exam lung sounds are clear.  I do note that she does have a cardiac murmur again.  This had resolved in the past when she was treated with steroids and CellCept, but seems to have returned.  EKG performed which was normal sinus rhythm.  We will collect CBC with differential for further evaluation for possible infection.  I did encourage her to be tested for COVID-19, but she does not seem willing to undergo testing for this at this time.  I also encouraged her to reach out to her cardiologist and rheumatologist and discuss her symptoms with them as well.  We did discuss sending for chest x-ray for further evaluation, but she would like to hold off on getting a chest x-ray until she talks to her cardiologist and rheumatologist.  Will await results of her blood work and discuss this further once she has discussed her concerns with cardiologist and rheumatologist.

## 2020-01-17 NOTE — Progress Notes (Signed)
Subjective:  Patient ID: Kara Anderson, female    DOB: 11-Nov-1976  Age: 44 y.o. MRN: [id number]  CC:  Chief Complaint  Patient presents with  . Shortness of Breath      HPI  This patient arrives for an acute visit for the above.  She tells me that she started getting some shortness of breath around mid to late December (approximately 6 weeks ago).  She tells me this is been getting increasingly worse.  Her shortness of breath occurs with activity as well as at rest when talking.  In November (approximately 1 month before she started experiencing worsening shortness of breath) her Imdur was increased to 60 mg daily.  She tells me that she felt very good physically when her Imdur was increased.  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  She also mentions that her rheumatologist (following her and treating her for probable Takayasu's arteritis; apparently her clinical presentation has made it questionable whether or not this is an accurate diagnosis, but she tells me that her doctor is treating her for this because her presentation seems to most closely align with Takayasu's arteritis) reduced her CellCept from 1500 mg twice a day down to 1500 mg in the a.m. and 1000 mg in the p.m. at the beginning of December.  She also mentions that around the time she started experiencing worsening shortness of breath she was having throat pain and swollen tonsils.  She was tested for strep and mono, both of which came back negative.  She also underwent testing for Covid19 in early January which was negative.  She also expresses intermittent chest pain that occurs with deep breathing and shortness of breath.  For the last 2 months she has had cough in addition to her shortness of breath, at times it is productive.  She denies any recent fevers, orthopnea, paroxysmal nocturnal dyspnea, wheezing, lower extremity swelling, or known  exposures to [place].  She is concerned about the etiology of her shortness of breath.  Today she would like to rule out lung involvement.  She tells me she is already called her cardiologist at [facility] and is awaiting a return phone call.  She tells me she is scheduled to see her rheumatologist again in June and her cardiologist in November.   Past Medical History:  Diagnosis Date  . Decreased libido 09/29/2019  . Endometriosis 2007,2009  . HLD (hyperlipidemia) 09/29/2019  . Malaise and fatigue 09/29/2019  . Mediastinal adenopathy   . Mononucleosis   . Seasonal allergies   . Strep throat   . Viral meningitis   . Vitamin D deficiency disease 09/29/2019      Family History  Problem Relation Age of Onset  . Heart disease Mother   . Stroke Mother   . Diabetes Mother   . Heart disease Father   . 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   Social History   Tobacco Use  . Smoking status: Never Smoker  . Smokeless tobacco: Never Used  Substance Use Topics  . Alcohol use: No    Alcohol/week: 0.0 standard drinks     Current Meds  Medication Sig  . aspirin EC 81 MG tablet Take 81 mg by mouth daily.  . Cholecalciferol (VITAMIN D-3) 125 MCG (5000 UT) TABS  Take 10,000 Units by mouth every other day.   . isosorbide mononitrate (IMDUR) 60 MG 24 hr tablet Take 1 tablet by mouth daily.   . NP THYROID 120 MG tablet TAKE 1 TABLET BY MOUTH DAILY  . Testosterone 20 % CREA Apply 15 mg topically daily.  . vitamin B-12 (CYANOCOBALAMIN) 1000 MCG tablet Take 3,000 mcg by mouth daily.    ROS:  Review of Systems  Constitutional: Positive for malaise/fatigue. Negative for fever.  Eyes: Negative for blurred vision.  Respiratory: Positive for cough and sputum production. Negative for wheezing.   Cardiovascular: Positive for chest pain. Negative for palpitations, orthopnea, leg  swelling and PND.  Neurological: Negative for dizziness and headaches.     Objective:   Today's Vitals: BP 110/66 (BP Location: Right Arm, Patient Position: Sitting, Cuff Size: Normal)   Pulse 70   Temp (!) 97.1 F (36.2 C) (Temporal)   Resp 18   Ht 5\' 4"  (1.626 m)   Wt 153 lb (69.4 kg)   SpO2 98%   BMI 26.26 kg/m  Vitals with BMI 01/17/2020 12/17/2019 12/08/2019  Height 5\' 4"  - 5\' 4"   Weight 153 lbs - 158 lbs  BMI A999333 - 0000000  Systolic A999333 A999333 90  Diastolic 66 72 68  Pulse 70 76 80     Physical Exam Vitals reviewed.  Constitutional:      General: She is not in acute distress.    Appearance: Normal appearance.  HENT:     Head: Normocephalic and atraumatic.  Neck:     Vascular: No carotid bruit.  Cardiovascular:     Rate and Rhythm: Normal rate and regular rhythm.     Chest Wall: PMI is not displaced.     Pulses: Normal pulses.     Heart sounds: Murmur present. Systolic murmur present with a grade of 2/6. No friction rub. No gallop. No S3 or S4 sounds.   Pulmonary:     Effort: Pulmonary effort is normal. No tachypnea, accessory muscle usage or respiratory distress.     Breath sounds: Normal breath sounds. No decreased breath sounds, wheezing, rhonchi or rales.  Musculoskeletal:     Right lower leg: No edema.     Left lower leg: No edema.  Skin:    General: Skin is warm and dry.  Neurological:     General: No focal deficit present.     Mental Status: She is alert and oriented to person, place, and time.  Psychiatric:        Mood and Affect: Mood normal.        Behavior: Behavior normal.        Judgment: Judgment normal.     EKG:  Normal sinus rhythm     Assessment   No diagnosis found.    Tests ordered No orders of the defined types were placed in this encounter.    Plan: Please see assessment and plan per problem list below.   No orders of the defined types were placed in this encounter.  Encounter today lasted greater than 60 minutes.   This time was spent face-to-face with patient discussing her concerns, symptoms, timeline of her symptoms, recommendations regarding diagnostics, and shared decision making regarding next steps.   Ailene Ards, NP

## 2020-01-18 ENCOUNTER — Encounter (INDEPENDENT_AMBULATORY_CARE_PROVIDER_SITE_OTHER): Payer: Self-pay | Admitting: Nurse Practitioner

## 2020-01-18 ENCOUNTER — Other Ambulatory Visit (INDEPENDENT_AMBULATORY_CARE_PROVIDER_SITE_OTHER): Payer: Self-pay | Admitting: Nurse Practitioner

## 2020-01-18 DIAGNOSIS — R011 Cardiac murmur, unspecified: Secondary | ICD-10-CM

## 2020-01-18 DIAGNOSIS — R0602 Shortness of breath: Secondary | ICD-10-CM

## 2020-01-18 LAB — CBC WITH DIFFERENTIAL/PLATELET
Absolute Monocytes: 466 cells/uL (ref 200–950)
Basophils Absolute: 37 cells/uL (ref 0–200)
Basophils Relative: 0.5 %
Eosinophils Absolute: 74 cells/uL (ref 15–500)
Eosinophils Relative: 1 %
HCT: 37.4 % (ref 35.0–45.0)
Hemoglobin: 13 g/dL (ref 11.7–15.5)
Lymphs Abs: 1939 cells/uL (ref 850–3900)
MCH: 31.3 pg (ref 27.0–33.0)
MCHC: 34.8 g/dL (ref 32.0–36.0)
MCV: 90.1 fL (ref 80.0–100.0)
MPV: 10.7 fL (ref 7.5–12.5)
Monocytes Relative: 6.3 %
Neutro Abs: 4884 cells/uL (ref 1500–7800)
Neutrophils Relative %: 66 %
Platelets: 303 10*3/uL (ref 140–400)
RBC: 4.15 10*6/uL (ref 3.80–5.10)
RDW: 11.9 % (ref 11.0–15.0)
Total Lymphocyte: 26.2 %
WBC: 7.4 10*3/uL (ref 3.8–10.8)

## 2020-01-18 LAB — EXTRA BLUE TOP TUBE

## 2020-01-18 NOTE — Progress Notes (Signed)
Imaging orders

## 2020-01-18 NOTE — Telephone Encounter (Signed)
Patient requested that I give her a call.  I did call her and she was helping that we could try to get her scheduled at either [facility] radiology or [facility] imaging.  She would just like to get in as soon as possible to get these completed.  We will get her scheduled as soon as possible.

## 2020-01-19 ENCOUNTER — Other Ambulatory Visit (INDEPENDENT_AMBULATORY_CARE_PROVIDER_SITE_OTHER): Payer: Self-pay | Admitting: Nurse Practitioner

## 2020-01-19 ENCOUNTER — Other Ambulatory Visit: Payer: Self-pay

## 2020-01-19 ENCOUNTER — Ambulatory Visit
Admission: RE | Admit: 2020-01-19 | Discharge: 2020-01-19 | Disposition: A | Discharge status: Home / Self Care | Payer: BC Managed Care – PPO | Source: Ambulatory Visit | Attending: Nurse Practitioner | Admitting: Nurse Practitioner

## 2020-01-19 ENCOUNTER — Ambulatory Visit (HOSPITAL_COMMUNITY): Payer: BC Managed Care – PPO | Attending: Cardiology

## 2020-01-19 DIAGNOSIS — R011 Cardiac murmur, unspecified: Secondary | ICD-10-CM | POA: Diagnosis not present

## 2020-01-19 DIAGNOSIS — R0602 Shortness of breath: Secondary | ICD-10-CM

## 2020-01-20 ENCOUNTER — Encounter (INDEPENDENT_AMBULATORY_CARE_PROVIDER_SITE_OTHER): Payer: Self-pay | Admitting: Nurse Practitioner

## 2020-01-23 DIAGNOSIS — I776 Arteritis, unspecified: Secondary | ICD-10-CM | POA: Diagnosis not present

## 2020-01-23 DIAGNOSIS — M9902 Segmental and somatic dysfunction of thoracic region: Secondary | ICD-10-CM | POA: Diagnosis not present

## 2020-01-23 DIAGNOSIS — M9903 Segmental and somatic dysfunction of lumbar region: Secondary | ICD-10-CM | POA: Diagnosis not present

## 2020-01-23 DIAGNOSIS — M545 Low back pain: Secondary | ICD-10-CM | POA: Diagnosis not present

## 2020-01-23 DIAGNOSIS — M546 Pain in thoracic spine: Secondary | ICD-10-CM | POA: Diagnosis not present

## 2020-01-25 DIAGNOSIS — Z79899 Other long term (current) drug therapy: Secondary | ICD-10-CM | POA: Diagnosis not present

## 2020-01-25 DIAGNOSIS — R0600 Dyspnea, unspecified: Secondary | ICD-10-CM | POA: Diagnosis not present

## 2020-01-25 DIAGNOSIS — M2550 Pain in unspecified joint: Secondary | ICD-10-CM | POA: Diagnosis not present

## 2020-01-25 DIAGNOSIS — I776 Arteritis, unspecified: Secondary | ICD-10-CM | POA: Diagnosis not present

## 2020-01-30 DIAGNOSIS — L821 Other seborrheic keratosis: Secondary | ICD-10-CM | POA: Diagnosis not present

## 2020-01-30 DIAGNOSIS — L570 Actinic keratosis: Secondary | ICD-10-CM | POA: Diagnosis not present

## 2020-01-30 DIAGNOSIS — J340 Abscess, furuncle and carbuncle of nose: Secondary | ICD-10-CM | POA: Diagnosis not present

## 2020-01-30 DIAGNOSIS — R070 Pain in throat: Secondary | ICD-10-CM | POA: Diagnosis not present

## 2020-01-30 DIAGNOSIS — J309 Allergic rhinitis, unspecified: Secondary | ICD-10-CM | POA: Diagnosis not present

## 2020-01-30 DIAGNOSIS — D485 Neoplasm of uncertain behavior of skin: Secondary | ICD-10-CM | POA: Diagnosis not present

## 2020-01-30 DIAGNOSIS — L814 Other melanin hyperpigmentation: Secondary | ICD-10-CM | POA: Diagnosis not present

## 2020-02-16 DIAGNOSIS — Z872 Personal history of diseases of the skin and subcutaneous tissue: Secondary | ICD-10-CM | POA: Diagnosis not present

## 2020-02-16 DIAGNOSIS — Z86018 Personal history of other benign neoplasm: Secondary | ICD-10-CM | POA: Diagnosis not present

## 2020-02-16 DIAGNOSIS — L578 Other skin changes due to chronic exposure to nonionizing radiation: Secondary | ICD-10-CM | POA: Diagnosis not present

## 2020-02-16 DIAGNOSIS — L910 Hypertrophic scar: Secondary | ICD-10-CM | POA: Diagnosis not present

## 2020-02-16 DIAGNOSIS — D485 Neoplasm of uncertain behavior of skin: Secondary | ICD-10-CM | POA: Diagnosis not present

## 2020-02-27 DIAGNOSIS — M545 Low back pain: Secondary | ICD-10-CM | POA: Diagnosis not present

## 2020-02-27 DIAGNOSIS — M9902 Segmental and somatic dysfunction of thoracic region: Secondary | ICD-10-CM | POA: Diagnosis not present

## 2020-02-27 DIAGNOSIS — M9903 Segmental and somatic dysfunction of lumbar region: Secondary | ICD-10-CM | POA: Diagnosis not present

## 2020-02-27 DIAGNOSIS — M546 Pain in thoracic spine: Secondary | ICD-10-CM | POA: Diagnosis not present

## 2020-03-02 DIAGNOSIS — I776 Arteritis, unspecified: Secondary | ICD-10-CM | POA: Diagnosis not present

## 2020-03-02 DIAGNOSIS — J9859 Other diseases of mediastinum, not elsewhere classified: Secondary | ICD-10-CM | POA: Diagnosis not present

## 2020-03-12 DIAGNOSIS — J340 Abscess, furuncle and carbuncle of nose: Secondary | ICD-10-CM | POA: Diagnosis not present

## 2020-03-14 DIAGNOSIS — M546 Pain in thoracic spine: Secondary | ICD-10-CM | POA: Diagnosis not present

## 2020-03-14 DIAGNOSIS — M9903 Segmental and somatic dysfunction of lumbar region: Secondary | ICD-10-CM | POA: Diagnosis not present

## 2020-03-14 DIAGNOSIS — M9902 Segmental and somatic dysfunction of thoracic region: Secondary | ICD-10-CM | POA: Diagnosis not present

## 2020-03-14 DIAGNOSIS — M545 Low back pain: Secondary | ICD-10-CM | POA: Diagnosis not present

## 2020-03-19 ENCOUNTER — Other Ambulatory Visit (INDEPENDENT_AMBULATORY_CARE_PROVIDER_SITE_OTHER): Payer: Self-pay

## 2020-03-19 MED ORDER — TESTOSTERONE 20 % CREA: 15.0000 mg | TOPICAL_CREAM | Freq: Every day | 1 refills | Status: DC

## 2020-03-20 DIAGNOSIS — I776 Arteritis, unspecified: Secondary | ICD-10-CM | POA: Diagnosis not present

## 2020-03-20 DIAGNOSIS — R0600 Dyspnea, unspecified: Secondary | ICD-10-CM | POA: Diagnosis not present

## 2020-03-20 DIAGNOSIS — M1990 Unspecified osteoarthritis, unspecified site: Secondary | ICD-10-CM | POA: Diagnosis not present

## 2020-03-20 DIAGNOSIS — Z79899 Other long term (current) drug therapy: Secondary | ICD-10-CM | POA: Diagnosis not present

## 2020-03-26 DIAGNOSIS — M9902 Segmental and somatic dysfunction of thoracic region: Secondary | ICD-10-CM | POA: Diagnosis not present

## 2020-03-26 DIAGNOSIS — M545 Low back pain: Secondary | ICD-10-CM | POA: Diagnosis not present

## 2020-03-26 DIAGNOSIS — M9903 Segmental and somatic dysfunction of lumbar region: Secondary | ICD-10-CM | POA: Diagnosis not present

## 2020-03-26 DIAGNOSIS — M546 Pain in thoracic spine: Secondary | ICD-10-CM | POA: Diagnosis not present

## 2020-04-09 ENCOUNTER — Telehealth (INDEPENDENT_AMBULATORY_CARE_PROVIDER_SITE_OTHER): Payer: Self-pay

## 2020-04-09 ENCOUNTER — Encounter (INDEPENDENT_AMBULATORY_CARE_PROVIDER_SITE_OTHER): Payer: Self-pay | Admitting: Internal Medicine

## 2020-04-09 ENCOUNTER — Other Ambulatory Visit: Payer: Self-pay

## 2020-04-09 ENCOUNTER — Other Ambulatory Visit (INDEPENDENT_AMBULATORY_CARE_PROVIDER_SITE_OTHER): Payer: Self-pay | Admitting: Internal Medicine

## 2020-04-09 ENCOUNTER — Ambulatory Visit (INDEPENDENT_AMBULATORY_CARE_PROVIDER_SITE_OTHER): Payer: BC Managed Care – PPO | Admitting: Internal Medicine

## 2020-04-09 VITALS — BP 100/70 | HR 81 | Temp 98.1°F | Ht 64.0 in | Wt 154.6 lb

## 2020-04-09 DIAGNOSIS — R5383 Other fatigue: Secondary | ICD-10-CM

## 2020-04-09 DIAGNOSIS — R5381 Other malaise: Secondary | ICD-10-CM | POA: Diagnosis not present

## 2020-04-09 DIAGNOSIS — R0602 Shortness of breath: Secondary | ICD-10-CM | POA: Diagnosis not present

## 2020-04-09 DIAGNOSIS — R6882 Decreased libido: Secondary | ICD-10-CM | POA: Diagnosis not present

## 2020-04-09 DIAGNOSIS — E2839 Other primary ovarian failure: Secondary | ICD-10-CM | POA: Diagnosis not present

## 2020-04-09 MED ORDER — CELECOXIB 200 MG PO CAPS: 200.0000 mg | ORAL_CAPSULE | Freq: Two times a day (BID) | ORAL | 2 refills | Status: DC

## 2020-04-09 NOTE — Progress Notes (Signed)
Metrics: Intervention Frequency ACO  Documented Smoking Status Yearly  Screened one or more times in 24 months  Cessation Counseling or  Active cessation medication Past 24 months  Past 24 months   Guideline developer: UpToDate (See UpToDate for funding source) Date Released: 2014       Wellness Office Visit  Subjective:  Patient ID: Kara Anderson, female    DOB: [birth year]  Age: 44 y.o. MRN: [id number]  CC: This lady comes in for follow-up regarding her malaise and fatigue, symptoms of thyroid deficiency. HPI She has been seen by rheumatology and hydroxychloroquine has now been added to the medications.  She is frustrated that she continues to have joint pains although there is no significant swelling. She is wondering regarding the long-term effects of taking NP thyroid.  She wants to continue to take testosterone therapy. She also is frustrated that she feels very fatigued/nauseous after what she describes as fairly minimal exertion.  Past Medical History:  Diagnosis Date  . Decreased libido 09/29/2019  . Endometriosis 2007,2009  . HLD (hyperlipidemia) 09/29/2019  . Malaise and fatigue 09/29/2019  . Mediastinal adenopathy   . Mononucleosis   . Seasonal allergies   . Strep throat   . Viral meningitis   . Vitamin D deficiency disease 09/29/2019      Family History  Problem Relation Age of Onset  . Heart disease Mother   . Stroke Mother   . Diabetes Mother   . Heart disease Father   . 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   Social History   Tobacco Use  . Smoking status: Never Smoker  . Smokeless tobacco: Never Used  Substance Use Topics  . Alcohol use: No    Alcohol/week: 0.0 standard drinks    Current Meds  Medication Sig  . aspirin EC 81 MG tablet Take 81 mg by mouth daily.  . celecoxib (CELEBREX) 100 MG capsule Take 100 mg by mouth 2  (two) times daily.  . Cholecalciferol (VITAMIN D-3) 125 MCG (5000 UT) TABS Take 10,000 Units by mouth every other day.   Marland Kitchen HYDROXYCHLOROQUINE SULFATE PO Take 300 mg by mouth daily.  . isosorbide mononitrate (IMDUR) 60 MG 24 hr tablet Take 1 tablet by mouth daily.   . mycophenolate (CELLCEPT) 500 MG tablet Take by mouth 2 (two) times daily. 1500 in the AM and 1000 in the PM  . NP THYROID 120 MG tablet TAKE 1 TABLET BY MOUTH DAILY  . predniSONE (DELTASONE) 5 MG tablet Take 5 mg by mouth daily with breakfast.  . Testosterone 20 % CREA Apply 15 mg topically daily.  . vitamin B-12 (CYANOCOBALAMIN) 1000 MCG tablet Take 3,000 mcg by mouth daily.       Objective:   Today's Vitals: BP 100/70 (BP Location: Left Arm, Patient Position: Sitting, Cuff Size: Normal)   Pulse 81   Temp 98.1 F (36.7 C) (Temporal)   Ht 5\' 4"  (1.626 m)   Wt 154 lb 9.6 oz (70.1 kg)   SpO2 97%   BMI 26.54 kg/m  Vitals with BMI 04/09/2020 01/17/2020 12/17/2019  Height 5\' 4"  5\' 4"  -  Weight 154 lbs 10 oz 153 lbs -  BMI 123456 A999333 -  Systolic 123XX123 A999333 A999333  Diastolic 70 66 72  Pulse 81 70 76     Physical Exam  [name].  No new physical  findings today.     Assessment   1. Malaise and fatigue   2. Decreased libido   3. Shortness of breath       Tests ordered Orders Placed This Encounter  Procedures  . T3, free  . Testos,Total,Free and SHBG (Female)     Plan: 1. I will check T3 and testosterone levels today and we may need to adjust doses of both the NP thyroid and testosterone depending on these results. 2. I spent 30 minutes with this patient discussing her symptoms and answering the question she had, mainly the long-term effects of taking NP thyroid and I told her that I do not see any long-term effects as a problem. 3. She will follow up in 2 months time and we will see how she is doing then.   No orders of the defined types were placed in this encounter.   Doree Albee,  MD

## 2020-04-10 ENCOUNTER — Encounter (INDEPENDENT_AMBULATORY_CARE_PROVIDER_SITE_OTHER): Payer: Self-pay | Admitting: Internal Medicine

## 2020-04-10 NOTE — Telephone Encounter (Signed)
Note updated strength in chart.

## 2020-04-13 DIAGNOSIS — M9902 Segmental and somatic dysfunction of thoracic region: Secondary | ICD-10-CM | POA: Diagnosis not present

## 2020-04-13 DIAGNOSIS — M545 Low back pain: Secondary | ICD-10-CM | POA: Diagnosis not present

## 2020-04-13 DIAGNOSIS — M9903 Segmental and somatic dysfunction of lumbar region: Secondary | ICD-10-CM | POA: Diagnosis not present

## 2020-04-13 DIAGNOSIS — M546 Pain in thoracic spine: Secondary | ICD-10-CM | POA: Diagnosis not present

## 2020-04-14 LAB — T3, FREE: T3, Free: 4.6 pg/mL — ABNORMAL HIGH (ref 2.3–4.2)

## 2020-04-14 LAB — TESTOS,TOTAL,FREE AND SHBG (FEMALE)
Free Testosterone: 18.4 pg/mL — ABNORMAL HIGH (ref 0.1–6.4)
Sex Hormone Binding: 38 nmol/L (ref 17–124)
Testosterone, Total, LC-MS-MS: 146 ng/dL — ABNORMAL HIGH (ref 2–45)

## 2020-04-16 ENCOUNTER — Encounter (INDEPENDENT_AMBULATORY_CARE_PROVIDER_SITE_OTHER): Payer: Self-pay | Admitting: Internal Medicine

## 2020-04-16 ENCOUNTER — Other Ambulatory Visit (INDEPENDENT_AMBULATORY_CARE_PROVIDER_SITE_OTHER): Payer: Self-pay | Admitting: Internal Medicine

## 2020-04-16 MED ORDER — THYROID 30 MG PO TABS: 30.0000 mg | ORAL_TABLET | Freq: Every day | ORAL | 3 refills | Status: DC

## 2020-04-23 DIAGNOSIS — M1990 Unspecified osteoarthritis, unspecified site: Secondary | ICD-10-CM | POA: Diagnosis not present

## 2020-04-23 DIAGNOSIS — I776 Arteritis, unspecified: Secondary | ICD-10-CM | POA: Diagnosis not present

## 2020-04-23 DIAGNOSIS — Z79899 Other long term (current) drug therapy: Secondary | ICD-10-CM | POA: Diagnosis not present

## 2020-04-25 ENCOUNTER — Other Ambulatory Visit (INDEPENDENT_AMBULATORY_CARE_PROVIDER_SITE_OTHER): Payer: Self-pay | Admitting: Internal Medicine

## 2020-04-25 DIAGNOSIS — J340 Abscess, furuncle and carbuncle of nose: Secondary | ICD-10-CM | POA: Diagnosis not present

## 2020-04-27 DIAGNOSIS — M9903 Segmental and somatic dysfunction of lumbar region: Secondary | ICD-10-CM | POA: Diagnosis not present

## 2020-04-27 DIAGNOSIS — M9902 Segmental and somatic dysfunction of thoracic region: Secondary | ICD-10-CM | POA: Diagnosis not present

## 2020-04-27 DIAGNOSIS — M546 Pain in thoracic spine: Secondary | ICD-10-CM | POA: Diagnosis not present

## 2020-04-27 DIAGNOSIS — M545 Low back pain: Secondary | ICD-10-CM | POA: Diagnosis not present

## 2020-05-07 DIAGNOSIS — M545 Low back pain: Secondary | ICD-10-CM | POA: Diagnosis not present

## 2020-05-07 DIAGNOSIS — M9902 Segmental and somatic dysfunction of thoracic region: Secondary | ICD-10-CM | POA: Diagnosis not present

## 2020-05-07 DIAGNOSIS — M9903 Segmental and somatic dysfunction of lumbar region: Secondary | ICD-10-CM | POA: Diagnosis not present

## 2020-05-07 DIAGNOSIS — M546 Pain in thoracic spine: Secondary | ICD-10-CM | POA: Diagnosis not present

## 2020-05-23 DIAGNOSIS — M545 Low back pain: Secondary | ICD-10-CM | POA: Diagnosis not present

## 2020-05-23 DIAGNOSIS — M546 Pain in thoracic spine: Secondary | ICD-10-CM | POA: Diagnosis not present

## 2020-05-23 DIAGNOSIS — M9903 Segmental and somatic dysfunction of lumbar region: Secondary | ICD-10-CM | POA: Diagnosis not present

## 2020-05-23 DIAGNOSIS — M9902 Segmental and somatic dysfunction of thoracic region: Secondary | ICD-10-CM | POA: Diagnosis not present

## 2020-06-07 ENCOUNTER — Other Ambulatory Visit: Payer: Self-pay | Admitting: Internal Medicine

## 2020-06-07 DIAGNOSIS — Z1231 Encounter for screening mammogram for malignant neoplasm of breast: Secondary | ICD-10-CM

## 2020-06-11 DIAGNOSIS — M9902 Segmental and somatic dysfunction of thoracic region: Secondary | ICD-10-CM | POA: Diagnosis not present

## 2020-06-11 DIAGNOSIS — M545 Low back pain: Secondary | ICD-10-CM | POA: Diagnosis not present

## 2020-06-11 DIAGNOSIS — M9903 Segmental and somatic dysfunction of lumbar region: Secondary | ICD-10-CM | POA: Diagnosis not present

## 2020-06-11 DIAGNOSIS — M546 Pain in thoracic spine: Secondary | ICD-10-CM | POA: Diagnosis not present

## 2020-06-12 ENCOUNTER — Ambulatory Visit (INDEPENDENT_AMBULATORY_CARE_PROVIDER_SITE_OTHER): Payer: BC Managed Care – PPO | Admitting: Internal Medicine

## 2020-06-12 ENCOUNTER — Other Ambulatory Visit: Payer: Self-pay

## 2020-06-12 ENCOUNTER — Encounter (INDEPENDENT_AMBULATORY_CARE_PROVIDER_SITE_OTHER): Payer: Self-pay | Admitting: Internal Medicine

## 2020-06-12 VITALS — BP 100/70 | HR 79 | Temp 97.8°F | Ht 64.0 in | Wt 150.8 lb

## 2020-06-12 DIAGNOSIS — R5383 Other fatigue: Secondary | ICD-10-CM

## 2020-06-12 DIAGNOSIS — R5381 Other malaise: Secondary | ICD-10-CM

## 2020-06-12 DIAGNOSIS — R0602 Shortness of breath: Secondary | ICD-10-CM

## 2020-06-12 DIAGNOSIS — R6882 Decreased libido: Secondary | ICD-10-CM

## 2020-06-12 MED ORDER — AZITHROMYCIN 250 MG PO TABS: ORAL_TABLET | ORAL | 0 refills | Status: DC

## 2020-06-12 NOTE — Progress Notes (Signed)
Metrics: Intervention Frequency ACO  Documented Smoking Status Yearly  Screened one or more times in 24 months  Cessation Counseling or  Active cessation medication Past 24 months  Past 24 months   Guideline developer: UpToDate (See UpToDate for funding source) Date Released: 2014       Wellness Office Visit  Subjective:  Patient ID: Kara Anderson, female    DOB: Dec 09, 1976  Age: 44 y.o. MRN: [id number]  CC: This lady comes in for follow-up of dyspnea, malaise and fatigue and testosterone therapy. HPI  She is also using, off label, desiccated thyroid for symptoms of thyroid deficiency.  She has tolerated the higher dose. She currently has green/yellow mucus production with some dyspnea but no wheezing. As far as her joints are concerned, she feels that the hydroxychloroquine is seeming to work now and her joints have improved. She continues to try to eat healthier. Past Medical History:  Diagnosis Date  . Decreased libido 09/29/2019  . Endometriosis 2007,2009  . HLD (hyperlipidemia) 09/29/2019  . Malaise and fatigue 09/29/2019  . Mediastinal adenopathy   . Mononucleosis   . Seasonal allergies   . Strep throat   . Viral meningitis   . Vitamin D deficiency disease 09/29/2019   Past Surgical History:  Procedure Laterality Date  . HIP SURGERY  2013  . IR THORACENTESIS ASP PLEURAL SPACE W/IMG GUIDE  11/20/2017  . LAPAROSCOPIC ABDOMINAL EXPLORATION  2007,2009   endometriosis  . MEDIASTINOTOMY CHAMBERLAIN MCNEIL Left 11/16/2017   Procedure: LEFT ANTERIOR MEDIASTINOTOMY CHAMBERLAIN PROCEDURE;  Surgeon: [name], MD;  Location: [facility];  Service: Thoracic;  Laterality: Left;  . NOSE SURGERY  2007     Family History  Problem Relation Age of Onset  . Heart disease Mother   . Stroke Mother   . Diabetes Mother   . Heart disease Father   . 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   Social History   Tobacco Use  . Smoking status: Never Smoker  . Smokeless tobacco: Never Used  Substance Use Topics  . Alcohol use: No    Alcohol/week: 0.0 standard drinks    Current Meds  Medication Sig  . celecoxib (CELEBREX) 200 MG capsule Take 1 capsule (200 mg total) by mouth 2 (two) times daily.  . Cholecalciferol (VITAMIN D-3) 125 MCG (5000 UT) TABS Take 10,000 Units by mouth every other day.   Marland Kitchen co-enzyme Q-10 30 MG capsule Take 30 mg by mouth daily.  Marland Kitchen HYDROXYCHLOROQUINE SULFATE PO Take 300 mg by mouth daily.  . isosorbide mononitrate (IMDUR) 60 MG 24 hr tablet Take 1 tablet by mouth 2 (two) times daily.   . Multiple Vitamins-Minerals (MULTIVITAMIN ADULT) CHEW Chew by mouth daily.  . mycophenolate (CELLCEPT) 500 MG tablet Take 1,500 mg by mouth 2 (two) times daily. 1500 in the AM and 1000 in the PM   . NP THYROID 120 MG tablet TAKE 1 TABLET BY MOUTH DAILY  . predniSONE (DELTASONE) 5 MG tablet Take 5 mg by mouth daily with breakfast.  . Probiotic Product (PROBIOTIC-10) CHEW Chew by mouth daily.  . Testosterone 20 % CREA Apply 15 mg topically daily.  Marland Kitchen thyroid (NP THYROID) 30 MG tablet Take 1 tablet (30 mg total) by mouth daily before breakfast.  . Turmeric (QC TUMERIC COMPLEX) 500 MG CAPS Take 1,000 mg by mouth daily.  . vitamin B-12 (CYANOCOBALAMIN)  1000 MCG tablet Take 3,000 mcg by mouth daily.       Depression screen PHQ 2/9 12/08/2019  Decreased Interest 0  Down, Depressed, Hopeless 0  PHQ - 2 Score 0     Objective:   Today's Vitals: BP 100/70 (BP Location: Left Arm, Patient Position: Sitting, Cuff Size: Normal)   Pulse 79   Temp 97.8 F (36.6 C) (Temporal)   Ht 5\' 4"  (1.626 m)   Wt 150 lb 12.8 oz (68.4 kg)   SpO2 97%   BMI 25.88 kg/m  Vitals with BMI 06/12/2020 04/09/2020 01/17/2020  Height 5\' 4"  5\' 4"  5\' 4"   Weight 150 lbs 13 oz 154 lbs 10 oz 153 lbs  BMI 25.87 97.67 34.19  Systolic 379 024 097  Diastolic 70 70  66  Pulse 79 81 70     Physical Exam  [name].  She has lost 4 pounds since last visit.  Blood pressure is excellent.     Assessment   1. Malaise and fatigue   2. Shortness of breath   3. Decreased libido       Tests ordered No orders of the defined types were placed in this encounter.    Plan: 1. She will continue with testosterone therapy and desiccated NP thyroid as before.  I do not think we need to check levels. 2. I have sent a prescription for Zithromax in case she should need it with her respiratory symptoms.  She is fully vaccinated with COVID-19 vaccine now. 3. I will see her in about 4 months for an annual physical exam.  I spent 30 to 35 minutes with this patient today answering all her questions and discussing her overall health today.   Meds ordered this encounter  Medications  . azithromycin (ZITHROMAX) 250 MG tablet    Sig: Take 2 tablets the first day and then 1 tablet every day for the next 4 days    Dispense:  6 tablet    Refill:  0    Lydon Vansickle Luther Parody, MD

## 2020-06-21 ENCOUNTER — Ambulatory Visit (INDEPENDENT_AMBULATORY_CARE_PROVIDER_SITE_OTHER): Payer: BC Managed Care – PPO | Admitting: Internal Medicine

## 2020-06-21 ENCOUNTER — Encounter (INDEPENDENT_AMBULATORY_CARE_PROVIDER_SITE_OTHER): Payer: Self-pay | Admitting: Internal Medicine

## 2020-06-21 ENCOUNTER — Other Ambulatory Visit: Payer: Self-pay

## 2020-06-21 VITALS — BP 126/70 | HR 78 | Temp 97.1°F | Resp 18 | Ht 64.0 in | Wt 149.5 lb

## 2020-06-21 DIAGNOSIS — R079 Chest pain, unspecified: Secondary | ICD-10-CM | POA: Diagnosis not present

## 2020-06-21 NOTE — Progress Notes (Signed)
Metrics: Intervention Frequency ACO  Documented Smoking Status Yearly  Screened one or more times in 24 months  Cessation Counseling or  Active cessation medication Past 24 months  Past 24 months   Guideline developer: UpToDate (See UpToDate for funding source) Date Released: 2014       Wellness Office Visit  Subjective:  Patient ID: Kara Anderson, female    DOB: 1976/05/08  Age: 44 y.o. MRN: [id number]  CC: This is an acute visit with chief complaint of chest pain and dyspnea. HPI  When I had seen her approximately 10 days ago, she did have some respiratory symptoms.  I told her that if symptoms got worse, she should probably go ahead and take the Zithromax that I had sent to the pharmacy.  She did not fill this prescription and now feels that she has some chest pain which is sharp in nature associated with some dyspnea.  She has had similar symptoms before in the past.  She also has dry eyes.  She wonders whether some of the symptoms are related to COVID-19 vaccination or to her thyroid medication. Past Medical History:  Diagnosis Date  . Decreased libido 09/29/2019  . Endometriosis 2007,2009  . HLD (hyperlipidemia) 09/29/2019  . Malaise and fatigue 09/29/2019  . Mediastinal adenopathy   . Mononucleosis   . Seasonal allergies   . Strep throat   . Viral meningitis   . Vitamin D deficiency disease 09/29/2019   Past Surgical History:  Procedure Laterality Date  . HIP SURGERY  2013  . IR THORACENTESIS ASP PLEURAL SPACE W/IMG GUIDE  11/20/2017  . LAPAROSCOPIC ABDOMINAL EXPLORATION  2007,2009   endometriosis  . MEDIASTINOTOMY CHAMBERLAIN MCNEIL Left 11/16/2017   Procedure: LEFT ANTERIOR MEDIASTINOTOMY CHAMBERLAIN PROCEDURE;  Surgeon: [name], MD;  Location: [facility];  Service: Thoracic;  Laterality: Left;  . NOSE SURGERY  2007     Family History  Problem Relation Age of Onset  . Heart disease Mother   . Stroke Mother   . Diabetes Mother   . Heart disease Father    . 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   Social History   Tobacco Use  . Smoking status: Never Smoker  . Smokeless tobacco: Never Used  Substance Use Topics  . Alcohol use: No    Alcohol/week: 0.0 standard drinks    Current Meds  Medication Sig  . azithromycin (ZITHROMAX) 250 MG tablet Take 2 tablets the first day and then 1 tablet every day for the next 4 days  . celecoxib (CELEBREX) 200 MG capsule Take 1 capsule (200 mg total) by mouth 2 (two) times daily.  . Cholecalciferol (VITAMIN D-3) 125 MCG (5000 UT) TABS Take 10,000 Units by mouth every other day.   Marland Kitchen co-enzyme Q-10 30 MG capsule Take 30 mg by mouth daily.  Marland Kitchen HYDROXYCHLOROQUINE SULFATE PO Take 300 mg by mouth daily.  . isosorbide mononitrate (IMDUR) 60 MG 24 hr tablet Take 1 tablet by mouth 2 (two) times daily.   . Multiple Vitamins-Minerals (MULTIVITAMIN ADULT) CHEW Chew by mouth daily.  . mycophenolate (CELLCEPT) 500 MG tablet Take 1,500 mg by mouth 2 (two) times daily. 1500 in the AM and 1000 in the PM   . NP THYROID 120 MG tablet TAKE 1 TABLET BY MOUTH DAILY  . predniSONE (DELTASONE) 5 MG tablet Take 5 mg by mouth daily with  breakfast.  . Probiotic Product (PROBIOTIC-10) CHEW Chew by mouth daily.  . Testosterone 20 % CREA Apply 15 mg topically daily.  Marland Kitchen thyroid (NP THYROID) 30 MG tablet Take 1 tablet (30 mg total) by mouth daily before breakfast.  . Turmeric (QC TUMERIC COMPLEX) 500 MG CAPS Take 1,000 mg by mouth daily.  . vitamin B-12 (CYANOCOBALAMIN) 1000 MCG tablet Take 3,000 mcg by mouth daily.      Depression screen PHQ 2/9 12/08/2019  Decreased Interest 0  Down, Depressed, Hopeless 0  PHQ - 2 Score 0     Objective:   Today's Vitals: BP 126/70 (BP Location: Right Arm, Patient Position: Sitting, Cuff Size: Normal)   Pulse 78   Temp (!) 97.1 F (36.2 C) (Temporal)   Resp 18   Ht  5\' 4"  (1.626 m)   Wt 149 lb 8 oz (67.8 kg)   SpO2 98%   BMI 25.66 kg/m  Vitals with BMI 06/21/2020 06/12/2020 04/09/2020  Height 5\' 4"  5\' 4"  5\' 4"   Weight 149 lbs 8 oz 150 lbs 13 oz 154 lbs 10 oz  BMI 25.65 14.23 95.32  Systolic 023 343 568  Diastolic 70 70 70  Pulse 78 79 81     Physical Exam  [name].  She is afebrile.  Heart sounds are present without any pericardial rub, gallop rhythm, murmurs.  Lung fields are entirely clear without any pleural rub, crackles, wheezing or bronchial breathing.     Assessment   1. Chest pain, unspecified type       Tests ordered No orders of the defined types were placed in this encounter.    Plan: 1. An ECG was done in the office which shows normal sinus rhythm without any acute ST-T wave changes, in particular no changes of pericarditis or myocarditis. 2. I recommend to the patient that she should go ahead and take the Zithromax.  She can reduce the dose of the NP thyroid so that she is taking 120 mg daily instead of 150 mg daily and see if that has any effect on how she feels. 3. If she does not improve, she will let me know. 4. I spent 30 minutes with the patient evaluating her and discussing her symptoms.   No orders of the defined types were placed in this encounter.   Doree Albee, MD

## 2020-06-26 ENCOUNTER — Encounter (INDEPENDENT_AMBULATORY_CARE_PROVIDER_SITE_OTHER): Payer: Self-pay | Admitting: Internal Medicine

## 2020-06-26 ENCOUNTER — Other Ambulatory Visit (INDEPENDENT_AMBULATORY_CARE_PROVIDER_SITE_OTHER): Payer: Self-pay

## 2020-06-26 MED ORDER — THYROID 120 MG PO TABS: 120.0000 mg | ORAL_TABLET | Freq: Every day | ORAL | 3 refills | Status: DC

## 2020-07-02 DIAGNOSIS — M545 Low back pain: Secondary | ICD-10-CM | POA: Diagnosis not present

## 2020-07-02 DIAGNOSIS — M9903 Segmental and somatic dysfunction of lumbar region: Secondary | ICD-10-CM | POA: Diagnosis not present

## 2020-07-02 DIAGNOSIS — M9902 Segmental and somatic dysfunction of thoracic region: Secondary | ICD-10-CM | POA: Diagnosis not present

## 2020-07-02 DIAGNOSIS — M546 Pain in thoracic spine: Secondary | ICD-10-CM | POA: Diagnosis not present

## 2020-07-08 ENCOUNTER — Encounter (INDEPENDENT_AMBULATORY_CARE_PROVIDER_SITE_OTHER): Payer: Self-pay | Admitting: Internal Medicine

## 2020-07-09 ENCOUNTER — Other Ambulatory Visit (INDEPENDENT_AMBULATORY_CARE_PROVIDER_SITE_OTHER): Payer: Self-pay | Admitting: Internal Medicine

## 2020-07-09 MED ORDER — LEVOFLOXACIN 500 MG PO TABS
500.0000 mg | ORAL_TABLET | Freq: Every day | ORAL | 1 refills | Status: DC
Start: 2020-07-09 — End: 2020-10-16

## 2020-07-10 ENCOUNTER — Other Ambulatory Visit: Payer: Self-pay

## 2020-07-10 ENCOUNTER — Ambulatory Visit
Admission: RE | Admit: 2020-07-10 | Discharge: 2020-07-10 | Disposition: A | Discharge status: Home / Self Care | Payer: BC Managed Care – PPO | Source: Ambulatory Visit | Attending: Internal Medicine | Admitting: Internal Medicine

## 2020-07-10 DIAGNOSIS — Z1231 Encounter for screening mammogram for malignant neoplasm of breast: Secondary | ICD-10-CM | POA: Diagnosis not present

## 2020-07-21 ENCOUNTER — Encounter: Payer: Self-pay | Admitting: Internal Medicine

## 2020-07-23 DIAGNOSIS — M546 Pain in thoracic spine: Secondary | ICD-10-CM | POA: Diagnosis not present

## 2020-07-23 DIAGNOSIS — M9903 Segmental and somatic dysfunction of lumbar region: Secondary | ICD-10-CM | POA: Diagnosis not present

## 2020-07-23 DIAGNOSIS — M9902 Segmental and somatic dysfunction of thoracic region: Secondary | ICD-10-CM | POA: Diagnosis not present

## 2020-07-23 DIAGNOSIS — M545 Low back pain: Secondary | ICD-10-CM | POA: Diagnosis not present

## 2020-07-26 DIAGNOSIS — J340 Abscess, furuncle and carbuncle of nose: Secondary | ICD-10-CM | POA: Diagnosis not present

## 2020-07-30 DIAGNOSIS — Z79899 Other long term (current) drug therapy: Secondary | ICD-10-CM | POA: Diagnosis not present

## 2020-08-24 DIAGNOSIS — M545 Low back pain: Secondary | ICD-10-CM | POA: Diagnosis not present

## 2020-08-24 DIAGNOSIS — M9903 Segmental and somatic dysfunction of lumbar region: Secondary | ICD-10-CM | POA: Diagnosis not present

## 2020-08-24 DIAGNOSIS — M9902 Segmental and somatic dysfunction of thoracic region: Secondary | ICD-10-CM | POA: Diagnosis not present

## 2020-08-24 DIAGNOSIS — M546 Pain in thoracic spine: Secondary | ICD-10-CM | POA: Diagnosis not present

## 2020-08-29 DIAGNOSIS — L578 Other skin changes due to chronic exposure to nonionizing radiation: Secondary | ICD-10-CM | POA: Diagnosis not present

## 2020-08-29 DIAGNOSIS — B360 Pityriasis versicolor: Secondary | ICD-10-CM | POA: Diagnosis not present

## 2020-08-29 DIAGNOSIS — Z86018 Personal history of other benign neoplasm: Secondary | ICD-10-CM | POA: Diagnosis not present

## 2020-08-29 DIAGNOSIS — L570 Actinic keratosis: Secondary | ICD-10-CM | POA: Diagnosis not present

## 2020-08-29 DIAGNOSIS — Z872 Personal history of diseases of the skin and subcutaneous tissue: Secondary | ICD-10-CM | POA: Diagnosis not present

## 2020-09-12 DIAGNOSIS — M546 Pain in thoracic spine: Secondary | ICD-10-CM | POA: Diagnosis not present

## 2020-09-12 DIAGNOSIS — M9902 Segmental and somatic dysfunction of thoracic region: Secondary | ICD-10-CM | POA: Diagnosis not present

## 2020-09-12 DIAGNOSIS — M9903 Segmental and somatic dysfunction of lumbar region: Secondary | ICD-10-CM | POA: Diagnosis not present

## 2020-09-12 DIAGNOSIS — M545 Low back pain: Secondary | ICD-10-CM | POA: Diagnosis not present

## 2020-09-20 DIAGNOSIS — M1990 Unspecified osteoarthritis, unspecified site: Secondary | ICD-10-CM | POA: Diagnosis not present

## 2020-09-20 DIAGNOSIS — Z79899 Other long term (current) drug therapy: Secondary | ICD-10-CM | POA: Diagnosis not present

## 2020-09-20 DIAGNOSIS — I776 Arteritis, unspecified: Secondary | ICD-10-CM | POA: Diagnosis not present

## 2020-10-10 ENCOUNTER — Telehealth (INDEPENDENT_AMBULATORY_CARE_PROVIDER_SITE_OTHER): Payer: Self-pay

## 2020-10-10 NOTE — Telephone Encounter (Signed)
Received a fax from [place] for a refill of Testosterone 20 % CREA [[id number]]  For patient.  Last OV 06/21/2020  Last filled 03/19/2020, 100 g with 1 refill

## 2020-10-10 NOTE — Telephone Encounter (Signed)
Let the patient know that I have already called [facility] and refilled his medication

## 2020-10-16 ENCOUNTER — Other Ambulatory Visit: Payer: Self-pay

## 2020-10-16 ENCOUNTER — Ambulatory Visit (INDEPENDENT_AMBULATORY_CARE_PROVIDER_SITE_OTHER): Payer: BC Managed Care – PPO | Admitting: Internal Medicine

## 2020-10-16 ENCOUNTER — Encounter (INDEPENDENT_AMBULATORY_CARE_PROVIDER_SITE_OTHER): Payer: Self-pay | Admitting: Internal Medicine

## 2020-10-16 VITALS — BP 110/62 | HR 87 | Temp 98.4°F | Resp 18 | Ht 64.0 in | Wt 148.0 lb

## 2020-10-16 DIAGNOSIS — I776 Arteritis, unspecified: Secondary | ICD-10-CM | POA: Diagnosis not present

## 2020-10-16 DIAGNOSIS — M1990 Unspecified osteoarthritis, unspecified site: Secondary | ICD-10-CM | POA: Diagnosis not present

## 2020-10-16 DIAGNOSIS — E559 Vitamin D deficiency, unspecified: Secondary | ICD-10-CM | POA: Diagnosis not present

## 2020-10-16 DIAGNOSIS — R5381 Other malaise: Secondary | ICD-10-CM

## 2020-10-16 DIAGNOSIS — R5383 Other fatigue: Secondary | ICD-10-CM | POA: Diagnosis not present

## 2020-10-16 MED ORDER — TESTOSTERONE 20 % CREA: 15.0000 mg | TOPICAL_CREAM | Freq: Every day | 1 refills | Status: DC

## 2020-10-16 NOTE — Progress Notes (Signed)
Metrics: Intervention Frequency ACO  Documented Smoking Status Yearly  Screened one or more times in 24 months  Cessation Counseling or  Active cessation medication Past 24 months  Past 24 months   Guideline developer: UpToDate (See UpToDate for funding source) Date Released: 2014       Wellness Office Visit  Subjective:  Patient ID: Kara Anderson, female    DOB: Jun 16, 1976  Age: 44 y.o. MRN: [id number]  CC: This lady was due to have an annual physical with me today but she had a lot of questions and so we decided to postpone this to the next visit. HPI  She has decided to stop taking her immunosuppressive therapy and she will discuss this with her rheumatologist later today. She continues on NP thyroid and testosterone therapy as before. She noticed that when she stopped taking the CellCept, she felt much improved the following week to 10 days. She also had questions regarding nutrition.  She tells me that she is checking twice a day.  She does eat lots of vegetables. Past Medical History:  Diagnosis Date  . Decreased libido 09/29/2019  . Endometriosis 2007,2009  . HLD (hyperlipidemia) 09/29/2019  . Malaise and fatigue 09/29/2019  . Mediastinal adenopathy   . Mononucleosis   . Seasonal allergies   . Strep throat   . Viral meningitis   . Vitamin D deficiency disease 09/29/2019   Past Surgical History:  Procedure Laterality Date  . HIP SURGERY  2013  . IR THORACENTESIS ASP PLEURAL SPACE W/IMG GUIDE  11/20/2017  . LAPAROSCOPIC ABDOMINAL EXPLORATION  2007,2009   endometriosis  . MEDIASTINOTOMY CHAMBERLAIN MCNEIL Left 11/16/2017   Procedure: LEFT ANTERIOR MEDIASTINOTOMY CHAMBERLAIN PROCEDURE;  Surgeon: [name], MD;  Location: [place];  Service: Thoracic;  Laterality: Left;  . NOSE SURGERY  2007     Family History  Problem Relation Age of Onset  . Heart disease Mother   . Stroke Mother   . Diabetes Mother   . Heart disease Father   . 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   Social History   Tobacco Use  . Smoking status: Never Smoker  . Smokeless tobacco: Never Used  Substance Use Topics  . Alcohol use: No    Alcohol/week: 0.0 standard drinks    Current Meds  Medication Sig  . Cholecalciferol (VITAMIN D-3) 125 MCG (5000 UT) TABS Take 10,000 Units by mouth every other day.   Marland Kitchen co-enzyme Q-10 30 MG capsule Take 30 mg by mouth daily.  . Multiple Vitamins-Minerals (MULTIVITAMIN ADULT) CHEW Chew by mouth daily.  Marland Kitchen thyroid (NP THYROID) 120 MG tablet Take 1 tablet (120 mg total) by mouth daily.  Marland Kitchen thyroid (NP THYROID) 30 MG tablet Take 1 tablet (30 mg total) by mouth daily before breakfast.  . Turmeric (QC TUMERIC COMPLEX) 500 MG CAPS Take 1,000 mg by mouth daily.  . vitamin B-12 (CYANOCOBALAMIN) 1000 MCG tablet Take 3,000 mcg by mouth daily.  . Testosterone 20 % CREA Apply 15 mg topically daily.      Depression screen PHQ 2/9 12/08/2019  Decreased Interest 0  Down, Depressed, Hopeless 0  PHQ - 2 Score 0     Objective:   Today's Vitals: BP 110/62 (BP Location: Left Arm, Patient Position: Sitting, Cuff Size: Normal)   Pulse 87   Temp 98.4 F (36.9 C) (Temporal)   Resp  18   Ht 5\' 4"  (1.626 m)   Wt 148 lb (67.1 kg)   SpO2 98%   BMI 25.40 kg/m  Vitals with BMI 10/16/2020 06/21/2020 06/12/2020  Height 5\' 4"  5\' 4"  5\' 4"   Weight 148 lbs 149 lbs 8 oz 150 lbs 13 oz  BMI 25.39 97.41 63.84  Systolic 536 468 032  Diastolic 62 70 70  Pulse 87 78 79     Physical Exam  [name].  Weight is stable.  Blood pressure is excellent.     Assessment   1. Malaise and fatigue   2. Vitamin D deficiency disease       Tests ordered No orders of the defined types were placed in this encounter.    Plan: 1. I told her that it is entirely her decision to discontinue immunosuppressive therapy and  she will discuss this with her rheumatologist further. 2. In the meantime, we can certainly optimize her hormones.  She will start taking NP thyroid 120 mg in the morning and an additional dose of NP thyroid 30 mg in the afternoon because she did not tolerate both together. 3. She will continue with the same dose of testosterone cream. 4. I will see her in 3 months for an annual physical exam and she was given influenza vaccination today.   Meds ordered this encounter  Medications  . Testosterone 20 % CREA    Sig: Apply 15 mg topically daily.    Dispense:  100 g    Refill:  1    Veldon Wager Luther Parody, MD

## 2020-10-22 DIAGNOSIS — Z79899 Other long term (current) drug therapy: Secondary | ICD-10-CM | POA: Diagnosis not present

## 2020-10-22 DIAGNOSIS — I776 Arteritis, unspecified: Secondary | ICD-10-CM | POA: Diagnosis not present

## 2020-10-23 DIAGNOSIS — M314 Aortic arch syndrome [Takayasu]: Secondary | ICD-10-CM | POA: Diagnosis not present

## 2020-10-23 DIAGNOSIS — Q256 Stenosis of pulmonary artery: Secondary | ICD-10-CM | POA: Diagnosis not present

## 2020-10-29 DIAGNOSIS — M546 Pain in thoracic spine: Secondary | ICD-10-CM | POA: Diagnosis not present

## 2020-10-29 DIAGNOSIS — M9902 Segmental and somatic dysfunction of thoracic region: Secondary | ICD-10-CM | POA: Diagnosis not present

## 2020-10-29 DIAGNOSIS — M9901 Segmental and somatic dysfunction of cervical region: Secondary | ICD-10-CM | POA: Diagnosis not present

## 2020-10-29 DIAGNOSIS — M542 Cervicalgia: Secondary | ICD-10-CM | POA: Diagnosis not present

## 2020-11-05 DIAGNOSIS — I776 Arteritis, unspecified: Secondary | ICD-10-CM | POA: Diagnosis not present

## 2020-11-14 ENCOUNTER — Other Ambulatory Visit (INDEPENDENT_AMBULATORY_CARE_PROVIDER_SITE_OTHER): Payer: Self-pay | Admitting: Internal Medicine

## 2020-11-14 ENCOUNTER — Encounter (INDEPENDENT_AMBULATORY_CARE_PROVIDER_SITE_OTHER): Payer: Self-pay | Admitting: Internal Medicine

## 2020-11-14 MED ORDER — ESCITALOPRAM OXALATE 5 MG PO TABS
5.0000 mg | ORAL_TABLET | Freq: Every day | ORAL | 3 refills | Status: DC
Start: 2020-11-14 — End: 2021-04-24

## 2020-11-16 DIAGNOSIS — M9902 Segmental and somatic dysfunction of thoracic region: Secondary | ICD-10-CM | POA: Diagnosis not present

## 2020-11-16 DIAGNOSIS — M546 Pain in thoracic spine: Secondary | ICD-10-CM | POA: Diagnosis not present

## 2020-11-16 DIAGNOSIS — M9901 Segmental and somatic dysfunction of cervical region: Secondary | ICD-10-CM | POA: Diagnosis not present

## 2020-11-16 DIAGNOSIS — M542 Cervicalgia: Secondary | ICD-10-CM | POA: Diagnosis not present

## 2020-11-17 ENCOUNTER — Other Ambulatory Visit (INDEPENDENT_AMBULATORY_CARE_PROVIDER_SITE_OTHER): Payer: Self-pay | Admitting: Internal Medicine

## 2020-11-21 DIAGNOSIS — M9901 Segmental and somatic dysfunction of cervical region: Secondary | ICD-10-CM | POA: Diagnosis not present

## 2020-11-21 DIAGNOSIS — M9902 Segmental and somatic dysfunction of thoracic region: Secondary | ICD-10-CM | POA: Diagnosis not present

## 2020-11-21 DIAGNOSIS — M546 Pain in thoracic spine: Secondary | ICD-10-CM | POA: Diagnosis not present

## 2020-11-21 DIAGNOSIS — M542 Cervicalgia: Secondary | ICD-10-CM | POA: Diagnosis not present

## 2020-12-04 DIAGNOSIS — L570 Actinic keratosis: Secondary | ICD-10-CM | POA: Diagnosis not present

## 2020-12-05 DIAGNOSIS — M9901 Segmental and somatic dysfunction of cervical region: Secondary | ICD-10-CM | POA: Diagnosis not present

## 2020-12-05 DIAGNOSIS — M546 Pain in thoracic spine: Secondary | ICD-10-CM | POA: Diagnosis not present

## 2020-12-05 DIAGNOSIS — M542 Cervicalgia: Secondary | ICD-10-CM | POA: Diagnosis not present

## 2020-12-05 DIAGNOSIS — M9902 Segmental and somatic dysfunction of thoracic region: Secondary | ICD-10-CM | POA: Diagnosis not present

## 2020-12-19 DIAGNOSIS — R0600 Dyspnea, unspecified: Secondary | ICD-10-CM | POA: Diagnosis not present

## 2020-12-19 DIAGNOSIS — M1990 Unspecified osteoarthritis, unspecified site: Secondary | ICD-10-CM | POA: Diagnosis not present

## 2020-12-19 DIAGNOSIS — I776 Arteritis, unspecified: Secondary | ICD-10-CM | POA: Diagnosis not present

## 2020-12-19 DIAGNOSIS — Z79899 Other long term (current) drug therapy: Secondary | ICD-10-CM | POA: Diagnosis not present

## 2021-01-03 ENCOUNTER — Other Ambulatory Visit (INDEPENDENT_AMBULATORY_CARE_PROVIDER_SITE_OTHER): Payer: Self-pay | Admitting: Internal Medicine

## 2021-01-07 DIAGNOSIS — J340 Abscess, furuncle and carbuncle of nose: Secondary | ICD-10-CM | POA: Diagnosis not present

## 2021-01-15 DIAGNOSIS — L298 Other pruritus: Secondary | ICD-10-CM | POA: Diagnosis not present

## 2021-01-15 DIAGNOSIS — L570 Actinic keratosis: Secondary | ICD-10-CM | POA: Diagnosis not present

## 2021-01-15 DIAGNOSIS — L249 Irritant contact dermatitis, unspecified cause: Secondary | ICD-10-CM | POA: Diagnosis not present

## 2021-01-16 ENCOUNTER — Telehealth (INDEPENDENT_AMBULATORY_CARE_PROVIDER_SITE_OTHER): Payer: BC Managed Care – PPO | Admitting: Internal Medicine

## 2021-01-16 ENCOUNTER — Encounter (INDEPENDENT_AMBULATORY_CARE_PROVIDER_SITE_OTHER): Payer: Self-pay | Admitting: Internal Medicine

## 2021-01-16 ENCOUNTER — Telehealth (INDEPENDENT_AMBULATORY_CARE_PROVIDER_SITE_OTHER): Payer: Self-pay

## 2021-01-16 DIAGNOSIS — R0602 Shortness of breath: Secondary | ICD-10-CM

## 2021-01-16 NOTE — Telephone Encounter (Signed)
Called Kara Anderson to  Start the telehealth  Rooming via phone. Pt was upset and asked to cancel the appointment. Reason was that she is upset that she is feeling all this SOB, congestion nasal and pain. But we cant see in the office. Pt was very tearful in her voice. But I explained the protocol we have to follow and the safety of other in office. I reassure her if this was in "diffent times" pre pandemic it would be no problem.Stated to pt that Dr Anastasio Champion has been requesting from Management to get The Rapid Testing here in the office so we can see her in the Back door curb service if we had test kits. It would take only 15 min's to get a results. Then we could teat form back ofifce and help with needs.  Pt accepted &  understands, just not happy she feels bad.She will call back to reschedule the appointment.

## 2021-01-17 ENCOUNTER — Ambulatory Visit (INDEPENDENT_AMBULATORY_CARE_PROVIDER_SITE_OTHER): Payer: BC Managed Care – PPO

## 2021-01-17 ENCOUNTER — Encounter: Payer: Self-pay | Admitting: Emergency Medicine

## 2021-01-17 ENCOUNTER — Other Ambulatory Visit: Payer: Self-pay

## 2021-01-17 ENCOUNTER — Encounter (INDEPENDENT_AMBULATORY_CARE_PROVIDER_SITE_OTHER): Payer: Self-pay | Admitting: Internal Medicine

## 2021-01-17 ENCOUNTER — Ambulatory Visit
Admission: EM | Admit: 2021-01-17 | Discharge: 2021-01-17 | Disposition: A | Discharge status: Home / Self Care | Payer: BC Managed Care – PPO | Attending: Emergency Medicine | Admitting: Emergency Medicine

## 2021-01-17 ENCOUNTER — Ambulatory Visit (INDEPENDENT_AMBULATORY_CARE_PROVIDER_SITE_OTHER): Payer: BC Managed Care – PPO | Admitting: Internal Medicine

## 2021-01-17 DIAGNOSIS — R0602 Shortness of breath: Secondary | ICD-10-CM | POA: Diagnosis not present

## 2021-01-17 DIAGNOSIS — J069 Acute upper respiratory infection, unspecified: Secondary | ICD-10-CM | POA: Diagnosis not present

## 2021-01-17 DIAGNOSIS — Z20822 Contact with and (suspected) exposure to covid-19: Secondary | ICD-10-CM | POA: Diagnosis not present

## 2021-01-17 MED ORDER — AZITHROMYCIN 250 MG PO TABS: 250.0000 mg | ORAL_TABLET | Freq: Every day | ORAL | 0 refills | Status: DC

## 2021-01-17 MED ORDER — BENZONATATE 100 MG PO CAPS: 100.0000 mg | ORAL_CAPSULE | Freq: Three times a day (TID) | ORAL | 0 refills | Status: DC | PRN

## 2021-01-17 MED ORDER — ALBUTEROL SULFATE HFA 108 (90 BASE) MCG/ACT IN AERS: 1.0000 | INHALATION_SPRAY | Freq: Four times a day (QID) | RESPIRATORY_TRACT | 0 refills | Status: DC | PRN

## 2021-01-17 NOTE — ED Provider Notes (Addendum)
[place]   [id number] 01/17/21 Arrival Time: 5053   CC:URI  SUBJECTIVE: History from: patient.  Kara Anderson is a 45 y.o. female who presented to the urgent care with a complaint of chills, fever, nasal congestion, cough with yellowish-green sputum, and shortness of breath  for the past few days.  States husband tested positive for Covid 3 weeks ago.  Denies sick exposure flu or strep.  Denies recent travel.  Has tried OTC medication without relief.  Denies alleviating or aggravating factors.  Denies previous symptoms in the past.   Denies fever, chills, fatigue, sinus pain, rhinorrhea, sore throat, wheezing, chest pain, nausea, changes in bowel or bladder habits.     ROS: As per HPI.  All other pertinent ROS negative.      Past Medical History:  Diagnosis Date  . Decreased libido 09/29/2019  . Endometriosis 2007,2009  . HLD (hyperlipidemia) 09/29/2019  . Malaise and fatigue 09/29/2019  . Mediastinal adenopathy   . Mononucleosis   . Seasonal allergies   . Strep throat   . Viral meningitis   . Vitamin D deficiency disease 09/29/2019   Past Surgical History:  Procedure Laterality Date  . HIP SURGERY  2013  . IR THORACENTESIS ASP PLEURAL SPACE W/IMG GUIDE  11/20/2017  . LAPAROSCOPIC ABDOMINAL EXPLORATION  2007,2009   endometriosis  . MEDIASTINOTOMY CHAMBERLAIN MCNEIL Left 11/16/2017   Procedure: LEFT ANTERIOR MEDIASTINOTOMY CHAMBERLAIN PROCEDURE;  Surgeon: [name], MD;  Location: [place];  Service: Thoracic;  Laterality: Left;  . NOSE SURGERY  2007   Allergies  Allergen Reactions  . Codeine Nausea Only   No current facility-administered medications on file prior to encounter.   Current Outpatient Medications on File Prior to Encounter  Medication Sig Dispense Refill  . celecoxib (CELEBREX) 200 MG capsule Take 1 capsule (200 mg total) by mouth 2 (two) times daily. (Patient not taking: Reported on 10/16/2020) 60 capsule 2  . Cholecalciferol  (VITAMIN D-3) 125 MCG (5000 UT) TABS Take 10,000 Units by mouth every other day.     Marland Kitchen co-enzyme Q-10 30 MG capsule Take 30 mg by mouth daily.    Marland Kitchen escitalopram (LEXAPRO) 5 MG tablet Take 1 tablet (5 mg total) by mouth daily. 30 tablet 3  . fluticasone (FLONASE) 50 MCG/ACT nasal spray Place 1 spray into both nostrils daily for 14 days. 16 g 0  . isosorbide mononitrate (IMDUR) 60 MG 24 hr tablet Take 1 tablet by mouth 2 (two) times daily.     . Multiple Vitamins-Minerals (MULTIVITAMIN ADULT) CHEW Chew by mouth daily.    . NP THYROID 120 MG tablet TAKE 1 TABLET(120 MG) BY MOUTH DAILY 30 tablet 3  . NP THYROID 30 MG tablet TAKE 1 TABLET(30 MG) BY MOUTH DAILY BEFORE BREAKFAST 30 tablet 3  . Testosterone 20 % CREA Apply 15 mg topically daily. 100 g 1  . Turmeric (QC TUMERIC COMPLEX) 500 MG CAPS Take 1,000 mg by mouth daily.    . vitamin B-12 (CYANOCOBALAMIN) 1000 MCG tablet Take 3,000 mcg by mouth daily.     Social History   Socioeconomic History  . Marital status: Married    Spouse name: Not on file  . Number of children: Not on file  . Years of education: Not on file  . Highest education level: Not on file  Occupational History  . Not on file  Tobacco Use  . Smoking status: Never Smoker  . Smokeless tobacco: Never Used  Vaping Use  . Vaping  Use: Never used  Substance and Sexual Activity  . Alcohol use: No    Alcohol/week: 0.0 standard drinks  . Drug use: No  . Sexual activity: Yes    Birth control/protection: None  Other Topics Concern  . 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   Social Determinants of Health   Financial Resource Strain: Not on file  Food Insecurity: Not on file  Transportation Needs: Not on file  Physical Activity: Not on file  Stress: Not on file  Social Connections: Not on file  Intimate Partner Violence: Not on file   Family History  Problem Relation Age of Onset   . Heart disease Mother   . Stroke Mother   . Diabetes Mother   . Heart disease Father   . Alcohol abuse Father     OBJECTIVE:  Vitals:   01/17/21 1201  BP: 110/71  Pulse: 80  Resp: 18  Temp: 98.6 F (37 C)  TempSrc: Oral  SpO2: 96%  Weight: 145 lb (65.8 kg)     General appearance: alert; appears fatigued, but nontoxic; speaking in full sentences and tolerating own secretions HEENT: NCAT; Ears: EACs clear, TMs pearly gray; Eyes: PERRL.  EOM grossly intact. Sinuses: nontender; Nose: nares patent without rhinorrhea, Throat: oropharynx clear, tonsils non erythematous or enlarged, uvula midline  Neck: supple without LAD Lungs: unlabored respirations, symmetrical air entry; cough: mild; no respiratory distress; CTAB Heart: regular rate and rhythm.  Radial pulses 2+ symmetrical bilaterally Skin: warm and dry Psychological: alert and cooperative; normal mood and affect  LABS:  No results found for this or any previous visit (from the past 24 hour(s)).   RADIOLOGY:  DG Chest 2 View  Result Date: 01/17/2021 CLINICAL DATA:  Shortness of breath. EXAM: CHEST - 2 VIEW COMPARISON:  01/19/2020. FINDINGS: Mediastinum and hilar structures stable. Stent noted over the left hilar region in unchanged position. Heart size normal. No focal infiltrate. No pleural effusion or pneumothorax. Mild scoliosis thoracic spine. IMPRESSION: No acute cardiopulmonary disease. Electronically Signed   By: Marcello Moores  Register   On: 01/17/2021 12:47    Chest X-ray is negative for cardiopulmonary disease.  I have reviewed the x-ray myself and the radiologist interpretation.  I am in agreement with the radiologist interpretation.  ASSESSMENT & PLAN:  1. SOB (shortness of breath)   2. Acute URI   3. Exposure to COVID-19 virus     Meds ordered this encounter  Medications  . albuterol (VENTOLIN HFA) 108 (90 Base) MCG/ACT inhaler    Sig: Inhale 1-2 puffs into the lungs every 6 (six) hours as needed.    Dispense:   18 g    Refill:  0  . benzonatate (TESSALON) 100 MG capsule    Sig: Take 1 capsule (100 mg total) by mouth 3 (three) times daily as needed for cough.    Dispense:  30 capsule    Refill:  0  . azithromycin (ZITHROMAX) 250 MG tablet    Sig: Take 1 tablet (250 mg total) by mouth daily. Take first 2 tablets together, then 1 every day until finished.    Dispense:  6 tablet    Refill:  0   She is stable at discharge.  Has been exposed 3 weeks ago and therefore less likely symptom is from Covid but will rule out Covid with a PCR test.  Will prescribe Tessalon Perles, azithromycin and albuterol.  Discharge Instyructions  COVID testing ordered.  It will take between 2-7 days for test results.  Someone will contact you regarding abnormal results.    Get plenty of rest and push fluids Tessalon Perles prescribed for cough Azithromycin was prescribed Albuterol as prescribed for cough Use medications daily for symptom relief Use OTC medications like ibuprofen or tylenol as needed fever or pain Call or go to the ED if you have any new or worsening symptoms such as fever, worsening cough, shortness of breath, chest tightness, chest pain, turning blue, changes in mental status, etc...   Reviewed expectations re: course of current medical issues. Questions answered. Outlined signs and symptoms indicating need for more acute intervention. Patient verbalized understanding. After Visit Summary given.         Emerson Monte, FNP 01/17/21 1309    Emerson Monte, FNP 01/17/21 1310

## 2021-01-17 NOTE — Discharge Instructions (Addendum)
COVID testing ordered.  It will take between 2-7 days for test results.  Someone will contact you regarding abnormal results.    Get plenty of rest and push fluids Tessalon Perles prescribed for cough Azithromycin was prescribed Albuterol as prescribed for cough Use medications daily for symptom relief Use OTC medications like ibuprofen or tylenol as needed fever or pain Call or go to the ED if you have any new or worsening symptoms such as fever, worsening cough, shortness of breath, chest tightness, chest pain, turning blue, changes in mental status, etc..Marland Kitchen

## 2021-01-17 NOTE — ED Triage Notes (Signed)
Pt has been having headaches, chest/nasal congestion, and shortness of breath.  Mucous is yellow/green color.  No fevers.

## 2021-01-17 NOTE — Progress Notes (Signed)
Appointment canceled by patient

## 2021-01-18 LAB — NOVEL CORONAVIRUS, NAA: SARS-CoV-2, NAA: NOT DETECTED

## 2021-01-18 LAB — SARS-COV-2, NAA 2 DAY TAT

## 2021-01-21 ENCOUNTER — Other Ambulatory Visit: Payer: Self-pay

## 2021-01-21 ENCOUNTER — Encounter (INDEPENDENT_AMBULATORY_CARE_PROVIDER_SITE_OTHER): Payer: Self-pay | Admitting: Internal Medicine

## 2021-01-21 ENCOUNTER — Ambulatory Visit (INDEPENDENT_AMBULATORY_CARE_PROVIDER_SITE_OTHER): Payer: BC Managed Care – PPO | Admitting: Internal Medicine

## 2021-01-21 VITALS — BP 120/70 | HR 72 | Temp 96.6°F | Ht 64.0 in | Wt 145.4 lb

## 2021-01-21 DIAGNOSIS — R5383 Other fatigue: Secondary | ICD-10-CM | POA: Diagnosis not present

## 2021-01-21 DIAGNOSIS — R5381 Other malaise: Secondary | ICD-10-CM | POA: Diagnosis not present

## 2021-01-21 DIAGNOSIS — R0602 Shortness of breath: Secondary | ICD-10-CM

## 2021-01-21 DIAGNOSIS — R6882 Decreased libido: Secondary | ICD-10-CM

## 2021-01-21 NOTE — Progress Notes (Signed)
Metrics: Intervention Frequency ACO  Documented Smoking Status Yearly  Screened one or more times in 24 months  Cessation Counseling or  Active cessation medication Past 24 months  Past 24 months   Guideline developer: UpToDate (See UpToDate for funding source) Date Released: 2014       Wellness Office Visit  Subjective:  Patient ID: Kara Anderson, female    DOB: 04/05/1976  Age: 45 y.o. MRN: [id number]  CC: This lady comes in because she has been feeling unwell lately.  She has had respiratory symptoms and she went to the urgent care and was treated empirically with Zithromax.  These respiratory symptoms seem to be improving somewhat. HPI  She did have COVID-19 test and it was negative.  She has had the symptoms for the last 3 to 4 weeks.  She also describes a feeling of heat in her body.  She feels somewhat rundown constantly. She also has virtually 0 libido.  She wonders if Lexapro is doing this also. Past Medical History:  Diagnosis Date  . Decreased libido 09/29/2019  . Endometriosis 2007,2009  . HLD (hyperlipidemia) 09/29/2019  . Malaise and fatigue 09/29/2019  . Mediastinal adenopathy   . Mononucleosis   . Seasonal allergies   . Strep throat   . Viral meningitis   . Vitamin D deficiency disease 09/29/2019   Past Surgical History:  Procedure Laterality Date  . HIP SURGERY  2013  . IR THORACENTESIS ASP PLEURAL SPACE W/IMG GUIDE  11/20/2017  . LAPAROSCOPIC ABDOMINAL EXPLORATION  2007,2009   endometriosis  . MEDIASTINOTOMY CHAMBERLAIN MCNEIL Left 11/16/2017   Procedure: LEFT ANTERIOR MEDIASTINOTOMY CHAMBERLAIN PROCEDURE;  Surgeon: [name], MD;  Location: [place];  Service: Thoracic;  Laterality: Left;  . NOSE SURGERY  2007     Family History  Problem Relation Age of Onset  . Heart disease Mother   . Stroke Mother   . Diabetes Mother   . Heart disease Father   . 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   Social History   Tobacco Use  . Smoking status: Never Smoker  . Smokeless tobacco: Never Used  Substance Use Topics  . Alcohol use: No    Alcohol/week: 0.0 standard drinks    Current Meds  Medication Sig  . albuterol (VENTOLIN HFA) 108 (90 Base) MCG/ACT inhaler Inhale 1-2 puffs into the lungs every 6 (six) hours as needed.  Marland Kitchen azithromycin (ZITHROMAX) 250 MG tablet Take 1 tablet (250 mg total) by mouth daily. Take first 2 tablets together, then 1 every day until finished.  . Cholecalciferol (VITAMIN D-3) 125 MCG (5000 UT) TABS Take 10,000 Units by mouth every other day.   Marland Kitchen co-enzyme Q-10 30 MG capsule Take 30 mg by mouth daily.  Marland Kitchen escitalopram (LEXAPRO) 5 MG tablet Take 1 tablet (5 mg total) by mouth daily.  . folic acid (FOLVITE) 0.5 MG tablet Take 1 mg by mouth daily.  . isosorbide mononitrate (IMDUR) 60 MG 24 hr tablet Take 1.5 tablets by mouth 2 (two) times daily.  . Methotrexate 25 MG/ML SOSY Inject 25 mg into the skin.  . Multiple Vitamins-Minerals (MULTIVITAMIN ADULT) CHEW Chew by mouth daily.  . NP THYROID 120 MG tablet TAKE 1 TABLET(120 MG) BY MOUTH DAILY  . NP THYROID 30 MG tablet TAKE 1 TABLET(30 MG) BY MOUTH DAILY BEFORE BREAKFAST  . Testosterone 20 % CREA  Apply 15 mg topically daily.  . Turmeric 500 MG CAPS Take 1,000 mg by mouth daily.  . vitamin B-12 (CYANOCOBALAMIN) 1000 MCG tablet Take 3,000 mcg by mouth daily.      Depression screen PHQ 2/9 12/08/2019  Decreased Interest 0  Down, Depressed, Hopeless 0  PHQ - 2 Score 0     Objective:   Today's Vitals: BP 120/70   Pulse 72   Temp (!) 96.6 F (35.9 C)   Ht 5\' 4"  (1.626 m)   Wt 145 lb 6.4 oz (66 kg)   LMP 01/16/2021   BMI 24.96 kg/m  Vitals with BMI 01/21/2021 01/17/2021 01/16/2021  Height 5\' 4"  - (No Data)  Weight 145 lbs 6 oz 145 lbs (No Data)  BMI 16.38 - -  Systolic 453 646 (No Data)  Diastolic 70 71 (No Data)  Pulse 72 80 -      Physical Exam  [name].  Weight is stable.  Blood pressure is excellent.  She is afebrile. Lung fields are clear with no crackles, wheezing or bronchial breathing.    Assessment   1. Malaise and fatigue   2. Shortness of breath   3. Decreased libido       Tests ordered No orders of the defined types were placed in this encounter.    Plan: 1. We discussed the feeling of heat in her body which is not quite like hot flashes.  I wondered whether thyroid medication is contributing so we agreed, with shared decision making, to go down to taking NP thyroid 120 mg daily for at least a couple of weeks to see if there is a difference. 2. In terms of her decreased libido, again with shared decision making, she is going to try and come off the Lexapro gradually and I have told her to take Lexapro 5 mg every other day for the next 2 weeks and then 3 times a week for 2 weeks after that and then 2 times a week for 2 weeks and then discontinue.  We also discussed the use of Effexor instead of Lexapro in the future should she require it. 3. Follow-up with me in about 2 months for an annual physical exam.   No orders of the defined types were placed in this encounter.   Doree Albee, MD

## 2021-01-28 DIAGNOSIS — M1990 Unspecified osteoarthritis, unspecified site: Secondary | ICD-10-CM | POA: Diagnosis not present

## 2021-01-28 DIAGNOSIS — I776 Arteritis, unspecified: Secondary | ICD-10-CM | POA: Diagnosis not present

## 2021-01-28 DIAGNOSIS — Z79899 Other long term (current) drug therapy: Secondary | ICD-10-CM | POA: Diagnosis not present

## 2021-02-03 ENCOUNTER — Encounter (INDEPENDENT_AMBULATORY_CARE_PROVIDER_SITE_OTHER): Payer: Self-pay | Admitting: Internal Medicine

## 2021-02-04 ENCOUNTER — Encounter (INDEPENDENT_AMBULATORY_CARE_PROVIDER_SITE_OTHER): Payer: Self-pay

## 2021-02-04 ENCOUNTER — Ambulatory Visit (INDEPENDENT_AMBULATORY_CARE_PROVIDER_SITE_OTHER): Payer: BC Managed Care – PPO

## 2021-02-04 ENCOUNTER — Other Ambulatory Visit: Payer: Self-pay

## 2021-02-04 VITALS — HR 76 | Temp 97.3°F | Resp 18

## 2021-02-04 DIAGNOSIS — M1990 Unspecified osteoarthritis, unspecified site: Secondary | ICD-10-CM

## 2021-02-04 NOTE — Progress Notes (Signed)
Pt was given new medication injection today from Dr. Gordan Payment @[facility]. Was given 10 units  with her needles brought from [facility]. Pt tolerated well ; no complaints. Pt will contact if she needs more help when to given injections.

## 2021-02-12 DIAGNOSIS — L298 Other pruritus: Secondary | ICD-10-CM | POA: Diagnosis not present

## 2021-02-12 DIAGNOSIS — Z872 Personal history of diseases of the skin and subcutaneous tissue: Secondary | ICD-10-CM | POA: Diagnosis not present

## 2021-02-13 MED ORDER — NP THYROID 60 MG PO TABS: 60.0000 mg | ORAL_TABLET | Freq: Every day | ORAL | 3 refills | Status: DC

## 2021-02-16 ENCOUNTER — Other Ambulatory Visit: Payer: Self-pay

## 2021-02-16 ENCOUNTER — Emergency Department (HOSPITAL_COMMUNITY): Payer: BC Managed Care – PPO

## 2021-02-16 ENCOUNTER — Encounter (HOSPITAL_COMMUNITY): Payer: Self-pay

## 2021-02-16 ENCOUNTER — Emergency Department (HOSPITAL_COMMUNITY)
Admission: EM | Admit: 2021-02-16 | Discharge: 2021-02-16 | Disposition: A | Discharge status: Home / Self Care | Payer: BC Managed Care – PPO | Attending: Emergency Medicine | Admitting: Emergency Medicine

## 2021-02-16 DIAGNOSIS — R079 Chest pain, unspecified: Secondary | ICD-10-CM

## 2021-02-16 DIAGNOSIS — R531 Weakness: Secondary | ICD-10-CM | POA: Diagnosis not present

## 2021-02-16 DIAGNOSIS — R0600 Dyspnea, unspecified: Secondary | ICD-10-CM | POA: Diagnosis not present

## 2021-02-16 DIAGNOSIS — Z20822 Contact with and (suspected) exposure to covid-19: Secondary | ICD-10-CM | POA: Insufficient documentation

## 2021-02-16 DIAGNOSIS — R0789 Other chest pain: Secondary | ICD-10-CM | POA: Diagnosis not present

## 2021-02-16 DIAGNOSIS — R42 Dizziness and giddiness: Secondary | ICD-10-CM | POA: Diagnosis not present

## 2021-02-16 DIAGNOSIS — R0602 Shortness of breath: Secondary | ICD-10-CM | POA: Diagnosis not present

## 2021-02-16 DIAGNOSIS — R059 Cough, unspecified: Secondary | ICD-10-CM | POA: Diagnosis not present

## 2021-02-16 HISTORY — DX: Disorder of thyroid, unspecified: E07.9

## 2021-02-16 HISTORY — DX: Aortic arch syndrome (takayasu): M31.4

## 2021-02-16 LAB — TROPONIN I (HIGH SENSITIVITY)
Troponin I (High Sensitivity): <2 ng/L (ref <18)
Troponin I (High Sensitivity): 2 ng/L (ref <18)

## 2021-02-16 LAB — CBC
HCT: 40.4 % (ref 36.0–46.0)
Hemoglobin: 13.5 g/dL (ref 12.0–15.0)
MCH: 31.9 pg (ref 26.0–34.0)
MCHC: 33.4 g/dL (ref 30.0–36.0)
MCV: 95.5 fL (ref 80.0–100.0)
Platelets: 266 10*3/uL (ref 150–400)
RBC: 4.23 MIL/uL (ref 3.87–5.11)
RDW: 13.2 % (ref 11.5–15.5)
WBC: 5.5 10*3/uL (ref 4.0–10.5)
nRBC: 0 % (ref 0.0–0.2)

## 2021-02-16 LAB — COMPREHENSIVE METABOLIC PANEL
ALT: 12 U/L (ref 0–44)
AST: 18 U/L (ref 15–41)
Albumin: 4.3 g/dL (ref 3.5–5.0)
Alkaline Phosphatase: 34 U/L — ABNORMAL LOW (ref 38–126)
Anion gap: 10 (ref 5–15)
BUN: 13 mg/dL (ref 6–20)
CO2: 23 mmol/L (ref 22–32)
Calcium: 9.4 mg/dL (ref 8.9–10.3)
Chloride: 103 mmol/L (ref 98–111)
Creatinine, Ser: 0.63 mg/dL (ref 0.44–1.00)
GFR, Estimated: >60 mL/min (ref >60)
Glucose, Bld: 83 mg/dL (ref 70–99)
Potassium: 3.9 mmol/L (ref 3.5–5.1)
Sodium: 136 mmol/L (ref 135–145)
Total Bilirubin: 0.7 mg/dL (ref 0.3–1.2)
Total Protein: 7 g/dL (ref 6.5–8.1)

## 2021-02-16 LAB — RESP PANEL BY RT-PCR (FLU A&B, COVID) ARPGX2
Influenza A by PCR: NEGATIVE
Influenza B by PCR: NEGATIVE
SARS Coronavirus 2 by RT PCR: NEGATIVE

## 2021-02-16 NOTE — ED Provider Notes (Signed)
[facility] EMERGENCY DEPARTMENT Provider Note   CSN: [id number] Arrival date & time: 02/16/21  1000     History Chief Complaint  Patient presents with  . Chest Pain    Kara Anderson is a 45 y.o. female.  HPI 45 yo female ho vasculitis (reported by patient as Takyasu's), ho pulmonary artery , endothelial cell dysfunction treated by [facility] and [facility], currently followed by rheumatology and cardiology at [facility] presents today with left sided chest pressure x 2 weeks.  She is unable to tell me that last time  it was completely resolved.  She reports it has been 10 out of 10 the worst is currently 3 out of 10.  This is left sided to the left lateral chest and she describes it as pressure.  It is worse with exertion and improves with rest although takes quite a while.  She has spoken to her physicians at [facility] and had her Imdur increased earlier this week.  This is not really impacted her symptoms.  She has some [place] stated dyspnea.  She denies any current cough.  She states that she had a probable Covid exposure in February although she never tested positive.  She has been vaccinated 3 times with her last vaccine in August due to immunosuppressed condition.  She reports that she has been on and off for immunosuppressants and is currently not taking her immunosuppressants due to symptoms.  Review of rheumatology records from [facility] show that patient was tapered off of her MMF in January.  She was started on Plaquenil and low-dose prednisone and has discussed adding methotrexate.  Past Medical History:  Diagnosis Date  . Decreased libido 09/29/2019  . Endometriosis 2007,2009  . HLD (hyperlipidemia) 09/29/2019  . Malaise and fatigue 09/29/2019  . Mediastinal adenopathy   . Mononucleosis   . Seasonal allergies   . Strep throat   . Takayasu's arteritis ([place])   . Thyroid disease   . Viral meningitis   . Vitamin D deficiency disease 09/29/2019    Patient Active Problem List   Diagnosis Date Noted  .  HLD (hyperlipidemia) 09/29/2019  . Vitamin D deficiency disease 09/29/2019  . Malaise and fatigue 09/29/2019  . Decreased libido 09/29/2019  . Takayasu's arteritis ([place]) 10/14/2018  . Vasculitis ([place]) 12/19/2017  . Mediastinal mass 11/04/2017  . Pulmonary stenosis 11/04/2017  . Shortness of breath 10/31/2017  . Bruit 10/31/2017  . Murmur 10/31/2017  . Breast mass, right 10/29/2015    Past Surgical History:  Procedure Laterality Date  . HIP SURGERY  2013  . IR THORACENTESIS ASP PLEURAL SPACE W/IMG GUIDE  11/20/2017  . LAPAROSCOPIC ABDOMINAL EXPLORATION  2007,2009   endometriosis  . MEDIASTINOTOMY CHAMBERLAIN MCNEIL Left 11/16/2017   Procedure: LEFT ANTERIOR MEDIASTINOTOMY CHAMBERLAIN PROCEDURE;  Surgeon: [name], MD;  Location: [facility];  Service: Thoracic;  Laterality: Left;  . NOSE SURGERY  2007     OB History    Gravida  0   Para  0   Term  0   Preterm  0   AB  0   Living  0     SAB  0   IAB  0   Ectopic  0   Multiple  0   Live Births           Obstetric Comments  1st Menstrual Cycle:  12         Family History  Problem Relation Age of Onset  . Heart disease Mother   .  Stroke Mother   . Diabetes Mother   . Heart disease Father   . Alcohol abuse Father     Social History   Tobacco Use  . Smoking status: Never Smoker  . Smokeless tobacco: Never Used  Vaping Use  . Vaping Use: Never used  Substance Use Topics  . Alcohol use: No    Alcohol/week: 0.0 standard drinks  . Drug use: No    Home Medications Prior to Admission medications   Medication Sig Start Date End Date Taking? Authorizing Provider  albuterol (VENTOLIN HFA) 108 (90 Base) MCG/ACT inhaler Inhale 1-2 puffs into the lungs every 6 (six) hours as needed. 01/17/21   Avegno, Darrelyn Hillock, FNP  azithromycin (ZITHROMAX) 250 MG tablet Take 1 tablet (250 mg total) by mouth daily. Take first 2 tablets together, then 1 every day until finished. 01/17/21   Avegno, Darrelyn Hillock, FNP   Cholecalciferol (VITAMIN D-3) 125 MCG (5000 UT) TABS Take 10,000 Units by mouth every other day.     [provider]  co-enzyme Q-10 30 MG capsule Take 30 mg by mouth daily.    [provider]  escitalopram (LEXAPRO) 5 MG tablet Take 1 tablet (5 mg total) by mouth daily. 11/14/20   Doree Albee, MD  fluticasone (FLONASE) 50 MCG/ACT nasal spray Place 1 spray into both nostrils daily for 14 days. 12/17/19 12/31/19  Avegno, Darrelyn Hillock, FNP  folic acid (FOLVITE) 0.5 MG tablet Take 1 mg by mouth daily.    [provider]  isosorbide mononitrate (IMDUR) 60 MG 24 hr tablet Take 1.5 tablets by mouth 2 (two) times daily. 08/25/19 01/21/21  [provider]  Methotrexate 25 MG/ML SOSY Inject 25 mg into the skin.    [provider]  Multiple Vitamins-Minerals (MULTIVITAMIN ADULT) CHEW Chew by mouth daily.    [provider]  NP THYROID 120 MG tablet TAKE 1 TABLET(120 MG) BY MOUTH DAILY 11/17/20   Hurshel Party C, MD  NP THYROID 30 MG tablet TAKE 1 TABLET(30 MG) BY MOUTH DAILY BEFORE BREAKFAST 01/03/21   Doree Albee, MD  NP THYROID 60 MG tablet Take 1 tablet (60 mg total) by mouth daily before breakfast. 02/13/21 03/15/21  Doree Albee, MD  Testosterone 20 % CREA Apply 15 mg topically daily. 10/16/20   Doree Albee, MD  Turmeric 500 MG CAPS Take 1,000 mg by mouth daily.    [provider]  vitamin B-12 (CYANOCOBALAMIN) 1000 MCG tablet Take 3,000 mcg by mouth daily.    [provider]    Allergies    Codeine  Review of Systems   Review of Systems  Constitutional: Positive for appetite change.  Respiratory: Positive for shortness of breath.   Cardiovascular: Positive for chest pain. Negative for leg swelling.  Gastrointestinal: Negative.   Endocrine: Negative.   Musculoskeletal: Negative.   All other systems reviewed and are negative.   Physical Exam Updated Vital Signs BP (!) 135/91 (BP Location: Left Arm)   Pulse  71   Temp 98 F (36.7 C) (Oral)   Resp 13   Ht 1.626 m (5\' 4" )   Wt 66 kg   LMP 02/14/2021   SpO2 100%   BMI 24.98 kg/m   Physical Exam Vitals and nursing note reviewed.  Constitutional:      Appearance: She is well-developed.  HENT:     Head: Normocephalic.  Eyes:     Pupils: Pupils are equal, round, and reactive to light.  Cardiovascular:  Rate and Rhythm: Normal rate and regular rhythm.     Heart sounds: Normal heart sounds.  Pulmonary:     Effort: Pulmonary effort is normal.     Breath sounds: Normal breath sounds.  Chest:     Chest wall: No mass.  Abdominal:     General: Bowel sounds are normal.     Palpations: Abdomen is soft.  Musculoskeletal:        General: Normal range of motion.     Cervical back: Normal range of motion.     Right lower leg: No tenderness. No edema.     Left lower leg: No tenderness. No edema.  Skin:    General: Skin is warm and dry.     Capillary Refill: Capillary refill takes less than 2 seconds.  Neurological:     General: No focal deficit present.     Mental Status: She is alert and oriented to person, place, and time.     Cranial Nerves: No cranial nerve deficit.     Motor: No weakness.  Psychiatric:        Mood and Affect: Mood normal.     ED Results / Procedures / Treatments   Labs (all labs ordered are listed, but only abnormal results are displayed) Labs Reviewed  RESP PANEL BY RT-PCR (FLU A&B, COVID) [id number]  CBC  COMPREHENSIVE METABOLIC PANEL  TROPONIN I (HIGH SENSITIVITY)    EKG EKG Interpretation  Date/Time:  Saturday February 16 2021 10:13:06 EST Ventricular Rate:  66 PR Interval:    QRS Duration: 96 QT Interval:  376 QTC Calculation: 394 R Axis:   67 Text Interpretation: Normal sinus rhythm Normal ECG Confirmed by Pattricia Boss [phone]) on 02/16/2021 11:36:25 AM   Radiology DG Chest Port 1 View  Result Date: 02/16/2021 CLINICAL DATA:  Cough with sob and chest congestion dizziness with some weakness x 2  weeks EXAM: PORTABLE CHEST - 1 VIEW COMPARISON:  02/06/2021 and previous FINDINGS: Lungs are clear. Heart size and mediastinal contours are within normal limits. Stable left pulmonary artery stent. No effusion.  No pneumothorax. Visualized bones unremarkable. IMPRESSION: No acute cardiopulmonary disease. Electronically Signed   By: Lucrezia Europe M.D.   On: 02/16/2021 10:53    Procedures .Critical Care Performed by: Pattricia Boss, MD Authorized by: Pattricia Boss, MD   Critical care provider statement:    Critical care time (minutes):  45   Critical care end time:  02/16/2021 2:00 PM   Critical care was time spent personally by me on the following activities:  Discussions with consultants, evaluation of patient's response to treatment, examination of patient, ordering and performing treatments and interventions, ordering and review of laboratory studies, ordering and review of radiographic studies, pulse oximetry, re-evaluation of patient's condition, obtaining history from patient or surrogate and review of old charts Comments:     Extensive record review and discussion with cardiology at [facility] and repeat labs     Medications Ordered in ED Medications - No data to display  ED Course  I have reviewed the triage vital signs and the nursing notes.  Pertinent labs & imaging results that were available during my care of the patient were reviewed by me and considered in my medical decision making (see chart for details).    MDM Rules/Calculators/A&P                          Atypical chest pain in 45 yo female with history of  autoimmune disease and specifically Takayasu and endothelial cell dysfunction. Here pain is 3/10. EKG is normal Troponin is normal-patient has had cp for days, do not need repeat troponin  CXR normal  Discussed with patient and significant other Paged Dr. Posey Pronto with [facility] to discuss Discussed with Dr. Chanetta Marshall Discussion re need for further  testing now. Patient stable here Troponin and repeat troponin normal No evidence of pain from chest wall, lung, cardiac ischemia Patient advised regarding return precautions and need for close follow up. She is to call Monday     Final Clinical Impression(s) / ED Diagnoses Final diagnoses:  Chest pain, unspecified type    Rx / DC Orders ED Discharge Orders    None       Pattricia Boss, MD 02/16/21 1402

## 2021-02-16 NOTE — ED Notes (Signed)
Contact number at [facility] for consult to cardiology is [phone] transfer center to handle consult at this time Kara Anderson

## 2021-02-16 NOTE — ED Triage Notes (Signed)
Pt presents to ED with complaints of left sided chest pain, pressure, radiating into left arm, nausea, dizziness and headache gradually getting worse over last 2 weeks. Pt has cardiologist at [facility]. Pt increased Imdur on Tuesday and states it took the edge off symptoms but didn't take them away completely.

## 2021-02-16 NOTE — Discharge Instructions (Addendum)
Please call [place] cardiology office Monday at [phone] Continue your increased Imdur Please try to avoid any activity that makes your chest pain worse If chest pain worsens a.m. does not resolve, please return to the emergency department

## 2021-02-19 DIAGNOSIS — Q256 Stenosis of pulmonary artery: Secondary | ICD-10-CM | POA: Diagnosis not present

## 2021-02-19 DIAGNOSIS — R0600 Dyspnea, unspecified: Secondary | ICD-10-CM | POA: Diagnosis not present

## 2021-02-19 DIAGNOSIS — R079 Chest pain, unspecified: Secondary | ICD-10-CM | POA: Diagnosis not present

## 2021-02-19 DIAGNOSIS — I471 Supraventricular tachycardia: Secondary | ICD-10-CM | POA: Diagnosis not present

## 2021-02-22 ENCOUNTER — Other Ambulatory Visit (INDEPENDENT_AMBULATORY_CARE_PROVIDER_SITE_OTHER): Payer: Self-pay | Admitting: Internal Medicine

## 2021-02-22 MED ORDER — LORAZEPAM 0.5 MG PO TABS: 0.5000 mg | ORAL_TABLET | Freq: Two times a day (BID) | ORAL | 1 refills | Status: DC | PRN

## 2021-02-25 ENCOUNTER — Encounter (INDEPENDENT_AMBULATORY_CARE_PROVIDER_SITE_OTHER): Payer: Self-pay | Admitting: Internal Medicine

## 2021-03-11 ENCOUNTER — Other Ambulatory Visit (INDEPENDENT_AMBULATORY_CARE_PROVIDER_SITE_OTHER): Payer: Self-pay | Admitting: Internal Medicine

## 2021-03-11 DIAGNOSIS — R079 Chest pain, unspecified: Secondary | ICD-10-CM

## 2021-03-11 DIAGNOSIS — R0602 Shortness of breath: Secondary | ICD-10-CM

## 2021-03-13 ENCOUNTER — Telehealth: Payer: Self-pay

## 2021-03-13 NOTE — Telephone Encounter (Signed)
Per Dr. Burt Knack, called the patient to arrange New Patient visit.  The patient declines a visit with Dr. Burt Knack, stating she would like a woman who specializes in female cardiac care for treatment of her Takayasu's arteritis.  Scheduled her with Dr. Johney Frame in May 2022 (she was just seen by her [place] specialist a few weeks ago). Reiterated to her she will need to stay established with her [facility]ist and Dr. Johney Frame will be her established care here. She was grateful for call and agrees with plan.

## 2021-03-14 ENCOUNTER — Ambulatory Visit: Payer: BC Managed Care – PPO | Admitting: Cardiology

## 2021-03-14 DIAGNOSIS — Q256 Stenosis of pulmonary artery: Secondary | ICD-10-CM | POA: Diagnosis not present

## 2021-03-14 DIAGNOSIS — I776 Arteritis, unspecified: Secondary | ICD-10-CM | POA: Diagnosis not present

## 2021-03-14 DIAGNOSIS — J9859 Other diseases of mediastinum, not elsewhere classified: Secondary | ICD-10-CM | POA: Diagnosis not present

## 2021-03-28 DIAGNOSIS — Z79899 Other long term (current) drug therapy: Secondary | ICD-10-CM | POA: Diagnosis not present

## 2021-03-28 DIAGNOSIS — I776 Arteritis, unspecified: Secondary | ICD-10-CM | POA: Diagnosis not present

## 2021-03-28 DIAGNOSIS — M1990 Unspecified osteoarthritis, unspecified site: Secondary | ICD-10-CM | POA: Diagnosis not present

## 2021-04-08 ENCOUNTER — Encounter (INDEPENDENT_AMBULATORY_CARE_PROVIDER_SITE_OTHER): Payer: BC Managed Care – PPO | Admitting: Internal Medicine

## 2021-04-14 ENCOUNTER — Other Ambulatory Visit (INDEPENDENT_AMBULATORY_CARE_PROVIDER_SITE_OTHER): Payer: Self-pay | Admitting: Internal Medicine

## 2021-04-18 ENCOUNTER — Ambulatory Visit: Payer: BC Managed Care – PPO | Admitting: Cardiology

## 2021-04-21 NOTE — Progress Notes (Signed)
Cardiology Office Note:    Date:  04/24/2021   ID:  Kara Anderson, DOB 1976/01/07, MRN [id number]  PCP:  Doree Albee, MD   [facility] Providers Cardiologist:  None {  Referring MD: Doree Albee, MD     History of Present Illness:    Kara Anderson is a 45 y.o. 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 Anastasio Champion for further work-up of chest pain.  Has been followed by [facility] with last visit 02/19/21. Has history of Takayasu's arteritis, PA stenosis (08/2018 L. PA PTA) and mediastinal mass (11/2017 excised c/b elevated L. Hemidiaphragm). She has been seen for episodes of chest pain with work-up including TTE, ECG and enzymes were normal. She had a chest CT August 2020 which showed a patent pulmonary artery stent and no significant CAD. Was seen at [facility] and was diagnosed with microvascular dysfunction due to persistent substernal chest pain. Has been on imdur and amlodipine for microvascular dysfunction. She wants a Arts administrator to help manage her medications.  Today, the patient states that she continues to have significant chest pressure and shortness of breath. She is very frustrated about her continued symptoms. She has been taking imdur 60mg  BID, amlodipine 2.5mg  daily and was trialed on a nitro patch which she did not tolerate as her blood pressure dropped significantly. She states that she is used to being very active but has not been able to workout due to her symptoms. She is concerned as her quality of life has been significantly impaired since her diagnosis and she just wants to "feel normal again."  Past Medical History:  Diagnosis Date  . Decreased libido 09/29/2019  . Endometriosis 2007,2009  . HLD (hyperlipidemia) 09/29/2019  . Malaise and fatigue 09/29/2019  . Mediastinal adenopathy   . Mononucleosis   . Seasonal allergies   . Strep throat   . Takayasu's  arteritis ([place])   . Thyroid disease   . Viral meningitis   . Vitamin D deficiency disease 09/29/2019    Past Surgical History:  Procedure Laterality Date  . HIP SURGERY  2013  . IR THORACENTESIS ASP PLEURAL SPACE W/IMG GUIDE  11/20/2017  . LAPAROSCOPIC ABDOMINAL EXPLORATION  2007,2009   endometriosis  . MEDIASTINOTOMY CHAMBERLAIN MCNEIL Left 11/16/2017   Procedure: LEFT ANTERIOR MEDIASTINOTOMY CHAMBERLAIN PROCEDURE;  Surgeon: [name], MD;  Location: [place];  Service: Thoracic;  Laterality: Left;  . NOSE SURGERY  2007    Current Medications: Current Meds  Medication Sig  . amLODipine (NORVASC) 5 MG tablet Take 1 tablet (5 mg total) by mouth daily.  . Cholecalciferol (VITAMIN D-3) 125 MCG (5000 UT) TABS Take 10,000 Units by mouth daily.  Marland Kitchen co-enzyme Q-10 30 MG capsule Take 30 mg by mouth daily.  . folic acid (FOLVITE) 0.5 MG tablet Take 1 mg by mouth daily.  . isosorbide mononitrate (IMDUR) 60 MG 24 hr tablet Take 120 mg by mouth 2 (two) times daily.  Marland Kitchen LORazepam (ATIVAN) 0.5 MG tablet Take 1 tablet (0.5 mg total) by mouth 2 (two) times daily as needed for anxiety.  . Methotrexate 25 MG/ML SOSY Inject 25 mg into the skin once a week. Mondays.  . Multiple Vitamins-Minerals (MULTIVITAMIN ADULT) CHEW Chew 2 tablets by mouth daily.  Marland Kitchen NITRO-DUR 0.3 MG/HR patch Place onto the skin.  Marland Kitchen nitroGLYCERIN (NITROSTAT) 0.4 MG SL tablet Place 1 tablet (0.4 mg total) under the  tongue every 5 (five) minutes as needed for chest pain.  . ranolazine (RANEXA) 500 MG 12 hr tablet Take 1 tablet (500 mg total) by mouth 2 (two) times daily.  . Testosterone 20 % CREA Apply 15 mg topically daily.  . Turmeric 500 MG CAPS Take 1,000 mg by mouth daily.  . vitamin B-12 (CYANOCOBALAMIN) 1000 MCG tablet Take 3,000 mcg by mouth daily.  . [DISCONTINUED] amLODipine (NORVASC) 2.5 MG tablet Take 2.5 mg by mouth daily.     Allergies:   Codeine   Social History   Socioeconomic History  . Marital  status: Married    Spouse name: Not on file  . Number of children: Not on file  . Years of education: Not on file  . Highest education level: Not on file  Occupational History  . Not on file  Tobacco Use  . Smoking status: Never Smoker  . Smokeless tobacco: Never Used  Vaping Use  . Vaping Use: Never used  Substance and Sexual Activity  . Alcohol use: No    Alcohol/week: 0.0 standard drinks  . Drug use: No  . Sexual activity: Yes    Birth control/protection: None  Other Topics Concern  . 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   Social Determinants of Health   Financial Resource Strain: Not on file  Food Insecurity: Not on file  Transportation Needs: Not on file  Physical Activity: Not on file  Stress: Not on file  Social Connections: Not on file     Family History: The patient's family history includes Alcohol abuse in her father; Diabetes in her mother; Heart disease in her father and mother; Stroke in her mother.  ROS:   Please see the history of present illness.    Review of Systems  Constitutional: Positive for malaise/fatigue. Negative for chills and fever.  HENT: Negative for hearing loss.   Eyes: Negative for blurred vision and redness.  Respiratory: Negative for shortness of breath.   Cardiovascular: Positive for chest pain. Negative for palpitations, orthopnea, claudication, leg swelling and PND.  Gastrointestinal: Positive for nausea. Negative for melena.  Genitourinary: Negative for flank pain.  Musculoskeletal: Negative for falls.  Neurological: Positive for dizziness. Negative for loss of consciousness.  Endo/Heme/Allergies: Negative for polydipsia.  Psychiatric/Behavioral: Negative for substance abuse.    EKGs/Labs/Other Studies Reviewed:    The following studies were reviewed today: TTE January 31, 2020: 1. Left ventricular ejection fraction, by visual estimation, is  55 to  60%. The left ventricle has normal function. There is no left ventricular  hypertrophy.  2. The left ventricle has no regional wall motion abnormalities.  3. Global right ventricle has normal systolic function.The right  ventricular size is normal. No increase in right ventricular wall  thickness.  4. Left atrial size was normal.  5. Right atrial size was normal.  6. The mitral valve is normal in structure. Mild mitral valve  regurgitation. No evidence of mitral stenosis.  7. The tricuspid valve is normal in structure.  8. The tricuspid valve is normal in structure. Tricuspid valve  regurgitation is mild.  9. The aortic valve is normal in structure. Aortic valve regurgitation is  not visualized. No evidence of aortic valve sclerosis or stenosis.  10. The pulmonic valve was normal in structure. Pulmonic valve  regurgitation is not visualized.  11. Normal pulmonary artery systolic pressure.  12. The tricuspid regurgitant velocity is 1.66 m/s, and with  an assumed  right atrial pressure of 3 mmHg, the estimated right ventricular systolic  pressure is normal at 14.0 mmHg.  13. The inferior vena cava is normal in size with greater than 50%  respiratory variability, suggesting right atrial pressure of 3 mmHg.  14. The average left ventricular global longitudinal strain is 21.2 %.  Carotid ultrasound 10/2017: Final Interpretation:  Right Carotid: There was no evidence of thrombus, dissection,  atherosclerotic         plaque or stenosis in the cervical carotid system.         Imaging over the aortic arch shows what appears to be color         turbulence near the pulmonary artery. Short axis and  subcostal         views of the heart revealed peak CW gradient of the  pulmonic         artery in the range of 40-54 mmHg. Pulmonic insufficiency  also         noted.   Left Carotid: There was no evidence of thrombus, dissection,   atherosclerotic        plaque or stenosis in the cervical carotid system.   Vertebrals: Both vertebral arteries were patent with antegrade flow.  Subclavians: Normal flow hemodynamics were seen in bilateral subclavian        arteries.        There is also a mobile linear structure in the right  subclavian        vein Discussed        with lead tech who thought structure was a valve but appears  longer        than usual  EKG:  EKG is  ordered today.  The ekg ordered today demonstrates 02/18/21 with NSR, rsr' pattern, HR 66.  Recent Labs: 02/16/2021: ALT 12; BUN 13; Creatinine, Ser 0.63; Hemoglobin 13.5; Platelets 266; Potassium 3.9; Sodium 136  Recent Lipid Panel No results found for: CHOL, TRIG, HDL, CHOLHDL, VLDL, LDLCALC, LDLDIRECT    Physical Exam:    VS:  BP 110/70   Pulse 71   Ht 5\' 4"  (1.626 m)   Wt 145 lb (65.8 kg)   SpO2 98%   BMI 24.89 kg/m     Wt Readings from Last 3 Encounters:  04/24/21 145 lb (65.8 kg)  02/16/21 145 lb 8.1 oz (66 kg)  01/21/21 145 lb 6.4 oz (66 kg)     GEN:  Well nourished, well developed in no acute distress HEENT: Normal NECK: No JVD; No carotid bruits CARDIAC: RRR, no murmurs, rubs, gallops RESPIRATORY:  Clear to auscultation without rales, wheezing or rhonchi  ABDOMEN: Soft, non-tender, non-distended MUSCULOSKELETAL:  No edema; No deformity  SKIN: Warm and dry NEUROLOGIC:  Alert and oriented x 3 PSYCHIATRIC:  Normal affect   ASSESSMENT:    1. Microvascular angina ([place])   2. Takayasu's arteritis ([place])   3. Pulmonary artery stenosis    PLAN:    In order of problems listed above:  #Microvascular Dysfunction with Angina: Patient was diagnosed with microvascular dysfunction at [facility] clinic. Has been on imdur 60mg  BID and amlodipine 2.5mg  daily with some improvement of symptoms but continues to have episodes of severe substernal chest pain that significantly limits her activity. She  trialed nitro patches but her blood pressures dropped significantly as her normal resting BP is in the 90s. She is very frustrated as her condition has significantly impacted her quality of life. We spent a significant amount  of time discussing how she may continue to experience flares of her condition but our goal is to maximize the number of good days and minimize the number of bad days to improve her overall quality of life. -Increase amlodipine to 5mg  daily -Continue imdur 60mg  BID -Start ranexa 500mg  BID -Can take SL NTG as needed for severe breakthrough symptoms -Encouraged her to stay very hydrated and drink electrolyte replacement drinks (such as gatorade/poweraide zero) as well as to liberalize her salt intake to help keep her blood pressure up -Will see back in 3 months  #Pulmonary artery stenosis s/p stenting 09/09/2018: -06/2018 Echo: EF >55%, LAE 3.6 cm, mild PR, dilated PA and IVC, increased velocities through pulmonary artery. Peak MPA velocity 2.74M/S, peak gradient 24 mmHg  -06/2018 Chest MRI: Stenosis of the right main and left main pulmonary arteries measuring 9 and 8 mm, respectively, most likely related to reported prior episode of Takayasu's arteritis. Detection of active vasculitic inflammation is limited by MR angiogram due to significant motion artifact. Scattered left lower lobe nodules may represent atelectasis versus aspiration. August 20, 2018 RHC: RA mean 7 mmHg, RV 33/6 mmHg, PA 32/18 mean 17, PWP - 8 mmHg, Index 4.6 PTA of her left pulmonary artery 08/2018 Echo: EF >55%, dilated PA -Followed by [facility]; has been stable  -Continue ASA 81mg  daily -Continue imdur 120mg  BID -Cath at [facility] in 2020 with no significant CAD  #Takayatsu Arteritis: Followed by Rheum. On MTX. -Follow-up with rheum as scheduled -Continue MTX  Total time of encounter: 60 minutes total time of encounter, including 45 minutes spent in face-to-face patient care on the date of this encounter. This  time includes coordination of care and counseling regarding above mentioned problem list. Remainder of non-face-to-face time involved reviewing chart documents/testing relevant to the patient encounter and documentation in the medical record. I have independently reviewed documentation from referring provider.   Gwyndolyn Kaufman, MD   [facility]   Medication Adjustments/Labs and Tests Ordered: Current medicines are reviewed at length with the patient today.  Concerns regarding medicines are outlined above.  No orders of the defined types were placed in this encounter.  Meds ordered this encounter  Medications  . amLODipine (NORVASC) 5 MG tablet    Sig: Take 1 tablet (5 mg total) by mouth daily.    Dispense:  90 tablet    Refill:  2    Dose increase  . nitroGLYCERIN (NITROSTAT) 0.4 MG SL tablet    Sig: Place 1 tablet (0.4 mg total) under the tongue every 5 (five) minutes as needed for chest pain.    Dispense:  25 tablet    Refill:  3  . ranolazine (RANEXA) 500 MG 12 hr tablet    Sig: Take 1 tablet (500 mg total) by mouth 2 (two) times daily.    Dispense:  180 tablet    Refill:  1    Patient Instructions  Medication Instructions:   INCREASE YOUR AMLODIPINE TO 5 MG BY MOUTH DAILY  START TAKING RANOLAZINE (RANEXA) 500 MG BY MOUTH TWICE DAILY  DR. Johney Frame HAS PRESCRIBED YOU SUBLINGUAL (UNDER THE TONGUE) NITROGLYCERIN 0.4 MG-  PLEASE PLACE ONE TABLET UNDER THE TONGUE EVERY 5 MINS AS NEEDED FOR CHEST PAIN-NO MORE THAN 3 TABLETS--PLEASE DO NOT USE YOUR NITRO PATCH AND THIS MEDICATION TOGETHER.  *If you need a refill on your cardiac medications before your next appointment, please call your pharmacy*   Follow-Up:  3 MONTHS IN THE OFFICE WITH DR. Remi Haggard USE ANY  OPEN SLOT       Signed, Freada Bergeron, MD  04/24/2021 1:58 PM    [facility]

## 2021-04-24 ENCOUNTER — Other Ambulatory Visit: Payer: Self-pay

## 2021-04-24 ENCOUNTER — Encounter: Payer: Self-pay | Admitting: Cardiology

## 2021-04-24 ENCOUNTER — Ambulatory Visit: Payer: BC Managed Care – PPO | Admitting: Cardiology

## 2021-04-24 VITALS — BP 110/70 | HR 71 | Ht 64.0 in | Wt 145.0 lb

## 2021-04-24 DIAGNOSIS — Q256 Stenosis of pulmonary artery: Secondary | ICD-10-CM

## 2021-04-24 DIAGNOSIS — M314 Aortic arch syndrome [Takayasu]: Secondary | ICD-10-CM

## 2021-04-24 DIAGNOSIS — I208 Other forms of angina pectoris: Secondary | ICD-10-CM

## 2021-04-24 MED ORDER — RANOLAZINE ER 500 MG PO TB12: 500.0000 mg | ORAL_TABLET | Freq: Two times a day (BID) | ORAL | 1 refills | Status: DC

## 2021-04-24 MED ORDER — NITROGLYCERIN 0.4 MG SL SUBL
0.4000 mg | SUBLINGUAL_TABLET | SUBLINGUAL | 3 refills | Status: DC | PRN
Start: 2021-04-24 — End: 2022-06-27

## 2021-04-24 MED ORDER — AMLODIPINE BESYLATE 5 MG PO TABS: 5.0000 mg | ORAL_TABLET | Freq: Every day | ORAL | 2 refills | Status: DC

## 2021-04-24 NOTE — Patient Instructions (Signed)
Medication Instructions:   INCREASE YOUR AMLODIPINE TO 5 MG BY MOUTH DAILY  START TAKING RANOLAZINE (RANEXA) 500 MG BY MOUTH TWICE DAILY  DR. Johney Frame HAS PRESCRIBED YOU SUBLINGUAL (UNDER THE TONGUE) NITROGLYCERIN 0.4 MG-  PLEASE PLACE ONE TABLET UNDER THE TONGUE EVERY 5 MINS AS NEEDED FOR CHEST PAIN-NO MORE THAN 3 TABLETS--PLEASE DO NOT USE YOUR NITRO PATCH AND THIS MEDICATION TOGETHER.  *If you need a refill on your cardiac medications before your next appointment, please call your pharmacy*   Follow-Up:  3 MONTHS IN THE OFFICE WITH DR. Remi Haggard USE ANY OPEN SLOT

## 2021-05-03 ENCOUNTER — Other Ambulatory Visit: Payer: Self-pay | Admitting: Cardiology

## 2021-05-03 MED ORDER — RANOLAZINE ER 500 MG PO TB12: 1000.0000 mg | ORAL_TABLET | Freq: Two times a day (BID) | ORAL | 3 refills | Status: DC

## 2021-05-09 ENCOUNTER — Other Ambulatory Visit: Payer: Self-pay

## 2021-05-09 ENCOUNTER — Encounter (INDEPENDENT_AMBULATORY_CARE_PROVIDER_SITE_OTHER): Payer: Self-pay | Admitting: Internal Medicine

## 2021-05-09 ENCOUNTER — Ambulatory Visit (INDEPENDENT_AMBULATORY_CARE_PROVIDER_SITE_OTHER): Payer: BC Managed Care – PPO | Admitting: Internal Medicine

## 2021-05-09 VITALS — BP 104/72 | HR 72 | Temp 97.5°F | Ht 63.0 in | Wt 147.6 lb

## 2021-05-09 DIAGNOSIS — R5381 Other malaise: Secondary | ICD-10-CM

## 2021-05-09 DIAGNOSIS — Z13220 Encounter for screening for lipoid disorders: Secondary | ICD-10-CM

## 2021-05-09 DIAGNOSIS — Z131 Encounter for screening for diabetes mellitus: Secondary | ICD-10-CM | POA: Diagnosis not present

## 2021-05-09 DIAGNOSIS — E559 Vitamin D deficiency, unspecified: Secondary | ICD-10-CM | POA: Diagnosis not present

## 2021-05-09 DIAGNOSIS — Z0001 Encounter for general adult medical examination with abnormal findings: Secondary | ICD-10-CM

## 2021-05-09 DIAGNOSIS — R5383 Other fatigue: Secondary | ICD-10-CM | POA: Diagnosis not present

## 2021-05-09 NOTE — Progress Notes (Signed)
Chief Complaint: [name] comes in for an annual physical exam. HPI: She has seen a new cardiologist and is extremely happy with the care received.  Overall, she feels reasonably okay but clearly not able to do all the things she was able to do before her diagnosis of Takayasu's arteritis.  Medications have been adjusted. She continues to take testosterone therapy as before but has discontinued thyroid because of side effects.  Past Medical History:  Diagnosis Date  . Decreased libido 09/29/2019  . Endometriosis 2007,2009  . HLD (hyperlipidemia) 09/29/2019  . Malaise and fatigue 09/29/2019  . Mediastinal adenopathy   . Mononucleosis   . Seasonal allergies   . Strep throat   . Takayasu's arteritis ([place])   . Thyroid disease   . Viral meningitis   . Vitamin D deficiency disease 09/29/2019   Past Surgical History:  Procedure Laterality Date  . HIP SURGERY  2013  . IR THORACENTESIS ASP PLEURAL SPACE W/IMG GUIDE  11/20/2017  . LAPAROSCOPIC ABDOMINAL EXPLORATION  2007,2009   endometriosis  . MEDIASTINOTOMY CHAMBERLAIN MCNEIL Left 11/16/2017   Procedure: LEFT ANTERIOR MEDIASTINOTOMY CHAMBERLAIN PROCEDURE;  Surgeon: [name], MD;  Location: [place];  Service: Thoracic;  Laterality: Left;  . 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    Social History   Tobacco Use  . Smoking status: Never Smoker  . Smokeless tobacco: Never Used  Substance Use Topics  . Alcohol use: No    Alcohol/week: 0.0 standard drinks      Allergies:  Allergies  Allergen Reactions  . Codeine Nausea Only     Current Meds  Medication Sig  . Cholecalciferol (VITAMIN D-3) 125 MCG (5000 UT) TABS Take 10,000 Units by mouth daily.  Marland Kitchen co-enzyme Q-10 30 MG capsule Take 30 mg by mouth daily.  Marland Kitchen EC-RX Testosterone 0.2 % CREA Place 5 mg onto the skin daily. 2%  CREAM COMPOUNDED  . folic acid (FOLVITE) 0.5 MG tablet Take 1 mg by mouth daily.  Marland Kitchen gabapentin (NEURONTIN) 300 MG capsule Take 300 mg by mouth at bedtime. May be able to go up to TID if needed  . isosorbide mononitrate (IMDUR) 120 MG 24 hr tablet Take 120 mg by mouth 2 (two) times daily.  . isosorbide mononitrate (IMDUR) 120 MG 24 hr tablet Take 120 mg by mouth in the morning and at bedtime.  Marland Kitchen LORazepam (ATIVAN) 0.5 MG tablet Take 1 tablet (0.5 mg total) by mouth 2 (two) times daily as needed for anxiety.  . Methotrexate 25 MG/ML SOSY Inject 25 mg into the skin once a week. Mondays.  . Multiple Vitamins-Minerals (MULTIVITAMIN ADULT) CHEW Chew 2 tablets by mouth daily.  . nitroGLYCERIN (NITROSTAT) 0.4 MG SL tablet Place 1 tablet (0.4 mg total) under the tongue every 5 (five) minutes as needed for chest pain.  . ranolazine (RANEXA) 500 MG 12 hr tablet Take 2 tablets (1,000 mg total) by mouth 2 (two) times daily.  . Turmeric 500 MG CAPS Take 1,000 mg by mouth daily.  . vitamin B-12 (CYANOCOBALAMIN) 1000 MCG tablet Take 3,000 mcg by mouth daily.  . [DISCONTINUED] isosorbide mononitrate (IMDUR) 60 MG 24 hr tablet Take 120 mg by mouth 2 (two) times daily.  . [DISCONTINUED] Testosterone 20 % CREA Apply 15 mg topically daily.     [place] Office Visit from 05/09/2021 in [facility]  PHQ-9 Total Score 0      OBS:JGGEZ from the symptoms mentioned above,there are no other symptoms referable to all systems reviewed.       Physical Exam:CMA Chaperone present Blood pressure 104/72, pulse 72, temperature (!) 97.5 F (36.4 Kara), temperature source Temporal, height 5\' 3"  (1.6 m), weight 147 lb 9.6 oz (67 kg), SpO2 95 %. Vitals with BMI 05/09/2021 04/24/2021 02/16/2021  Height 5\' 3"  5\' 4"  -  Weight 147 lbs 10 oz 145 lbs -  BMI 66.29 47.65 -  Systolic 465 035 465  Diastolic 72 70 70  Pulse 72 71 63      [name]. General: Alert, cooperative, and appears to be the  stated age.No pallor.  No jaundice.  No clubbing. Head: Normocephalic Eyes: Sclera white, pupils equal and reactive to light, red reflex x 2,  Ears: Normal bilaterally Oral cavity: Lips, mucosa, and tongue normal: Teeth and gums normal Neck: No adenopathy, supple, symmetrical, trachea midline, and thyroid does not appear enlarged. Breast: No masses felt although the breasts are lumpy. Respiratory: Clear to auscultation bilaterally.No wheezing, crackles or bronchial breathing. Cardiovascular: Heart sounds are present and appear to be normal without murmurs or added sounds.  No carotid bruits.  Peripheral pulses are present and equal bilaterally.: Gastrointestinal:positive bowel sounds, no hepatosplenomegaly.  No masses felt.No tenderness. Skin: Clear, No rashes noted.No worrisome skin lesions seen. Neurological: Grossly intact without focal findings, cranial nerves II through XII intact, muscle strength equal bilaterally Musculoskeletal: No acute joint abnormalities noted.Full range of movement noted with joints. Psychiatric: Affect appropriate, non-anxious.    Assessment  1. Malaise and fatigue   2. Vitamin D deficiency disease   3. Encounter for general adult medical examination with abnormal findings   4. Screening for diabetes mellitus   5. Screening for lipoid disorders     Tests Ordered:   Orders Placed This Encounter  Procedures  . CBC  . COMPLETE METABOLIC PANEL WITH GFR  . Hemoglobin A1c  . Lipid panel  . T3, free  . T4, free  . TSH  . VITAMIN D 25 Hydroxy (Vit-D Deficiency, Fractures)     Plan  1. Relatively healthy 45 year old lady but functionally she has had a significant decline since diagnosis of  Takayasu's arteritis.  I think she should definitely qualify for disability based on her functional decline. 2. Blood work is ordered. 3. Further recommendations will depend on blood results and I will see her in 6 months time for follow-up.     No orders of  the defined types were placed in this encounter.    Kara Anderson Kara Anderson   05/09/2021, 10:53 AM

## 2021-05-10 ENCOUNTER — Encounter (INDEPENDENT_AMBULATORY_CARE_PROVIDER_SITE_OTHER): Payer: Self-pay | Admitting: Internal Medicine

## 2021-05-10 LAB — CBC
HCT: 37.4 % (ref 35.0–45.0)
Hemoglobin: 12.9 g/dL (ref 11.7–15.5)
MCH: 33.9 pg — ABNORMAL HIGH (ref 27.0–33.0)
MCHC: 34.5 g/dL (ref 32.0–36.0)
MCV: 98.4 fL (ref 80.0–100.0)
MPV: 10.4 fL (ref 7.5–12.5)
Platelets: 273 10*3/uL (ref 140–400)
RBC: 3.8 10*6/uL (ref 3.80–5.10)
RDW: 12.5 % (ref 11.0–15.0)
WBC: 5.4 10*3/uL (ref 3.8–10.8)

## 2021-05-10 LAB — COMPLETE METABOLIC PANEL WITH GFR
AG Ratio: 2 (calc) (ref 1.0–2.5)
ALT: 10 U/L (ref 6–29)
AST: 13 U/L (ref 10–30)
Albumin: 4.5 g/dL (ref 3.6–5.1)
Alkaline phosphatase (APISO): 35 U/L (ref 31–125)
BUN: 15 mg/dL (ref 7–25)
CO2: 26 mmol/L (ref 20–32)
Calcium: 9.3 mg/dL (ref 8.6–10.2)
Chloride: 97 mmol/L — ABNORMAL LOW (ref 98–110)
Creat: 0.81 mg/dL (ref 0.50–1.10)
GFR, Est African American: 102 mL/min/{1.73_m2} (ref >=60)
GFR, Est Non African American: 88 mL/min/{1.73_m2} (ref >=60)
Globulin: 2.3 g/dL (calc) (ref 1.9–3.7)
Glucose, Bld: 93 mg/dL (ref 65–139)
Potassium: 4.8 mmol/L (ref 3.5–5.3)
Sodium: 132 mmol/L — ABNORMAL LOW (ref 135–146)
Total Bilirubin: 0.7 mg/dL (ref 0.2–1.2)
Total Protein: 6.8 g/dL (ref 6.1–8.1)

## 2021-05-10 LAB — HEMOGLOBIN A1C
Hgb A1c MFr Bld: 4.8 % of total Hgb (ref <5.7)
Mean Plasma Glucose: 91 mg/dL
eAG (mmol/L): 5 mmol/L

## 2021-05-10 LAB — LIPID PANEL
Cholesterol: 261 mg/dL — ABNORMAL HIGH (ref <200)
HDL: 69 mg/dL (ref >=50)
LDL Cholesterol (Calc): 165 mg/dL (calc) — ABNORMAL HIGH
Non-HDL Cholesterol (Calc): 192 mg/dL (calc) — ABNORMAL HIGH (ref <130)
Total CHOL/HDL Ratio: 3.8 (calc) (ref <5.0)
Triglycerides: 137 mg/dL (ref <150)

## 2021-05-10 LAB — T4, FREE: Free T4: 1.2 ng/dL (ref 0.8–1.8)

## 2021-05-10 LAB — T3, FREE: T3, Free: 3.1 pg/mL (ref 2.3–4.2)

## 2021-05-10 LAB — VITAMIN D 25 HYDROXY (VIT D DEFICIENCY, FRACTURES): Vit D, 25-Hydroxy: 100 ng/mL (ref 30–100)

## 2021-05-10 LAB — TSH: TSH: 2.09 mIU/L

## 2021-05-14 ENCOUNTER — Encounter (INDEPENDENT_AMBULATORY_CARE_PROVIDER_SITE_OTHER): Payer: Self-pay | Admitting: Internal Medicine

## 2021-05-20 DIAGNOSIS — Z79899 Other long term (current) drug therapy: Secondary | ICD-10-CM | POA: Diagnosis not present

## 2021-05-20 DIAGNOSIS — I776 Arteritis, unspecified: Secondary | ICD-10-CM | POA: Diagnosis not present

## 2021-05-22 ENCOUNTER — Other Ambulatory Visit (INDEPENDENT_AMBULATORY_CARE_PROVIDER_SITE_OTHER): Payer: Self-pay

## 2021-05-23 ENCOUNTER — Telehealth (INDEPENDENT_AMBULATORY_CARE_PROVIDER_SITE_OTHER): Payer: Self-pay

## 2021-05-23 ENCOUNTER — Other Ambulatory Visit (INDEPENDENT_AMBULATORY_CARE_PROVIDER_SITE_OTHER): Payer: Self-pay | Admitting: Internal Medicine

## 2021-05-23 MED ORDER — EC-RX TESTOSTERONE 0.2 % TD CREA: TOPICAL_CREAM | TRANSDERMAL | 2 refills | Status: DC

## 2021-05-23 NOTE — Telephone Encounter (Signed)
Kara Anderson from [place] called and left a voice message to discuss the recent prescription sent to them. [phone]

## 2021-05-29 ENCOUNTER — Encounter (INDEPENDENT_AMBULATORY_CARE_PROVIDER_SITE_OTHER): Payer: Self-pay | Admitting: Internal Medicine

## 2021-05-29 ENCOUNTER — Other Ambulatory Visit (INDEPENDENT_AMBULATORY_CARE_PROVIDER_SITE_OTHER): Payer: Self-pay | Admitting: Internal Medicine

## 2021-05-29 ENCOUNTER — Other Ambulatory Visit: Payer: Self-pay | Admitting: Internal Medicine

## 2021-05-29 ENCOUNTER — Other Ambulatory Visit (INDEPENDENT_AMBULATORY_CARE_PROVIDER_SITE_OTHER): Payer: Self-pay

## 2021-05-29 ENCOUNTER — Other Ambulatory Visit: Payer: Self-pay

## 2021-05-29 ENCOUNTER — Ambulatory Visit (INDEPENDENT_AMBULATORY_CARE_PROVIDER_SITE_OTHER): Payer: BC Managed Care – PPO

## 2021-05-29 VITALS — Resp 17 | Ht 63.0 in | Wt 147.0 lb

## 2021-05-29 DIAGNOSIS — Z1211 Encounter for screening for malignant neoplasm of colon: Secondary | ICD-10-CM

## 2021-05-29 DIAGNOSIS — M1990 Unspecified osteoarthritis, unspecified site: Secondary | ICD-10-CM

## 2021-05-29 DIAGNOSIS — M314 Aortic arch syndrome [Takayasu]: Secondary | ICD-10-CM

## 2021-05-29 DIAGNOSIS — Z1231 Encounter for screening mammogram for malignant neoplasm of breast: Secondary | ICD-10-CM

## 2021-05-29 DIAGNOSIS — I776 Arteritis, unspecified: Secondary | ICD-10-CM

## 2021-05-29 MED ORDER — METHOTREXATE SODIUM CHEMO INJECTION 50 MG/2ML: 50.0000 mg/m2 | Freq: Once | INTRAMUSCULAR | 0 refills | Status: AC

## 2021-05-29 NOTE — Progress Notes (Unsigned)
After obtaining consent, and per orders of Dr. Anastasio Champion, injection of Methotrexate 50mg /2mL IM to the left thigh given by [employer]. Pt tolerated well;applied band-aid. Patient instructed to remain in clinic for 10 minutes afterwards, and to report any adverse reaction to me immediately.

## 2021-05-29 NOTE — Progress Notes (Signed)
.  After obtaining consent, and per orders of Dr. Anastasio Champion, injection of Methotrexate 50 mg/ 1 mL given to left thigh by [employer] .Patient instructed to remain in clinic for 20 minutes afterwards, and to report any adverse reaction to me immediately.

## 2021-06-03 ENCOUNTER — Encounter: Payer: Self-pay | Admitting: Internal Medicine

## 2021-06-06 MED ORDER — NITROGLYCERIN 0.2 MG/HR TD PT24: 0.2000 mg | MEDICATED_PATCH | Freq: Every day | TRANSDERMAL | 11 refills | Status: DC

## 2021-06-24 DIAGNOSIS — Z79899 Other long term (current) drug therapy: Secondary | ICD-10-CM | POA: Diagnosis not present

## 2021-06-24 DIAGNOSIS — M0600 Rheumatoid arthritis without rheumatoid factor, unspecified site: Secondary | ICD-10-CM | POA: Diagnosis not present

## 2021-06-24 DIAGNOSIS — I776 Arteritis, unspecified: Secondary | ICD-10-CM | POA: Diagnosis not present

## 2021-07-05 ENCOUNTER — Other Ambulatory Visit: Payer: Self-pay | Admitting: Cardiology

## 2021-07-05 MED ORDER — NITROGLYCERIN 0.4 MG/HR TD PT24: 0.4000 mg | MEDICATED_PATCH | Freq: Every day | TRANSDERMAL | 12 refills | Status: DC

## 2021-07-08 ENCOUNTER — Ambulatory Visit: Payer: BC Managed Care – PPO | Admitting: Cardiology

## 2021-07-08 ENCOUNTER — Other Ambulatory Visit: Payer: Self-pay

## 2021-07-08 VITALS — BP 120/80 | HR 68 | Ht 63.0 in | Wt 145.0 lb

## 2021-07-08 DIAGNOSIS — Q256 Stenosis of pulmonary artery: Secondary | ICD-10-CM

## 2021-07-08 DIAGNOSIS — M314 Aortic arch syndrome [Takayasu]: Secondary | ICD-10-CM

## 2021-07-08 DIAGNOSIS — I208 Other forms of angina pectoris: Secondary | ICD-10-CM | POA: Diagnosis not present

## 2021-07-08 NOTE — Patient Instructions (Signed)
Medication Instructions:   Your physician recommends that you continue on your current medications as directed. Please refer to the Current Medication list given to you today.  *If you need a refill on your cardiac medications before your next appointment, please call your pharmacy*   Follow-Up:  3-4 MONTHS IN THE OFFICE WITH AN EXTENDER

## 2021-07-08 NOTE — Progress Notes (Signed)
Cardiology Office Note:    Date:  07/08/2021   ID:  Kara Anderson, DOB [birth year], MRN [id number]  PCP:  Doree Albee, MD   [facility] Providers Cardiologist:  None {  Referring MD: Doree Albee, MD     History of Present Illness:    Kara Anderson is a 45 y.o. 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  Has been followed by [facility] with last visit 02/19/21. Has history of Takayasu's arteritis, PA stenosis (08/2018 L. PA PTA) and mediastinal mass (11/2017 excised c/b elevated L. Hemidiaphragm). She has been seen for episodes of chest pain with work-up including TTE, ECG and enzymes were normal. She had a chest CT August 2020 which showed a patent pulmonary artery stent and no significant CAD. Was seen at [facility] and was diagnosed with microvascular dysfunction due to persistent substernal chest pain. Has been on imdur and amlodipine for microvascular dysfunction. She wants a Arts administrator to help manage her medications.  Last seen in clinic on 04/24/21 where she was complaining of significant chest pressure and SOB in the setting of known microvascular dysfunction. Was also having episodes of hypotension with the amlodipine. Since that time, we have made multiple adjustments to her meds to try and improve symptoms. Currently taking imdur '120mg'$  daily, nitro patch 0.4, nitro prn, ranolazine '1000mg'$  BID.   Today, the patient states she overall feels better with the multiple medication adjustments we have made over the past several months. Continues to have intermittent chest pressure and SOB most notably with exertion and with the heat. She continues to take nitro prn for breakthrough symptoms with improvement. Blood pressure has been stable with hydration and electrolyte repletion and she is tolerating the imdur and nitro patch.  She is interested in discussing other options in case the current regiment stopped working.     Of note, her mom was recently diagnosed with brain tumor, which has been very difficult to deal with.  She is planned to go to [place] to see her mom later tomorrow.  Past Medical History:  Diagnosis Date   Decreased libido 09/29/2019   Endometriosis 2007,2009   HLD (hyperlipidemia) 09/29/2019   Malaise and fatigue 09/29/2019   Mediastinal adenopathy    Mononucleosis    Seasonal allergies    Strep throat    Takayasu's arteritis ([place])    Thyroid disease    Viral meningitis    Vitamin D deficiency disease 09/29/2019    Past Surgical History:  Procedure Laterality Date   HIP SURGERY  2013   IR THORACENTESIS ASP PLEURAL SPACE W/IMG GUIDE  11/20/2017   LAPAROSCOPIC ABDOMINAL EXPLORATION  2007,2009   endometriosis   MEDIASTINOTOMY CHAMBERLAIN MCNEIL    Left 11/16/2017   Procedure: LEFT ANTERIOR MEDIASTINOTOMY CHAMBERLAIN PROCEDURE;  Surgeon: [name], MD;  Location: [facility];  Service: Thoracic;  Laterality: Left;   NOSE SURGERY  2007    Current Medications: Current Meds  Medication Sig   Cholecalciferol (VITAMIN D-3) 125 MCG (5000 UT) TABS Take 10,000 Units by mouth daily.   co-enzyme Q-10 30 MG capsule Take 30 mg by mouth daily.   EC-RX Testosterone 0.2 % CREA Place 15 mg onto the skin daily for 90 days, THEN 5 mg daily. 6% CREAM COMPOUNDED.   folic acid (FOLVITE) 0.5 MG tablet Take 1 mg by mouth daily.   gabapentin (  NEURONTIN) 300 MG capsule Take 300 mg by mouth at bedtime. May be able to go up to TID if needed   isosorbide mononitrate (IMDUR) 120 MG 24 hr tablet Take 120 mg by mouth 2 (two) times daily.   LORazepam (ATIVAN) 0.5 MG tablet Take 1 tablet (0.5 mg total) by mouth 2 (two) times daily as needed for anxiety.   Methotrexate 25 MG/ML SOSY Inject 25 mg into the skin once a week. Mondays.   Multiple Vitamins-Minerals (MULTIVITAMIN ADULT) CHEW Chew 2 tablets by mouth daily.    nitroGLYCERIN (NITRO-DUR) 0.4 mg/hr patch Place 1 patch (0.4 mg total) onto the skin daily.   nitroGLYCERIN (NITROSTAT) 0.4 MG SL tablet Place 1 tablet (0.4 mg total) under the tongue every 5 (five) minutes as needed for chest pain.   ranolazine (RANEXA) 500 MG 12 hr tablet Take 2 tablets (1,000 mg total) by mouth 2 (two) times daily.   Red Yeast Rice Extract (RED YEAST RICE PO) Take 600 mg by mouth 2 (two) times daily.   Turmeric 500 MG CAPS Take 1,000 mg by mouth daily.   vitamin B-12 (CYANOCOBALAMIN) 1000 MCG tablet Take 3,000 mcg by mouth daily.     Allergies:   Codeine   Social History   Socioeconomic History   Marital status: Married    Spouse name: Not on file   Number of children: Not on file   Years of education: Not on file   Highest education level: Not on file  Occupational History   Not on file  Tobacco Use   Smoking status: Never   Smokeless tobacco: Never  Vaping Use   Vaping Use: Never used  Substance and Sexual Activity   Alcohol use: No    Alcohol/week: 0.0 standard drinks   Drug use: No   Sexual activity: Yes    Birth control/protection: None  Other Topics Concern   Not on file  Social History Narrative   Married for 12 years.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   Social Determinants of Health   Financial Resource Strain: Not on file  Food Insecurity: Not on file  Transportation Needs: Not on file  Physical Activity: Not on file  Stress: Not on file  Social Connections: Not on file     Family History: The patient's family history includes Alcohol abuse in her father; Diabetes in her mother; Heart disease in her father and mother; Stroke in her mother.  ROS:   Please see the history of present illness.    Review of Systems  Constitutional:  Positive for malaise/fatigue. Negative for chills and fever.  HENT:  Negative for sore throat.   Eyes:  Negative for blurred vision.  Respiratory:  Positive for shortness of  breath.   Cardiovascular:  Positive for chest pain. Negative for palpitations, orthopnea, claudication, leg swelling and PND.  Gastrointestinal:  Negative for melena, nausea and vomiting.  Genitourinary:  Negative for flank pain.  Musculoskeletal:  Negative for falls.  Neurological:  Negative for dizziness and loss of consciousness.  Psychiatric/Behavioral:  Negative for substance abuse.    EKGs/Labs/Other Studies Reviewed:    The following studies were reviewed today: TTE Jan 23, 2020:  1. Left ventricular ejection fraction, by visual estimation, is 55 to  60%. The left ventricle has normal function. There is no left ventricular  hypertrophy.   2. The left ventricle has no regional wall motion abnormalities.   3. Global right ventricle has normal systolic function.The right  ventricular size is normal. No  increase in right ventricular wall  thickness.   4. Left atrial size was normal.   5. Right atrial size was normal.   6. The mitral valve is normal in structure. Mild mitral valve  regurgitation. No evidence of mitral stenosis.   7. The tricuspid valve is normal in structure.   8. The tricuspid valve is normal in structure. Tricuspid valve  regurgitation is mild.   9. The aortic valve is normal in structure. Aortic valve regurgitation is  not visualized. No evidence of aortic valve sclerosis or stenosis.  10. The pulmonic valve was normal in structure. Pulmonic valve  regurgitation is not visualized.  11. Normal pulmonary artery systolic pressure.  12. The tricuspid regurgitant velocity is 1.66 m/s, and with an assumed  right atrial pressure of 3 mmHg, the estimated right ventricular systolic  pressure is normal at 14.0 mmHg.  13. The inferior vena cava is normal in size with greater than 50%  respiratory variability, suggesting right atrial pressure of 3 mmHg.  14. The average left ventricular global longitudinal strain is 21.2 %.  Carotid ultrasound 10/2017: Final  Interpretation:  Right Carotid: There was no evidence of thrombus, dissection,  atherosclerotic                 plaque or stenosis in the cervical carotid system.                 Imaging over the aortic arch shows what appears to be color                 turbulence near the pulmonary artery. Short axis and  subcostal                 views of the heart revealed peak CW gradient of the  pulmonic                 artery in the range of 40-54 mmHg. Pulmonic insufficiency  also                 noted.   Left Carotid: There was no evidence of thrombus, dissection,  atherosclerotic                plaque or stenosis in the cervical carotid system.   Vertebrals:  Both vertebral arteries were patent with antegrade flow.  Subclavians: Normal flow hemodynamics were seen in bilateral subclavian               arteries.               There is also a mobile linear structure in the right  subclavian               vein Discussed               with lead tech who thought structure was a valve but appears  longer               than usual  EKG:  No new ECG  Recent Labs: 05/09/2021: ALT 10; BUN 15; Creat 0.81; Hemoglobin 12.9; Platelets 273; Potassium 4.8; Sodium 132; TSH 2.09  Recent Lipid Panel    Component Value Date/Time   CHOL 261 (H) 05/09/2021 1050   TRIG 137 05/09/2021 1050   HDL 69 05/09/2021 1050   CHOLHDL 3.8 05/09/2021 1050   LDLCALC 165 (H) 05/09/2021 1050      Physical Exam:    VS:  BP 120/80 (BP Location: Left Arm, Patient Position: Sitting,  Cuff Size: Normal)   Pulse 68   Ht '5\' 3"'$  (1.6 m)   Wt 145 lb (65.8 kg)   SpO2 98%   BMI 25.69 kg/m     Wt Readings from Last 3 Encounters:  07/08/21 145 lb (65.8 kg)  05/29/21 147 lb (66.7 kg)  05/09/21 147 lb 9.6 oz (67 kg)     GEN:  Comfortable, NAD HEENT: Normal NECK: No JVD; No carotid bruits CARDIAC: RRR, no murmurs, rubs, gallops RESPIRATORY:  Clear to auscultation without rales, wheezing or rhonchi  ABDOMEN: Soft,  non-tender, non-distended MUSCULOSKELETAL:  No edema; No deformity  SKIN: Warm and dry NEUROLOGIC:  Alert and oriented x 3 PSYCHIATRIC:  Normal affect   ASSESSMENT:    No diagnosis found.  PLAN:    In order of problems listed above:  #Microvascular Dysfunction with Angina: Patient was diagnosed with microvascular dysfunction at [facility] clinic. We have tried multiple different medication regimens to try to manage symptoms without causing too much hypotension. Overall, she has had significant improvement but continues to have breakthrough episodes. We just recently increased the nitro patch and will see how she responds. Fortunately, her blood pressure is able to tolerate the higher doses of nitro after increasing fluid and electrolyte intake. -Continue imdur '120mg'$  BID -Continue ranexa '100mg'$  BID -Increase nitro patch to 0.'4mg'$  daily -Continue nitro prn for breakthrough -If suspect tolerance to nitro; can alternate between nitro patch and amlodipine -If blood pressure allows, can try to start amlodipine in the future in addition to other meds if needed -Encouraged her to stay very hydrated and drink electrolyte replacement drinks (such as gatorade/poweraide zero) as well as to liberalize her salt intake to help keep her blood pressure up -Will see back in 3 months  #Pulmonary artery stenosis s/p stenting 09/09/2018: -06/2018 Echo: EF >55%, LAE 3.6 cm, mild PR, dilated PA and IVC, increased velocities through pulmonary artery. Peak MPA velocity 2.44M/S, peak gradient 24 mmHg  -06/2018 Chest MRI: Stenosis of the right main and left main pulmonary arteries measuring 9 and 8 mm, respectively, most likely related to reported prior episode of Takayasu's arteritis. Detection of active vasculitic inflammation is limited by MR angiogram due to significant motion artifact. Scattered left lower lobe nodules may represent atelectasis versus aspiration. August 20, 2018 RHC: RA mean 7 mmHg, RV 33/6 mmHg, PA  32/18 mean 17, PWP - 8 mmHg, Index 4.6 PTA of her left pulmonary artery 08/2018 Echo: EF >55%, dilated PA -Followed by [facility]; has been stable  -Continue ASA '81mg'$  daily -Continue imdur '120mg'$  BID -Cath [facility] in 2020 with no significant CAD  #Takayatsu Arteritis: Followed by Rheum. On MTX. -Follow-up with rheum as scheduled -Continue MTX   Medication Adjustments/Labs and Tests Ordered: Current medicines are reviewed at length with the patient today.  Concerns regarding medicines are outlined above.  No orders of the defined types were placed in this encounter.  No orders of the defined types were placed in this encounter.   Patient Instructions  Medication Instructions:   Your physician recommends that you continue on your current medications as directed. Please refer to the Current Medication list given to you today.  *If you need a refill on your cardiac medications before your next appointment, please call your pharmacy*   Follow-Up:  3-4 MONTHS IN THE OFFICE WITH AN EXTENDER     Signed, Freada Bergeron, MD  07/08/2021 1:25 PM    [facility]

## 2021-07-17 ENCOUNTER — Ambulatory Visit: Payer: BC Managed Care – PPO | Admitting: Internal Medicine

## 2021-07-19 ENCOUNTER — Telehealth (INDEPENDENT_AMBULATORY_CARE_PROVIDER_SITE_OTHER): Payer: Self-pay

## 2021-07-22 NOTE — Telephone Encounter (Signed)
Does she need a referral? If so, let me know which specialty she is requesting and for what reason.

## 2021-07-24 ENCOUNTER — Other Ambulatory Visit: Payer: Self-pay

## 2021-07-24 ENCOUNTER — Ambulatory Visit
Admission: RE | Admit: 2021-07-24 | Discharge: 2021-07-24 | Disposition: A | Discharge status: Home / Self Care | Payer: BC Managed Care – PPO | Source: Ambulatory Visit | Attending: Internal Medicine | Admitting: Internal Medicine

## 2021-07-24 ENCOUNTER — Ambulatory Visit: Payer: BC Managed Care – PPO | Admitting: Cardiology

## 2021-07-24 DIAGNOSIS — Z1231 Encounter for screening mammogram for malignant neoplasm of breast: Secondary | ICD-10-CM

## 2021-07-25 ENCOUNTER — Other Ambulatory Visit: Payer: Self-pay | Admitting: Pediatrics

## 2021-07-25 DIAGNOSIS — N63 Unspecified lump in unspecified breast: Secondary | ICD-10-CM

## 2021-07-26 ENCOUNTER — Other Ambulatory Visit: Payer: Self-pay | Admitting: Rheumatology

## 2021-07-26 DIAGNOSIS — N63 Unspecified lump in unspecified breast: Secondary | ICD-10-CM

## 2021-07-29 ENCOUNTER — Telehealth: Payer: Self-pay | Admitting: Genetic Counselor

## 2021-07-29 ENCOUNTER — Encounter: Payer: Self-pay | Admitting: Gastroenterology

## 2021-07-29 ENCOUNTER — Other Ambulatory Visit (INDEPENDENT_AMBULATORY_CARE_PROVIDER_SITE_OTHER): Payer: Self-pay

## 2021-07-29 ENCOUNTER — Ambulatory Visit (INDEPENDENT_AMBULATORY_CARE_PROVIDER_SITE_OTHER): Payer: BC Managed Care – PPO | Admitting: Gastroenterology

## 2021-07-29 VITALS — BP 124/88 | HR 73 | Ht 64.0 in | Wt 151.2 lb

## 2021-07-29 DIAGNOSIS — Z800 Family history of malignant neoplasm of digestive organs: Secondary | ICD-10-CM | POA: Diagnosis not present

## 2021-07-29 DIAGNOSIS — R194 Change in bowel habit: Secondary | ICD-10-CM | POA: Diagnosis not present

## 2021-07-29 DIAGNOSIS — Z1211 Encounter for screening for malignant neoplasm of colon: Secondary | ICD-10-CM | POA: Diagnosis not present

## 2021-07-29 NOTE — Telephone Encounter (Signed)
Received a genetic counseling referral from Dr. Tarri Glenn for a fhx of pancreatic cancer. [name] has been cld and scheduled to see Santiago Glad on 8/31 at 10am. Pt aware to arrive 15 minutes early.

## 2021-07-29 NOTE — Patient Instructions (Signed)
It was my pleasure to provide care to you today. Based on our discussion, I am providing you with my recommendations below:  RECOMMENDATION(S):   REFERRAL:  A referral, your demographics, a copy of your insurance card and your records will be sent to [facility]. You will receive a call from their office regarding the date, time and location of your appointment.   COLONOSCOPY:   You have been scheduled for a colonoscopy. Please follow written instructions given to you at your visit today.   PREP:   Please pick up your prep supplies at the pharmacy within the next 1-3 days.  INHALERS:   If you use inhalers (even only as needed), please bring them with you on the day of your procedure.  COLONOSCOPY TIPS:  To reduce nausea and dehydration, stay well hydrated for 3-4 days prior to the exam.  To prevent skin/hemorrhoid irritation - prior to wiping, put A&Dointment or vaseline on the toilet paper. Keep a towel or pad on the bed.  BEFORE STARTING YOUR PREP, drink  64oz of clear liquids in the morning. This will help to flush the colon and will ensure you are well hydrated!!!!  NOTE - This is in addition to the fluids required for to complete your prep. Use of a flavored hard candy, such as grape Anise Salvo, can counteract some of the flavor of the prep and may prevent some nausea.   FOLLOW UP:  After your procedure, you will receive a call from my office staff regarding my recommendation for follow up.  BMI:  If you are age 39 or younger, your body mass index should be between 19-25. Your Body mass index is 25.95 kg/m. If this is out of the aformentioned range listed, please consider follow up with your Primary Care Provider.   MY CHART:  The [place] [facility] providers would like to encourage you to use [facility] to communicate with providers for non-urgent requests or questions.  Due to long hold times on the telephone, sending your provider a message by [facility] may  be a faster and more efficient way to get a response.  Please allow 48 business hours for a response.  Please remember that this is for non-urgent requests.   Thank you for trusting me with your gastrointestinal care!    Thornton Park, MD, MPH

## 2021-07-29 NOTE — Progress Notes (Signed)
Referring Provider: Doree Albee, MD Primary Care Physician:  Gordan Payment, MD  Reason for Consultation:  Colon cancer screening   IMPRESSION:  Change in bowel habits with alternating diarrhea and constipation Rectal fullness that she suspects might be pelvic floor dysfunction Strong family history of cancer including a sister with FAP Alternating diarrhea and constipation     PLAN: Add daily Benefiber or Metamucil Referral for genetic counseling given strong family history Colonoscopy  Please see the "Patient Instructions" section for addition details about the plan.  HPI: Kara Anderson is a 45 y.o. female referred by Dr. Anastasio Champion for further evaluation of colon cancer screening.  The history is obtained through the patient and review of her electronic health record.  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  She presents today requesting colonoscopy.  Sister has been diagnosed with FAP. Mother just diagnosed with glioblastoma. Mom ad her aunts have not had a colonoscopy. Maternal grandmother died after a Whipple's procedure for pancreatic cancer that sounds like it involved her stomach and small bowel.  She is concerned that she may even be at higher risk due to chronic immunosuppression and does not want to wait until age 62.   She reports a longstanding history of alternating diarrhea and constipation.  She has abdominal pain that may be related.  Notes the abdominal pain is worse in the summer.  Has recently experienced a fullness at her rectum and wonders if this is related to pelvic floor dysfunction. She has wondered if her GI symptoms may be related to her chronic medications.  Endoscopic history: - No prior colonoscopy or colon cancer screening - EGD for nausea performed by Dr.  Arline Asp at the [facility] showed antral erythema. Gastric biopsies 07/29/2019 showed mild reactive gastropathy.  No H. pylori.  No intestinal metaplasia.  Prior abdominal imaging: Gastric emptying scan with elevated gastric emptying at 2 hours with the stomach entirely empty at 4 hours, small bowel transit developed with need    Past Medical History:  Diagnosis Date   Decreased libido 09/29/2019   Endometriosis 2007,2009   HLD (hyperlipidemia) 09/29/2019   Malaise and fatigue 09/29/2019   Mediastinal adenopathy    Mononucleosis    Seasonal allergies    Strep throat    Takayasu's arteritis ([place])    Thyroid disease    Viral meningitis    Vitamin D deficiency disease 09/29/2019    Past Surgical History:  Procedure Laterality Date   HIP SURGERY  2013   IR THORACENTESIS ASP PLEURAL SPACE W/IMG GUIDE  11/20/2017   LAPAROSCOPIC ABDOMINAL EXPLORATION  2007,2009   endometriosis   MEDIASTINOTOMY CHAMBERLAIN MCNEIL    Left 11/16/2017   Procedure: LEFT ANTERIOR MEDIASTINOTOMY CHAMBERLAIN PROCEDURE;  Surgeon: [name], MD;  Location: [place] [facility];  Service: Thoracic;  Laterality: Left;   NOSE SURGERY  2007    Current Outpatient Medications  Medication Sig Dispense Refill   ACTEMRA ACTPEN 162 MG/0.9ML SOAJ Inject 162 mg into the skin every 14 (fourteen) days.     celecoxib (CELEBREX) 200 MG capsule Take 200 mg by mouth 2 (two) times daily.     Cholecalciferol (VITAMIN D-3) 125 MCG (5000 UT) TABS Take 10,000 Units by mouth daily.     co-enzyme Q-10 30 MG capsule Take 30 mg by mouth daily.  EC-RX Testosterone 0.2 % CREA Place 15 mg onto the skin daily for 90 days, THEN 5 mg daily. 6% CREAM COMPOUNDED. 30 g 2   folic acid (FOLVITE) 0.5 MG tablet Take 1 mg by mouth daily.     gabapentin (NEURONTIN) 300 MG capsule Take 300 mg by mouth 2 (two) times daily. May be able to go up to TID if needed     isosorbide mononitrate (IMDUR) 120 MG 24 hr tablet Take 120 mg by mouth 2 (two)  times daily.     LORazepam (ATIVAN) 0.5 MG tablet Take 1 tablet (0.5 mg total) by mouth 2 (two) times daily as needed for anxiety. 60 tablet 1   Methotrexate 25 MG/ML SOSY Inject 25 mg into the skin once a week. Mondays.     Multiple Vitamins-Minerals (MULTIVITAMIN ADULT) CHEW Chew 2 tablets by mouth daily.     nitroGLYCERIN (NITRO-DUR) 0.4 mg/hr patch Place 1 patch (0.4 mg total) onto the skin daily. 30 patch 12   nitroGLYCERIN (NITROSTAT) 0.4 MG SL tablet Place 1 tablet (0.4 mg total) under the tongue every 5 (five) minutes as needed for chest pain. 25 tablet 3   predniSONE (DELTASONE) 5 MG tablet Take 5 mg by mouth daily.     ranolazine (RANEXA) 500 MG 12 hr tablet Take 2 tablets (1,000 mg total) by mouth 2 (two) times daily. 180 tablet 3   Red Yeast Rice Extract (RED YEAST RICE PO) Take 600 mg by mouth 2 (two) times daily.     Turmeric 500 MG CAPS Take 1,000 mg by mouth daily.     vitamin B-12 (CYANOCOBALAMIN) 1000 MCG tablet Take 3,000 mcg by mouth daily.     No current facility-administered medications for this visit.    Allergies as of 07/29/2021 - Review Complete 07/29/2021  Allergen Reaction Noted   Codeine Nausea Only 10/29/2015    Family History  Problem Relation Age of Onset   Heart disease Mother    Stroke Mother    Diabetes Mother    Heart disease Father    Alcohol abuse Father    Other Sister        FAP   Pancreatic disease Maternal Grandmother    Stomach cancer Maternal Grandmother    Colon cancer Maternal Grandmother    Esophageal cancer Neg Hx    Liver disease Neg Hx     Social History   Socioeconomic History   Marital status: Married    Spouse name: Not on file   Number of children: Not on file   Years of education: Not on file   Highest education level: Not on file  Occupational History   Not on file  Tobacco Use   Smoking status: Never    Passive exposure: Never   Smokeless tobacco: Never  Vaping Use   Vaping Use: Never used  Substance and  Sexual Activity   Alcohol use: No    Alcohol/week: 0.0 standard drinks   Drug use: No   Sexual activity: Yes    Birth control/protection: None  Other Topics Concern   Not on file  Social History Narrative   Married for 12 years.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   Social Determinants of Health   Financial Resource Strain: Not on file  Food Insecurity: Not on file  Transportation Needs: Not on file  Physical Activity: Not on file  Stress: Not on file  Social Connections: Not on file  Intimate Partner Violence: Not on file  Review of Systems: 12 system ROS is negative except as noted above.   Physical Exam: General:   Alert,  well-nourished, pleasant and cooperative in NAD Head:  Normocephalic and atraumatic. Eyes:  Sclera clear, no icterus.   Conjunctiva pink. Ears:  Normal auditory acuity. Nose:  No deformity, discharge,  or lesions. Mouth:  No deformity or lesions.   Neck:  Supple; no masses or thyromegaly. Lungs:  Clear throughout to auscultation.   No wheezes. Heart:  Regular rate and rhythm; no murmurs. Abdomen:  Soft,nontender, nondistended, normal bowel sounds, no rebound or guarding. No hepatosplenomegaly.   Rectal:  Deferred  Msk:  Symmetrical. No boney deformities LAD: No inguinal or umbilical LAD Extremities:  No clubbing or edema. Neurologic:  Alert and  oriented x4;  grossly nonfocal Skin:  Intact without significant lesions or rashes. Psych:  Alert and cooperative. Normal mood and affect.     Makenzie Weisner L. Tarri Glenn, MD, MPH 07/29/2021, 10:25 AM

## 2021-07-30 ENCOUNTER — Telehealth: Payer: Self-pay

## 2021-07-30 NOTE — Telephone Encounter (Signed)
I called pt to let her know Dr Posey Pronto will not take pt as PCP that is currently on Hormone Therapy.

## 2021-07-31 ENCOUNTER — Ambulatory Visit (INDEPENDENT_AMBULATORY_CARE_PROVIDER_SITE_OTHER): Payer: BC Managed Care – PPO | Admitting: Nurse Practitioner

## 2021-07-31 ENCOUNTER — Other Ambulatory Visit: Payer: Self-pay

## 2021-07-31 ENCOUNTER — Encounter (INDEPENDENT_AMBULATORY_CARE_PROVIDER_SITE_OTHER): Payer: Self-pay | Admitting: Nurse Practitioner

## 2021-07-31 VITALS — BP 118/80 | HR 70 | Temp 97.2°F | Ht 63.5 in | Wt 151.2 lb

## 2021-07-31 DIAGNOSIS — F419 Anxiety disorder, unspecified: Secondary | ICD-10-CM | POA: Diagnosis not present

## 2021-07-31 DIAGNOSIS — R059 Cough, unspecified: Secondary | ICD-10-CM

## 2021-07-31 MED ORDER — AZITHROMYCIN 250 MG PO TABS: ORAL_TABLET | ORAL | 0 refills | Status: AC

## 2021-07-31 MED ORDER — LORAZEPAM 0.5 MG PO TABS: 0.5000 mg | ORAL_TABLET | Freq: Two times a day (BID) | ORAL | 0 refills | Status: DC | PRN

## 2021-07-31 NOTE — Progress Notes (Signed)
Subjective:  Patient ID: Kara Anderson, female    DOB: [birth year]  Age: 45 y.o. MRN: [id number]  CC:  Chief Complaint  Patient presents with   Follow-up    Discuss changes in medications, having an autoimmune flare up, green nasal mucous for 3 weeks, ears are aching, coughing up yellow mucous for 4 days, SOB and possible wheezing, headache, started new medication Actemra on Friday and not sure if from this new medication, needs med refills for her and husband for BP, lorazepam, and singulair      HPI  This patient arrives today for an acute visit for the above.  She tells me that she has been having more nasal discharge for last 3 to 4 weeks.  She is also been experiencing some more shortness of breath.  She has a chronic cough but tells me that its now become productive and she is coughing up white or yellow thick sputum.  Her shortness of breath is also chronic but she tells me that actually gotten worse over the last 3 to 4 weeks.  She tells me about 3 weeks ago she traveled to [place] to visit her mom who was recently diagnosed with brain cancer.  She is immunosuppressed due to her autoimmune disease as well as some medication she is on.  She has taken a Z-Pak in the past which is helped her symptoms when she has had similar episodes.  She also is experiencing bilateral ear pain and stuffiness.  She denies any leg swelling or orthopnea.  She is also requesting refill on her lorazepam. Past Medical History:  Diagnosis Date   Decreased libido 09/29/2019   Endometriosis 2007,2009   HLD (hyperlipidemia) 09/29/2019   Malaise and fatigue 09/29/2019   Mediastinal adenopathy    Mononucleosis    Seasonal allergies    Strep throat    Takayasu's arteritis (HCC)    Thyroid disease    Viral meningitis    Vitamin D deficiency disease 09/29/2019      Family History  Problem Relation Age of Onset   Heart disease Mother    Stroke Mother    Diabetes Mother    Heart disease Father     Alcohol abuse Father    Other Sister        FAP   Pancreatic disease Maternal Grandmother    Stomach cancer Maternal Grandmother    Colon cancer Maternal Grandmother    Esophageal cancer Neg Hx    Liver disease Neg Hx     Social History   Social History Narrative   Married for 12 years.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   Social History   Tobacco Use   Smoking status: Never    Passive exposure: Never   Smokeless tobacco: Never  Substance Use Topics   Alcohol use: No    Alcohol/week: 0.0 standard drinks     Current Meds  Medication Sig   ACTEMRA ACTPEN 162 MG/0.9ML SOAJ Inject 162 mg into the skin every 14 (fourteen) days.   azithromycin (ZITHROMAX) 250 MG tablet Take 2 tablets on day 1, then 1 tablet daily on days 2 through 5   celecoxib (CELEBREX) 200 MG capsule Take 200 mg by mouth 2 (two) times daily.   Cholecalciferol (VITAMIN D-3) 125 MCG (5000 UT) TABS Take 10,000 Units by mouth daily.   co-enzyme Q-10 30 MG capsule Take 30 mg by mouth daily.   EC-RX Testosterone 0.2 % CREA Place 15 mg onto  the skin daily for 90 days, THEN 5 mg daily. 6% CREAM COMPOUNDED.   folic acid (FOLVITE) 0.5 MG tablet Take 1 mg by mouth daily.   gabapentin (NEURONTIN) 300 MG capsule Take 300 mg by mouth 2 (two) times daily. May be able to go up to TID if needed   isosorbide mononitrate (IMDUR) 120 MG 24 hr tablet Take 120 mg by mouth 2 (two) times daily.   Methotrexate 25 MG/ML SOSY Inject 25 mg into the skin once a week. Mondays.   Multiple Vitamins-Minerals (MULTIVITAMIN ADULT) CHEW Chew 2 tablets by mouth daily.   nitroGLYCERIN (NITRO-DUR) 0.4 mg/hr patch Place 1 patch (0.4 mg total) onto the skin daily.   nitroGLYCERIN (NITROSTAT) 0.4 MG SL tablet Place 1 tablet (0.4 mg total) under the tongue every 5 (five) minutes as needed for chest pain.   predniSONE (DELTASONE) 5 MG tablet Take 5 mg by mouth daily.   ranolazine (RANEXA) 500 MG 12 hr tablet Take 2 tablets  (1,000 mg total) by mouth 2 (two) times daily.   Red Yeast Rice Extract (RED YEAST RICE PO) Take 600 mg by mouth 2 (two) times daily.   Turmeric 500 MG CAPS Take 1,000 mg by mouth daily.   vitamin B-12 (CYANOCOBALAMIN) 1000 MCG tablet Take 3,000 mcg by mouth daily.   [DISCONTINUED] LORazepam (ATIVAN) 0.5 MG tablet Take 1 tablet (0.5 mg total) by mouth 2 (two) times daily as needed for anxiety.    ROS:  Review of Systems  HENT:  Positive for ear pain and sinus pain.   Respiratory:  Positive for sputum production and shortness of breath.   Cardiovascular:  Negative for chest pain, orthopnea and leg swelling.  Neurological:  Negative for dizziness and headaches.    Objective:   Today's Vitals: BP 118/80   Pulse 70   Temp (!) 97.2 F (36.2 C) (Temporal)   Ht 5' 3.5" (1.613 m)   Wt 151 lb 3.2 oz (68.6 kg)   SpO2 99%   BMI 26.36 kg/m  Vitals with BMI 07/31/2021 07/29/2021 07/08/2021  Height 5' 3.5" '5\' 4"'$  '5\' 3"'$   Weight 151 lbs 3 oz 151 lbs 3 oz 145 lbs  BMI 26.36 [id number]  Systolic [id number]  Diastolic 80 88 80  Pulse 70 73 68     Physical Exam Vitals reviewed.  Constitutional:      General: She is not in acute distress.    Appearance: Normal appearance.  HENT:     Head: Normocephalic and atraumatic.     Right Ear: Hearing, tympanic membrane, ear canal and external ear normal. No swelling. Tympanic membrane is not erythematous.     Left Ear: Hearing, tympanic membrane, ear canal and external ear normal. No swelling. Tympanic membrane is not erythematous.  Neck:     Vascular: No carotid bruit.  Cardiovascular:     Rate and Rhythm: Normal rate and regular rhythm.     Pulses: Normal pulses.     Heart sounds: Murmur heard.  Pulmonary:     Effort: Pulmonary effort is normal.     Breath sounds: Normal breath sounds.  Lymphadenopathy:     Cervical: No cervical adenopathy.  Skin:    General: Skin is warm and dry.  Neurological:     General: No focal deficit present.      Mental Status: She is alert and oriented to person, place, and time.  Psychiatric:        Mood and Affect: Mood normal.  Behavior: Behavior normal.        Judgment: Judgment normal.         Assessment and Plan   1. Cough   2. Anxiety      Plan: 1.  Vital signs stable however because she is immunocompromise I am concerned that she may be developing pneumonia.  Clinically on exam she looked well, but as her symptoms of shortness of breath and cough seem to be worsening we will treat her empirically with antibiotics.  I want to treat her with Augmentin however this does have interaction with her methotrexate so we will treat her with a Z-Pak since she has tolerated this previously in the past.  Consider getting chest x-ray however I do not think that the treatment plan would change as it does not sound that she has heart failure acutely, however she was told that if her symptoms do not improve by early next week she is to call this office.  At which point we will probably proceed with chest x-ray and may be some additional blood work.  She tells me she understands. 2.  Lorazepam refill sent to pharmacy today.   Tests ordered No orders of the defined types were placed in this encounter.     Meds ordered this encounter  Medications   azithromycin (ZITHROMAX) 250 MG tablet    Sig: Take 2 tablets on day 1, then 1 tablet daily on days 2 through 5    Dispense:  6 tablet    Refill:  0    Order Specific Question:   Supervising Provider    Answer:   Lindell Spar [id number]   LORazepam (ATIVAN) 0.5 MG tablet    Sig: Take 1 tablet (0.5 mg total) by mouth 2 (two) times daily as needed for anxiety.    Dispense:  60 tablet    Refill:  0    Order Specific Question:   Supervising Provider    Answer:   Lindell Spar [id number]    Patient not scheduled for follow-up as this office is closing permanently as of 08/14/21 due to the passing of Dr. Anastasio Champion.  The patient was notified of  this and that they will need to find a new primary care provider.  They express understanding.   Ailene Ards, NP

## 2021-08-02 ENCOUNTER — Emergency Department (HOSPITAL_COMMUNITY): Payer: BC Managed Care – PPO

## 2021-08-02 ENCOUNTER — Other Ambulatory Visit: Payer: Self-pay

## 2021-08-02 ENCOUNTER — Emergency Department (HOSPITAL_COMMUNITY)
Admission: EM | Admit: 2021-08-02 | Discharge: 2021-08-02 | Disposition: A | Discharge status: Home / Self Care | Payer: BC Managed Care – PPO | Attending: Emergency Medicine | Admitting: Emergency Medicine

## 2021-08-02 ENCOUNTER — Encounter (HOSPITAL_COMMUNITY): Payer: Self-pay | Admitting: *Deleted

## 2021-08-02 DIAGNOSIS — R519 Headache, unspecified: Secondary | ICD-10-CM | POA: Insufficient documentation

## 2021-08-02 DIAGNOSIS — R093 Abnormal sputum: Secondary | ICD-10-CM | POA: Insufficient documentation

## 2021-08-02 DIAGNOSIS — R059 Cough, unspecified: Secondary | ICD-10-CM | POA: Diagnosis not present

## 2021-08-02 DIAGNOSIS — Z20822 Contact with and (suspected) exposure to covid-19: Secondary | ICD-10-CM | POA: Insufficient documentation

## 2021-08-02 HISTORY — DX: Unspecified osteoarthritis, unspecified site: M19.90

## 2021-08-02 LAB — COMPREHENSIVE METABOLIC PANEL
ALT: 17 U/L (ref 0–44)
AST: 18 U/L (ref 15–41)
Albumin: 4.1 g/dL (ref 3.5–5.0)
Alkaline Phosphatase: 32 U/L — ABNORMAL LOW (ref 38–126)
Anion gap: 4 — ABNORMAL LOW (ref 5–15)
BUN: 16 mg/dL (ref 6–20)
CO2: 27 mmol/L (ref 22–32)
Calcium: 8.2 mg/dL — ABNORMAL LOW (ref 8.9–10.3)
Chloride: 99 mmol/L (ref 98–111)
Creatinine, Ser: 0.82 mg/dL (ref 0.44–1.00)
GFR, Estimated: >60 mL/min (ref >60)
Glucose, Bld: 94 mg/dL (ref 70–99)
Potassium: 3.8 mmol/L (ref 3.5–5.1)
Sodium: 130 mmol/L — ABNORMAL LOW (ref 135–145)
Total Bilirubin: 0.7 mg/dL (ref 0.3–1.2)
Total Protein: 6.6 g/dL (ref 6.5–8.1)

## 2021-08-02 LAB — CBC WITH DIFFERENTIAL/PLATELET
Abs Immature Granulocytes: 0.01 10*3/uL (ref 0.00–0.07)
Basophils Absolute: 0 10*3/uL (ref 0.0–0.1)
Basophils Relative: 1 %
Eosinophils Absolute: 0.1 10*3/uL (ref 0.0–0.5)
Eosinophils Relative: 2 %
HCT: 37.6 % (ref 36.0–46.0)
Hemoglobin: 12.9 g/dL (ref 12.0–15.0)
Immature Granulocytes: 0 %
Lymphocytes Relative: 24 %
Lymphs Abs: 1.2 10*3/uL (ref 0.7–4.0)
MCH: 34.9 pg — ABNORMAL HIGH (ref 26.0–34.0)
MCHC: 34.3 g/dL (ref 30.0–36.0)
MCV: 101.6 fL — ABNORMAL HIGH (ref 80.0–100.0)
Monocytes Absolute: 0.5 10*3/uL (ref 0.1–1.0)
Monocytes Relative: 9 %
Neutro Abs: 3.1 10*3/uL (ref 1.7–7.7)
Neutrophils Relative %: 64 %
Platelets: 279 10*3/uL (ref 150–400)
RBC: 3.7 MIL/uL — ABNORMAL LOW (ref 3.87–5.11)
RDW: 12.1 % (ref 11.5–15.5)
WBC: 4.9 10*3/uL (ref 4.0–10.5)
nRBC: 0 % (ref 0.0–0.2)

## 2021-08-02 LAB — RESP PANEL BY RT-PCR (FLU A&B, COVID) ARPGX2
Influenza A by PCR: NEGATIVE
Influenza B by PCR: NEGATIVE
SARS Coronavirus 2 by RT PCR: NEGATIVE

## 2021-08-02 NOTE — ED Provider Notes (Signed)
[facility] Provider Note   CSN: [id number] Arrival date & time: 08/02/21  1704     History No chief complaint on file.   VERYL MCMUNN is a 45 y.o. female.  Pt reports she has had a cough for 4 weeks. Pt reports this week she is coughing up green sputum.  Pt saw NP and was started on Zithromax yesterday.  Pt is concerned that she has pneumonia.  Pt also  reports she has  a headache when she stands up. Pt is worried about menigitis.  Pt is on methotrexate and actemra for Takayasu arteritis. Pt reports she take nitroglycerin for pain.    The history is provided by the patient. No language interpreter was used.      Past Medical History:  Diagnosis Date   Arthritis    Decreased libido 09/29/2019   Endometriosis 2007,2009   HLD (hyperlipidemia) 09/29/2019   Malaise and fatigue 09/29/2019   Mediastinal adenopathy    Mononucleosis    Seasonal allergies    Strep throat    Takayasu's arteritis ([place])    Thyroid disease    Viral meningitis    Vitamin D deficiency disease 09/29/2019    Patient Active Problem List   Diagnosis Date Noted   HLD (hyperlipidemia) 09/29/2019   Vitamin D deficiency disease 09/29/2019   Malaise and fatigue 09/29/2019   Decreased libido 09/29/2019   Takayasu's arteritis ([place]) 10/14/2018   Vasculitis ([address]) 12/19/2017   Mediastinal mass 11/04/2017   Pulmonary stenosis 11/04/2017   Shortness of breath 10/31/2017   Bruit 10/31/2017   Murmur 10/31/2017   Breast mass, right 10/29/2015    Past Surgical History:  Procedure Laterality Date   HIP SURGERY  2013   IR THORACENTESIS ASP PLEURAL SPACE W/IMG GUIDE  11/20/2017   LAPAROSCOPIC ABDOMINAL EXPLORATION  2007,2009   endometriosis   MEDIASTINOTOMY CHAMBERLAIN MCNEIL    Left 11/16/2017   Procedure: LEFT ANTERIOR MEDIASTINOTOMY CHAMBERLAIN PROCEDURE;  Surgeon: [name], MD;  Location: [facility] OR;  Service: Thoracic;  Laterality: Left;   NOSE SURGERY  2007     OB History      Gravida  0   Para  0   Term  0   Preterm  0   AB  0   Living  0      SAB  0   IAB  0   Ectopic  0   Multiple  0   Live Births           Obstetric Comments  1st Menstrual Cycle:  69          Family History  Problem Relation Age of Onset   Heart disease Mother    Stroke Mother    Diabetes Mother    Heart disease Father    Alcohol abuse Father    Other Sister        FAP   Pancreatic disease Maternal Grandmother    Stomach cancer Maternal Grandmother    Colon cancer Maternal Grandmother    Esophageal cancer Neg Hx    Liver disease Neg Hx     Social History   Tobacco Use   Smoking status: Never    Passive exposure: Never   Smokeless tobacco: Never  Vaping Use   Vaping Use: Never used  Substance Use Topics   Alcohol use: No    Alcohol/week: 0.0 standard drinks   Drug use: No    Home Medications Prior to Admission medications   Medication  Sig Start Date End Date Taking? Authorizing Provider  ACTEMRA ACTPEN 162 MG/0.9ML SOAJ Inject 162 mg into the skin every 14 (fourteen) days. 07/22/21   [provider]  azithromycin (ZITHROMAX) 250 MG tablet Take 2 tablets on day 1, then 1 tablet daily on days 2 through 5 07/31/21 08/05/21  Ailene Ards, NP  celecoxib (CELEBREX) 200 MG capsule Take 200 mg by mouth 2 (two) times daily. 07/08/21   [provider]  Cholecalciferol (VITAMIN D-3) 125 MCG (5000 UT) TABS Take 10,000 Units by mouth daily.    [provider]  co-enzyme Q-10 30 MG capsule Take 30 mg by mouth daily.    [provider]  EC-RX Testosterone 0.2 % CREA Place 15 mg onto the skin daily for 90 days, THEN 5 mg daily. 6% CREAM COMPOUNDED. 05/23/21 11/19/21  Doree Albee, MD  folic acid (FOLVITE) 0.5 MG tablet Take 1 mg by mouth daily.    [provider]  gabapentin (NEURONTIN) 300 MG capsule Take 300 mg by mouth 2 (two) times daily. May be able to go up to TID if needed 05/09/21   [provider]   isosorbide mononitrate (IMDUR) 120 MG 24 hr tablet Take 120 mg by mouth 2 (two) times daily. 05/08/21   [provider]  LORazepam (ATIVAN) 0.5 MG tablet Take 1 tablet (0.5 mg total) by mouth 2 (two) times daily as needed for anxiety. 07/31/21   Ailene Ards, NP  Methotrexate 25 MG/ML SOSY Inject 25 mg into the skin once a week. Mondays.    [provider]  Multiple Vitamins-Minerals (MULTIVITAMIN ADULT) CHEW Chew 2 tablets by mouth daily.    [provider]  nitroGLYCERIN (NITRO-DUR) 0.4 mg/hr patch Place 1 patch (0.4 mg total) onto the skin daily. 07/05/21 08/04/21  Freada Bergeron, MD  nitroGLYCERIN (NITROSTAT) 0.4 MG SL tablet Place 1 tablet (0.4 mg total) under the tongue every 5 (five) minutes as needed for chest pain. 04/24/21   Freada Bergeron, MD  predniSONE (DELTASONE) 5 MG tablet Take 5 mg by mouth daily. 07/25/21   [provider]  ranolazine (RANEXA) 500 MG 12 hr tablet Take 2 tablets (1,000 mg total) by mouth 2 (two) times daily. 05/03/21   Freada Bergeron, MD  Red Yeast Rice Extract (RED YEAST RICE PO) Take 600 mg by mouth 2 (two) times daily.    [provider]  Turmeric 500 MG CAPS Take 1,000 mg by mouth daily.    [provider]  vitamin B-12 (CYANOCOBALAMIN) 1000 MCG tablet Take 3,000 mcg by mouth daily.    [provider]    Allergies    Codeine  Review of Systems   Review of Systems  Constitutional:  Negative for chills and fever.  HENT:  Negative for ear pain.   Respiratory:  Negative for cough and shortness of breath.   Cardiovascular:  Negative for chest pain.  Gastrointestinal:  Negative for abdominal pain and vomiting.  Musculoskeletal:  Negative for arthralgias.  Skin:  Negative for color change and rash.  All other systems reviewed and are negative.  Physical Exam Updated Vital Signs BP 134/88   Pulse 64   Temp 98 F (36.7 C)   Resp 20   Ht '5\' 4"'$  (1.626 m)   Wt 68.5 kg   LMP  07/30/2021   SpO2 100%   BMI 25.92 kg/m   Physical Exam Vitals and nursing note reviewed.  Constitutional:      Appearance:  She is well-developed.  HENT:     Head: Normocephalic.     Right Ear: Tympanic membrane normal.     Left Ear: Tympanic membrane normal.     Nose: Nose normal.  Cardiovascular:     Rate and Rhythm: Normal rate and regular rhythm.     Pulses: Normal pulses.  Pulmonary:     Effort: Pulmonary effort is normal.  Abdominal:     General: There is no distension.  Musculoskeletal:        General: Normal range of motion.     Cervical back: Normal range of motion.  Skin:    General: Skin is warm.  Neurological:     General: No focal deficit present.     Mental Status: She is alert and oriented to person, place, and time.    ED Results / Procedures / Treatments   Labs (all labs ordered are listed, but only abnormal results are displayed) Labs Reviewed  RESP PANEL BY RT-PCR (FLU A&B, COVID) ARPGX2  CBC WITH DIFFERENTIAL/PLATELET  COMPREHENSIVE METABOLIC PANEL    EKG None  Radiology No results found.  Procedures Procedures   Medications Ordered in ED Medications - No data to display  ED Course  I have reviewed the triage vital signs and the nursing notes.  Pertinent labs & imaging results that were available during my care of the patient were reviewed by me and considered in my medical decision making (see chart for details).    MDM Rules/Calculators/A&P                           MDM:  labs, covid and chest xray are ordered   Final Clinical Impression(s) / ED Diagnoses Final diagnoses:  Cough    Rx / DC Orders ED Discharge Orders     None        Sidney Ace 08/03/21 1002    Daleen Bo, MD 08/03/21 1358

## 2021-08-02 NOTE — ED Provider Notes (Signed)
Patient was received at shift change from [facility], PA-C, she provided HPI, current work-up, likely disposition please see her note for full detail.  Patient with significant medical history ofTakayasu arteritis currently on methotrextrate and Actemra (started it last Friday) presents with 4 weeks of a cough, coughing up green sputum, she was seen by her PCP, she was started on Zithromax yesterday.  She was concern for possible pneumonia, she also notes that she is having a headache, it is worsened with position, feels as if she has pressure in her head when she goes from a laying down position to a sitting position.  She denies history of IV drug use, denies fevers, chills, denies recent spinal taps, spinal injections, no history of meningitis.  Current work-up reveals CBC unremarkable, CMP shows hyponatremia 130, respiratory panel negative, chest x-ray unremarkable.  Per previous provider follow-up on respiratory panel, if negative patient can be discharged home continue with current antibiotic treatment.  Respiratory panel is negative this time, vital signs remained stable, no meningeal sign present, no cytopenia, no signs of systemic infection.  Possible she is suffering from a viral infection, will have her continue with current antibiotic treatment.  Follow-up with her rheumatologist for further evaluation.  Vital signs have remained stable, no indication for hospital admission.  Patient discussed with attending and they agreed with assessment and plan.  Patient given at home care as well strict return precautions.  Patient verbalized that they understood agreed to said plan.     Marcello Fennel, PA-C 08/02/21 2035    Daleen Bo, MD 08/03/21 [phone]

## 2021-08-02 NOTE — ED Triage Notes (Signed)
States she is immunosuppressed due to r/a , just started on a zpack yesterday. States she has had cold symptoms with productive cough for over 2 weeks

## 2021-08-02 NOTE — Discharge Instructions (Addendum)
Exam and lab work are all reassuring.  Please continue with all home medication as prescribed.  Please continue with the Z-Pak that was prescribed to you.  I recommend that you follow-up with your rheumatologist and or your PCP for further evaluation   Please come back to emergency department if you develop uncontrolled fever, not tolerating p.o., you have severe chest pain, shortness of breath, develop a systemic rash.

## 2021-08-07 ENCOUNTER — Other Ambulatory Visit (INDEPENDENT_AMBULATORY_CARE_PROVIDER_SITE_OTHER): Payer: Self-pay | Admitting: Nurse Practitioner

## 2021-08-07 ENCOUNTER — Encounter (INDEPENDENT_AMBULATORY_CARE_PROVIDER_SITE_OTHER): Payer: Self-pay | Admitting: Nurse Practitioner

## 2021-08-07 DIAGNOSIS — R059 Cough, unspecified: Secondary | ICD-10-CM

## 2021-08-07 MED ORDER — AMOXICILLIN-POT CLAVULANATE 875-125 MG PO TABS
1.0000 | ORAL_TABLET | Freq: Two times a day (BID) | ORAL | 0 refills | Status: DC
Start: 2021-08-07 — End: 2021-09-04

## 2021-08-12 ENCOUNTER — Telehealth: Payer: Self-pay | Admitting: Genetic Counselor

## 2021-08-12 NOTE — Telephone Encounter (Signed)
Scheduled appointment per 08/29 sch msg. Left message.  

## 2021-08-14 ENCOUNTER — Encounter: Payer: BC Managed Care – PPO | Admitting: Genetic Counselor

## 2021-08-14 ENCOUNTER — Other Ambulatory Visit: Payer: BC Managed Care – PPO

## 2021-08-21 DIAGNOSIS — N951 Menopausal and female climacteric states: Secondary | ICD-10-CM | POA: Diagnosis not present

## 2021-08-21 DIAGNOSIS — E782 Mixed hyperlipidemia: Secondary | ICD-10-CM | POA: Diagnosis not present

## 2021-08-21 DIAGNOSIS — R5382 Chronic fatigue, unspecified: Secondary | ICD-10-CM | POA: Diagnosis not present

## 2021-08-21 DIAGNOSIS — R635 Abnormal weight gain: Secondary | ICD-10-CM | POA: Diagnosis not present

## 2021-08-27 DIAGNOSIS — N951 Menopausal and female climacteric states: Secondary | ICD-10-CM | POA: Diagnosis not present

## 2021-08-27 DIAGNOSIS — Z6826 Body mass index (BMI) 26.0-26.9, adult: Secondary | ICD-10-CM | POA: Diagnosis not present

## 2021-08-27 DIAGNOSIS — R5382 Chronic fatigue, unspecified: Secondary | ICD-10-CM | POA: Diagnosis not present

## 2021-08-27 DIAGNOSIS — E782 Mixed hyperlipidemia: Secondary | ICD-10-CM | POA: Diagnosis not present

## 2021-08-27 DIAGNOSIS — Z1339 Encounter for screening examination for other mental health and behavioral disorders: Secondary | ICD-10-CM | POA: Diagnosis not present

## 2021-08-27 DIAGNOSIS — Z1331 Encounter for screening for depression: Secondary | ICD-10-CM | POA: Diagnosis not present

## 2021-08-28 ENCOUNTER — Other Ambulatory Visit: Payer: Self-pay | Admitting: Genetic Counselor

## 2021-08-28 ENCOUNTER — Inpatient Hospital Stay: Payer: BC Managed Care – PPO

## 2021-08-28 ENCOUNTER — Inpatient Hospital Stay: Payer: BC Managed Care – PPO | Attending: Genetic Counselor | Admitting: Genetic Counselor

## 2021-08-28 ENCOUNTER — Encounter: Payer: Self-pay | Admitting: Genetic Counselor

## 2021-08-28 ENCOUNTER — Other Ambulatory Visit: Payer: Self-pay

## 2021-08-28 DIAGNOSIS — Z800 Family history of malignant neoplasm of digestive organs: Secondary | ICD-10-CM

## 2021-08-28 DIAGNOSIS — Z803 Family history of malignant neoplasm of breast: Secondary | ICD-10-CM | POA: Diagnosis not present

## 2021-08-28 DIAGNOSIS — Z808 Family history of malignant neoplasm of other organs or systems: Secondary | ICD-10-CM | POA: Diagnosis not present

## 2021-08-28 DIAGNOSIS — Z8371 Family history of colonic polyps: Secondary | ICD-10-CM

## 2021-08-28 NOTE — Progress Notes (Signed)
REFERRING PROVIDER: Thornton Park, MD [place],  [place] [address]  PRIMARY PROVIDER:  Gordan Payment, MD  PRIMARY REASON FOR VISIT:  1. Family history of brain cancer   2. Family history of breast cancer   3. Family history of colon cancer   4. Family history of pancreatic cancer      HISTORY OF PRESENT ILLNESS:   Kara Anderson, a 45 y.o. female, was seen for a [facility] cancer genetics consultation at the request of Dr. Tarri Glenn due to a family history of cancer and her sister having a colon polyp and reportedly a diagnosis of FAP.  Kara Anderson presents to clinic today to discuss the possibility of a hereditary predisposition to cancer, genetic testing, and to further clarify her future cancer risks, as well as potential cancer risks for family members.   Kara Anderson is a 45 y.o. female with no personal history of cancer.  Her sister had reportedly been diagnosed with FAP and the patient is interested in testing for this.  CANCER HISTORY:  Oncology History   No history exists.     RISK FACTORS:  Menarche was at age 59.  First live birth at age N/A.  OCP use for approximately  15+  years.  Ovaries intact: yes.  Hysterectomy: no.  Menopausal status: premenopausal.  HRT use: 0 years. Colonoscopy: scheduled; not examined. Mammogram within the last year: yes. Number of breast biopsies: 0. Up to date with pelvic exams: yes. 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[place])    Thyroid disease    Viral meningitis    Vitamin D deficiency  disease 09/29/2019    Past Surgical History:  Procedure Laterality Date   HIP SURGERY  2013   IR THORACENTESIS ASP PLEURAL SPACE W/IMG GUIDE  11/20/2017   LAPAROSCOPIC ABDOMINAL EXPLORATION  2007,2009   endometriosis   MEDIASTINOTOMY CHAMBERLAIN MCNEIL    Left 11/16/2017   Procedure: LEFT ANTERIOR MEDIASTINOTOMY CHAMBERLAIN PROCEDURE;  Surgeon: [name], MD;  Location: [facility] OR;  Service: Thoracic;  Laterality: Left;   NOSE SURGERY  2007    Social History   Socioeconomic History   Marital status: Married    Spouse name: Not on file   Number of children: Not on file   Years of education: Not on file   Highest education level: Not on file  Occupational History   Not on file  Tobacco Use   Smoking status: Never    Passive exposure: Never   Smokeless tobacco: Never  Vaping Use   Vaping Use: Never used  Substance and Sexual Activity   Alcohol use: No    Alcohol/week: 0.0 standard drinks   Drug use: No   Sexual activity: Yes    Birth control/protection: None  Other Topics Concern   Not on file  Social History Narrative   Married for 12 years.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   Social Determinants of Health   Financial Resource Strain: Not on  file  Food Insecurity: Not on file  Transportation Needs: Not on file  Physical Activity: Not on file  Stress: Not on file  Social Connections: Not on file     FAMILY HISTORY:  We obtained a detailed, 4-generation family history.  Significant diagnoses are listed below: Family History  Problem Relation Age of Onset   Heart disease Mother    Stroke Mother    Diabetes Mother    Brain cancer Mother 16       glioblastoma   Heart disease Father    Alcohol abuse Father    Alcoholism Father    Other Sister        FAP   Colonic polyp Sister 1       adenomatous polyp   Breast cancer Maternal Aunt    Lung cancer Paternal Uncle 95   Other Paternal Uncle 16       MVA   Pancreatic  disease Maternal Grandmother    Stomach cancer Maternal Grandmother    Colon cancer Maternal Grandmother    Heart attack Paternal Grandfather    Leukemia Cousin 12   Breast cancer Cousin 68       neg GT   Esophageal cancer Neg Hx    Liver disease Neg Hx     The patient does not have children.  She has a full sister and brother and a paternal half sister and brother who are all cancer free.  Both parents are deceased.  The patient's mother was diagnosed with a glioblastoma and died at 82.  She had two maternal half sisters and half brother and many paternal half siblings.  One maternal half sister had breast cancer.  The patient's maternal grandmother was diagnosed with colon/pancreas/stomach cancer at 67.  The patient's father died from complications of alcohol use.  He had two brothers.  One died at 51 from a car accident and his daughter died at 22 from [place].  The second brother died of lung cancer at 48 and his daughter had breast cancer at 46 and was reportedly negative on her genetic testing.  The paternal grandparents are deceased.  The grandfather reportedly had up to 5 additional children that are not reported to have cancer.  Kara Anderson is aware of previous family history of genetic testing for hereditary cancer risks. Patient's maternal ancestors are of English descent, and paternal ancestors are of Caucasian descent. There is no reported Ashkenazi Jewish ancestry. There is no known consanguinity.  GENETIC COUNSELING ASSESSMENT: Kara Anderson is a 45 y.o. female with a family history of cancer and her sister reportedly having FAP which is somewhat suggestive of a hereditary colon cancer syndrome and predisposition to cancer given the possibility of FAP. We, therefore, discussed and recommended the following at today's visit.   DISCUSSION: We discussed that 3 - 5% of colon cancer is hereditary, with most cases associated with Lynch syndrome.  There are other genes that can be associated  with hereditary colon cancer syndromes.  These include APC, which makes up approximately 1% of hereditary colon cancer syndromes.  We discussed that there are two forms of FAP, classic and attenuated.  The classic form is associated with polyposis, carpeting of the colon with polyps, and an average age of colon cancer of approximately 35.  The attenuated form is less severe with about a 70% chance of developing colon cancer if polyps are not removed.  Kara Anderson showed [place] the letter her sister sent her with the information from her  colonoscopy.  It stated that she had one adenomatous polyp removed that did not show dysplasia.  It went on to state that family members should be screened for colon cancer.  It did not mention FAP in this letter.  We discussed if there were other notes or messages that may discuss FAP.  She reports that she is not aware that her sister underwent genetic testing for FAP, and that maybe her sister had told her about it on the phone rather than emailed/texted it.  Based on her family history, and the letter provided, the history is not consistent with classic FAP or even attenuated FAP.  We asked that she try to get more information for [place] on the diagnosis, and also to please share the letter with [place] to make sure that we were not missing something by reading it on the patient's phone.  We discussed the paternal family history of breast cancer.  The patient's paternal first cousin was diagnosed with breast cancer at 77.  This cousin reportedly tested negative on her genetic testing, but it is not known how extensive the testing was (e.g. BRCA1/2 only or a full panel).  We discussed that based on this family history, we could offer genetic testing and in that testing could include FAP. Therefore we could test for all things that the patient is concerned about.  Additionally, based on the maternal family history, we would be more concerned about Lynch syndrome with the combination of cancers  including breast/glioblastoma/colon/stomach/pancreatic cancer.  However, even the likelihood for Lynch syndrome we feel would be on the lower end as there are no early onset cancers in the maternal family. We discussed that testing is beneficial for several reasons including knowing how to follow individuals after completing their treatment, identifying whether potential treatment options such as PARP inhibitors would be beneficial, and understand if other family members could be at risk for cancer and allow them to undergo genetic testing.   We reviewed the characteristics, features and inheritance patterns of hereditary cancer syndromes. We also discussed genetic testing, including the appropriate family members to test, the process of testing, insurance coverage and turn-around-time for results. We discussed the implications of a negative, positive, carrier and/or variant of uncertain significant result. We recommended Kara Anderson pursue genetic testing for the common hereditary cancer gene panel+RNA  Based on the patient's family history, a statistical model (Tyrer Cusik) was used to estimate her risk of developing breast cancer. This estimates her lifetime risk of developing breast cancer to be approximately 18.7%. This estimation does not consider any genetic testing results.  The patient's lifetime breast cancer risk is a preliminary estimate based on available information using one of several models endorsed by the [employer] (ACS). The ACS recommends consideration of breast MRI screening as an adjunct to mammography for patients at high risk (defined as 20% or greater lifetime risk). Please note that a woman's breast cancer risk changes over time. It may increase or decrease based on age and any changes to the personal and/or family medical history. The risks and recommendations listed above apply to this patient at this point in time. In the future, she may or may not be eligible for the  same medical management strategies and, in some cases, other medical management strategies may become available to her. If she is interested in an updated breast cancer risk assessment at a later date, she can contact us.    PLAN: After considering the risks, benefits, and limitations,  Kara Anderson did not wish to pursue genetic testing at today's visit. We understand this decision and remain available to coordinate genetic testing at any time in the future. We, therefore, recommend Kara Anderson continue to follow the cancer screening guidelines given by her primary healthcare provider.  Kara Anderson will be in touch with [place] about additional information on her sister.  This could include genetic testing, additional notes from her provider that state she has FAP, or if her upcoming colonoscopy concerns her and makes her want to pursue genetic testing.  Lastly, we encouraged Kara Anderson to remain in contact with cancer genetics annually so that we can continuously update the family history and inform her of any changes in cancer genetics and testing that may be of benefit for this family.   Kara Anderson questions were answered to her satisfaction today. Our contact information was provided should additional questions or concerns arise. Thank you for the referral and allowing [place] to share in the care of your patient.   Kara Anderson P. Florene Glen, [place], [facility] Licensed, Insurance risk surveyor [email or web address] phone: [phone]  The patient was seen for a total of 60 minutes in face-to-face genetic counseling.  The patient was seen alone.  This patient was discussed with Drs. Magrinat, Lindi Adie and/or Burr Medico who agrees with the above.    _______________________________________________________________________ For Office Staff:  Number of people involved in session: 1 Was an Intern/ student involved with case: yes Raymond Gurney

## 2021-08-29 DIAGNOSIS — J340 Abscess, furuncle and carbuncle of nose: Secondary | ICD-10-CM | POA: Diagnosis not present

## 2021-08-29 DIAGNOSIS — R1310 Dysphagia, unspecified: Secondary | ICD-10-CM | POA: Diagnosis not present

## 2021-09-03 DIAGNOSIS — L821 Other seborrheic keratosis: Secondary | ICD-10-CM | POA: Diagnosis not present

## 2021-09-03 DIAGNOSIS — Z86018 Personal history of other benign neoplasm: Secondary | ICD-10-CM | POA: Diagnosis not present

## 2021-09-03 DIAGNOSIS — L570 Actinic keratosis: Secondary | ICD-10-CM | POA: Diagnosis not present

## 2021-09-03 DIAGNOSIS — L578 Other skin changes due to chronic exposure to nonionizing radiation: Secondary | ICD-10-CM | POA: Diagnosis not present

## 2021-09-03 DIAGNOSIS — Z872 Personal history of diseases of the skin and subcutaneous tissue: Secondary | ICD-10-CM | POA: Diagnosis not present

## 2021-09-04 ENCOUNTER — Other Ambulatory Visit: Payer: Self-pay

## 2021-09-04 ENCOUNTER — Ambulatory Visit (INDEPENDENT_AMBULATORY_CARE_PROVIDER_SITE_OTHER): Payer: BC Managed Care – PPO | Admitting: Family Medicine

## 2021-09-04 ENCOUNTER — Encounter: Payer: Self-pay | Admitting: Family Medicine

## 2021-09-04 VITALS — BP 122/70 | HR 67 | Temp 98.2°F | Resp 16 | Ht 63.0 in | Wt 153.8 lb

## 2021-09-04 DIAGNOSIS — S00412A Abrasion of left ear, initial encounter: Secondary | ICD-10-CM

## 2021-09-04 DIAGNOSIS — I370 Nonrheumatic pulmonary valve stenosis: Secondary | ICD-10-CM

## 2021-09-04 DIAGNOSIS — I776 Arteritis, unspecified: Secondary | ICD-10-CM | POA: Diagnosis not present

## 2021-09-04 DIAGNOSIS — E782 Mixed hyperlipidemia: Secondary | ICD-10-CM

## 2021-09-04 DIAGNOSIS — R4589 Other symptoms and signs involving emotional state: Secondary | ICD-10-CM

## 2021-09-04 DIAGNOSIS — M314 Aortic arch syndrome [Takayasu]: Secondary | ICD-10-CM

## 2021-09-04 DIAGNOSIS — R5381 Other malaise: Secondary | ICD-10-CM

## 2021-09-04 DIAGNOSIS — R5383 Other fatigue: Secondary | ICD-10-CM

## 2021-09-04 DIAGNOSIS — R4584 Anhedonia: Secondary | ICD-10-CM

## 2021-09-04 MED ORDER — BUPROPION HCL ER (XL) 150 MG PO TB24: 150.0000 mg | ORAL_TABLET | Freq: Every day | ORAL | 2 refills | Status: DC

## 2021-09-04 MED ORDER — OFLOXACIN 0.3 % OT SOLN: 5.0000 [drp] | Freq: Every day | OTIC | 0 refills | Status: DC

## 2021-09-04 NOTE — Patient Instructions (Addendum)
Keep follow up with specialists as planned.  Try wellbutrin once per day for now, follow up in 1 month to discuss that med,  lorazepam, and if testosterone is needed to be restarted.   December - repeat cholesterol at that time to discuss possible statin. Return to the clinic or go to the nearest emergency room if any of your symptoms worsen or new symptoms occur.  There is a small abrasion with some bright red blood on your left your canal.  That should improve fairly quickly, but avoid any Q-tips or other objects inside the ear for the next week or 2.  Antibiotic drops 5 drops/day for the next 1 week.  If any pain or discharge from the ear other than a small amount of dried blood, return for recheck here or other care provider.

## 2021-09-04 NOTE — Progress Notes (Signed)
Subjective:  Patient ID: Kara Anderson, female    DOB: 05/10/1976  Age: 45 y.o. MRN: [id number]  CC:  Chief Complaint  Patient presents with   Establish Care    Pt here to establish care she was a Dr Lavonne Chick pt and is in need of a new pcp. Pt will in the future need refills for lorazepam but does not currently need one     HPI Kara Anderson presents for   New patient to establish care, previous primary care provider Dr. Anastasio Champion.  Takayasu Arteritis, inflammatory arthritis: Followed by rheumatologist, Dr. Barbee Shropshire at [place] (recent start), methotrexate, prednisone, celebrex, gabapentin for joint pain with goals of weaning meds over next 6 months as Actemra starts to work.  Prior use of cellcept for arteritis, but that is under control.   Microvascular angina, pulmonary artery stenosis Hx of pulmonary artery stent in 2019. On Imdur 120mg  BID. for microvascular angina. Local cardiologist Dr.Pemberton, [facility] - Dr. Posey Pronto. Nitroglycerin patch 0.4mg  QD. Uses sublingual nitroglycerin 2-5 times per day during activity or hot day. Ranexa for spasms BID. Asa 81mg  qd.   Anxiety: Takes 1/2 tablet lorazepam each morning. Helps with focus and activity. Denies depression/anxiety, but decreased motivation in the morning without lorazepam. Did not like prior SSRi - unknown name.  Mom passed in August. Some depression symptoms. Some worse mood after stopping testosterone.   Hyperlipidemia: On red yeast rice intermittently - 3 time per week.  Actemra can increase cholesterol, and concern of early heart disease?  Lab Results  Component Value Date   CHOL 261 (H) 05/09/2021   HDL 69 05/09/2021   LDLCALC 165 (H) 05/09/2021   TRIG 137 05/09/2021   CHOLHDL 3.8 05/09/2021   Lab Results  Component Value Date   ALT 17 08/02/2021   AST 18 08/02/2021   ALKPHOS 32 (L) 08/02/2021   BILITOT 0.7 08/02/2021    Followed by Lyn Henri for testosterone supplementation. Changing form cream to  pellet. Has not started. Initially started after fatigue after 6 months of prednisone, started by prior PCP Dr. Anastasio Champion. Increased mood symptoms as above. Wants to avoid med that affects sex drive. More depressed than anxious. Zoloft 30 years ago had side effects.  No hx of seizures.   Depression screen [facility] 2/9 09/04/2021 05/09/2021 12/08/2019  Decreased Interest 1 0 0  Down, Depressed, Hopeless 0 0 0  PHQ - 2 Score 1 0 0  Altered sleeping 0 0 -  Tired, decreased energy 1 0 -  Change in appetite 1 0 -  Feeling bad or failure about yourself  0 0 -  Trouble concentrating 0 0 -  Moving slowly or fidgety/restless 0 0 -  Suicidal thoughts 0 0 -  PHQ-9 Score 3 0 -  Difficult doing work/chores - Not difficult at all -   GAD 7 : Generalized Anxiety Score 09/04/2021  Nervous, Anxious, on Edge 0  Control/stop worrying 0  Worry too much - different things 1  Trouble relaxing 0  Restless 1  Easily annoyed or irritable 1  Afraid - awful might happen 0  Total GAD 7 Score 3    Left ear pain Cleaning ear with qtip today. Accidentally poked ear. Dried blood noted this afternoon. No hearing changes, no discharge.   History Patient Active Problem List   Diagnosis Date Noted   Family history of breast cancer 08/28/2021   Family history of brain cancer 08/28/2021   Family history of colon cancer 08/28/2021  Family history of pancreatic cancer 08/28/2021   HLD (hyperlipidemia) 09/29/2019   Vitamin D deficiency disease 09/29/2019   Malaise and fatigue 09/29/2019   Decreased libido 09/29/2019   Takayasu's arteritis ([place]) 10/14/2018   Vasculitis ([place]) 12/19/2017   Mediastinal mass 11/04/2017   Pulmonary stenosis 11/04/2017   Shortness of breath 10/31/2017   Bruit 10/31/2017   Murmur 10/31/2017   Breast mass, right 10/29/2015   Past Medical History:  Diagnosis Date   Arthritis    Decreased libido 09/29/2019   Endometriosis 9417,4081   Family history of brain cancer    Family history of  breast cancer    Family history of colon cancer    Family history of pancreatic cancer    Heart murmur    HLD (hyperlipidemia) 09/29/2019   Malaise and fatigue 09/29/2019   Mediastinal adenopathy    Mononucleosis    Seasonal allergies    Strep throat    Takayasu's arteritis ([place])    Thyroid disease    Viral meningitis    Vitamin D deficiency disease 09/29/2019   Past Surgical History:  Procedure Laterality Date   HIP SURGERY  2013   IR THORACENTESIS ASP PLEURAL SPACE W/IMG GUIDE  11/20/2017   LAPAROSCOPIC ABDOMINAL EXPLORATION  2007,2009   endometriosis   MEDIASTINOTOMY CHAMBERLAIN MCNEIL    Left 11/16/2017   Procedure: LEFT ANTERIOR MEDIASTINOTOMY CHAMBERLAIN PROCEDURE;  Surgeon: [name], MD;  Location: [facility];  Service: Thoracic;  Laterality: Left;   NOSE SURGERY  2007   Allergies  Allergen Reactions   Codeine Nausea Only   Prior to Admission medications   Medication Sig Start Date End Date Taking? Authorizing Provider  ACTEMRA ACTPEN 162 MG/0.9ML SOAJ Inject 162 mg into the skin every 14 (fourteen) days. 07/22/21  Yes [provider]  celecoxib (CELEBREX) 200 MG capsule Take 200 mg by mouth 2 (two) times daily. 07/08/21  Yes [provider]  Cholecalciferol (VITAMIN D-3) 125 MCG (5000 UT) TABS Take 10,000 Units by mouth daily.   Yes [provider]  co-enzyme Q-10 30 MG capsule Take 30 mg by mouth daily.   Yes [provider]  EC-RX Testosterone 0.2 % CREA Place 15 mg onto the skin daily for 90 days, THEN 5 mg daily. 6% CREAM COMPOUNDED. 05/23/21 11/19/21 Yes Gosrani, Nimish C, MD  folic acid (FOLVITE) 0.5 MG tablet Take 1 mg by mouth daily.   Yes [provider]  gabapentin (NEURONTIN) 300 MG capsule Take 300 mg by mouth 2 (two) times daily. May be able to go up to TID if needed 05/09/21  Yes [provider]  isosorbide mononitrate (IMDUR) 120 MG 24 hr tablet Take 120 mg by mouth 2 (two) times daily. 05/08/21  Yes  [provider]  LORazepam (ATIVAN) 0.5 MG tablet Take 1 tablet (0.5 mg total) by mouth 2 (two) times daily as needed for anxiety. 07/31/21  Yes Ailene Ards, NP  Methotrexate 25 MG/ML SOSY Inject 25 mg into the skin once a week. Mondays.   Yes [provider]  Multiple Vitamins-Minerals (MULTIVITAMIN ADULT) CHEW Chew 2 tablets by mouth daily.   Yes [provider]  nitroGLYCERIN (NITROSTAT) 0.4 MG SL tablet Place 1 tablet (0.4 mg total) under the tongue every 5 (five) minutes as needed for chest pain. 04/24/21  Yes Freada Bergeron, MD  predniSONE (DELTASONE) 5 MG tablet Take 5 mg by mouth daily. 07/25/21  Yes [provider]  ranolazine (RANEXA) 500 MG 12 hr tablet Take  2 tablets (1,000 mg total) by mouth 2 (two) times daily. 05/03/21  Yes Freada Bergeron, MD  Red Yeast Rice Extract (RED YEAST RICE PO) Take 600 mg by mouth 2 (two) times daily.   Yes [provider]  Turmeric 500 MG CAPS Take 1,000 mg by mouth daily.   Yes [provider]  vitamin B-12 (CYANOCOBALAMIN) 1000 MCG tablet Take 3,000 mcg by mouth daily.   Yes [provider]  amoxicillin-clavulanate (AUGMENTIN) 875-125 MG tablet Take 1 tablet by mouth 2 (two) times daily. 08/07/21   Ailene Ards, NP  nitroGLYCERIN (NITRO-DUR) 0.4 mg/hr patch Place 1 patch (0.4 mg total) onto the skin daily. 07/05/21 08/04/21  Freada Bergeron, MD   Social History   Socioeconomic History   Marital status: Married    Spouse name: Not on file   Number of children: Not on file   Years of education: Not on file   Highest education level: Not on file  Occupational History   Not on file  Tobacco Use   Smoking status: Never    Passive exposure: Never   Smokeless tobacco: Never  Vaping Use   Vaping Use: Never used  Substance and Sexual Activity   Alcohol use: No    Alcohol/week: 0.0 standard drinks   Drug use: No   Sexual activity: Yes    Birth control/protection: None   Other Topics Concern   Not on file  Social History Narrative   Married for 12 years.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   Social Determinants of Health   Financial Resource Strain: Not on file  Food Insecurity: Not on file  Transportation Needs: Not on file  Physical Activity: Not on file  Stress: Not on file  Social Connections: Not on file  Intimate Partner Violence: Not on file    Review of Systems  Per HPI.  Objective:   Vitals:   09/04/21 1532  BP: 122/70  Pulse: 67  Resp: 16  Temp: 98.2 F (36.8 C)  TempSrc: Temporal  SpO2: 99%  Weight: 153 lb 12.8 oz (69.8 kg)  Height: 5\' 3"  (1.6 m)     Physical Exam Vitals reviewed.  Constitutional:      Appearance: Normal appearance. She is well-developed.  HENT:     Head: Normocephalic and atraumatic.     Right Ear: Tympanic membrane, ear canal and external ear normal.     Left Ear: Tympanic membrane and external ear normal.     Ears:     Comments: Abrasion with bright red blood at approximately 3:00 at external portion of her left ear canal.  TM intact, no canal edema or discharge other than the small amount of blood. Eyes:     Conjunctiva/sclera: Conjunctivae normal.     Pupils: Pupils are equal, round, and reactive to light.  Neck:     Vascular: No carotid bruit.  Cardiovascular:     Rate and Rhythm: Normal rate and regular rhythm.     Heart sounds: Normal heart sounds.  Pulmonary:     Effort: Pulmonary effort is normal.     Breath sounds: Normal breath sounds.  Abdominal:     Palpations: Abdomen is soft. There is no pulsatile mass.     Tenderness: There is no abdominal tenderness.  Musculoskeletal:     Right lower leg: No edema.     Left lower leg: No edema.  Skin:    General: Skin is warm and dry.  Neurological:  Mental Status: She is alert and oriented to person, place, and time.  Psychiatric:        Mood and Affect: Mood normal.        Behavior: Behavior normal.         Thought Content: Thought content normal.     55 minutes spent during visit, including chart review, discussion of other specialists and care plan, counseling and assimilation of information, exam, discussion of plan, and chart completion.    Assessment & Plan:  Kara Anderson is a 45 y.o. female . Pulmonary valve stenosis, unspecified etiology Takayasu's arteritis ([place]) Vasculitis ([place])  -On treatment regimen as above, with ongoing follow-up with rheumatology, and local as well as [place] [facility]ology.  Plan for hopefully weaning some medications as the Actemra starts to work.  Continue follow-up with specialists.  May need monitoring labs, can check next few months with cholesterol testing.  Mixed hyperlipidemia  -Currently on red yeast rice.  May need to consider for improved control, especially with other medications and potential impact on lipids.  Plan for recheck levels in December, she may have levels checked sooner [facility] or her other specialists  Malaise and fatigue - Plan: buPROPion (WELLBUTRIN XL) 150 MG 24 hr tablet Depressed mood - Plan: buPROPion (WELLBUTRIN XL) 150 MG 24 hr tablet Anhedonia - Plan: buPROPion (WELLBUTRIN XL) 150 MG 24 hr tablet  -Treated with daily lorazepam, but appears to be more of anhedonia/depression type symptoms.  She would like to try treatment to see if some of her fatigue/decreased motivation is more depression related compared to the testosterone, and then decide if she would like to proceed with testosterone pellet treatment.  Also goal of trying to wean to as needed benzodiazepine or off.  Deferred SSRI given previous sexual side effects.  Trial of Wellbutrin 150 mg daily with recheck in the next 1 month.  Potential initial side effects discussed.  Ear canal abrasion, left, initial encounter - Plan: ofloxacin (FLOXIN OTIC) 0.3 % OTIC solution  -Small abrasion noted, start Floxin otic, RTC precautions.  Avoid objects within the ear canal.  Meds  ordered this encounter  Medications   buPROPion (WELLBUTRIN XL) 150 MG 24 hr tablet    Sig: Take 1 tablet (150 mg total) by mouth daily.    Dispense:  30 tablet    Refill:  2   ofloxacin (FLOXIN OTIC) 0.3 % OTIC solution    Sig: Place 5 drops into the left ear daily.    Dispense:  5 mL    Refill:  0   Patient Instructions  Keep follow up with specialists as planned.  Try wellbutrin once per day for now, follow up in 1 month to discuss that med,  lorazepam, and if testosterone is needed to be restarted.   December - repeat cholesterol at that time to discuss possible statin. Return to the clinic or go to the nearest emergency room if any of your symptoms worsen or new symptoms occur.  There is a small abrasion with some bright red blood on your left your canal.  That should improve fairly quickly, but avoid any Q-tips or other objects inside the ear for the next week or 2.  Antibiotic drops 5 drops/day for the next 1 week.  If any pain or discharge from the ear other than a small amount of dried blood, return for recheck here or other care provider.       Signed,   Merri Ray, MD [place], [facility]  [facility] 09/04/21 6:02 PM

## 2021-09-06 DIAGNOSIS — M9903 Segmental and somatic dysfunction of lumbar region: Secondary | ICD-10-CM | POA: Diagnosis not present

## 2021-09-06 DIAGNOSIS — M9902 Segmental and somatic dysfunction of thoracic region: Secondary | ICD-10-CM | POA: Diagnosis not present

## 2021-09-06 DIAGNOSIS — M546 Pain in thoracic spine: Secondary | ICD-10-CM | POA: Diagnosis not present

## 2021-09-06 DIAGNOSIS — M62830 Muscle spasm of back: Secondary | ICD-10-CM | POA: Diagnosis not present

## 2021-09-10 ENCOUNTER — Ambulatory Visit: Payer: BC Managed Care – PPO | Admitting: Registered Nurse

## 2021-09-11 ENCOUNTER — Ambulatory Visit: Payer: BC Managed Care – PPO | Admitting: Internal Medicine

## 2021-09-11 ENCOUNTER — Ambulatory Visit
Admission: RE | Admit: 2021-09-11 | Discharge: 2021-09-11 | Disposition: A | Discharge status: Home / Self Care | Payer: BC Managed Care – PPO | Source: Ambulatory Visit | Attending: Rheumatology | Admitting: Rheumatology

## 2021-09-11 ENCOUNTER — Other Ambulatory Visit: Payer: Self-pay

## 2021-09-11 DIAGNOSIS — N63 Unspecified lump in unspecified breast: Secondary | ICD-10-CM

## 2021-09-11 DIAGNOSIS — R922 Inconclusive mammogram: Secondary | ICD-10-CM | POA: Diagnosis not present

## 2021-09-11 DIAGNOSIS — N6489 Other specified disorders of breast: Secondary | ICD-10-CM | POA: Diagnosis not present

## 2021-09-18 NOTE — Progress Notes (Signed)
Office Visit    Patient Name: Kara Anderson Date of Encounter: 09/19/2021  PCP:  Kara Agreste, MD   [place]  Cardiologist:  Kara Bergeron, MD  Advanced Practice Provider:  No care team member to display Electrophysiologist:  None    Chief Complaint    Kara Anderson is a 45 y.o. 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, pulmonary stenosis s/p stenting, hyperlipidemia, microvascular dysfunction and chronic angina  presents today for follow up of angina   Past Medical History    Past Medical History:  Diagnosis Date   Arthritis    Decreased libido 09/29/2019   Endometriosis 2007,2009   Family history of brain cancer    Family history of breast cancer    Family history of colon cancer    Family history of pancreatic cancer    Heart murmur    HLD (hyperlipidemia) 09/29/2019   Malaise and fatigue 09/29/2019   Mediastinal adenopathy    Mononucleosis    Seasonal allergies    Strep throat    Takayasu's arteritis ([place])    Thyroid disease    Viral meningitis    Vitamin D deficiency disease 09/29/2019   Past Surgical History:  Procedure Laterality Date   HIP SURGERY  2013   IR THORACENTESIS ASP PLEURAL SPACE W/IMG GUIDE  11/20/2017   LAPAROSCOPIC ABDOMINAL EXPLORATION  2007,2009   endometriosis   MEDIASTINOTOMY CHAMBERLAIN MCNEIL    Left 11/16/2017   Procedure: LEFT ANTERIOR MEDIASTINOTOMY CHAMBERLAIN PROCEDURE;  Surgeon: Kara Nakayama, MD;  Location: [facility] OR;  Service: Thoracic;  Laterality: Left;   NOSE SURGERY  2007    Allergies  Allergies  Allergen Reactions   Codeine Nausea Only    History of Present Illness    Kara Anderson is a 45 y.o. 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 Johney Frame.  Previously followed by [facility]  cardiology. Chest CT 07/2019 with patent pulmonary artery stent and no significant CAD. Seen at [facility] clinic and diagnosed with microvascular dysfunction due to persistent substernal pain.   She was last seen by Dr. Johney Frame 07/08/21. She had discontinued Amlodipine due to hypotension and had adjusted dose of Imdur and nitroglycerin patch with improvement in symptoms.   She sent a MyChart message with persistent chest pain requiring PRN PO nitroglycerin despite Imdur 120mg  BID, 0.4mg  nitroglycerin patch. She presents today for follow up. Reports more frequent episodes of chest pain despite routine medications. Without nitroglycerin patch she can't walk from house to her car. Will get symptoms with cleaning chicken coop or walking out buckets of water to them which are only 2.5 gallons each. Will feel a chest pressure in her left chest and up to her left arm. Tells me she is newly having pressure under her left ribcage. This has been gradually progressing over the last 2 months. She is requiring PRN nitroglycerin under her tongue daily often multiple times. No lightheadedness, dizziness, near syncope, nor syncope. Notes her blood pressure has been higher over the last couple months with readings 120s-130s. Wonders if her recent addition of Actemra is contributory.   EKGs/Labs/Other Studies Reviewed:   The following studies were reviewed today:  TTE 01/19/20:  1. Left ventricular ejection fraction, by visual estimation, is 55 to  60%. The left ventricle has normal function. There is no left ventricular  hypertrophy.   2. The left ventricle has no regional wall motion abnormalities.   3. Global right ventricle has normal systolic function.The right  ventricular size is normal. No increase in right ventricular wall  thickness.   4. Left atrial size was normal.   5. Right atrial size was normal.   6. The mitral valve is normal in structure. Mild mitral valve  regurgitation. No evidence of mitral  stenosis.   7. The tricuspid valve is normal in structure.   8. The tricuspid valve is normal in structure. Tricuspid valve  regurgitation is mild.   9. The aortic valve is normal in structure. Aortic valve regurgitation is  not visualized. No evidence of aortic valve sclerosis or stenosis.  10. The pulmonic valve was normal in structure. Pulmonic valve  regurgitation is not visualized.  11. Normal pulmonary artery systolic pressure.  12. The tricuspid regurgitant velocity is 1.66 m/s, and with an assumed  right atrial pressure of 3 mmHg, the estimated right ventricular systolic  pressure is normal at 14.0 mmHg.  13. The inferior vena cava is normal in size with greater than 50%  respiratory variability, suggesting right atrial pressure of 3 mmHg.  14. The average left ventricular global longitudinal strain is 21.2 %.   Carotid ultrasound 10/2017: Final Interpretation:  Right Carotid: There was no evidence of thrombus, dissection,  atherosclerotic                 plaque or stenosis in the cervical carotid system.                 Imaging over the aortic arch shows what appears to be color                 turbulence near the pulmonary artery. Short axis and  subcostal                 views of the heart revealed peak CW gradient of the  pulmonic                 artery in the range of 40-54 mmHg. Pulmonic insufficiency  also                 noted.   Left Carotid: There was no evidence of thrombus, dissection,  atherosclerotic                plaque or stenosis in the cervical carotid system.   Vertebrals:  Both vertebral arteries were patent with antegrade flow.  Subclavians: Normal flow hemodynamics were seen in bilateral subclavian               arteries.               There is also a mobile linear structure in the right  subclavian               vein Discussed               with lead tech who thought structure was a valve but appears  longer               than usual    EKG:   No EKG today.  Recent Labs: 05/09/2021: TSH 2.09 08/02/2021: ALT 17; BUN 16; Creatinine, Ser 0.82; Hemoglobin 12.9; Platelets 279; Potassium 3.8; Sodium 130  Recent Lipid Panel    Component Value Date/Time   CHOL 261 (H) 05/09/2021 1050   TRIG 137 05/09/2021 1050   HDL 69  05/09/2021 1050   CHOLHDL 3.8 05/09/2021 1050   LDLCALC 165 (H) 05/09/2021 1050   Home Medications   Current Meds  Medication Sig   ACTEMRA ACTPEN 162 MG/0.9ML SOAJ Inject 162 mg into the skin every 14 (fourteen) days.   amLODipine (NORVASC) 2.5 MG tablet Take 1 tablet (2.5 mg total) by mouth daily.   buPROPion (WELLBUTRIN XL) 150 MG 24 hr tablet Take 1 tablet (150 mg total) by mouth daily.   celecoxib (CELEBREX) 200 MG capsule Take 200 mg by mouth 2 (two) times daily.   Cholecalciferol (VITAMIN D-3) 125 MCG (5000 UT) TABS Take 5,000 Units by mouth daily.   co-enzyme Q-10 30 MG capsule Take 30 mg by mouth daily.   EC-RX Testosterone 0.2 % CREA Place 15 mg onto the skin daily for 90 days, THEN 5 mg daily. 6% CREAM COMPOUNDED.   folic acid (FOLVITE) 0.5 MG tablet Take 1 mg by mouth daily.   gabapentin (NEURONTIN) 300 MG capsule Take 900 mg by mouth 2 (two) times daily.   isosorbide mononitrate (IMDUR) 120 MG 24 hr tablet Take 120 mg by mouth 2 (two) times daily.   LORazepam (ATIVAN) 0.5 MG tablet Take 1 tablet (0.5 mg total) by mouth 2 (two) times daily as needed for anxiety.   Methotrexate 25 MG/ML SOSY Inject 25 mg into the skin once a week. Mondays.   Multiple Vitamins-Minerals (MULTIVITAMIN ADULT) CHEW Chew 2 tablets by mouth daily.   nitroGLYCERIN (NITRO-DUR) 0.4 mg/hr patch Place 1 patch (0.4 mg total) onto the skin daily.   nitroGLYCERIN (NITROSTAT) 0.4 MG SL tablet Place 1 tablet (0.4 mg total) under the tongue every 5 (five) minutes as needed for chest pain.   omeprazole (PRILOSEC) 40 MG capsule Take 40 mg by mouth daily.   predniSONE (DELTASONE) 5 MG tablet Take 5 mg by mouth daily.   ranolazine (RANEXA)  500 MG 12 hr tablet Take 2 tablets (1,000 mg total) by mouth 2 (two) times daily.   Red Yeast Rice Extract (RED YEAST RICE PO) Take 600 mg by mouth 2 (two) times daily.   Turmeric 500 MG CAPS Take 1,000 mg by mouth daily.   vitamin B-12 (CYANOCOBALAMIN) 1000 MCG tablet Take 3,000 mcg by mouth daily.     Review of Systems      All other systems reviewed and are otherwise negative except as noted above.  Physical Exam    VS:  BP 134/82   Pulse 72   Ht 5\' 3"  (1.6 m)   Wt 156 lb (70.8 kg)   BMI 27.63 kg/m  , BMI Body mass index is 27.63 kg/m.  Wt Readings from Last 3 Encounters:  09/19/21 156 lb (70.8 kg)  09/04/21 153 lb 12.8 oz (69.8 kg)  08/02/21 151 lb (68.5 kg)    GEN: Well nourished, well developed, in no acute distress. HEENT: normal. Neck: Supple, no JVD, carotid bruits, or masses. Cardiac: RRR, no murmurs, rubs, or gallops. No clubbing, cyanosis, edema.  Radials/PT 2+ and equal bilaterally.  Respiratory:  Respirations regular and unlabored, clear to auscultation bilaterally. GI: Soft, nontender, nondistended. MS: No deformity or atrophy. Skin: Warm and dry, no rash. Neuro:  Strength and sensation are intact. Psych: Normal affect.  Assessment & Plan    Microvascular dysfunction with angina - Worsening anginal symptoms. Presently taking above recommended daily dose of Imdur/Nitroglycerin. Suspect nitro tolerance. Diagonsed initially at [facility] clinic. Previous medical therapy limited by hypotension. Continue Ranexa 1000mg  BID, Imdur 120mg  BID, nitro patch 0.4mg  daily.  As BP has room, will add Amlodipine 2.5mg  QD. Previously had to reduce due to hypotension. Follow up via MyChart in 1 week. If BP tolerates, plan to increase Amlodipine and reduce Nitro patch at that time. Future considerations include addition of beta blocker.   Pulmonary artery stenosis s/p stenting 09/09/18 - Follows with [facility]. 06/2018 echo increased velocities through pulmonary artery peak MPA velocity 2.45  m/s, peak gradient 15mmHg. 06/2018 chest MRI stenosis of R and L main pulmonary arteries likely related to prior Takayasu arteritis. 08/2018 RHC with RA mean 7 mmHg, RV 33/6 mmHg, PA 32/18 mean 17, PWP -39mmHg, index 4.6 PTA of left pulmonary artery. 08/2018 EF >55%, dilated PA. Continue Aspirin, Imdur.   Takayatsu Arteritis - Follows with rheumatology on methotrexate.   Disposition: Follow up in 2 weeks.  Signed, Loel Dubonnet, NP 09/19/2021, 8:34 PM [facility]

## 2021-09-19 ENCOUNTER — Encounter: Payer: BC Managed Care – PPO | Admitting: Gastroenterology

## 2021-09-19 ENCOUNTER — Encounter (HOSPITAL_BASED_OUTPATIENT_CLINIC_OR_DEPARTMENT_OTHER): Payer: Self-pay | Admitting: Family

## 2021-09-19 ENCOUNTER — Ambulatory Visit (INDEPENDENT_AMBULATORY_CARE_PROVIDER_SITE_OTHER): Payer: BC Managed Care – PPO | Admitting: Family

## 2021-09-19 ENCOUNTER — Other Ambulatory Visit: Payer: Self-pay

## 2021-09-19 VITALS — BP 134/82 | HR 72 | Ht 63.0 in | Wt 156.0 lb

## 2021-09-19 DIAGNOSIS — M314 Aortic arch syndrome [Takayasu]: Secondary | ICD-10-CM

## 2021-09-19 DIAGNOSIS — I208 Other forms of angina pectoris: Secondary | ICD-10-CM

## 2021-09-19 DIAGNOSIS — Q256 Stenosis of pulmonary artery: Secondary | ICD-10-CM

## 2021-09-19 MED ORDER — AMLODIPINE BESYLATE 2.5 MG PO TABS: 2.5000 mg | ORAL_TABLET | Freq: Every day | ORAL | 2 refills | Status: DC

## 2021-09-19 NOTE — Patient Instructions (Signed)
Medication Instructions:  Your physician has recommended you make the following change in your medication:   START Amlodipine one 2.5mg  tablet daily  *If you need a refill on your cardiac medications before your next appointment, please call your pharmacy*  Lab Work: None ordered today.   Testing/Procedures: Your EKG today showed normal sinus rhythm which is a great result!  Follow-Up: At [facility], you and your health needs are our priority.  As part of our continuing mission to provide you with exceptional heart care, we have created designated Provider Care Teams.  These Care Teams include your primary Cardiologist (physician) and Advanced Practice Providers (APPs -  Physician Assistants and Nurse Practitioners) who all work together to provide you with the care you need, when you need it.  We recommend signing up for the patient portal called "MyChart".  Sign up information is provided on this After Visit Summary.  MyChart is used to connect with patients for Virtual Visits (Telemedicine).  Patients are able to view lab/test results, encounter notes, upcoming appointments, etc.  Non-urgent messages can be sent to your provider as well.   To learn more about what you can do with MyChart, go to [email or web address].    Your next appointment:   2 week(s)  The format for your next appointment:   In Person  Provider:   Loel Dubonnet, NP    Other Instructions  Heart Healthy Diet Recommendations: A low-salt diet is recommended. Meats should be grilled, baked, or boiled. Avoid fried foods. Focus on lean protein sources like fish or chicken with vegetables and fruits. The American Heart Association is a Microbiologist!    Exercise recommendations: The American Heart Association recommends 150 minutes of moderate intensity exercise weekly. Try 30 minutes of moderate intensity exercise 4-5 times per week. This could include walking, jogging, or swimming.

## 2021-09-25 ENCOUNTER — Encounter: Payer: Self-pay | Admitting: Family Medicine

## 2021-09-26 ENCOUNTER — Encounter (HOSPITAL_BASED_OUTPATIENT_CLINIC_OR_DEPARTMENT_OTHER): Payer: Self-pay

## 2021-09-26 DIAGNOSIS — I208 Other forms of angina pectoris: Secondary | ICD-10-CM

## 2021-09-27 DIAGNOSIS — M546 Pain in thoracic spine: Secondary | ICD-10-CM | POA: Diagnosis not present

## 2021-09-27 DIAGNOSIS — M62830 Muscle spasm of back: Secondary | ICD-10-CM | POA: Diagnosis not present

## 2021-09-27 DIAGNOSIS — M9903 Segmental and somatic dysfunction of lumbar region: Secondary | ICD-10-CM | POA: Diagnosis not present

## 2021-09-27 DIAGNOSIS — M9902 Segmental and somatic dysfunction of thoracic region: Secondary | ICD-10-CM | POA: Diagnosis not present

## 2021-09-27 MED ORDER — NITROGLYCERIN 0.2 MG/HR TD PT24: 0.2000 mg | MEDICATED_PATCH | Freq: Every day | TRANSDERMAL | 12 refills | Status: DC

## 2021-09-27 MED ORDER — AMLODIPINE BESYLATE 5 MG PO TABS: 5.0000 mg | ORAL_TABLET | Freq: Every day | ORAL | 2 refills | Status: DC

## 2021-10-01 DIAGNOSIS — Z5181 Encounter for therapeutic drug level monitoring: Secondary | ICD-10-CM | POA: Diagnosis not present

## 2021-10-01 DIAGNOSIS — M0600 Rheumatoid arthritis without rheumatoid factor, unspecified site: Secondary | ICD-10-CM | POA: Diagnosis not present

## 2021-10-01 DIAGNOSIS — I776 Arteritis, unspecified: Secondary | ICD-10-CM | POA: Diagnosis not present

## 2021-10-03 DIAGNOSIS — R6882 Decreased libido: Secondary | ICD-10-CM | POA: Diagnosis not present

## 2021-10-03 DIAGNOSIS — Z6826 Body mass index (BMI) 26.0-26.9, adult: Secondary | ICD-10-CM | POA: Diagnosis not present

## 2021-10-03 DIAGNOSIS — N951 Menopausal and female climacteric states: Secondary | ICD-10-CM | POA: Diagnosis not present

## 2021-10-03 DIAGNOSIS — M2550 Pain in unspecified joint: Secondary | ICD-10-CM | POA: Diagnosis not present

## 2021-10-04 ENCOUNTER — Ambulatory Visit (INDEPENDENT_AMBULATORY_CARE_PROVIDER_SITE_OTHER): Payer: BC Managed Care – PPO | Admitting: Family

## 2021-10-04 ENCOUNTER — Encounter (HOSPITAL_BASED_OUTPATIENT_CLINIC_OR_DEPARTMENT_OTHER): Payer: Self-pay | Admitting: Family

## 2021-10-04 ENCOUNTER — Other Ambulatory Visit: Payer: Self-pay

## 2021-10-04 VITALS — BP 102/72 | HR 80 | Ht 63.0 in | Wt 156.0 lb

## 2021-10-04 DIAGNOSIS — I208 Other forms of angina pectoris: Secondary | ICD-10-CM

## 2021-10-04 DIAGNOSIS — Q256 Stenosis of pulmonary artery: Secondary | ICD-10-CM

## 2021-10-04 DIAGNOSIS — M314 Aortic arch syndrome [Takayasu]: Secondary | ICD-10-CM | POA: Diagnosis not present

## 2021-10-04 MED ORDER — METOPROLOL SUCCINATE ER 25 MG PO TB24: 12.5000 mg | ORAL_TABLET | Freq: Every day | ORAL | 2 refills | Status: DC

## 2021-10-04 MED ORDER — AMLODIPINE BESYLATE 2.5 MG PO TABS: 2.5000 mg | ORAL_TABLET | Freq: Every day | ORAL | 2 refills | Status: DC

## 2021-10-04 NOTE — Progress Notes (Signed)
Office Visit    Patient Name: Kara Anderson Date of Encounter: 10/04/2021  PCP:  Wendie Agreste, MD   [place]  Cardiologist:  Freada Bergeron, MD  Advanced Practice Provider:  No care team member to display Electrophysiologist:  None    Chief Complaint    Kara Anderson is a 45 y.o. 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, pulmonary stenosis s/p stenting, hyperlipidemia, microvascular dysfunction and chronic angina  presents today for follow up of angina   Past Medical History    Past Medical History:  Diagnosis Date   Arthritis    Decreased libido 09/29/2019   Endometriosis 2007,2009   Family history of brain cancer    Family history of breast cancer    Family history of colon cancer    Family history of pancreatic cancer    Heart murmur    HLD (hyperlipidemia) 09/29/2019   Malaise and fatigue 09/29/2019   Mediastinal adenopathy    Mononucleosis    Seasonal allergies    Strep throat    Takayasu's arteritis ([place])    Thyroid disease    Viral meningitis    Vitamin D deficiency disease 09/29/2019   Past Surgical History:  Procedure Laterality Date   HIP SURGERY  2013   IR THORACENTESIS ASP PLEURAL SPACE W/IMG GUIDE  11/20/2017   LAPAROSCOPIC ABDOMINAL EXPLORATION  2007,2009   endometriosis   MEDIASTINOTOMY CHAMBERLAIN MCNEIL    Left 11/16/2017   Procedure: LEFT ANTERIOR MEDIASTINOTOMY CHAMBERLAIN PROCEDURE;  Surgeon: [name], MD;  Location: [facility] OR;  Service: Thoracic;  Laterality: Left;   NOSE SURGERY  2007    Allergies  Allergies  Allergen Reactions   Codeine Nausea Only    History of Present Illness    Kara Anderson is a 45 y.o. 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 Johney Frame.  Previously followed by [facility]  cardiology. Chest CT 07/2019 with patent pulmonary artery stent and no significant CAD. Seen at [facility] clinic and diagnosed with microvascular dysfunction due to persistent substernal pain.   She was seen by Dr. Johney Frame 07/08/21. She had discontinued Amlodipine due to hypotension and had adjusted dose of Imdur and nitroglycerin patch with improvement in symptoms.   Seen 09/19/21 with persistent angina despite PRN PO nitroglycerin, Imdur 120mg  BID, and 0.4mg  nitroglycerin patch. Amlodipine 2.5mg  QD was initiated. Via MyChart message 09/26/21 Amlodipine was increased to 5mg  QD and nitro patch reduced to 0.2mg .   She presents today for follow up. Her symptoms have improved since last seen. Tells me yesterday she tried to ride a stationary bike and felt significant shortness of breath. Felt different than being "out of shape" but felt she couldn't breath well. She lowered the resistance and continued but then while driving afterwards was having chest pain similar to her previous. She took one PO nitroglycerin and symptoms improved. Tells me the 2.5mg  dose of Amlodipine she felt well. On the 5mg  she tells me she feels "spacey". She is wearing the 0.2mg  nitroglycerin patch. 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  EKGs/Labs/Other Studies Reviewed:   The following studies were reviewed today:  TTE 01/19/20:  1. Left ventricular ejection fraction, by visual estimation, is 55 to  60%. The left ventricle has normal function. There  is no left ventricular  hypertrophy.   2. The left ventricle has no regional wall motion abnormalities.   3. Global right ventricle has normal systolic function.The right  ventricular size is normal. No increase in right ventricular wall  thickness.   4. Left atrial size was normal.   5. Right atrial size was normal.   6. The mitral valve is normal in structure. Mild mitral valve  regurgitation. No evidence of mitral  stenosis.   7. The tricuspid valve is normal in structure.   8. The tricuspid valve is normal in structure. Tricuspid valve  regurgitation is mild.   9. The aortic valve is normal in structure. Aortic valve regurgitation is  not visualized. No evidence of aortic valve sclerosis or stenosis.  10. The pulmonic valve was normal in structure. Pulmonic valve  regurgitation is not visualized.  11. Normal pulmonary artery systolic pressure.  12. The tricuspid regurgitant velocity is 1.66 m/s, and with an assumed  right atrial pressure of 3 mmHg, the estimated right ventricular systolic  pressure is normal at 14.0 mmHg.  13. The inferior vena cava is normal in size with greater than 50%  respiratory variability, suggesting right atrial pressure of 3 mmHg.  14. The average left ventricular global longitudinal strain is 21.2 %.   Carotid ultrasound 10/2017: Final Interpretation:  Right Carotid: There was no evidence of thrombus, dissection,  atherosclerotic                 plaque or stenosis in the cervical carotid system.                 Imaging over the aortic arch shows what appears to be color                 turbulence near the pulmonary artery. Short axis and  subcostal                 views of the heart revealed peak CW gradient of the  pulmonic                 artery in the range of 40-54 mmHg. Pulmonic insufficiency  also                 noted.   Left Carotid: There was no evidence of thrombus, dissection,  atherosclerotic                plaque or stenosis in the cervical carotid system.   Vertebrals:  Both vertebral arteries were patent with antegrade flow.  Subclavians: Normal flow hemodynamics were seen in bilateral subclavian              arteries.               There is also a mobile linear structure in the right  subclavian               vein Discussed   with lead tech who thought structure was a valve but appears longer than usual   EKG:  No EKG today.  Recent  Labs: 05/09/2021: TSH 2.09 08/02/2021: ALT 17; BUN 16; Creatinine, Ser 0.82; Hemoglobin 12.9; Platelets 279; Potassium 3.8; Sodium 130  Recent Lipid Panel    Component Value Date/Time   CHOL 261 (H) 05/09/2021 1050   TRIG 137 05/09/2021 1050   HDL 69 05/09/2021 1050   CHOLHDL 3.8 05/09/2021 1050   LDLCALC 165 (H) 05/09/2021 1050   Home Medications   Current Meds  Medication Sig   ACTEMRA ACTPEN 162 MG/0.9ML SOAJ Inject 162 mg into the skin every 14 (fourteen) days.   amLODipine (NORVASC) 2.5 MG tablet Take 1 tablet (2.5 mg total) by mouth daily.   buPROPion (WELLBUTRIN XL) 150 MG 24 hr tablet Take 1 tablet (150 mg total) by mouth daily.   celecoxib (CELEBREX) 200 MG capsule Take 200 mg by mouth 2 (two) times daily.   Cholecalciferol (VITAMIN D-3) 125 MCG (5000 UT) TABS Take 5,000 Units by mouth daily.   co-enzyme Q-10 30 MG capsule Take 30 mg by mouth daily.   folic acid (FOLVITE) 0.5 MG tablet Take 1 mg by mouth daily.   gabapentin (NEURONTIN) 300 MG capsule Take 900 mg by mouth 2 (two) times daily.   isosorbide mononitrate (IMDUR) 120 MG 24 hr tablet Take 120 mg by mouth 2 (two) times daily.   LORazepam (ATIVAN) 0.5 MG tablet Take 1 tablet (0.5 mg total) by mouth 2 (two) times daily as needed for anxiety.   Methotrexate 25 MG/ML SOSY Inject 25 mg into the skin once a week. Mondays.   metoprolol succinate (TOPROL XL) 25 MG 24 hr tablet Take 0.5 tablets (12.5 mg total) by mouth daily.   Multiple Vitamins-Minerals (MULTIVITAMIN ADULT) CHEW Chew 2 tablets by mouth daily.   nitroGLYCERIN (NITRO-DUR) 0.2 mg/hr patch Place 1 patch (0.2 mg total) onto the skin daily.   nitroGLYCERIN (NITROSTAT) 0.4 MG SL tablet Place 1 tablet (0.4 mg total) under the tongue every 5 (five) minutes as needed for chest pain.   omeprazole (PRILOSEC) 40 MG capsule Take 40 mg by mouth daily.   ranolazine (RANEXA) 500 MG 12 hr tablet Take 2 tablets (1,000 mg total) by mouth 2 (two) times daily.   Red Yeast Rice  Extract (RED YEAST RICE PO) Take 600 mg by mouth 2 (two) times daily.   Turmeric 500 MG CAPS Take 1,000 mg by mouth daily.   vitamin B-12 (CYANOCOBALAMIN) 1000 MCG tablet Take 3,000 mcg by mouth daily.   [DISCONTINUED] amLODipine (NORVASC) 5 MG tablet Take 1 tablet (5 mg total) by mouth daily.     Review of Systems      All other systems reviewed and are otherwise negative except as noted above.  Physical Exam    VS:  BP 102/72   Pulse 80   Ht 5\' 3"  (1.6 m)   Wt 156 lb (70.8 kg)   SpO2 99%   BMI 27.63 kg/m  , BMI Body mass index is 27.63 kg/m.  Wt Readings from Last 3 Encounters:  10/04/21 156 lb (70.8 kg)  09/19/21 156 lb (70.8 kg)  09/04/21 153 lb 12.8 oz (69.8 kg)    GEN: Well nourished, well developed, in no acute distress. HEENT: normal. Neck: Supple, no JVD, carotid bruits, or masses. Cardiac: RRR, no murmurs, rubs, or gallops. No clubbing, cyanosis, edema.  Radials/PT 2+ and equal bilaterally.  Respiratory:  Respirations regular and unlabored, clear to auscultation bilaterally. GI: Soft, nontender, nondistended. MS: No deformity or atrophy. Skin: Warm and dry, no rash. Neuro:  Strength and sensation are intact. Psych: Normal affect.  Assessment & Plan    Microvascular dysfunction with angina - Diagnosed initially at [facility]. Worsening anginal symptoms. Presently taking above recommended daily dose of Imdur/Nitroglycerin. Suspect nitro tolerance. Diagonsed initially at [facility] clinic. Previous medical therapy limited by hypotension. Continue Ranexa 1000mg  BID, Imdur 120mg  BID, nitro patch 0.2mg  daily. Does not feel well on Amlodipine 5mg  so will reduce to 2.5mg  QD. Will add  Toprol 12.5mg  daily for anti anginal benefit. Check in via MyChart 1 week and could consider further increasing Toprol dose and reducing Nitroglycerin. A  Pulmonary artery stenosis s/p stenting 09/09/18 - Follows with [facility]. 06/2018 echo increased velocities through pulmonary artery peak MPA velocity  2.45 m/s, peak gradient 88mmHg. 06/2018 chest MRI stenosis of R and L main pulmonary arteries likely related to prior Takayasu arteritis. 08/2018 RHC with RA mean 7 mmHg, RV 33/6 mmHg, PA 32/18 mean 17, PWP -63mmHg, index 4.6 PTA of left pulmonary artery. 08/2018 EF >55%, dilated PA. Continue Aspirin, Imdur.   Takayatsu Arteritis - Follows with rheumatology on methotrexate.   Hyperlipidemia - 10/01/21 total cholesterol 302, LDL 185, HDL 94, triglycerides 117. 10 year ASCVD risk score 0.6%.  Will not start statin therapy at this time as her ASCVD risk score is low, concern for mylagia with underlying autoimmune disease. She follows a heart healthy diet and stays active despite her anginal limitations.   Disposition: Follow up in 6-8 weeks with Dr. Johney Frame or APP  Signed, Loel Dubonnet, NP 10/04/2021, 11:46 AM [place]

## 2021-10-04 NOTE — Patient Instructions (Signed)
Medication Instructions:  Your physician has recommended you make the following change in your medication:   REDUCE Amlodipine to 2.5mg  daily  START Metoprolol Succinate half tablet (12.5mg ) daily   Send [place] an update in a week via MyChart to let us know how you are feeling. We can consider further increasing the Metoprolol and decreasing your nitroglycerin patch.   *If you need a refill on your cardiac medications before your next appointment, please call your pharmacy*  Lab Work: None ordered today.   Testing/Procedures: None ordered today.   Follow-Up: At [facility], you and your health needs are our priority.  As part of our continuing mission to provide you with exceptional heart care, we have created designated Provider Care Teams.  These Care Teams include your primary Cardiologist (physician) and Advanced Practice Providers (APPs -  Physician Assistants and Nurse Practitioners) who all work together to provide you with the care you need, when you need it.  We recommend signing up for the patient portal called "MyChart".  Sign up information is provided on this After Visit Summary.  MyChart is used to connect with patients for Virtual Visits (Telemedicine).  Patients are able to view lab/test results, encounter notes, upcoming appointments, etc.  Non-urgent messages can be sent to your provider as well.   To learn more about what you can do with MyChart, go to [email or web address].    Your next appointment:   6-8 week(s)  The format for your next appointment:   In Person  Provider:   You may see Freada Bergeron, MD or one of the following Advanced Practice Providers on your designated Care Team:   Richardson Dopp, PA-C [place], [place] Loel Dubonnet, NP     Other Instructions  Coronary Microvascular Disease Coronary microvascular disease (MVD), sometimes called small artery disease or small vessel disease, is heart disease in which the walls and inner lining  of the heart's small arteries are damaged. The damage may lead to a tightening (spasm) of the arteries, which causes decreased blood flow to the heart. The tiny arteries of the heart may also have abnormalities that decrease the blood flow to the heart muscle.  MVD is different from traditional heart disease, or coronary artery disease (CAD), in which plaque builds up in the large arteries of the heart. The risk factors for MVD are similar to those for CAD, and microvascular disease is more common in women. What are the causes? This condition is caused by damage to the walls and inner lining of the small arteries of the heart. The exact cause of the damage is often not known. What increases the risk? Some things that may increase the risk for MVD include: High blood pressure (hypertension). Diabetes. Smoking. Very high cholesterol levels. Being overweight or obese. Inactivity (sedentary lifestyle). An unhealthy diet. A family history of early heart disease. Being a female. What are the signs or symptoms? Symptoms of this condition include: Chest pain (angina). The pain can: Feel like a burning, squeezing, or pressure sensation. Spread (radiate) to the neck, arm, back, or jaw. Differ from other angina in these ways: It usually lasts longer than 10 minutes and can last longer than 30 minutes. It may occur along with shortness of breath, sleep problems, fatigue, and lack of energy. It is usually first noticed during routine daily activities or times of mental stress. Nausea. Weakness. Dizziness. Sweating. Fluttering or fast heartbeat (palpitations). How is this diagnosed? This condition may be diagnosed based on: Your medical  history, including evaluation for risk factors. A physical exam. Tests, such as: Blood tests. Electrocardiogram (ECG). This test checks the electrical activity in the heart. MRI of the heart (cardiac MRI). This imaging test shows the function and structure of  the heart and blood vessels (cardiovascular system). An exercise stress test. For this test, your heart function and blood pressure are monitored before, during, and after exercise. An ECG is used during this test. Coronary angiogram with additional blood flow testing. For this procedure, dye is injected into your coronary arteries before X-rays are taken to check blood flow and heart function. PET scan of the heart (cardiac PET scan). This imaging test makes pictures to show healthy and damaged heart muscle. Sometimes, standard tests do not show MVD. Standard coronary angiogram only shows blockages in the large arteries. You may still be diagnosed with MVD, even if the angiogram results are normal. How is this treated? This condition is managed with medicines, such as medicines to: Control cholesterol levels (statin medicines). Lower blood pressure (ACE inhibitors). Prevent blood clots or control inflammation. Treat angina and improve blood flow to the heart (nitroglycerin). Relax blood vessels (beta blockers or calcium channel blockers). Follow these instructions at home: Lifestyle  Be physically active every day. Ask your health care provider how much physical activity you need and what types of exercise are best for you. Do not use any products that contain nicotine or tobacco. These products include cigarettes, chewing tobacco, and vaping devices, such as e-cigarettes. If you need help quitting, ask your health care provider. Follow a heart-healthy diet. A dietitian can help you learn about healthy food options and changes. In general, eat plenty of fruits and vegetables, lean meats, and whole grains. General instructions Take over-the-counter and prescription medicines only as told by your health care provider. Maintain a healthy weight or work with your health care provider to lose weight safely if needed. Manage any other health conditions that affect your heart, such as hypertension and  diabetes. Keep all follow-up visits. This is important. Contact a health care provider if: You have trouble doing your daily activities. You feel nauseous. You feel light-headed. You have a fever or chills. Get help right away if: You have discomfort in the center of your chest that: Lasts for more than a few minutes. Goes away and comes back (recurs). You have shortness of breath. You have pressure, pain, or a sense of fullness or squeezing in your chest. These symptoms may represent a serious problem that is an emergency. Do not wait to see if the symptoms will go away. Get medical help right away. Call your local emergency services (911 in the [place] Do not drive yourself to the hospital. Summary Coronary microvascular disease (MVD) is heart disease in which the walls and inner lining of the heart's small arteries are damaged. Symptoms of this condition include chest pain (angina). This usually lasts at least 10 minutes and can last longer than 30 minutes. This condition is diagnosed by your health care provider based on your medical history, a physical exam, and test results. This condition is managed with medicines and other lifestyle changes. This information is not intended to replace advice given to you by your health care provider. Make sure you discuss any questions you have with your health care provider. Document Revised: 05/23/2020 Document Reviewed: 05/23/2020 Elsevier Patient Education  [place].

## 2021-10-08 ENCOUNTER — Ambulatory Visit (HOSPITAL_BASED_OUTPATIENT_CLINIC_OR_DEPARTMENT_OTHER): Payer: BC Managed Care – PPO | Admitting: Family

## 2021-10-08 MED ORDER — NITROGLYCERIN 0.1 MG/HR TD PT24: 0.1000 mg | MEDICATED_PATCH | Freq: Every day | TRANSDERMAL | 1 refills | Status: DC

## 2021-10-08 NOTE — Addendum Note (Signed)
Addended by: Loel Dubonnet on: 10/08/2021 04:43 PM   Modules accepted: Orders

## 2021-10-09 ENCOUNTER — Other Ambulatory Visit (HOSPITAL_BASED_OUTPATIENT_CLINIC_OR_DEPARTMENT_OTHER): Payer: Self-pay | Admitting: Family

## 2021-10-16 ENCOUNTER — Ambulatory Visit (INDEPENDENT_AMBULATORY_CARE_PROVIDER_SITE_OTHER): Payer: BC Managed Care – PPO | Admitting: Family Medicine

## 2021-10-16 ENCOUNTER — Other Ambulatory Visit: Payer: Self-pay

## 2021-10-16 ENCOUNTER — Encounter: Payer: Self-pay | Admitting: Family Medicine

## 2021-10-16 VITALS — BP 108/70 | HR 71 | Temp 98.0°F | Resp 15 | Ht 63.0 in | Wt 157.4 lb

## 2021-10-16 DIAGNOSIS — R4589 Other symptoms and signs involving emotional state: Secondary | ICD-10-CM

## 2021-10-16 DIAGNOSIS — R4584 Anhedonia: Secondary | ICD-10-CM | POA: Diagnosis not present

## 2021-10-16 DIAGNOSIS — R5383 Other fatigue: Secondary | ICD-10-CM

## 2021-10-16 DIAGNOSIS — S00412A Abrasion of left ear, initial encounter: Secondary | ICD-10-CM

## 2021-10-16 DIAGNOSIS — R059 Cough, unspecified: Secondary | ICD-10-CM

## 2021-10-16 DIAGNOSIS — Z23 Encounter for immunization: Secondary | ICD-10-CM

## 2021-10-16 DIAGNOSIS — R0789 Other chest pain: Secondary | ICD-10-CM | POA: Diagnosis not present

## 2021-10-16 DIAGNOSIS — R5381 Other malaise: Secondary | ICD-10-CM

## 2021-10-16 MED ORDER — BENZONATATE 100 MG PO CAPS
100.0000 mg | ORAL_CAPSULE | Freq: Three times a day (TID) | ORAL | 0 refills | Status: DC | PRN
Start: 2021-10-16 — End: 2021-11-27

## 2021-10-16 MED ORDER — AZITHROMYCIN 250 MG PO TABS: ORAL_TABLET | ORAL | 0 refills | Status: DC

## 2021-10-16 NOTE — Patient Instructions (Addendum)
  Avoid use of cotton tip swabs to allow the left ear canal to heal.  In the next 2 weeks if you continue to notice any bleeding after no use of cotton tip swabs, return and I will recheck that area.  Soreness on left chest, left abdominal area could be related to persistent cough.  Start antibiotic, Tessalon Perles 3 times per day as needed.  If that soreness is not improving in the next week to 10 days at the most, or any worsening symptoms/new symptoms sooner please return for recheck.  No new medications for anxiety/depression at this time.  We can discuss this further at your follow-up visit in 1 month.  Keep me posted.  Hope you feel better soon.   Chest Wall Pain Chest wall pain is pain in or around the bones and muscles of your chest. Sometimes, an injury causes this pain. Excessive coughing or overuse of arm and chest muscles may also cause chest wall pain. Sometimes, the cause may not be known. This pain may take several weeks or longer to get better. Follow these instructions at home: Managing pain, stiffness, and swelling  If directed, put ice on the painful area: Put ice in a plastic bag. Place a towel between your skin and the bag. Leave the ice on for 20 minutes, 2-3 times per day. Activity Rest as told by your health care provider. Avoid activities that cause pain. These include any activities that use your chest muscles or your abdominal and side muscles to lift heavy items. Ask your health care provider what activities are safe for you. General instructions  Take over-the-counter and prescription medicines only as told by your health care provider. Do not use any products that contain nicotine or tobacco, such as cigarettes, e-cigarettes, and chewing tobacco. These can delay healing after injury. If you need help quitting, ask your health care provider. Keep all follow-up visits as told by your health care provider. This is important. Contact a health care provider if: You  have a fever. Your chest pain becomes worse. You have new symptoms. Get help right away if: You have nausea or vomiting. You feel sweaty or light-headed. You have a cough with mucus from your lungs (sputum) or you cough up blood. You develop shortness of breath. These symptoms may represent a serious problem that is an emergency. Do not wait to see if the symptoms will go away. Get medical help right away. Call your local emergency services (911 in the [place] Do not drive yourself to the hospital. Summary Chest wall pain is pain in or around the bones and muscles of your chest. Depending on the cause, it may be treated with ice, rest, medicines, and avoiding activities that cause pain. Contact a health care provider if you have a fever, worsening chest pain, or new symptoms. Get help right away if you feel light-headed or you develop shortness of breath. These symptoms may be an emergency. This information is not intended to replace advice given to you by your health care provider. Make sure you discuss any questions you have with your health care provider. Document Revised: 02/15/2021 Document Reviewed: 02/15/2021 Elsevier Patient Education  Kara Anderson.

## 2021-10-16 NOTE — Progress Notes (Signed)
Subjective:  Patient ID: Kara Anderson, female    DOB: Nov 28, 1976  Age: 45 y.o. MRN: [id number]  CC:  Chief Complaint  Patient presents with   Depression    Pt reports tried Welbutrin and felt it made her feelings worse, caused more severe depression symptoms, pt trialed apx 3 weeks due to side effects, better since stopping. Has been taking testosterone pellets states she had discussed. Pt reports would like to stay with lorazepam and testosterone and not start anything else    Ear Injury    Pt reports still having some dried blood in her ear when she will clean them out with Qtip, notes not dark cerumen    Chest Pain    Pt is having pressure across lower ribcage she will get tightness or pressure with exertion and at night notes pain when laying on Lt side, Cardiologist does not believe heart related    Immunizations    Pt would like to discuss pneumonia vaccine today     HPI Kara Anderson presents for   Concerns above  Anxiety Discussed at her last visit when establishing care in September.  Did not like previous SSRI.  Some depression symptoms at that time.  Had been taking daily lorazepam.  Trial of Wellbutrin 150 mg daily. Has not met with psychiatry in past.  Took for 2.5 weeks. Felt more depressed, not her self. Stopped meds.  Still taking lorazepam 1/2 per day, and feels better now that she is taking testosterone pellets. [facility].  Unable to participate in life she wants due to disease limitations.  Has started meeting with preacher which has been helpful. Fatigue and joint aches.  GAD 7 : Generalized Anxiety Score 10/16/2021 09/04/2021  Nervous, Anxious, on Edge 0 0  Control/stop worrying 0 0  Worry too much - different things 0 1  Trouble relaxing 0 0  Restless 1 1  Easily annoyed or irritable 0 1  Afraid - awful might happen 0 0  Total GAD 7 Score 1 3   Depression screen [facility] 2/9 10/16/2021 09/04/2021 05/09/2021 12/08/2019  Decreased Interest 1 1 0 0  Down,  Depressed, Hopeless 1 0 0 0  PHQ - 2 Score 2 1 0 0  Altered sleeping 1 0 0 -  Tired, decreased energy 1 1 0 -  Change in appetite 1 1 0 -  Feeling bad or failure about yourself  1 0 0 -  Trouble concentrating 1 0 0 -  Moving slowly or fidgety/restless 0 0 0 -  Suicidal thoughts 0 0 0 -  PHQ-9 Score 7 3 0 -  Difficult doing work/chores - - Not difficult at all -    Chest pain Has discussed with her cardiologist, Cardiology note reviewed from 10/04/2021.  Amlodipine was decreased to 2.5 mg daily, metoprolol 12.5 mg started.  She has a history of microvascular dysfunction with angina previous therapy limited by hypotension per cardiology note, initially diagnosed at [facility].  Was continued on Ranexa 1000 mg twice daily, Imdur 120 mg twice daily Nitropatch 0.2 mg daily.  MyChart follow-up in 1 week planned.  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  Chest pain improved with change in meds by cardiology.  Notes pressure sensation in lower rib area on left side, moves across to diaphragm area. Note with lying on side, or with activity. Full feeling after meal. No fever. Discussed soreness with cardiology - thought to be muscular.  some  cough, a lot in am past few weeks, some green mucus d/c from nose, some thick yellow phlegm past few weeks. About the same. Tx: nasal cleanse - tap water, home filtration system (does this daily). No n/v or breakthrough heartburn. On omeprazole 78m, and celebrex.  Dried blood in the ear Left side of canal with cleaning ear with Qtip.  No pain or d/c form the ear. Hearing ok.   Considering pna vaccine next visit.  History Patient Active Problem List   Diagnosis Date Noted   Family history of breast cancer 08/28/2021   Family history of brain cancer 08/28/2021   Family history of colon cancer 08/28/2021   Family history of pancreatic cancer 08/28/2021   HLD (hyperlipidemia) 09/29/2019   Vitamin D  deficiency disease 09/29/2019   Malaise and fatigue 09/29/2019   Decreased libido 09/29/2019   [place]u's arteritis ([place] 10/14/2018   Vasculitis ([facility] 12/19/2017   Mediastinal mass 11/04/2017   Pulmonary stenosis 11/04/2017   Shortness of breath 10/31/2017   Bruit 10/31/2017   Murmur 10/31/2017   Breast mass, right 10/29/2015   Past Medical History:  Diagnosis Date   Arthritis    Decreased libido 09/29/2019   Endometriosis 20017,4944  Family history of brain cancer    Family history of breast cancer    Family history of colon cancer    Family history of pancreatic cancer    Heart murmur    HLD (hyperlipidemia) 09/29/2019   Malaise and fatigue 09/29/2019   Mediastinal adenopathy    Mononucleosis    Seasonal allergies    Strep throat    Takayasu's arteritis ([place]    Thyroid disease    Viral meningitis    Vitamin D deficiency disease 09/29/2019   Past Surgical History:  Procedure Laterality Date   HIP SURGERY  2013   IR THORACENTESIS ASP PLEURAL SPACE W/IMG GUIDE  11/20/2017   LAPAROSCOPIC ABDOMINAL EXPLORATION  2007,2009   endometriosis   MEDIASTINOTOMY CHAMBERLAIN MCNEIL    Left 11/16/2017   Procedure: LEFT ANTERIOR MEDIASTINOTOMY CHAMBERLAIN PROCEDURE;  Surgeon: HMelrose Nakayama MD;  [facility]  Service: Thoracic;  Laterality: Left;   NOSE SURGERY  2007   Allergies  Allergen Reactions   Codeine Nausea Only   Prior to Admission medications   Medication Sig Start Date End Date Taking? Authorizing Provider  ACTEMRA ACTPEN 162 MG/0.9ML SOAJ Inject 162 mg into the skin every 14 (fourteen) days. 07/22/21  Yes [provider]  amLODipine (NORVASC) 2.5 MG tablet Take 1 tablet (2.5 mg total) by mouth daily. 10/04/21 07/01/22 Yes WLoel Dubonnet NP  celecoxib (CELEBREX) 200 MG capsule Take 200 mg by mouth 2 (two) times daily. 07/08/21  Yes [provider]  Cholecalciferol (VITAMIN D-3) 125 MCG (5000 UT) TABS Take 5,000 Units by mouth daily.   Yes  [provider]  co-enzyme Q-10 30 MG capsule Take 30 mg by mouth daily.   Yes [provider]  folic acid (FOLVITE) 0.5 MG tablet Take 1 mg by mouth daily.   Yes [provider]  gabapentin (NEURONTIN) 300 MG capsule Take 900 mg by mouth 2 (two) times daily.   Yes [provider]  isosorbide mononitrate (IMDUR) 120 MG 24 hr tablet Take 120 mg by mouth 2 (two) times daily. 05/08/21  Yes [provider]  LORazepam (ATIVAN) 0.5 MG tablet Take 1 tablet (0.5 mg total) by mouth 2 (two) times daily as needed for anxiety. Patient taking differently: Take 0.25 mg by mouth  daily. 07/31/21  Yes Ailene Ards, NP  Methotrexate 25 MG/ML SOSY Inject 25 mg into the skin once a week. Mondays.   Yes [provider]  metoprolol succinate (TOPROL XL) 25 MG 24 hr tablet Take 0.5 tablets (12.5 mg total) by mouth daily. 10/04/21  Yes Loel Dubonnet, NP  Multiple Vitamins-Minerals (MULTIVITAMIN ADULT) CHEW Chew 2 tablets by mouth daily.   Yes [provider]  nitroGLYCERIN (NITRO-DUR) 0.1 mg/hr patch Place 1 patch (0.1 mg total) onto the skin daily. 10/08/21  Yes Loel Dubonnet, NP  nitroGLYCERIN (NITROSTAT) 0.4 MG SL tablet Place 1 tablet (0.4 mg total) under the tongue every 5 (five) minutes as needed for chest pain. 04/24/21  Yes Freada Bergeron, MD  omeprazole (PRILOSEC) 40 MG capsule Take 40 mg by mouth daily.   Yes [provider]  ranolazine (RANEXA) 500 MG 12 hr tablet Take 2 tablets (1,000 mg total) by mouth 2 (two) times daily. 05/03/21  Yes Freada Bergeron, MD  Red Yeast Rice Extract (RED YEAST RICE PO) Take 600 mg by mouth 2 (two) times daily.   Yes [provider]  Turmeric 500 MG CAPS Take 1,000 mg by mouth daily.   Yes [provider]  vitamin B-12 (CYANOCOBALAMIN) 1000 MCG tablet Take 3,000 mcg by mouth daily.   Yes [provider]  buPROPion (WELLBUTRIN XL) 150 MG 24 hr tablet Take 1 tablet (150  mg total) by mouth daily. 09/04/21   Wendie Agreste, MD   Social History   Socioeconomic History   Marital status: Married    Spouse name: Not on file   Number of children: Not on file   Years of education: Not on file   Highest education level: Not on file  Occupational History   Not on file  Tobacco Use   Smoking status: Never    Passive exposure: Never   Smokeless tobacco: Never  Vaping Use   Vaping Use: Never used  Substance and Sexual Activity   Alcohol use: No    Alcohol/week: 0.0 standard drinks   Drug use: No   Sexual activity: Yes    Birth control/protection: None  Other Topics Concern   Not on file  Social History Narrative   Married for 12 years.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   Social Determinants of Health   Financial Resource Strain: Not on file  Food Insecurity: Not on file  Transportation Needs: Not on file  Physical Activity: Not on file  Stress: Not on file  Social Connections: Not on file  Intimate Partner Violence: Not on file    Review of Systems Per HPI.   Objective:   Vitals:   10/16/21 1323  BP: 108/70  Pulse: 71  Resp: 15  Temp: 98 F (36.7 C)  TempSrc: Temporal  SpO2: 99%  Weight: 157 lb 6.4 oz (71.4 kg)  Height: _0  (1.6 m)     Physical Exam Vitals reviewed.  Constitutional:      General: She is not in acute distress.    Appearance: Normal appearance. She is well-developed.  HENT:     Head: Normocephalic and atraumatic.     Right Ear: Hearing, tympanic membrane, ear canal and external ear normal.     Left Ear: Hearing, tympanic membrane and ear canal normal.     Ears:     Comments: Small amount of dried blood at approximately 3:00 just inside the left ear canal, no active bleeding, TM  intact, canal without edema or erythema    Nose: Nose normal.     Comments: Sinuses nontender    Mouth/Throat:     Pharynx: No posterior oropharyngeal erythema.  Eyes:     Conjunctiva/sclera:  Conjunctivae normal.     Pupils: Pupils are equal, round, and reactive to light.  Neck:     Vascular: No carotid bruit.  Cardiovascular:     Rate and Rhythm: Normal rate and regular rhythm.     Heart sounds: Normal heart sounds. No murmur heard. Pulmonary:     Effort: Pulmonary effort is normal. No respiratory distress.     Breath sounds: Normal breath sounds. No wheezing or rhonchi.     Comments: Lungs clear Abdominal:     General: There is no distension.     Palpations: Abdomen is soft. There is no pulsatile mass.     Tenderness: There is abdominal tenderness (Slight tenderness over the left upper quadrant, left rib margin, below the left rib margin.  Abdomen flat, no distention.  No rebound/guarding.).  Musculoskeletal:     Right lower leg: No edema.     Left lower leg: No edema.  Skin:    General: Skin is warm and dry.     Findings: No rash.  Neurological:     Mental Status: She is alert and oriented to person, place, and time.  Psychiatric:        Mood and Affect: Mood normal.        Behavior: Behavior normal.       Assessment & Plan:  EDY MCBANE is a 45 y.o. female . Cough, unspecified type - Plan: benzonatate (TESSALON) 100 MG capsule Chest wall pain - Plan: benzonatate (TESSALON) 100 MG capsule, azithromycin (ZITHROMAX) 250 MG tablet  -Chest, upper abdominal symptoms possible musculoskeletal cause with continued cough.  Discolored phlegm as above, possible bronchitis, sinobronchitis.  -Initially try azithromycin as above, Tessalon Perles to suppress cough, RTC precautions if not improving within 7 to 10 days, sooner if worse or new symptoms.  Malaise and fatigue Anhedonia Depressed mood  -Similar malaise may be due to cough, symptoms above, along with her chronic medical illness.  Some anhedonia, depressed mood discussed previously but did not tolerate Wellbutrin.  Some improvement with start of testosterone and with daily lorazepam.  No new meds for now, plan on  follow-up in 1 month.  Could consider trial of different SSRI or SNRI given her other history.  Ear canal abrasion, left, initial encounter  -No sign of infection.  Strict avoidance of use of cotton tip swabs for now and RTC precautions if persistent blood noted in the next 2 weeks to evaluate for underlying skin issue.    Need for influenza vaccination - Plan: Flu Vaccine QUAD 6+ mos PF IM (Fluarix Quad PF)   Meds ordered this encounter  Medications   benzonatate (TESSALON) 100 MG capsule    Sig: Take 1 capsule (100 mg total) by mouth 3 (three) times daily as needed for cough.    Dispense:  20 capsule    Refill:  0   azithromycin (ZITHROMAX) 250 MG tablet    Sig: Take 2 pills by mouth on day 1, then 1 pill by mouth per day on days 2 through 5.    Dispense:  6 tablet    Refill:  0   Patient Instructions   Avoid use of cotton tip swabs to allow the left ear canal to heal.  In the next 2 weeks if  you continue to notice any bleeding after no use of cotton tip swabs, return and I will recheck that area.  Soreness on left chest, left abdominal area could be related to persistent cough.  Start antibiotic, Tessalon Perles 3 times per day as needed.  If that soreness is not improving in the next week to 10 days at the most, or any worsening symptoms/new symptoms sooner please return for recheck.  No new medications for anxiety/depression at this time.  We can discuss this further at your follow-up visit in 1 month.  Keep me posted.  Hope you feel better soon.   Chest Wall Pain Chest wall pain is pain in or around the bones and muscles of your chest. Sometimes, an injury causes this pain. Excessive coughing or overuse of arm and chest muscles may also cause chest wall pain. Sometimes, the cause may not be known. This pain may take several weeks or longer to get better. Follow these instructions at home: Managing pain, stiffness, and swelling  If directed, put ice on the painful area: Put ice  in a plastic bag. Place a towel between your skin and the bag. Leave the ice on for 20 minutes, 2-3 times per day. Activity Rest as told by your health care provider. Avoid activities that cause pain. These include any activities that use your chest muscles or your abdominal and side muscles to lift heavy items. Ask your health care provider what activities are safe for you. General instructions  Take over-the-counter and prescription medicines only as told by your health care provider. Do not use any products that contain nicotine or tobacco, such as cigarettes, e-cigarettes, and chewing tobacco. These can delay healing after injury. If you need help quitting, ask your health care provider. Keep all follow-up visits as told by your health care provider. This is important. Contact a health care provider if: You have a fever. Your chest pain becomes worse. You have new symptoms. Get help right away if: You have nausea or vomiting. You feel sweaty or light-headed. You have a cough with mucus from your lungs (sputum) or you cough up blood. You develop shortness of breath. These symptoms may represent a serious problem that is an emergency. Do not wait to see if the symptoms will go away. Get medical help right away. Call your local emergency services (911 in the [place] Do not drive yourself to the hospital. Summary Chest wall pain is pain in or around the bones and muscles of your chest. Depending on the cause, it may be treated with ice, rest, medicines, and avoiding activities that cause pain. Contact a health care provider if you have a fever, worsening chest pain, or new symptoms. Get help right away if you feel light-headed or you develop shortness of breath. These symptoms may be an emergency. This information is not intended to replace advice given to you by your health care provider. Make sure you discuss any questions you have with your health care provider. Document Revised:  02/15/2021 Document Reviewed: 02/15/2021 Elsevier Patient Education  2022 [place],   Merri Ray, MD [place] 10/16/21 9:39 PM

## 2021-10-17 ENCOUNTER — Other Ambulatory Visit (INDEPENDENT_AMBULATORY_CARE_PROVIDER_SITE_OTHER): Payer: Self-pay | Admitting: Nurse Practitioner

## 2021-10-17 DIAGNOSIS — F419 Anxiety disorder, unspecified: Secondary | ICD-10-CM

## 2021-10-18 ENCOUNTER — Encounter: Payer: Self-pay | Admitting: Family Medicine

## 2021-10-18 DIAGNOSIS — F419 Anxiety disorder, unspecified: Secondary | ICD-10-CM

## 2021-10-18 MED ORDER — LORAZEPAM 0.5 MG PO TABS: 0.5000 mg | ORAL_TABLET | Freq: Two times a day (BID) | ORAL | 0 refills | Status: DC | PRN

## 2021-10-18 NOTE — Telephone Encounter (Signed)
Controlled substance database (PDMP) reviewed. No concerns appreciated.  Refilled lorazepam - recently discussed.

## 2021-10-21 ENCOUNTER — Encounter (HOSPITAL_BASED_OUTPATIENT_CLINIC_OR_DEPARTMENT_OTHER): Payer: Self-pay

## 2021-10-21 NOTE — Telephone Encounter (Signed)
Attempted to call patient, no answer. Unable to leave message due to full mailbox.

## 2021-10-21 NOTE — Telephone Encounter (Signed)
FYI

## 2021-10-24 ENCOUNTER — Encounter: Payer: Self-pay | Admitting: Family Medicine

## 2021-10-28 DIAGNOSIS — M62830 Muscle spasm of back: Secondary | ICD-10-CM | POA: Diagnosis not present

## 2021-10-28 DIAGNOSIS — M9903 Segmental and somatic dysfunction of lumbar region: Secondary | ICD-10-CM | POA: Diagnosis not present

## 2021-10-28 DIAGNOSIS — M9902 Segmental and somatic dysfunction of thoracic region: Secondary | ICD-10-CM | POA: Diagnosis not present

## 2021-10-28 DIAGNOSIS — M546 Pain in thoracic spine: Secondary | ICD-10-CM | POA: Diagnosis not present

## 2021-10-29 DIAGNOSIS — Q256 Stenosis of pulmonary artery: Secondary | ICD-10-CM | POA: Diagnosis not present

## 2021-10-29 DIAGNOSIS — M314 Aortic arch syndrome [Takayasu]: Secondary | ICD-10-CM | POA: Diagnosis not present

## 2021-10-31 DIAGNOSIS — N951 Menopausal and female climacteric states: Secondary | ICD-10-CM | POA: Diagnosis not present

## 2021-10-31 DIAGNOSIS — R5382 Chronic fatigue, unspecified: Secondary | ICD-10-CM | POA: Diagnosis not present

## 2021-11-01 ENCOUNTER — Encounter: Payer: Self-pay | Admitting: Family Medicine

## 2021-11-05 DIAGNOSIS — Z6826 Body mass index (BMI) 26.0-26.9, adult: Secondary | ICD-10-CM | POA: Diagnosis not present

## 2021-11-05 DIAGNOSIS — R6882 Decreased libido: Secondary | ICD-10-CM | POA: Diagnosis not present

## 2021-11-05 DIAGNOSIS — N951 Menopausal and female climacteric states: Secondary | ICD-10-CM | POA: Diagnosis not present

## 2021-11-05 DIAGNOSIS — M2550 Pain in unspecified joint: Secondary | ICD-10-CM | POA: Diagnosis not present

## 2021-11-06 ENCOUNTER — Ambulatory Visit (INDEPENDENT_AMBULATORY_CARE_PROVIDER_SITE_OTHER): Payer: BC Managed Care – PPO

## 2021-11-06 ENCOUNTER — Ambulatory Visit
Admission: EM | Admit: 2021-11-06 | Discharge: 2021-11-06 | Disposition: A | Discharge status: Home / Self Care | Payer: BC Managed Care – PPO | Attending: Family Medicine | Admitting: Family Medicine

## 2021-11-06 ENCOUNTER — Ambulatory Visit: Payer: BC Managed Care – PPO | Admitting: Family Medicine

## 2021-11-06 ENCOUNTER — Other Ambulatory Visit: Payer: Self-pay

## 2021-11-06 ENCOUNTER — Other Ambulatory Visit: Payer: Self-pay | Admitting: Genetic Counselor

## 2021-11-06 DIAGNOSIS — R61 Generalized hyperhidrosis: Secondary | ICD-10-CM | POA: Insufficient documentation

## 2021-11-06 DIAGNOSIS — R059 Cough, unspecified: Secondary | ICD-10-CM | POA: Insufficient documentation

## 2021-11-06 DIAGNOSIS — Z20822 Contact with and (suspected) exposure to covid-19: Secondary | ICD-10-CM | POA: Diagnosis not present

## 2021-11-06 DIAGNOSIS — J392 Other diseases of pharynx: Secondary | ICD-10-CM | POA: Diagnosis not present

## 2021-11-06 DIAGNOSIS — R509 Fever, unspecified: Secondary | ICD-10-CM

## 2021-11-06 LAB — POCT MONO SCREEN (KUC): Mono, POC: NEGATIVE

## 2021-11-06 LAB — POCT RAPID STREP A (OFFICE): Rapid Strep A Screen: NEGATIVE

## 2021-11-06 NOTE — ED Triage Notes (Signed)
Patient states she has had off and on symptoms for the Flu. She received a Zpak to see if she would get better with some cough medicine. She felt better for 3 days and called her PCP again because she has pressure in her left rib cage. Over the weekend she started have bad night sweats, freezing cold and fever.  Yesterday her throat began to hurt and she noticed white spots in the back of her throat. She coughing up yellow flym with mucus coming out of nose. Now her entire rib cage is now hurting.   She had her hormones checked and it is not her hormones.

## 2021-11-07 LAB — COVID-19, FLU A+B NAA
Influenza A, NAA: NOT DETECTED
Influenza B, NAA: NOT DETECTED
SARS-CoV-2, NAA: NOT DETECTED

## 2021-11-09 LAB — CULTURE, GROUP A STREP (THRC)

## 2021-11-11 ENCOUNTER — Inpatient Hospital Stay: Payer: BC Managed Care – PPO | Attending: Genetic Counselor

## 2021-11-11 ENCOUNTER — Other Ambulatory Visit: Payer: Self-pay

## 2021-11-11 ENCOUNTER — Other Ambulatory Visit: Payer: BC Managed Care – PPO

## 2021-11-11 DIAGNOSIS — Z8371 Family history of colonic polyps: Secondary | ICD-10-CM

## 2021-11-11 LAB — GENETIC SCREENING ORDER

## 2021-11-12 NOTE — ED Provider Notes (Signed)
[place]   [id number] 11/06/21 Arrival Time: 1660  ASSESSMENT & PLAN:  1. Exposure to COVID-19 virus   2. Cough, unspecified type   3. Throat irritation   4. Night sweats    CXR normal. Discussed typical duration of viral illnesses. Viral testing sent. OTC symptom care as needed. Rapid strep negative. Monospot negative. Prefers OTC medications.   Follow-up Information     Wendie Agreste, MD.   Specialties: Family Medicine, Sports Medicine Contact information: [address] [place] [id number] [phone]                 Reviewed expectations re: course of current medical issues. Questions answered. Outlined signs and symptoms indicating need for more acute intervention. Understanding verbalized. After Visit Summary given.   SUBJECTIVE: History from: patient. Kara Anderson is a 45 y.o. female who states she has had off and on symptoms for the Flu. She received a Zpak to see if she would get better with some cough medicine. She felt better for 3 days and called her PCP again because she has pressure in her left rib cage. Over the weekend she started have bad night sweats, freezing cold and fever.  Yesterday her throat began to hurt and she noticed white spots in the back of her throat. She coughing up yellow flym with mucus coming out of nose. Now her entire rib cage is now hurting.    OBJECTIVE:  Vitals:   11/06/21 1308  BP: 103/69  Pulse: 73  Resp: 18  Temp: 98.2 F (36.8 C)  TempSrc: Oral  SpO2: 98%    General appearance: alert; no distress Eyes: PERRLA; EOMI; conjunctiva normal HENT: [place]; AT; with nasal congestion Neck: supple  Lungs: speaks full sentences without difficulty; unlabored; dry cough Extremities: no edema Skin: warm and dry Neurologic: normal gait Psychological: alert and cooperative; normal mood and affect  Labs:   Imaging: DG Chest 2 View  Result Date: 11/06/2021 CLINICAL DATA:  Cough, fever EXAM:  CHEST - 2 VIEW COMPARISON:  08/02/2021 FINDINGS: The heart size and mediastinal contours are within normal limits. Left pulmonary artery stent. Both lungs are clear. The visualized skeletal structures are unremarkable. IMPRESSION: No active cardiopulmonary disease. Electronically Signed   By: Davina Poke D.O.   On: 11/06/2021 13:52    Allergies  Allergen Reactions   Codeine Nausea Only    Past Medical History:  Diagnosis Date   Arthritis    Decreased libido 09/29/2019   Endometriosis 2007,2009   Family history of brain cancer    Family history of breast cancer    Family history of colon cancer    Family history of pancreatic cancer    Heart murmur    HLD (hyperlipidemia) 09/29/2019   Malaise and fatigue 09/29/2019   Mediastinal adenopathy    Mononucleosis    Seasonal allergies    Strep throat    Takayasu's arteritis ([place])    Thyroid disease    Viral meningitis    Vitamin D deficiency disease 09/29/2019   Social History   Socioeconomic History   Marital status: Married    Spouse name: Not on file   Number of children: Not on file   Years of education: Not on file   Highest education level: Not on file  Occupational History   Not on file  Tobacco Use   Smoking status: Never    Passive exposure: Never   Smokeless tobacco: Never  Vaping Use   Vaping  Use: Never used  Substance and Sexual Activity   Alcohol use: No    Alcohol/week: 0.0 standard drinks   Drug use: No   Sexual activity: Yes    Birth control/protection: None  Other Topics Concern   Not on file  Social History Narrative   Married for 12 years.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   Social Determinants of Health   Financial Resource Strain: Not on file  Food Insecurity: Not on file  Transportation Needs: Not on file  Physical Activity: Not on file  Stress: Not on file  Social Connections: Not on file  Intimate Partner Violence: Not on file   Family History   Problem Relation Age of Onset   Heart disease Mother    Stroke Mother    Diabetes Mother    Brain cancer Mother 19       glioblastoma   Heart disease Father    Alcohol abuse Father    Alcoholism Father    Other Sister        FAP   Colonic polyp Sister 69       adenomatous polyp   Breast cancer Maternal Aunt    Lung cancer Paternal Uncle 23   Other Paternal Uncle 12       MVA   Pancreatic disease Maternal Grandmother    Stomach cancer Maternal Grandmother    Colon cancer Maternal Grandmother    Heart attack Paternal Grandfather    Leukemia Cousin 12   Breast cancer Cousin 38       neg GT   Esophageal cancer Neg Hx    Liver disease Neg Hx    Past Surgical History:  Procedure Laterality Date   HIP SURGERY  2013   IR THORACENTESIS ASP PLEURAL SPACE W/IMG GUIDE  11/20/2017   LAPAROSCOPIC ABDOMINAL EXPLORATION  2007,2009   endometriosis   MEDIASTINOTOMY CHAMBERLAIN MCNEIL    Left 11/16/2017   Procedure: LEFT ANTERIOR MEDIASTINOTOMY CHAMBERLAIN PROCEDURE;  Surgeon: [name], MD;  Location: [facility];  Service: Thoracic;  Laterality: Left;   NOSE SURGERY  2007     Vanessa Kick, MD 11/12/21 1032

## 2021-11-14 ENCOUNTER — Ambulatory Visit (INDEPENDENT_AMBULATORY_CARE_PROVIDER_SITE_OTHER): Payer: BC Managed Care – PPO | Admitting: Internal Medicine

## 2021-11-15 ENCOUNTER — Other Ambulatory Visit: Payer: Self-pay

## 2021-11-15 ENCOUNTER — Encounter (HOSPITAL_BASED_OUTPATIENT_CLINIC_OR_DEPARTMENT_OTHER): Payer: Self-pay | Admitting: Family

## 2021-11-15 ENCOUNTER — Ambulatory Visit (INDEPENDENT_AMBULATORY_CARE_PROVIDER_SITE_OTHER): Payer: BC Managed Care – PPO | Admitting: Family

## 2021-11-15 VITALS — BP 118/80 | HR 63 | Ht 63.0 in | Wt 158.6 lb

## 2021-11-15 DIAGNOSIS — F984 Stereotyped movement disorders: Secondary | ICD-10-CM | POA: Diagnosis not present

## 2021-11-15 DIAGNOSIS — Q256 Stenosis of pulmonary artery: Secondary | ICD-10-CM | POA: Diagnosis not present

## 2021-11-15 DIAGNOSIS — M314 Aortic arch syndrome [Takayasu]: Secondary | ICD-10-CM | POA: Diagnosis not present

## 2021-11-15 DIAGNOSIS — R2689 Other abnormalities of gait and mobility: Secondary | ICD-10-CM | POA: Diagnosis not present

## 2021-11-15 DIAGNOSIS — I208 Other forms of angina pectoris: Secondary | ICD-10-CM | POA: Diagnosis not present

## 2021-11-15 MED ORDER — METOPROLOL SUCCINATE ER 25 MG PO TB24: 25.0000 mg | ORAL_TABLET | Freq: Every day | ORAL | 3 refills | Status: DC

## 2021-11-15 MED ORDER — ISOSORBIDE MONONITRATE ER 120 MG PO TB24: 120.0000 mg | ORAL_TABLET | Freq: Two times a day (BID) | ORAL | 3 refills | Status: DC

## 2021-11-15 NOTE — Progress Notes (Signed)
Office Visit    Patient Name: Kara Anderson Date of Encounter: 11/15/2021  PCP:  Wendie Agreste, MD   [place]  Cardiologist:  Freada Bergeron, MD  Advanced Practice Provider:  No care team member to display Electrophysiologist:  None    Chief Complaint    Kara Anderson is a 45 y.o. 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, pulmonary stenosis s/p stenting, hyperlipidemia, microvascular dysfunction and chronic angina  presents today for follow up of angina   Past Medical History    Past Medical History:  Diagnosis Date   Arthritis    Decreased libido 09/29/2019   Endometriosis 2007,2009   Family history of brain cancer    Family history of breast cancer    Family history of colon cancer    Family history of pancreatic cancer    Heart murmur    HLD (hyperlipidemia) 09/29/2019   Malaise and fatigue 09/29/2019   Mediastinal adenopathy    Mononucleosis    Seasonal allergies    Strep throat    Takayasu's arteritis ([place])    Thyroid disease    Viral meningitis    Vitamin D deficiency disease 09/29/2019   Past Surgical History:  Procedure Laterality Date   HIP SURGERY  2013   IR THORACENTESIS ASP PLEURAL SPACE W/IMG GUIDE  11/20/2017   LAPAROSCOPIC ABDOMINAL EXPLORATION  2007,2009   endometriosis   MEDIASTINOTOMY CHAMBERLAIN MCNEIL    Left 11/16/2017   Procedure: LEFT ANTERIOR MEDIASTINOTOMY CHAMBERLAIN PROCEDURE;  Surgeon: Anderson Nakayama, MD;  Location: [facility];  Service: Thoracic;  Laterality: Left;   NOSE SURGERY  2007    Allergies  Allergies  Allergen Reactions   Codeine Nausea Only    History of Present Illness    Kara Anderson is a 45 y.o. 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  Previously followed by [facility] cardiology.  Chest CT 07/2019 with patent pulmonary artery stent and no significant CAD. Seen at [facility] clinic and diagnosed with microvascular dysfunction due to persistent substernal pain.   She was seen by Dr. Johney Frame 07/08/21. She had discontinued Amlodipine due to hypotension and had adjusted dose of Imdur and nitroglycerin patch with improvement in symptoms.   Seen 09/19/21 with persistent angina despite PRN PO nitroglycerin, Imdur 120mg  BID, and 0.4mg  nitroglycerin patch. Amlodipine 2.5mg  QD was initiated. Via MyChart message 09/26/21 Amlodipine was increased to 5mg  QD and nitro patch reduced to 0.2mg .   Seen 10/04/21 with some improvement in symptoms. Still felt there was room for improvement in anginal symptoms and hopeful to further wean off nitroglycerin patch. At that visit and via MyChart message Amlodipine 2.5mg  QD and Toprol 12.5mg  QHS were able to be tolerated and nitroglycerin patch discontinued.   She presents today for follow up. Notes this fall feels worse than last fall. Symptoms progressed over the last week or so. Unsure whether it is related to weather change or increased needs around [place] this time of year. Describes as a "squeezing or grabbing". She has started working with [facility] at [employer] to work to gradually increase her activity and learn to limit as needed. Occasional lightheadedness with quick position changes, reviewed orthostatic precautions in detail. She is eating three meals per day which are heart healthy. Drinks water in addition to two gatorade zero packets for electrolytes .  Encouraged to utilize electrolytes first thing in the morning and continue to limit to one cup of caffeinated coffee.   EKGs/Labs/Other Studies Reviewed:   The following studies were reviewed today:  TTE 01/19/20:  1. Left ventricular ejection fraction, by visual estimation, is 55 to  60%. The left ventricle has normal function. There is no left ventricular  hypertrophy.   2.  The left ventricle has no regional wall motion abnormalities.   3. Global right ventricle has normal systolic function.The right  ventricular size is normal. No increase in right ventricular wall  thickness.   4. Left atrial size was normal.   5. Right atrial size was normal.   6. The mitral valve is normal in structure. Mild mitral valve  regurgitation. No evidence of mitral stenosis.   7. The tricuspid valve is normal in structure.   8. The tricuspid valve is normal in structure. Tricuspid valve  regurgitation is mild.   9. The aortic valve is normal in structure. Aortic valve regurgitation is  not visualized. No evidence of aortic valve sclerosis or stenosis.  10. The pulmonic valve was normal in structure. Pulmonic valve  regurgitation is not visualized.  11. Normal pulmonary artery systolic pressure.  12. The tricuspid regurgitant velocity is 1.66 m/s, and with an assumed  right atrial pressure of 3 mmHg, the estimated right ventricular systolic  pressure is normal at 14.0 mmHg.  13. The inferior vena cava is normal in size with greater than 50%  respiratory variability, suggesting right atrial pressure of 3 mmHg.  14. The average left ventricular global longitudinal strain is 21.2 %.   Carotid ultrasound 10/2017: Final Interpretation:  Right Carotid: There was no evidence of thrombus, dissection,  atherosclerotic                 plaque or stenosis in the cervical carotid system.                 Imaging over the aortic arch shows what appears to be color                 turbulence near the pulmonary artery. Short axis and  subcostal                 views of the heart revealed peak CW gradient of the  pulmonic                 artery in the range of 40-54 mmHg. Pulmonic insufficiency  also                 noted.   Left Carotid: There was no evidence of thrombus, dissection,  atherosclerotic                plaque or stenosis in the cervical carotid system.   Vertebrals:   Both vertebral arteries were patent with antegrade flow.  Subclavians: Normal flow hemodynamics were seen in bilateral subclavian              arteries.               There is also a mobile linear structure in the right  subclavian               vein Discussed   with lead tech who thought structure was a valve but appears longer than usual   EKG:  No EKG today.  Recent Labs: 05/09/2021: TSH 2.09 08/02/2021: ALT 17; BUN 16; Creatinine, Ser 0.82; Hemoglobin 12.9; Platelets  279; Potassium 3.8; Sodium 130  Recent Lipid Panel    Component Value Date/Time   CHOL 261 (H) 05/09/2021 1050   TRIG 137 05/09/2021 1050   HDL 69 05/09/2021 1050   CHOLHDL 3.8 05/09/2021 1050   LDLCALC 165 (H) 05/09/2021 1050   Home Medications   Current Meds  Medication Sig   ACTEMRA ACTPEN 162 MG/0.9ML SOAJ Inject 162 mg into the skin every 14 (fourteen) days.   amLODipine (NORVASC) 2.5 MG tablet Take 1 tablet (2.5 mg total) by mouth daily.   benzonatate (TESSALON) 100 MG capsule Take 1 capsule (100 mg total) by mouth 3 (three) times daily as needed for cough.   celecoxib (CELEBREX) 200 MG capsule Take 200 mg by mouth 2 (two) times daily.   Cholecalciferol (VITAMIN D-3) 125 MCG (5000 UT) TABS Take 5,000 Units by mouth daily.   co-enzyme Q-10 30 MG capsule Take 30 mg by mouth daily.   folic acid (FOLVITE) 0.5 MG tablet Take 1 mg by mouth daily.   gabapentin (NEURONTIN) 300 MG capsule Take 900 mg by mouth 2 (two) times daily.   LORazepam (ATIVAN) 0.5 MG tablet Take 1 tablet (0.5 mg total) by mouth 2 (two) times daily as needed for anxiety.   Methotrexate 25 MG/ML SOSY Inject 25 mg into the skin once a week. Mondays.   Multiple Vitamins-Minerals (MULTIVITAMIN ADULT) CHEW Chew 2 tablets by mouth daily.   nitroGLYCERIN (NITROSTAT) 0.4 MG SL tablet Place 1 tablet (0.4 mg total) under the tongue every 5 (five) minutes as needed for chest pain.   omeprazole (PRILOSEC) 40 MG capsule Take 40 mg by mouth daily.    progesterone (PROMETRIUM) 100 MG capsule Take 1 capsule by mouth at bedtime.   ranolazine (RANEXA) 500 MG 12 hr tablet Take 2 tablets (1,000 mg total) by mouth 2 (two) times daily.   Red Yeast Rice Extract (RED YEAST RICE PO) Take 600 mg by mouth 2 (two) times daily.   Turmeric 500 MG CAPS Take 1,000 mg by mouth daily.   vitamin B-12 (CYANOCOBALAMIN) 1000 MCG tablet Take 3,000 mcg by mouth daily.   [DISCONTINUED] isosorbide mononitrate (IMDUR) 120 MG 24 hr tablet Take 120 mg by mouth 2 (two) times daily.   [DISCONTINUED] metoprolol succinate (TOPROL XL) 25 MG 24 hr tablet Take 0.5 tablets (12.5 mg total) by mouth daily.   [DISCONTINUED] nitroGLYCERIN (NITRO-DUR) 0.1 mg/hr patch Place 1 patch (0.1 mg total) onto the skin daily.     Review of Systems      All other systems reviewed and are otherwise negative except as noted above.  Physical Exam    VS:  BP 118/80 (BP Location: Left Arm, Patient Position: Sitting, Cuff Size: Normal)   Pulse 63   Ht 5\' 3"  (1.6 m)   Wt 158 lb 9.6 oz (71.9 kg)   LMP 11/05/2021 (Exact Date)   SpO2 99%   BMI 28.09 kg/m  , BMI Body mass index is 28.09 kg/m.  Wt Readings from Last 3 Encounters:  11/15/21 158 lb 9.6 oz (71.9 kg)  10/16/21 157 lb 6.4 oz (71.4 kg)  10/04/21 156 lb (70.8 kg)    GEN: Well nourished, well developed, in no acute distress. HEENT: normal. Neck: Supple, no JVD, carotid bruits, or masses. Cardiac: RRR, no murmurs, rubs, or gallops. No clubbing, cyanosis, edema.  Radials/PT 2+ and equal bilaterally.  Respiratory:  Respirations regular and unlabored, clear to auscultation bilaterally. GI: Soft, nontender, nondistended. MS: No deformity or atrophy. Skin: Warm and dry,  no rash. Neuro:  Strength and sensation are intact. Psych: Normal affect.  Assessment & Plan    Microvascular dysfunction with angina - Diagnosed initially at [facility]. Worsening anginal symptoms initially in October with suspicion for nitro tolerance. Nitro  patch gradually weaned, Amlodipine 2.5mg  QD, Toprol 12.5mg  QHS initiated. Relatively limited by hypotension. Reports increased anginal symptoms over last month which she attributes to weather change. Continue Ranexa 1000mg  BID, Imdur 120mg  BID, Amlodipine 2.5mg  QD. Previously intolerant to Amlodipine 5mg  dose. May use Nitro SL PRN. Will trial increase Toprol to 25mg  QHS. If she has symptoms of fatigue or hypotension, she will reduce back to 12.5mg  QD. Working with PT to gradually increase activity as notes she often over does it.   Pulmonary artery stenosis s/p stenting 09/09/18 - Follows with [facility]. 06/2018 echo increased velocities through pulmonary artery peak MPA velocity 2.45 m/s, peak gradient 24mmHg. 06/2018 chest MRI stenosis of R and L main pulmonary arteries likely related to prior Takayasu arteritis. 08/2018 RHC with RA mean 7 mmHg, RV 33/6 mmHg, PA 32/18 mean 17, PWP -70mmHg, index 4.6 PTA of left pulmonary artery. 08/2018 EF >55%, dilated PA. Continue Aspirin, Imdur.   Takayatsu Arteritis - Follows with rheumatology on methotrexate.   Hyperlipidemia - 10/01/21 total cholesterol 302, LDL 185, HDL 94, triglycerides 117. 10 year ASCVD risk score 0.6%.  Will not start statin therapy at this time as her ASCVD risk score is low, concern for mylagia with underlying autoimmune disease. She follows a heart healthy diet and stays active despite her anginal limitations.   Disposition: Follow up in March with Dr. Johney Frame as scheduled  Signed, Loel Dubonnet, NP 11/15/2021, 9:23 PM [place]

## 2021-11-15 NOTE — Patient Instructions (Addendum)
Medication Instructions:  Your physician has recommended you make the following change in your medication:   Increase: Metoprolol from 12.5 (half tablet) to 25mg  (1 whole tablet)  *If you need a refill on your cardiac medications before your next appointment, please call your pharmacy*   Lab Work: None ordered today   Testing/Procedures: None ordered today    Follow-Up: At [facility], you and your health needs are our priority.  As part of our continuing mission to provide you with exceptional heart care, we have created designated Provider Care Teams.  These Care Teams include your primary Cardiologist (physician) and Advanced Practice Providers (APPs -  Physician Assistants and Nurse Practitioners) who all work together to provide you with the care you need, when you need it.  We recommend signing up for the patient portal called "MyChart".  Sign up information is provided on this After Visit Summary.  MyChart is used to connect with patients for Virtual Visits (Telemedicine).  Patients are able to view lab/test results, encounter notes, upcoming appointments, etc.  Non-urgent messages can be sent to your provider as well.   To learn more about what you can do with MyChart, go to [email or web address].    Your next appointment:   Follow up as Scheduled in March with Dr. Johney Frame  The format for your next appointment:   In Person  Provider:   Freada Bergeron, MD     Other Instructions Try drinking something in the electrolytes first thing in the morning.  Wear knee-high compression stockings during the daytime. Ones labeled 15-20 mmHg provide good compression. This help prevent orthostatic hypotension. Try to make position changes slowly to help prevent orthostatic hypotension.   Orthostatic Hypotension Blood pressure is a measurement of how strongly, or weakly, your circulating blood is pressing against the walls of your arteries. Orthostatic hypotension is a drop in  blood pressure that can happen when you change positions, such as when you go from lying down to standing. Arteries are blood vessels that carry blood from your heart throughout your body. When blood pressure is too low, you may not get enough blood to your brain or to the rest of your organs. Orthostatic hypotension can cause light-headedness, sweating, rapid heartbeat, blurred vision, and fainting. These symptoms require further investigation into the cause. What are the causes? Orthostatic hypotension can be caused by many things, including: Sudden changes in posture, such as standing up quickly after you have been sitting or lying down. Loss of blood (anemia) or loss of body fluids (dehydration). Heart problems, neurologic problems, or hormone problems. Pregnancy. Aging. The risk for this condition increases as you get older. Severe infection (sepsis). Certain medicines, such as medicines for high blood pressure or medicines that make the body lose excess fluids (diuretics). What are the signs or symptoms? Symptoms of this condition may include: Weakness, light-headedness, or dizziness. Sweating. Blurred vision. Tiredness (fatigue). Rapid heartbeat. Fainting, in severe cases. How is this diagnosed? This condition is diagnosed based on: Your symptoms and medical history. Your blood pressure measurements. Your health care provider will check your blood pressure when you are: Lying down. Sitting. Standing. A blood pressure reading is recorded as two numbers, such as "120 over 80" (or 120/80). The first ("top") number is called the systolic pressure. It is a measure of the pressure in your arteries as your heart beats. The second ("bottom") number is called the diastolic pressure. It is a measure of the pressure in your arteries when your heart relaxes  between beats. Blood pressure is measured in a unit called mmHg. Healthy blood pressure for most adults is 120/80 mmHg. Orthostatic  hypotension is defined as a 20 mmHg drop in systolic pressure or a 10 mmHg drop in diastolic pressure within 3 minutes of standing. Other information or tests that may be used to diagnose orthostatic hypotension include: Your other vital signs, such as your heart rate and temperature. Blood tests. An electrocardiogram (ECG) or echocardiogram. A Holter monitor. This is a device you wear that records your heart rhythm continuously, usually for 24-48 hours. Tilt table test. For this test, you will be safely secured to a table that moves you from a lying position to an upright position. Your heart rhythm and blood pressure will be monitored during the test. How is this treated? This condition may be treated by: Changing your diet. This may involve eating more salt (sodium) or drinking more water. Changing the dosage of certain medicines you are taking that might be lowering your blood pressure. Correcting the underlying reason for the orthostatic hypotension. Wearing compression stockings. Taking medicines to raise your blood pressure. Avoiding actions that trigger symptoms. Follow these instructions at home: Medicines Take over-the-counter and prescription medicines only as told by your health care provider. Follow instructions from your health care provider about changing the dosage of your current medicines, if this applies. Do not stop or adjust any of your medicines on your own. Eating and drinking  Drink enough fluid to keep your urine pale yellow. Eat extra salt only as directed. Do not add extra salt to your diet unless advised by your health care provider. Eat frequent, small meals. Avoid standing up suddenly after eating. General instructions  Get up slowly from lying down or sitting positions. This gives your blood pressure a chance to adjust. Avoid hot showers and excessive heat as directed by your health care provider. Engage in regular physical activity as directed by your  health care provider. If you have compression stockings, wear them as told. Keep all follow-up visits. This is important. Contact a health care provider if: You have a fever for more than 2-3 days. You feel more thirsty than usual. You feel dizzy or weak. Get help right away if: You have chest pain. You have a fast or irregular heartbeat. You become sweaty or feel light-headed. You feel short of breath. You faint. You have any symptoms of a stroke. "BE FAST" is an easy way to remember the main warning signs of a stroke: B - Balance. Signs are dizziness, sudden trouble walking, or loss of balance. E - Eyes. Signs are trouble seeing or a sudden change in vision. F - Face. Signs are sudden weakness or numbness of the face, or the face or eyelid drooping on one side. A - Arms. Signs are weakness or numbness in an arm. This happens suddenly and usually on one side of the body. S - Speech. Signs are sudden trouble speaking, slurred speech, or trouble understanding what people say. T - Time. Time to call emergency services. Write down what time symptoms started. You have other signs of a stroke, such as: A sudden, severe headache with no known cause. Nausea or vomiting. Seizure. These symptoms may represent a serious problem that is an emergency. Do not wait to see if the symptoms will go away. Get medical help right away. Call your local emergency services (911 in the [place] Do not drive yourself to the hospital. Summary Orthostatic hypotension is a sudden drop in  blood pressure. It can cause light-headedness, sweating, rapid heartbeat, blurred vision, and fainting. Orthostatic hypotension can be diagnosed by having your blood pressure taken while lying down, sitting, and then standing. Treatment may involve changing your diet, wearing compression stockings, sitting up slowly, adjusting your medicines, or correcting the underlying reason for the orthostatic hypotension. Get help right away if  you have chest pain, a fast or irregular heartbeat, or symptoms of a stroke. This information is not intended to replace advice given to you by your health care provider. Make sure you discuss any questions you have with your health care provider. Document Revised: 02/14/2021 Document Reviewed: 02/14/2021 Elsevier Patient Education  [employer].

## 2021-11-17 ENCOUNTER — Encounter: Payer: Self-pay | Admitting: Certified Registered Nurse Anesthetist

## 2021-11-18 ENCOUNTER — Other Ambulatory Visit: Payer: Self-pay

## 2021-11-18 ENCOUNTER — Encounter: Payer: Self-pay | Admitting: Gastroenterology

## 2021-11-18 ENCOUNTER — Telehealth: Payer: Self-pay

## 2021-11-18 ENCOUNTER — Encounter: Payer: BC Managed Care – PPO | Admitting: Gastroenterology

## 2021-11-18 ENCOUNTER — Ambulatory Visit: Payer: BC Managed Care – PPO | Admitting: Gastroenterology

## 2021-11-18 VITALS — BP 105/70 | HR 60 | Temp 97.3°F | Resp 16 | Ht 63.0 in | Wt 157.0 lb

## 2021-11-18 DIAGNOSIS — Z1211 Encounter for screening for malignant neoplasm of colon: Secondary | ICD-10-CM | POA: Diagnosis not present

## 2021-11-18 DIAGNOSIS — R194 Change in bowel habit: Secondary | ICD-10-CM

## 2021-11-18 DIAGNOSIS — R198 Other specified symptoms and signs involving the digestive system and abdomen: Secondary | ICD-10-CM

## 2021-11-18 MED ORDER — SODIUM CHLORIDE 0.9 % IV SOLN: 500.0000 mL | Freq: Once | INTRAVENOUS | Status: DC

## 2021-11-18 NOTE — Progress Notes (Signed)
DT - VS  Pt has pulmonary artery stent x1.

## 2021-11-18 NOTE — Progress Notes (Signed)
Called to room to assist during endoscopic procedure.  Patient ID and intended procedure confirmed with present staff. Received instructions for my participation in the procedure from the performing physician.  

## 2021-11-18 NOTE — Op Note (Signed)
[place] Patient Name: Kara Anderson Procedure Date: 11/18/2021 10:18 AM MRN: [id number] Endoscopist: Thornton Park MD, MD Age: 45 Referring MD:  Date of Birth: 1976/05/20 Gender: Female Account #: [id number] Procedure:                Colonoscopy Indications:              Change in bowel habits with alternating diarrhea                            and constipation                           Rectal fullness that she suspects might be pelvic                            floor dysfunction                           Strong family history of cancer including a sister                            with FAP Medicines:                Monitored Anesthesia Care Procedure:                Pre-Anesthesia Assessment:                           - Prior to the procedure, a History and Physical                            was performed, and patient medications and                            allergies were reviewed. The patient's tolerance of                            previous anesthesia was also reviewed. The risks                            and benefits of the procedure and the sedation                            options and risks were discussed with the patient.                            All questions were answered, and informed consent                            was obtained. Prior Anticoagulants: The patient has                            taken no previous anticoagulant or antiplatelet                            agents. ASA Grade  Assessment: II - A patient with                            mild systemic disease. After reviewing the risks                            and benefits, the patient was deemed in                            satisfactory condition to undergo the procedure.                           After obtaining informed consent, the colonoscope                            was passed under direct vision. Throughout the                            procedure, the patient's blood pressure,  pulse, and                            oxygen saturations were monitored continuously. The                            Colonoscope was introduced through the anus and                            advanced to the the cecum, identified by                            appendiceal orifice and ileocecal valve. The                            colonoscopy was performed with moderate difficulty                            due to a redundant colon and significant looping.                            Successful completion of the procedure was aided by                            changing the patient's position, withdrawing and                            reinserting the scope and applying abdominal                            pressure. The patient tolerated the procedure well.                            The quality of the bowel preparation was good.  Anatomical landmarks were photographed. The                            terminal ileum, ileocecal valve, appendiceal                            orifice, and rectum were photographed. Scope In: 10:24:47 AM Scope Out: 10:40:22 AM Scope Withdrawal Time: 0 hours 9 minutes 10 seconds  Total Procedure Duration: 0 hours 15 minutes 35 seconds  Findings:                 The perianal and digital rectal examinations were                            normal.                           The colon (entire examined portion) appeared                            normal. Biopsies for histology were taken with a                            cold forceps from the right colon and left colon                            for evaluation of microscopic colitis. Estimated                            blood loss was minimal.                           The exam was otherwise without abnormality on                            direct and retroflexion views. No rectal                            abnormalities identified. Complications:            No immediate complications.  Estimated blood loss:                            Minimal. Estimated Blood Loss:     Estimated blood loss was minimal. Impression:               - The entire examined colon is normal. Biopsied.                           - The examination was otherwise normal on direct                            and retroflexion views. Recommendation:           - Patient has a contact number available for  emergencies. The signs and symptoms of potential                            delayed complications were discussed with the                            patient. Return to normal activities tomorrow.                            Written discharge instructions were provided to the                            patient.                           - Resume previous diet.                           - Continue present medications.                           - Await pathology results.                           - Repeat colonoscopy in 5 years for surveillance                            given the family history.                           - Anorectal manometry to further evaluate the                            rectal fullness.                           - Emerging evidence supports eating a diet of                            fruits, vegetables, grains, calcium, and yogurt                            while reducing red meat and alcohol may reduce the                            risk of colon cancer.                           - Thank you for allowing me to be involved in your                            colon cancer prevention. Thornton Park MD, MD 11/18/2021 10:46:07 AM This report has been signed electronically.

## 2021-11-18 NOTE — Telephone Encounter (Signed)
At Dr. Tarri Glenn request, plt has been scheduled for Anorectal Manometry @ [facility] on 02/12/22 @ 1030am, arrival time 10am. Amb referral placed for auth purposes. Prep instructions sent via My Chart. Called pt to make her aware of this information. LVM requesting returned call. Will continue efforts to f/u w/ pt.

## 2021-11-18 NOTE — Telephone Encounter (Signed)
-----   Message from Thornton Park, MD sent at 11/18/2021 10:41 AM EST ----- Please arrange for anorectal manometry to evaluate rectal fullness.  Thanks.  KLB

## 2021-11-18 NOTE — Progress Notes (Signed)
Report given to PACU, vss 

## 2021-11-18 NOTE — Progress Notes (Signed)
Referring Provider: Wendie Agreste, MD Primary Care Physician:  Wendie Agreste, MD  Indication for procedure:  Colon cancer screening   IMPRESSION:  Change in bowel habits with alternating diarrhea and constipation at time of office visit 07/2021 Rectal fullness that she suspects might be pelvic floor dysfunction Strong family history of cancer including a sister with FAP Alternating diarrhea and constipation     PLAN: Colonoscopy   HPI: Kara Anderson is a 45 y.o. female referred by Dr. Anastasio Champion for further evaluation of colon cancer screening.  The history is obtained through the patient and review of her electronic health record.  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  She presents today requesting colonoscopy.  Sister has been diagnosed with FAP. Mother just diagnosed with glioblastoma. Mom ad her aunts have not had a colonoscopy. Maternal grandmother died after a Whipple's procedure for pancreatic cancer that sounds like it involved her stomach and small bowel.  She is concerned that she may even be at higher risk due to chronic immunosuppression and does not want to wait until age 12.   She reports a longstanding history of alternating diarrhea and constipation.  She has abdominal pain that may be related.  Notes the abdominal pain is worse in the summer.  Has recently experienced a fullness at her rectum and wonders if this is related to pelvic floor dysfunction. She has wondered if her GI symptoms may be related to her chronic medications.  Endoscopic history: - No prior colonoscopy or colon cancer screening - EGD for nausea performed by Dr. Arline Asp at the [facility] showed antral erythema. Gastric biopsies 07/29/2019 showed mild reactive gastropathy.  No H. pylori.  No  intestinal metaplasia.  Prior abdominal imaging: Gastric emptying scan with elevated gastric emptying at 2 hours with the stomach entirely empty at 4 hours, small bowel transit developed with need    Past Medical History:  Diagnosis Date   Arthritis    Decreased libido 09/29/2019   Endometriosis 2007,2009   Family history of brain cancer    Family history of breast cancer    Family history of colon cancer    Family history of pancreatic cancer    Heart murmur    HLD (hyperlipidemia) 09/29/2019   Malaise and fatigue 09/29/2019   Mediastinal adenopathy    Mononucleosis    Seasonal allergies    Strep throat    Takayasu's arteritis ([place])    Thyroid disease    Viral meningitis    Vitamin D deficiency disease 09/29/2019    Past Surgical History:  Procedure Laterality Date   HIP SURGERY  2013   IR THORACENTESIS ASP PLEURAL SPACE W/IMG GUIDE  11/20/2017   LAPAROSCOPIC ABDOMINAL EXPLORATION  2007,2009   endometriosis   MEDIASTINOTOMY CHAMBERLAIN MCNEIL    Left 11/16/2017   Procedure: LEFT ANTERIOR MEDIASTINOTOMY CHAMBERLAIN PROCEDURE;  Surgeon: [name], MD;  Location: [facility];  Service: Thoracic;  Laterality: Left;   NOSE SURGERY  2007    Current Outpatient Medications  Medication Sig Dispense Refill   ACTEMRA ACTPEN 162 MG/0.9ML SOAJ Inject 162 mg into the skin every 14 (fourteen) days.     amLODipine (NORVASC) 2.5 MG tablet Take 1 tablet (2.5 mg total) by mouth daily. 90 tablet 2   benzonatate (TESSALON) 100 MG capsule Take 1 capsule (  100 mg total) by mouth 3 (three) times daily as needed for cough. 20 capsule 0   celecoxib (CELEBREX) 200 MG capsule Take 200 mg by mouth 2 (two) times daily.     Cholecalciferol (VITAMIN D-3) 125 MCG (5000 UT) TABS Take 5,000 Units by mouth daily.     co-enzyme Q-10 30 MG capsule Take 30 mg by mouth daily.     folic acid (FOLVITE) 0.5 MG tablet Take 1 mg by mouth daily.     gabapentin (NEURONTIN) 300 MG capsule Take 900 mg by mouth 2  (two) times daily.     isosorbide mononitrate (IMDUR) 120 MG 24 hr tablet Take 1 tablet (120 mg total) by mouth 2 (two) times daily. 180 tablet 3   LORazepam (ATIVAN) 0.5 MG tablet Take 1 tablet (0.5 mg total) by mouth 2 (two) times daily as needed for anxiety. 60 tablet 0   Methotrexate 25 MG/ML SOSY Inject 25 mg into the skin once a week. Mondays.     metoprolol succinate (TOPROL XL) 25 MG 24 hr tablet Take 1 tablet (25 mg total) by mouth daily. 90 tablet 3   Multiple Vitamins-Minerals (MULTIVITAMIN ADULT) CHEW Chew 2 tablets by mouth daily.     nitroGLYCERIN (NITROSTAT) 0.4 MG SL tablet Place 1 tablet (0.4 mg total) under the tongue every 5 (five) minutes as needed for chest pain. 25 tablet 3   omeprazole (PRILOSEC) 40 MG capsule Take 40 mg by mouth daily.     progesterone (PROMETRIUM) 100 MG capsule Take 1 capsule by mouth at bedtime.     ranolazine (RANEXA) 500 MG 12 hr tablet Take 2 tablets (1,000 mg total) by mouth 2 (two) times daily. 180 tablet 3   Red Yeast Rice Extract (RED YEAST RICE PO) Take 600 mg by mouth 2 (two) times daily.     Turmeric 500 MG CAPS Take 1,000 mg by mouth daily.     vitamin B-12 (CYANOCOBALAMIN) 1000 MCG tablet Take 3,000 mcg by mouth daily.     No current facility-administered medications for this visit.    Allergies as of 11/18/2021 - Review Complete 11/15/2021  Allergen Reaction Noted   Codeine Nausea Only 10/29/2015    Family History  Problem Relation Age of Onset   Heart disease Mother    Stroke Mother    Diabetes Mother    Brain cancer Mother 74       glioblastoma   Heart disease Father    Alcohol abuse Father    Alcoholism Father    Other Sister        FAP   Colonic polyp Sister 45       adenomatous polyp   Breast cancer Maternal Aunt    Lung cancer Paternal Uncle 52   Other Paternal Uncle 68       MVA   Pancreatic disease Maternal Grandmother    Stomach cancer Maternal Grandmother    Colon cancer Maternal Grandmother    Heart attack  Paternal Grandfather    Leukemia Cousin 12   Breast cancer Cousin 2       neg GT   Esophageal cancer Neg Hx    Liver disease Neg Hx       Physical Exam: General:   Alert,  well-nourished, pleasant and cooperative in NAD Head:  Normocephalic and atraumatic. Eyes:  Sclera clear, no icterus.   Conjunctiva pink. Ears:  Normal auditory acuity. Nose:  No deformity, discharge,  or lesions. Mouth:  No deformity or lesions.  Neck:  Supple; no masses or thyromegaly. Lungs:  Clear throughout to auscultation.   No wheezes. Heart:  Regular rate and rhythm; no murmurs. Abdomen:  Soft,nontender, nondistended, normal bowel sounds, no rebound or guarding. No hepatosplenomegaly.   Rectal:  Deferred  Msk:  Symmetrical. No boney deformities LAD: No inguinal or umbilical LAD Extremities:  No clubbing or edema. Neurologic:  Alert and  oriented x4;  grossly nonfocal Skin:  Intact without significant lesions or rashes. Psych:  Alert and cooperative. Normal mood and affect.     Amarrion Pastorino L. Tarri Glenn, MD, MPH 11/18/2021, 9:20 AM

## 2021-11-18 NOTE — Patient Instructions (Signed)
Please read handouts provided. Continue present medications. Await pathology results. Repeat colonoscopy in 5 years for screening.   YOU HAD AN ENDOSCOPIC PROCEDURE TODAY AT [facility]:   Refer to the procedure report that was given to you for any specific questions about what was found during the examination.  If the procedure report does not answer your questions, please call your gastroenterologist to clarify.  If you requested that your care partner not be given the details of your procedure findings, then the procedure report has been included in a sealed envelope for you to review at your convenience later.  YOU SHOULD EXPECT: Some feelings of bloating in the abdomen. Passage of more gas than usual.  Walking can help get rid of the air that was put into your GI tract during the procedure and reduce the bloating. If you had a lower endoscopy (such as a colonoscopy or flexible sigmoidoscopy) you may notice spotting of blood in your stool or on the toilet paper. If you underwent a bowel prep for your procedure, you may not have a normal bowel movement for a few days.  Please Note:  You might notice some irritation and congestion in your nose or some drainage.  This is from the oxygen used during your procedure.  There is no need for concern and it should clear up in a day or so.  SYMPTOMS TO REPORT IMMEDIATELY:  Following lower endoscopy (colonoscopy or flexible sigmoidoscopy):  Excessive amounts of blood in the stool  Significant tenderness or worsening of abdominal pains  Swelling of the abdomen that is new, acute  Fever of 100F or higher    For urgent or emergent issues, a gastroenterologist can be reached at any hour by calling [phone]. Do not use MyChart messaging for urgent concerns.    DIET:  We do recommend a small meal at first, but then you may proceed to your regular diet.  Drink plenty of fluids but you should avoid alcoholic beverages for 24  hours.  ACTIVITY:  You should plan to take it easy for the rest of today and you should NOT DRIVE or use heavy machinery until tomorrow (because of the sedation medicines used during the test).    FOLLOW UP: Our staff will call the number listed on your records 48-72 hours following your procedure to check on you and address any questions or concerns that you may have regarding the information given to you following your procedure. If we do not reach you, we will leave a message.  We will attempt to reach you two times.  During this call, we will ask if you have developed any symptoms of COVID 19. If you develop any symptoms (ie: fever, flu-like symptoms, shortness of breath, cough etc.) before then, please call [phone].  If you test positive for Covid 19 in the 2 weeks post procedure, please call and report this information to [place].    If any biopsies were taken you will be contacted by phone or by letter within the next 1-3 weeks.  Please call us at [phone] if you have not heard about the biopsies in 3 weeks.    SIGNATURES/CONFIDENTIALITY: You and/or your care partner have signed paperwork which will be entered into your electronic medical record.  These signatures attest to the fact that that the information above on your After Visit Summary has been reviewed and is understood.  Full responsibility of the confidentiality of this discharge information lies with you and/or your  care-partner.  

## 2021-11-19 ENCOUNTER — Encounter (HOSPITAL_BASED_OUTPATIENT_CLINIC_OR_DEPARTMENT_OTHER): Payer: Self-pay

## 2021-11-19 DIAGNOSIS — R5381 Other malaise: Secondary | ICD-10-CM

## 2021-11-19 DIAGNOSIS — M0600 Rheumatoid arthritis without rheumatoid factor, unspecified site: Secondary | ICD-10-CM | POA: Diagnosis not present

## 2021-11-19 DIAGNOSIS — R2689 Other abnormalities of gait and mobility: Secondary | ICD-10-CM | POA: Diagnosis not present

## 2021-11-19 DIAGNOSIS — R6889 Other general symptoms and signs: Secondary | ICD-10-CM

## 2021-11-19 DIAGNOSIS — F984 Stereotyped movement disorders: Secondary | ICD-10-CM | POA: Diagnosis not present

## 2021-11-19 DIAGNOSIS — I776 Arteritis, unspecified: Secondary | ICD-10-CM | POA: Diagnosis not present

## 2021-11-19 DIAGNOSIS — I208 Other forms of angina pectoris: Secondary | ICD-10-CM

## 2021-11-19 DIAGNOSIS — Z5181 Encounter for therapeutic drug level monitoring: Secondary | ICD-10-CM | POA: Diagnosis not present

## 2021-11-19 NOTE — Telephone Encounter (Signed)
Please advise 

## 2021-11-19 NOTE — Telephone Encounter (Signed)
SECOND ATTEMPT:  Called pt and informed about date/time/location of anorectal Manometry. Pt verbalized she did see her prep instructions and did not have any further questions or concerns at this time.

## 2021-11-20 ENCOUNTER — Telehealth: Payer: Self-pay

## 2021-11-20 NOTE — Telephone Encounter (Signed)
  Follow up Call-  Call back number 11/18/2021  Post procedure Call Back phone  # [phone] cell  Permission to leave phone message Yes  Some recent data might be hidden     Patient questions:  Do you have a fever, pain , or abdominal swelling? No. Pain Score  0 *  Have you tolerated food without any problems? Yes.    Have you been able to return to your normal activities? Yes.    Do you have any questions about your discharge instructions: Diet   No. Medications  No. Follow up visit  No.  Do you have questions or concerns about your Care? No.  Actions: * If pain score is 4 or above: No action needed, pain <4.  Have you developed a fever since your procedure? no  2.   Have you had an respiratory symptoms (SOB or cough) since your procedure? no  3.   Have you tested positive for COVID 19 since your procedure no  4.   Have you had any family members/close contacts diagnosed with the COVID 19 since your procedure?  no   If yes to any of these questions please route to Joylene John, RN and Joella Prince, RN

## 2021-11-21 ENCOUNTER — Encounter: Payer: Self-pay | Admitting: Gastroenterology

## 2021-11-21 ENCOUNTER — Ambulatory Visit: Payer: BC Managed Care – PPO | Admitting: Family Medicine

## 2021-11-27 ENCOUNTER — Encounter: Payer: Self-pay | Admitting: Family Medicine

## 2021-11-27 ENCOUNTER — Ambulatory Visit (INDEPENDENT_AMBULATORY_CARE_PROVIDER_SITE_OTHER): Payer: BC Managed Care – PPO | Admitting: Family Medicine

## 2021-11-27 VITALS — BP 128/62 | HR 71 | Temp 98.2°F | Resp 16 | Ht 63.0 in | Wt 155.4 lb

## 2021-11-27 DIAGNOSIS — Q256 Stenosis of pulmonary artery: Secondary | ICD-10-CM | POA: Diagnosis not present

## 2021-11-27 DIAGNOSIS — R4589 Other symptoms and signs involving emotional state: Secondary | ICD-10-CM | POA: Diagnosis not present

## 2021-11-27 DIAGNOSIS — I208 Other forms of angina pectoris: Secondary | ICD-10-CM | POA: Diagnosis not present

## 2021-11-27 DIAGNOSIS — M314 Aortic arch syndrome [Takayasu]: Secondary | ICD-10-CM | POA: Diagnosis not present

## 2021-11-27 DIAGNOSIS — R0789 Other chest pain: Secondary | ICD-10-CM

## 2021-11-27 DIAGNOSIS — R5381 Other malaise: Secondary | ICD-10-CM

## 2021-11-27 DIAGNOSIS — M6281 Muscle weakness (generalized): Secondary | ICD-10-CM | POA: Diagnosis not present

## 2021-11-27 DIAGNOSIS — R059 Cough, unspecified: Secondary | ICD-10-CM

## 2021-11-27 DIAGNOSIS — R5383 Other fatigue: Secondary | ICD-10-CM

## 2021-11-27 NOTE — Patient Instructions (Addendum)
I would consider trying Cymbalta - discuss with rheumatology. We can start a low dose if needed. Continue gabapentin and physical therapy.  Here is an option for counseling if interested beyond meeting with your pastor.  [facility]: [phone]  I will refer you to pulmonary to discuss cough and shortness of breath.   Return to the clinic or go to the nearest emergency room if any of your symptoms worsen or new symptoms occur.

## 2021-11-27 NOTE — Progress Notes (Signed)
Subjective:  Patient ID: Kara Anderson, female    DOB: Sep 16, 1976  Age: 45 y.o. MRN: [id number]  CC:  Chief Complaint  Patient presents with   Establish Care    Pt has been seen 2 other times for acute visits and is here to officially establish care     HPI Kara Anderson presents for   Follow-up from previous visits, establish care with me since September.  Anxiety: See previous visits.  Has been treated with daily lorazepam.  Has been treated/attempted treatment with previous SSRIs, did not tolerate.  Attempts at Wellbutrin from September visit, felt more depressed, not herself after 2 and half weeks to stop that medication.  She was continued on lorazepam 1/2 pill/day and felt better on testosterone pellet treatment through [facility].  Counseling with her pastor.  We did discuss different SSRI or SNRI given her other health history, polyarthralgias. Most days good, but some days with diffuse body aches. Has history of Takayasu arteritis.  Not being able to keep up with her lifestyle due to pain, low energy, shortness of breath. Unable to clean up tree limbs. Chronic issue. Has d/w cardiology - some med changes have helped some with day to day functioning, but not extra activities.  Not depressed.  Taking gabapentin 900mg  BID, methotrexate and actemra - managed by rheumatology at [facility]. Has been told that chronic fatigue common with autoimmune condition.  Now working with physical therapist at [facility]. Working on core strengthening and identifying abilities.  Depression screen [facility] 2/9 11/27/2021 10/16/2021 09/04/2021 05/09/2021 12/08/2019  Decreased Interest 1 1 1  0 0  Down, Depressed, Hopeless 1 1 0 0 0  PHQ - 2 Score 2 2 1  0 0  Altered sleeping 1 1 0 0 -  Tired, decreased energy 1 1 1  0 -  Change in appetite 1 1 1  0 -  Feeling bad or failure about yourself  1 1 0 0 -  Trouble concentrating 1 1 0 0 -  Moving slowly or fidgety/restless 1 0 0 0 -  Suicidal thoughts 0 0 0 0 -  PHQ-9  Score 8 7 3  0 -  Difficult doing work/chores - - - Not difficult at all -     Cough with upper chest wall pain Discussed November 2.  Treated with azithromycin for possible bronchitis/sinobronchitis, Tessalon Perles to suppress cough.  Thought to have musculoskeletal cause of chest wall pain with continued cough.  Chest x-ray November 23 with no active cardiopulmonary disease. Some chronic cough. Still some productive cough, but less discolored mucus. No fever.  Tessalon perles helped. Night sweats for past few months. No measured.  On omeprazole 40mg  qd, no breakthrough heartburn.  Sweats during day few weeks ago.    History Patient Active Problem List   Diagnosis Date Noted   Family history of breast cancer 08/28/2021   Family history of brain cancer 08/28/2021   Family history of colon cancer 08/28/2021   Family history of pancreatic cancer 08/28/2021   HLD (hyperlipidemia) 09/29/2019   Vitamin D deficiency disease 09/29/2019   Malaise and fatigue 09/29/2019   Decreased libido 09/29/2019   Takayasu's arteritis ([place]) 10/14/2018   Vasculitis ([place]) 12/19/2017   Mediastinal mass 11/04/2017   Pulmonary stenosis 11/04/2017   Shortness of breath 10/31/2017   Bruit 10/31/2017   Murmur 10/31/2017   Breast mass, right 10/29/2015   Past Medical History:  Diagnosis Date   Allergy    Anxiety    Arthritis  Decreased libido 09/29/2019   Depression    Endometriosis 2007,2009   Family history of brain cancer    Family history of breast cancer    Family history of colon cancer    Family history of pancreatic cancer    GERD (gastroesophageal reflux disease)    Heart murmur    HLD (hyperlipidemia) 09/29/2019   Malaise and fatigue 09/29/2019   Mediastinal adenopathy    Mononucleosis    Seasonal allergies    Strep throat    Takayasu's arteritis ([place])    Thyroid disease    hyothyroidism   Viral meningitis    Vitamin D deficiency disease 09/29/2019   Past Surgical History:   Procedure Laterality Date   HIP SURGERY  12/16/2011   IR THORACENTESIS ASP PLEURAL SPACE W/IMG GUIDE  11/20/2017   LAPAROSCOPIC ABDOMINAL EXPLORATION  2007,2009   endometriosis   MEDIASTINOTOMY CHAMBERLAIN MCNEIL    Left 11/16/2017   Procedure: LEFT ANTERIOR MEDIASTINOTOMY CHAMBERLAIN PROCEDURE;  Surgeon: [name], MD;  Location: [facility] OR;  Service: Thoracic;  Laterality: Left;   NOSE SURGERY  12/15/2005   sinus surgery - reconstructe turbinates and deviated septum   Allergies  Allergen Reactions   Codeine Nausea Only   Prior to Admission medications   Medication Sig Start Date End Date Taking? Authorizing Provider  ACTEMRA ACTPEN 162 MG/0.9ML SOAJ Inject 162 mg into the skin every 14 (fourteen) days. 07/22/21  Yes [provider]  amLODipine (NORVASC) 2.5 MG tablet Take 1 tablet (2.5 mg total) by mouth daily. 10/04/21 07/01/22 Yes Loel Dubonnet, NP  celecoxib (CELEBREX) 200 MG capsule Take 200 mg by mouth 2 (two) times daily. 07/08/21  Yes [provider]  Cholecalciferol (VITAMIN D-3) 125 MCG (5000 UT) TABS Take 5,000 Units by mouth daily.   Yes [provider]  co-enzyme Q-10 30 MG capsule Take 30 mg by mouth daily.   Yes [provider]  folic acid (FOLVITE) 0.5 MG tablet Take 1 mg by mouth daily.   Yes [provider]  gabapentin (NEURONTIN) 300 MG capsule Take 900 mg by mouth 2 (two) times daily.   Yes [provider]  isosorbide mononitrate (IMDUR) 120 MG 24 hr tablet Take 1 tablet (120 mg total) by mouth 2 (two) times daily. 11/15/21  Yes Loel Dubonnet, NP  LORazepam (ATIVAN) 0.5 MG tablet Take 1 tablet (0.5 mg total) by mouth 2 (two) times daily as needed for anxiety. 10/18/21  Yes Wendie Agreste, MD  Methotrexate 25 MG/ML SOSY Inject 25 mg into the skin once a week. Mondays.   Yes [provider]  metoprolol succinate (TOPROL XL) 25 MG 24 hr tablet Take 1 tablet (25 mg total) by mouth daily. 11/15/21   Yes Loel Dubonnet, NP  Multiple Vitamins-Minerals (MULTIVITAMIN ADULT) CHEW Chew 2 tablets by mouth daily.   Yes [provider]  nitroGLYCERIN (NITROSTAT) 0.4 MG SL tablet Place 1 tablet (0.4 mg total) under the tongue every 5 (five) minutes as needed for chest pain. 04/24/21  Yes Freada Bergeron, MD  omeprazole (PRILOSEC) 40 MG capsule Take 40 mg by mouth daily.   Yes [provider]  progesterone (PROMETRIUM) 100 MG capsule Take 1 capsule by mouth at bedtime. 11/14/21  Yes [provider]  ranolazine (RANEXA) 500 MG 12 hr tablet Take 2 tablets (1,000 mg total) by mouth 2 (two) times daily. 05/03/21  Yes Freada Bergeron, MD  Red Yeast Rice Extract (RED YEAST RICE PO) Take 600  mg by mouth 2 (two) times daily.   Yes [provider]  Turmeric 500 MG CAPS Take 1,000 mg by mouth daily.   Yes [provider]  vitamin B-12 (CYANOCOBALAMIN) 1000 MCG tablet Take 3,000 mcg by mouth daily.   Yes [provider]   Social History   Socioeconomic History   Marital status: Married    Spouse name: Not on file   Number of children: Not on file   Years of education: Not on file   Highest education level: Not on file  Occupational History   Not on file  Tobacco Use   Smoking status: Never    Passive exposure: Never   Smokeless tobacco: Never  Vaping Use   Vaping Use: Never used  Substance and Sexual Activity   Alcohol use: No    Alcohol/week: 0.0 standard drinks   Drug use: No   Sexual activity: Yes    Birth control/protection: None    Comment: Nov 05 2021  Other Topics Concern   Not on file  Social History Narrative   Married for 12 years.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   Social Determinants of Health   Financial Resource Strain: Not on file  Food Insecurity: Not on file  Transportation Needs: Not on file  Physical Activity: Not on file  Stress: Not on file  Social Connections: Not on file   Intimate Partner Violence: Not on file    Review of Systems  Per HPI.  Objective:   Vitals:   11/27/21 1111  BP: 128/62  Pulse: 71  Resp: 16  Temp: 98.2 F (36.8 C)  TempSrc: Temporal  SpO2: 98%  Weight: 155 lb 6.4 oz (70.5 kg)  Height: 5\' 3"  (1.6 m)     Physical Exam Vitals reviewed.  Constitutional:      Appearance: Normal appearance. She is well-developed.  HENT:     Head: Normocephalic and atraumatic.  Eyes:     Conjunctiva/sclera: Conjunctivae normal.     Pupils: Pupils are equal, round, and reactive to light.  Neck:     Vascular: No carotid bruit.  Cardiovascular:     Rate and Rhythm: Normal rate and regular rhythm.     Heart sounds: Normal heart sounds.  Pulmonary:     Effort: Pulmonary effort is normal. No respiratory distress.     Breath sounds: Normal breath sounds. No stridor. No wheezing, rhonchi or rales.  Abdominal:     Palpations: Abdomen is soft. There is no pulsatile mass.     Tenderness: There is no abdominal tenderness.  Musculoskeletal:     Right lower leg: No edema.     Left lower leg: No edema.  Skin:    General: Skin is warm and dry.  Neurological:     Mental Status: She is alert and oriented to person, place, and time.  Psychiatric:        Mood and Affect: Mood normal.        Behavior: Behavior normal.        Thought Content: Thought content normal.       Assessment & Plan:  Kara Anderson is a 45 y.o. female . Cough, unspecified type - Plan: Ambulatory referral to Pulmonology Chest wall pain Takayasu's arteritis ([place])  -Reassuring chest x-ray after last visit.  Some persistent cough, chronic cough.  Reports episodic night sweats but lungs clear on exam, improved cough after antibiotic.  Will refer to pulmonary for further evaluation, and decide on further  testing/imaging.  Question of chronic bronchitis versus bronchiectasis versus other lower respiratory cause of continued cough.  Less likely upper airway at this time.  RTC  precautions if acute changes.  -Chest wall pain likely due to persistent cough.  Tessalon Perles are okay for now.  Depressed mood Malaise and fatigue  -With various polyarthralgias.  Previous recommendation from rheumatology for possible SNRI versus gabapentin.  Currently on gabapentin.  Would consider low-dose Cymbalta but asked that she clear this with her rheumatologist first.  -Recommended counseling, numbers provided.   No orders of the defined types were placed in this encounter.  Patient Instructions  I would consider trying Cymbalta - discuss with rheumatology. We can start a low dose if needed. Continue gabapentin and physical therapy.  Here is an option for counseling if interested beyond meeting with your pastor.  [facility]: [phone]  I will refer you to pulmonary to discuss cough and shortness of breath.   Return to the clinic or go to the nearest emergency room if any of your symptoms worsen or new symptoms occur.         Signed,   Merri Ray, MD [place], [facility] 11/27/21 6:46 PM

## 2021-11-28 NOTE — Telephone Encounter (Signed)
Called endo scheduling and canceled anorectal manometry per pt request. Routing this message to Dr. Tarri Glenn to make her aware of pt request.

## 2021-12-02 ENCOUNTER — Encounter: Payer: Self-pay | Admitting: Family Medicine

## 2021-12-04 ENCOUNTER — Other Ambulatory Visit: Payer: Self-pay

## 2021-12-04 DIAGNOSIS — M314 Aortic arch syndrome [Takayasu]: Secondary | ICD-10-CM | POA: Diagnosis not present

## 2021-12-04 DIAGNOSIS — I208 Other forms of angina pectoris: Secondary | ICD-10-CM | POA: Diagnosis not present

## 2021-12-04 DIAGNOSIS — Q256 Stenosis of pulmonary artery: Secondary | ICD-10-CM | POA: Diagnosis not present

## 2021-12-04 DIAGNOSIS — M6281 Muscle weakness (generalized): Secondary | ICD-10-CM | POA: Diagnosis not present

## 2021-12-04 MED ORDER — RANOLAZINE ER 500 MG PO TB12: 1000.0000 mg | ORAL_TABLET | Freq: Two times a day (BID) | ORAL | 3 refills | Status: DC

## 2021-12-10 ENCOUNTER — Ambulatory Visit: Payer: Self-pay | Admitting: Genetic Counselor

## 2021-12-10 ENCOUNTER — Telehealth: Payer: Self-pay | Admitting: Genetic Counselor

## 2021-12-10 ENCOUNTER — Encounter: Payer: Self-pay | Admitting: Genetic Counselor

## 2021-12-10 DIAGNOSIS — Z1379 Encounter for other screening for genetic and chromosomal anomalies: Secondary | ICD-10-CM

## 2021-12-10 NOTE — Progress Notes (Signed)
HPI:  [name] was previously seen in the [place] clinic due to a family history of cancer and colon polyps and concerns regarding a hereditary predisposition to cancer. Please refer to our prior cancer genetics clinic note for more information regarding our discussion, assessment and recommendations, at the time. [name] recent genetic test results were disclosed to her, as were recommendations warranted by these results. These results and recommendations are discussed in more detail below.  CANCER HISTORY:  Oncology History   No history exists.    FAMILY HISTORY:  We obtained a detailed, 4-generation family history.  Significant diagnoses are listed below: Family History  Problem Relation Age of Onset   Heart disease Mother    Stroke Mother    Diabetes Mother    Brain cancer Mother 14       glioblastoma   Heart disease Father    Alcohol abuse Father    Alcoholism Father    Other Sister        FAP   Colonic polyp Sister 41       adenomatous polyp   Breast cancer Maternal Aunt    Lung cancer Paternal Uncle 27   Other Paternal Uncle 2       MVA   Pancreatic disease Maternal Grandmother    Stomach cancer Maternal Grandmother    Colon cancer Maternal Grandmother    Heart attack Paternal Grandfather    Leukemia Cousin 12   Breast cancer Cousin 81       neg GT   Esophageal cancer Neg Hx    Liver disease Neg Hx    Rectal cancer Neg Hx      The patient does not have children.  She has a full sister and brother and a paternal half sister and brother who are all cancer free.  Both parents are deceased.   The patient's mother was diagnosed with a glioblastoma and died at 44.  She had two maternal half sisters and half brother and many paternal half siblings.  One maternal half sister had breast cancer.  The patient's maternal grandmother was diagnosed with colon/pancreas/stomach cancer at 15.   The patient's father died from complications of alcohol use.   He had two brothers.  One died at 15 from a car accident and his daughter died at 43 from [place].  The second brother died of lung cancer at 81 and his daughter had breast cancer at 19 and was reportedly negative on her genetic testing.  The paternal grandparents are deceased.  The grandfather reportedly had up to 5 additional children that are not reported to have cancer.   [name] is aware of previous family history of genetic testing for hereditary cancer risks. Patient's maternal ancestors are of English descent, and paternal ancestors are of Caucasian descent. There is no reported Ashkenazi Jewish ancestry. There is no known consanguinity.  GENETIC TEST RESULTS: Genetic testing reported out on December 04, 2021 through the CancerNext-Expanded+RNAinsight cancer panel found no pathogenic mutations. The CancerNext-Expanded gene panel offered by [facility] and includes sequencing and rearrangement analysis for the following 77 genes: AIP, ALK, APC*, ATM*, AXIN2, BAP1, BARD1, BLM, BMPR1A, BRCA1*, BRCA2*, BRIP1*, CDC73, CDH1*, CDK4, CDKN1B, CDKN2A, CHEK2*, CTNNA1, DICER1, FANCC, FH, FLCN, GALNT12, KIF1B, LZTR1, MAX, MEN1, MET, MLH1*, MSH2*, MSH3, MSH6*, MUTYH*, NBN, NF1*, NF2, NTHL1, PALB2*, PHOX2B, PMS2*, POT1, PRKAR1A, PTCH1, PTEN*, RAD51C*, RAD51D*, RB1, RECQL, RET, SDHA, SDHAF2, SDHB, SDHC, SDHD, SMAD4, SMARCA4, SMARCB1, SMARCE1, STK11, SUFU, TMEM127, TP53*, TSC1, TSC2,  VHL and XRCC2 (sequencing and deletion/duplication); EGFR, EGLN1, HOXB13, KIT, MITF, PDGFRA, POLD1, and POLE (sequencing only); EPCAM and GREM1 (deletion/duplication only). DNA and RNA analyses performed for * genes. The test report has been scanned into EPIC and is located under the Molecular Pathology section of the Results Review tab.  A portion of the result report is included below for reference.     We discussed with [name] that because current genetic testing is not perfect, it is possible there may be a gene mutation in  one of these genes that current testing cannot detect, but that chance is small.  We also discussed, that there could be another gene that has not yet been discovered, or that we have not yet tested, that is responsible for the cancer diagnoses in the family. It is also possible there is a hereditary cause for the cancer in the family that [name] did not inherit and therefore was not identified in her testing.  Therefore, it is important to remain in touch with cancer genetics in the future so that we can continue to offer [name] the most up to date genetic testing.   Genetic testing did identify a variant of uncertain significance (VUS) was identified in the Kara Anderson gene called p.Kara Anderson.  At this time, it is unknown if this variant is associated with increased cancer risk or if this is a normal finding, but most variants such as this get reclassified to being inconsequential. It should not be used to make medical management decisions. With time, we suspect the lab will determine the significance of this variant, if any. If we do learn more about it, we will try to contact [name] to discuss it further. However, it is important to stay in touch with [place] periodically and keep the address and phone number up to date.  ADDITIONAL GENETIC TESTING: We discussed with [name] that her genetic testing was fairly extensive.  If there are genes identified to increase cancer risk that can be analyzed in the future, we would be happy to discuss and coordinate this testing at that time.    CANCER SCREENING RECOMMENDATIONS: [name] test result is considered negative (normal).  This means that we have not identified a hereditary cause for her family history of cancer at this time. Most cancers happen by chance and this negative test suggests that her cancer may fall into this category.    While reassuring, this does not definitively rule out a hereditary predisposition to cancer. It is still possible that there  could be genetic mutations that are undetectable by current technology. There could be genetic mutations in genes that have not been tested or identified to increase cancer risk.  Therefore, it is recommended she continue to follow the cancer management and screening guidelines provided by her primary healthcare provider.   An individual's cancer risk and medical management are not determined by genetic test results alone. Overall cancer risk assessment incorporates additional factors, including personal medical history, family history, and any available genetic information that may result in a personalized plan for cancer prevention and surveillance  RECOMMENDATIONS FOR FAMILY MEMBERS:  Individuals in this family might be at some increased risk of developing cancer, over the general population risk, simply due to the family history of cancer.  We recommended women in this family have a yearly mammogram beginning at age 38, or 91 years younger than the earliest onset of cancer, an annual clinical breast exam, and perform monthly breast self-exams.  Women in this family should also have a gynecological exam as recommended by their primary provider. All family members should be referred for colonoscopy starting at age 63.  FOLLOW-UP: Lastly, we discussed with [name] that cancer genetics is a rapidly advancing field and it is possible that new genetic tests will be appropriate for her and/or her family members in the future. We encouraged her to remain in contact with cancer genetics on an annual basis so we can update her personal and family histories and let her know of advances in cancer genetics that may benefit this family.   Our contact number was provided. [name] questions were answered to her satisfaction, and she knows she is welcome to call us at anytime with additional questions or concerns.   Roma Kayser, [place], [facility] Licensed, Certified Genetic Counselor [email or web address]

## 2021-12-10 NOTE — Telephone Encounter (Signed)
Revealed negative genetic testing.  Discussed that we do not know why she has has a family history of cancer. It could be due to a different gene that we are not testing, or maybe our current technology may not be able to pick something up.  It will be important for her to keep in contact with genetics to keep up with whether additional testing may be needed.   One VUS in BLM but this will not affect medical management.

## 2021-12-12 ENCOUNTER — Ambulatory Visit (INDEPENDENT_AMBULATORY_CARE_PROVIDER_SITE_OTHER): Payer: BC Managed Care – PPO | Admitting: Pulmonary Disease

## 2021-12-12 ENCOUNTER — Encounter: Payer: Self-pay | Admitting: Pulmonary Disease

## 2021-12-12 ENCOUNTER — Other Ambulatory Visit: Payer: Self-pay

## 2021-12-12 VITALS — BP 118/70 | HR 72 | Ht 63.0 in | Wt 161.4 lb

## 2021-12-12 DIAGNOSIS — M069 Rheumatoid arthritis, unspecified: Secondary | ICD-10-CM

## 2021-12-12 DIAGNOSIS — R059 Cough, unspecified: Secondary | ICD-10-CM | POA: Diagnosis not present

## 2021-12-12 DIAGNOSIS — R61 Generalized hyperhidrosis: Secondary | ICD-10-CM

## 2021-12-12 NOTE — Patient Instructions (Signed)
We will try to schedule you for CT Chest scan today or tomorrow.   Based on these results we will consider the next steps in your work up or treatment.   If we are unable to get a CT scan this year, we will aggressively treat your sinuses as this could be leading to post-nasal drainage and cough with phlegm production.

## 2021-12-12 NOTE — Progress Notes (Signed)
Synopsis: Referred in December 2022 for Cough by Merri Ray, MD  Subjective:   PATIENT ID: Kara Anderson GENDER: female DOB: 1976/05/05, MRN: [id number]  HPI  Chief Complaint  Patient presents with   Consult    Referred by PCP for chronic cough for the past 6 weeks. Cough has been productive with greenish yellowish phlegm. Denies any wheezing, SOB or chest tightness.    Kara Anderson is a 45 year old woman, never smoker with history of seronegative rheumatoid arthritis, Takayasu's arteritis, pulmonary artery stenosis s/p stent, microvascular angina, GERD and hyperlipidemia who is referred to pulmonary clinic for cough.   She reports having cough over the past 2-3 months that has been productive, initially with green mucous which has turned to yellowish-brown after antibiotic treatment with Zpak. The mucous is currently thick white. She does report sinus/nasal congestion which is worse in the morning when she wakes up. She also notices increase cough with activity such as walking, carrying groceries or carrying feed/water to her chickens. She denies any dyspnea or wheezing. She does use sinus rinses occasionally. She is using nasal gentamicin for healing ulcers. She is not using any other nasal sprays.  She has been started on omeprazole for concern of GERD due to history of dry cough without much improvement in her symptoms.   She also reports night sweats over the past 3 months which are better when taking the Zpak. She also has periods of hot flashes and chills intermittently. She denies fevers. She denies any weight or appetite changes. She reports the skin on her neck and chest have been itchy recently due to the sweats, but no raised skin rashes.  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 They have walk in chicken coups which she will clean.   She is currently taking actemra weekly ad methotrexate weekly for her seronegative rheumatoid arthritis. She has diffuse migrating  joint aches. She has morning stiffness for 15-30 minutes. She has been on daily prednisone which has been weaned off on 11/26/21.  Rheumatology note reviewed from 11/19/21 and Cardiology note 11/15/21 reviewed.  Past Medical History:  Diagnosis Date   Allergy    Anxiety    Arthritis    Decreased libido 09/29/2019   Depression    Endometriosis 2007,2009   Family history of brain cancer    Family history of breast cancer    Family history of colon cancer    Family history of pancreatic cancer    GERD (gastroesophageal reflux disease)    Heart murmur    HLD (hyperlipidemia) 09/29/2019   Malaise and fatigue 09/29/2019   Mediastinal adenopathy    Mononucleosis    Seasonal allergies    Strep throat    Takayasu's arteritis ([place])    Thyroid disease    hyothyroidism   Viral meningitis    Vitamin D deficiency disease 09/29/2019     Family History  Problem Relation Age of Onset   Heart disease Mother    Stroke Mother    Diabetes Mother    Brain cancer Mother 76       glioblastoma   Heart disease Father    Alcohol abuse Father    Alcoholism Father    Other Sister        FAP   Colonic polyp Sister 88       adenomatous polyp   Breast cancer Maternal Aunt    Lung cancer Paternal Uncle 89   Other Paternal Uncle 34  MVA   Pancreatic disease Maternal Grandmother    Stomach cancer Maternal Grandmother    Colon cancer Maternal Grandmother    Heart attack Paternal Grandfather    Leukemia Cousin 12   Breast cancer Cousin 27       neg GT   Esophageal cancer Neg Hx    Liver disease Neg Hx    Rectal cancer Neg Hx      Social History   Socioeconomic History   Marital status: Married    Spouse name: Not on file   Number of children: Not on file   Years of education: Not on file   Highest education level: Not on file  Occupational History   Not on file  Tobacco Use   Smoking status: Never    Passive exposure: Never   Smokeless tobacco: Never  Vaping Use   Vaping  Use: Never used  Substance and Sexual Activity   Alcohol use: No    Alcohol/week: 0.0 standard drinks   Drug use: No   Sexual activity: Yes    Birth control/protection: None    Comment: Nov 05 2021  Other Topics Concern   Not on file  Social History Narrative   Married for 12 years.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   Social Determinants of Health   Financial Resource Strain: Not on file  Food Insecurity: Not on file  Transportation Needs: Not on file  Physical Activity: Not on file  Stress: Not on file  Social Connections: Not on file  Intimate Partner Violence: Not on file     Allergies  Allergen Reactions   Codeine Nausea Only     Outpatient Medications Prior to Visit  Medication Sig Dispense Refill   ACTEMRA ACTPEN 162 MG/0.9ML SOAJ Inject 162 mg into the skin every 14 (fourteen) days.     amLODipine (NORVASC) 2.5 MG tablet Take 1 tablet (2.5 mg total) by mouth daily. 90 tablet 2   celecoxib (CELEBREX) 200 MG capsule Take 200 mg by mouth 2 (two) times daily.     Cholecalciferol (VITAMIN D-3) 125 MCG (5000 UT) TABS Take 5,000 Units by mouth daily.     co-enzyme Q-10 30 MG capsule Take 30 mg by mouth daily.     DULoxetine (CYMBALTA) 30 MG capsule Take 30 mg by mouth daily.     folic acid (FOLVITE) 0.5 MG tablet Take 1 mg by mouth daily.     gabapentin (NEURONTIN) 300 MG capsule Take 900 mg by mouth 2 (two) times daily.     isosorbide mononitrate (IMDUR) 120 MG 24 hr tablet Take 1 tablet (120 mg total) by mouth 2 (two) times daily. 180 tablet 3   LORazepam (ATIVAN) 0.5 MG tablet Take 1 tablet (0.5 mg total) by mouth 2 (two) times daily as needed for anxiety. 60 tablet 0   Methotrexate 25 MG/ML SOSY Inject 25 mg into the skin once a week. Mondays.     metoprolol succinate (TOPROL XL) 25 MG 24 hr tablet Take 1 tablet (25 mg total) by mouth daily. 90 tablet 3   Multiple Vitamins-Minerals (MULTIVITAMIN ADULT) CHEW Chew 2 tablets by mouth daily.      nitroGLYCERIN (NITROSTAT) 0.4 MG SL tablet Place 1 tablet (0.4 mg total) under the tongue every 5 (five) minutes as needed for chest pain. 25 tablet 3   omeprazole (PRILOSEC) 40 MG capsule Take 40 mg by mouth daily.     progesterone (PROMETRIUM) 100 MG capsule Take 1 capsule by mouth at  bedtime.     ranolazine (RANEXA) 500 MG 12 hr tablet Take 2 tablets (1,000 mg total) by mouth 2 (two) times daily. 180 tablet 3   Red Yeast Rice Extract (RED YEAST RICE PO) Take 600 mg by mouth 2 (two) times daily.     Turmeric 500 MG CAPS Take 1,000 mg by mouth daily.     vitamin B-12 (CYANOCOBALAMIN) 1000 MCG tablet Take 3,000 mcg by mouth daily.     No facility-administered medications prior to visit.   Review of Systems  Constitutional:  Negative for chills, fever, malaise/fatigue and weight loss.  HENT:  Negative for congestion, sinus pain and sore throat.   Eyes: Negative.   Respiratory:  Positive for cough, sputum production and shortness of breath. Negative for hemoptysis and wheezing.   Cardiovascular:  Positive for chest pain. Negative for palpitations, orthopnea, claudication and leg swelling.  Gastrointestinal:  Positive for abdominal pain and heartburn. Negative for nausea and vomiting.  Genitourinary: Negative.   Musculoskeletal:  Positive for joint pain. Negative for myalgias.  Skin:  Negative for rash.  Neurological:  Positive for headaches. Negative for weakness.  Endo/Heme/Allergies: Negative.   Psychiatric/Behavioral: Negative.     Objective:   Vitals:   12/12/21 1104  BP: 118/70  Pulse: 72  SpO2: 100%  Weight: 161 lb 6.4 oz (73.2 kg)  Height: 5\' 3"  (1.6 m)     Physical Exam Constitutional:      General: She is not in acute distress.    Appearance: She is not ill-appearing.  HENT:     Head: Normocephalic and atraumatic.     Nose: No congestion or rhinorrhea.     Mouth/Throat:     Mouth: Mucous membranes are moist.     Pharynx: Oropharynx is clear. No oropharyngeal  exudate or posterior oropharyngeal erythema.  Eyes:     General: No scleral icterus.    Conjunctiva/sclera: Conjunctivae normal.     Pupils: Pupils are equal, round, and reactive to light.  Cardiovascular:     Rate and Rhythm: Normal rate and regular rhythm.     Pulses: Normal pulses.     Heart sounds: Normal heart sounds. No murmur heard. Pulmonary:     Effort: Pulmonary effort is normal.     Breath sounds: Normal breath sounds. No wheezing, rhonchi or rales.  Abdominal:     General: Bowel sounds are normal.     Palpations: Abdomen is soft.  Musculoskeletal:     Right lower leg: No edema.     Left lower leg: No edema.  Lymphadenopathy:     Cervical: No cervical adenopathy.  Skin:    General: Skin is warm and dry.  Neurological:     General: No focal deficit present.     Mental Status: She is alert.  Psychiatric:        Mood and Affect: Mood normal.        Behavior: Behavior normal.        Thought Content: Thought content normal.        Judgment: Judgment normal.   CBC    Component Value Date/Time   WBC 4.9 08/02/2021 1839   RBC 3.70 (L) 08/02/2021 1839   HGB 12.9 08/02/2021 1839   HGB 10.1 (L) 11/19/2017 1026   HCT 37.6 08/02/2021 1839   HCT 31.7 (L) 11/19/2017 1026   PLT 279 08/02/2021 1839   PLT 331 11/19/2017 1026   MCV 101.6 (H) 08/02/2021 1839   MCV 86.4 11/19/2017 1026   MCH  34.9 (H) 08/02/2021 1839   MCHC 34.3 08/02/2021 1839   RDW 12.1 08/02/2021 1839   RDW 14.6 (H) 11/19/2017 1026   LYMPHSABS 1.2 08/02/2021 1839   LYMPHSABS 1.2 11/19/2017 1026   MONOABS 0.5 08/02/2021 1839   MONOABS 0.5 11/19/2017 1026   EOSABS 0.1 08/02/2021 1839   EOSABS 0.1 11/19/2017 1026   BASOSABS 0.0 08/02/2021 1839   BASOSABS 0.0 11/19/2017 1026   BMP Latest Ref Rng & Units 08/02/2021 05/09/2021 02/16/2021  Glucose 70 - 99 mg/dL 94 93 83  BUN 6 - 20 mg/dL 16 15 13   Creatinine 0.44 - 1.00 mg/dL 0.82 0.81 0.63  BUN/Creat Ratio 6 - 22 (calc) - NOT APPLICABLE -  Sodium 448 - 145  mmol/L 130(L) 132(L) 136  Potassium 3.5 - 5.1 mmol/L 3.8 4.8 3.9  Chloride 98 - 111 mmol/L 99 97(L) 103  CO2 22 - 32 mmol/L 27 26 23   Calcium 8.9 - 10.3 mg/dL 8.2(L) 9.3 9.4   Chest imaging: CXR 11/06/21 The heart size and mediastinal contours are within normal limits. Left pulmonary artery stent. Both lungs are clear. The visualized skeletal structures are unremarkable.  PFT: No flowsheet data found.  Labs:  Path:  Echo 01/19/20: LV EF 55 to 60%. Normal RV size and systolic function. LA size is normal. RA size is normal. Tricuspid valve is normal. Aortic valve is normal. Pulmonic valve is normal.  Heart Catheterization:  Assessment & Plan:   Cough, unspecified type - Plan: CT Chest High Resolution  Night sweats - Plan: CT Chest High Resolution  Rheumatoid arthritis, involving unspecified site, unspecified whether rheumatoid factor present ([place]) - Plan: CT Chest High Resolution  Discussion: Kara Anderson is a 45 year old woman, never smoker with history of seronegative rheumatoid arthritis, Takayasu's arteritis, pulmonary artery stenosis s/p stent, microvascular angina, GERD and hyperlipidemia who is referred to pulmonary clinic for cough.   The etiology of her chronic cough is unknown at this time. Differential includes sinus disease with post-nasal drainage vs GERD vs hypersensitivity pneumonitis given chicken exposures vs bronchiectasis given history of seronegative rheumatoid arthritis vs indolent atypical infection given her immunosuppressed state.  Her exam is fairly reassuring today but we will move forward with HRCT Chest for further evaluation of her cough and sputum production. Based on any abnormalities noted we will consider hypersensitivity workup, treatment for bronchiectasis, bronchosopy for BAL samples or aggressive sinus treatment.   63 minutes were spent on this visit which included record review, direct patient time and completion of documentation/orders.    Freda Jackson, MD [facility] Office: [phone]     Current Outpatient Medications:    ACTEMRA ACTPEN 162 MG/0.9ML SOAJ, Inject 162 mg into the skin every 14 (fourteen) days., Disp: , Rfl:    amLODipine (NORVASC) 2.5 MG tablet, Take 1 tablet (2.5 mg total) by mouth daily., Disp: 90 tablet, Rfl: 2   celecoxib (CELEBREX) 200 MG capsule, Take 200 mg by mouth 2 (two) times daily., Disp: , Rfl:    Cholecalciferol (VITAMIN D-3) 125 MCG (5000 UT) TABS, Take 5,000 Units by mouth daily., Disp: , Rfl:    co-enzyme Q-10 30 MG capsule, Take 30 mg by mouth daily., Disp: , Rfl:    DULoxetine (CYMBALTA) 30 MG capsule, Take 30 mg by mouth daily., Disp: , Rfl:    folic acid (FOLVITE) 0.5 MG tablet, Take 1 mg by mouth daily., Disp: , Rfl:    gabapentin (NEURONTIN) 300 MG capsule, Take 900 mg by mouth 2 (  two) times daily., Disp: , Rfl:    isosorbide mononitrate (IMDUR) 120 MG 24 hr tablet, Take 1 tablet (120 mg total) by mouth 2 (two) times daily., Disp: 180 tablet, Rfl: 3   LORazepam (ATIVAN) 0.5 MG tablet, Take 1 tablet (0.5 mg total) by mouth 2 (two) times daily as needed for anxiety., Disp: 60 tablet, Rfl: 0   Methotrexate 25 MG/ML SOSY, Inject 25 mg into the skin once a week. Mondays., Disp: , Rfl:    metoprolol succinate (TOPROL XL) 25 MG 24 hr tablet, Take 1 tablet (25 mg total) by mouth daily., Disp: 90 tablet, Rfl: 3   Multiple Vitamins-Minerals (MULTIVITAMIN ADULT) CHEW, Chew 2 tablets by mouth daily., Disp: , Rfl:    nitroGLYCERIN (NITROSTAT) 0.4 MG SL tablet, Place 1 tablet (0.4 mg total) under the tongue every 5 (five) minutes as needed for chest pain., Disp: 25 tablet, Rfl: 3   omeprazole (PRILOSEC) 40 MG capsule, Take 40 mg by mouth daily., Disp: , Rfl:    progesterone (PROMETRIUM) 100 MG capsule, Take 1 capsule by mouth at bedtime., Disp: , Rfl:    ranolazine (RANEXA) 500 MG 12 hr tablet, Take 2 tablets (1,000 mg total) by mouth 2 (two) times daily., Disp: 180  tablet, Rfl: 3   Red Yeast Rice Extract (RED YEAST RICE PO), Take 600 mg by mouth 2 (two) times daily., Disp: , Rfl:    Turmeric 500 MG CAPS, Take 1,000 mg by mouth daily., Disp: , Rfl:    vitamin B-12 (CYANOCOBALAMIN) 1000 MCG tablet, Take 3,000 mcg by mouth daily., Disp: , Rfl:

## 2021-12-13 ENCOUNTER — Ambulatory Visit (INDEPENDENT_AMBULATORY_CARE_PROVIDER_SITE_OTHER)
Admission: RE | Admit: 2021-12-13 | Discharge: 2021-12-13 | Disposition: A | Discharge status: Home / Self Care | Payer: BC Managed Care – PPO | Source: Ambulatory Visit | Attending: Pulmonary Disease | Admitting: Pulmonary Disease

## 2021-12-13 DIAGNOSIS — M069 Rheumatoid arthritis, unspecified: Secondary | ICD-10-CM | POA: Diagnosis not present

## 2021-12-13 DIAGNOSIS — R059 Cough, unspecified: Secondary | ICD-10-CM

## 2021-12-13 DIAGNOSIS — R918 Other nonspecific abnormal finding of lung field: Secondary | ICD-10-CM

## 2021-12-13 DIAGNOSIS — R61 Generalized hyperhidrosis: Secondary | ICD-10-CM | POA: Diagnosis not present

## 2021-12-13 HISTORY — DX: Other nonspecific abnormal finding of lung field: R91.8

## 2021-12-17 ENCOUNTER — Encounter: Payer: Self-pay | Admitting: Pulmonary Disease

## 2021-12-18 NOTE — Telephone Encounter (Signed)
Dr. Erin Fulling, please advise on pt's email requesting HRCT results. Thanks.

## 2021-12-23 ENCOUNTER — Encounter (HOSPITAL_BASED_OUTPATIENT_CLINIC_OR_DEPARTMENT_OTHER): Payer: Self-pay

## 2021-12-23 NOTE — Telephone Encounter (Signed)
Please advise 

## 2021-12-27 ENCOUNTER — Encounter (HOSPITAL_BASED_OUTPATIENT_CLINIC_OR_DEPARTMENT_OTHER): Payer: Self-pay | Admitting: Family

## 2021-12-30 MED ORDER — DILTIAZEM HCL ER COATED BEADS 120 MG PO CP24: 120.0000 mg | ORAL_CAPSULE | Freq: Every day | ORAL | 2 refills | Status: DC

## 2021-12-30 NOTE — Addendum Note (Signed)
Addended by: Loel Dubonnet on: 12/30/2021 09:25 AM   Modules accepted: Orders

## 2022-01-08 ENCOUNTER — Telehealth: Payer: BC Managed Care – PPO | Admitting: Pulmonary Disease

## 2022-01-15 MED ORDER — DILTIAZEM HCL ER COATED BEADS 180 MG PO CP24: 180.0000 mg | ORAL_CAPSULE | Freq: Every day | ORAL | 2 refills | Status: DC

## 2022-01-15 NOTE — Addendum Note (Signed)
Addended by: Loel Dubonnet on: 01/15/2022 04:46 PM   Modules accepted: Orders

## 2022-01-17 ENCOUNTER — Encounter: Payer: Self-pay | Admitting: Family Medicine

## 2022-01-20 ENCOUNTER — Encounter: Payer: Self-pay | Admitting: Family Medicine

## 2022-01-20 ENCOUNTER — Telehealth: Payer: BC Managed Care – PPO | Admitting: Family Medicine

## 2022-01-20 DIAGNOSIS — J0110 Acute frontal sinusitis, unspecified: Secondary | ICD-10-CM | POA: Diagnosis not present

## 2022-01-20 DIAGNOSIS — G9332 Myalgic encephalomyelitis/chronic fatigue syndrome: Secondary | ICD-10-CM | POA: Insufficient documentation

## 2022-01-20 DIAGNOSIS — N951 Menopausal and female climacteric states: Secondary | ICD-10-CM | POA: Insufficient documentation

## 2022-01-20 DIAGNOSIS — R232 Flushing: Secondary | ICD-10-CM | POA: Insufficient documentation

## 2022-01-20 DIAGNOSIS — M2550 Pain in unspecified joint: Secondary | ICD-10-CM | POA: Insufficient documentation

## 2022-01-20 DIAGNOSIS — G479 Sleep disorder, unspecified: Secondary | ICD-10-CM | POA: Insufficient documentation

## 2022-01-20 DIAGNOSIS — Z6828 Body mass index (BMI) 28.0-28.9, adult: Secondary | ICD-10-CM | POA: Insufficient documentation

## 2022-01-20 MED ORDER — DOXYCYCLINE HYCLATE 100 MG PO TABS: 100.0000 mg | ORAL_TABLET | Freq: Two times a day (BID) | ORAL | 0 refills | Status: DC

## 2022-01-20 NOTE — Progress Notes (Signed)
Virtual Visit via Video   I connected with patient on 01/20/22 at  9:30 AM EST by a video enabled telemedicine application and verified that I am speaking with the correct person using two identifiers.  Location patient: Home Location provider: [facility], Office Persons participating in the virtual visit: Patient, Provider, [place] Claiborne Billings C)  I discussed the limitations of evaluation and management by telemedicine and the availability of in person appointments. The patient expressed understanding and agreed to proceed.  Subjective:   HPI:   URI- pt reports she had a viral illness 'a couple of weeks ago'.  Reports she recovered from this 'pretty quickly'.  Then Wednesday developed green nasal drainage and congestion.  Had severe sore throat, nasal congestion.  Mucous was 'bright green for 3 days and then turned into a brownish yellow'.  Pt feels somewhat better today.  Pt reports green mucous typically signifies sinus infxn for her.  + frontal sinus pressure, HA.  No fevers.  Had ear pressure over the weekend.  + sick contacts.  Has not taken a home COVID test.    ROS:   See pertinent positives and negatives per HPI.  Patient Active Problem List   Diagnosis Date Noted   Body mass index (BMI) 28.0-28.9, adult 01/20/2022   Chronic fatigue syndrome 01/20/2022   Difficulty sleeping 01/20/2022   Female climacteric state 01/20/2022   Hot flashes 01/20/2022   Joint pain 01/20/2022   Genetic testing 12/10/2021   Family history of breast cancer 08/28/2021   Family history of brain cancer 08/28/2021   Family history of colon cancer 08/28/2021   Family history of pancreatic cancer 08/28/2021   HLD (hyperlipidemia) 09/29/2019   Vitamin D deficiency disease 09/29/2019   Malaise and fatigue 09/29/2019   Decreased libido 09/29/2019   Other chest pain 08/02/2019   SVT (supraventricular tachycardia) ([place]) 03/01/2019   Hypothyroidism 10/22/2018   Postprocedural hypotension 10/22/2018    Takayasu's arteritis ([place]) 10/14/2018   Pulmonary artery stenosis 09/09/2018   Vasculitis ([place]) 12/19/2017   Mediastinal mass 11/04/2017   Pulmonary stenosis 11/04/2017   Shortness of breath 10/31/2017   Bruit 10/31/2017   Murmur 10/31/2017   Breast mass, right 10/29/2015    Social History   Tobacco Use   Smoking status: Never    Passive exposure: Never   Smokeless tobacco: Never  Substance Use Topics   Alcohol use: No    Alcohol/week: 0.0 standard drinks    Current Outpatient Medications:    ACTEMRA ACTPEN 162 MG/0.9ML SOAJ, Inject 162 mg into the skin every 14 (fourteen) days., Disp: , Rfl:    celecoxib (CELEBREX) 200 MG capsule, Take 200 mg by mouth 2 (two) times daily., Disp: , Rfl:    Cholecalciferol (VITAMIN D-3) 125 MCG (5000 UT) TABS, Take 5,000 Units by mouth daily., Disp: , Rfl:    co-enzyme Q-10 30 MG capsule, Take 30 mg by mouth daily., Disp: , Rfl:    diltiazem (CARDIZEM CD) 180 MG 24 hr capsule, Take 1 capsule (180 mg total) by mouth daily., Disp: 30 capsule, Rfl: 2   folic acid (FOLVITE) 0.5 MG tablet, Take 1 mg by mouth daily., Disp: , Rfl:    gabapentin (NEURONTIN) 300 MG capsule, Take 900 mg by mouth 2 (two) times daily., Disp: , Rfl:    isosorbide mononitrate (IMDUR) 120 MG 24 hr tablet, Take 1 tablet (120 mg total) by mouth 2 (two) times daily., Disp: 180 tablet, Rfl: 3   LORazepam (ATIVAN) 0.5 MG tablet, Take 1  tablet (0.5 mg total) by mouth 2 (two) times daily as needed for anxiety., Disp: 60 tablet, Rfl: 0   Methotrexate 25 MG/ML SOSY, Inject 25 mg into the skin once a week. Mondays., Disp: , Rfl:    metoprolol succinate (TOPROL XL) 25 MG 24 hr tablet, Take 1 tablet (25 mg total) by mouth daily., Disp: 90 tablet, Rfl: 3   Multiple Vitamins-Minerals (MULTIVITAMIN ADULT) CHEW, Chew 2 tablets by mouth daily., Disp: , Rfl:    nitroGLYCERIN (NITROSTAT) 0.4 MG SL tablet, Place 1 tablet (0.4 mg total) under the tongue every 5 (five) minutes as needed for chest  pain., Disp: 25 tablet, Rfl: 3   omeprazole (PRILOSEC) 40 MG capsule, Take 40 mg by mouth daily., Disp: , Rfl:    progesterone (PROMETRIUM) 100 MG capsule, Take 1 capsule by mouth at bedtime., Disp: , Rfl:    ranolazine (RANEXA) 500 MG 12 hr tablet, Take 2 tablets (1,000 mg total) by mouth 2 (two) times daily., Disp: 180 tablet, Rfl: 3   vitamin B-12 (CYANOCOBALAMIN) 1000 MCG tablet, Take 3,000 mcg by mouth daily., Disp: , Rfl:    Red Yeast Rice Extract (RED YEAST RICE PO), Take 600 mg by mouth 2 (two) times daily. (Patient not taking: Reported on 01/20/2022), Disp: , Rfl:    Turmeric 500 MG CAPS, Take 1,000 mg by mouth daily. (Patient not taking: Reported on 01/20/2022), Disp: , Rfl:   Allergies  Allergen Reactions   Codeine Nausea Only    Objective:   There were no vitals taken for this visit. AAOx3, NAD + frontal sinus pressure Pt is able to speak clearly, coherently without shortness of breath or increased work of breathing.  Thought process is linear.  Mood is appropriate.   Assessment and Plan:   Frontal sinusitis- new.  Pt's sxs are consistent w/ sinusitis.  If this is viral, she should continue to improve over the next few days and not require abx.  However, I reviewed 2nd sickening w/ pt and if this occurs, she is to pick up and start the Doxycycline.  Also encouraged her to take home COVID test given known sick contacts.  Since sxs started on Wednesday, she is outside the anti-viral window even if she tests +.  Pt understands and was advised of supportive care.   Annye Asa, MD 01/20/2022

## 2022-01-31 ENCOUNTER — Encounter: Payer: Self-pay | Admitting: Radiology

## 2022-01-31 ENCOUNTER — Other Ambulatory Visit: Payer: Self-pay

## 2022-01-31 ENCOUNTER — Ambulatory Visit: Payer: BC Managed Care – PPO | Admitting: Radiology

## 2022-01-31 VITALS — BP 98/66 | Ht 63.0 in | Wt 163.0 lb

## 2022-01-31 DIAGNOSIS — N760 Acute vaginitis: Secondary | ICD-10-CM

## 2022-01-31 DIAGNOSIS — N809 Endometriosis, unspecified: Secondary | ICD-10-CM

## 2022-01-31 DIAGNOSIS — N926 Irregular menstruation, unspecified: Secondary | ICD-10-CM

## 2022-01-31 LAB — WET PREP FOR TRICH, YEAST, CLUE

## 2022-01-31 LAB — PREGNANCY, URINE: Preg Test, Ur: NEGATIVE

## 2022-01-31 MED ORDER — FLUCONAZOLE 150 MG PO TABS: 150.0000 mg | ORAL_TABLET | Freq: Once | ORAL | 1 refills | Status: AC

## 2022-01-31 NOTE — Progress Notes (Signed)
° ° ° ° ° ° °  Irregular Menstruation 45yo G0 presents with c/o irregular bleeding x 1 year.  Patient's last menstrual period was 01/15/2022. Complains of abnormal vaginal bleeding, long periods, 2 periods per month in this time. She began seeing [facility] for testosterone pellets in September 2022 after her family Dr who had been managing her testosterone passed away. She was started on progesterone 100mg  nightly in December 2022 which lessened the bleeding but there was no change in duration or interval. She was then increased to 200mg  a few weeks ago and still no change. She noticed a pain 1/23 in her RLQ that did mimic previous ovarian cysts she has had. She has a history of unexplained infertility despite surgical treatment of endometriosis. Currently being treated for vasculitis and arthritis with biologics. She has been taking prednisone in varying doses over the past 4 years. No history of abnormal pap smears. She reports taking OCPs and nuva ring in the past and 'it made me feel crazy'  Also- she reports taking antibiotics for URI recently and has vaginal discharge and itching.  Records reviewed from [facility] and [facility]   Objective:  Pelvic exam: VULVA: normal appearing vulva with no masses, tenderness or lesions, VAGINA: normal appearing vagina with normal color and discharge, no lesions, vaginal discharge - white, copious, and creamy, CERVIX: normal appearing cervix without discharge or lesions, UTERUS: uterus is normal size, shape, consistency and nontender, ADNEXA: normal adnexa in size, nontender and no masses.  Testing: UPT:  neg Microscopic wet-mount exam shows hyphae.   Chaperone offered and declined for exam  Assessment/Plan: -Irregular vaginal bleeding Schedule ultrasound to r/o fibroids or polyps causing continues irregular bleeding. Continue progesterone 200mg  nightly -Yeast vaginitis  Fluconazole rx sent

## 2022-02-03 ENCOUNTER — Other Ambulatory Visit: Payer: Self-pay

## 2022-02-03 ENCOUNTER — Other Ambulatory Visit: Payer: Self-pay | Admitting: Family Medicine

## 2022-02-03 DIAGNOSIS — F419 Anxiety disorder, unspecified: Secondary | ICD-10-CM

## 2022-02-03 DIAGNOSIS — N926 Irregular menstruation, unspecified: Secondary | ICD-10-CM

## 2022-02-03 NOTE — Telephone Encounter (Signed)
Patient is requesting a refill of the following medications: Requested Prescriptions   Pending Prescriptions Disp Refills   LORazepam (ATIVAN) 0.5 MG tablet [Pharmacy Med Name: LORAZEPAM 0.5MG  TABLETS] 60 tablet     Sig: TAKE 1 TABLET(0.5 MG) BY MOUTH TWICE DAILY AS NEEDED FOR ANXIETY    Date of patient request: 02/03/2022 Last office visit: 01/20/2022 Date of last refill: 10/18/2021 Last refill amount: 60

## 2022-02-04 NOTE — Telephone Encounter (Signed)
Discussed 11/27/2021. Controlled substance database (PDMP) reviewed. No concerns appreciated.  Last filled 10/18/2021 for #60.  Refill ordered.

## 2022-02-12 ENCOUNTER — Encounter (HOSPITAL_COMMUNITY): Payer: Self-pay

## 2022-02-12 ENCOUNTER — Ambulatory Visit (HOSPITAL_COMMUNITY): Admit: 2022-02-12 | Payer: BC Managed Care – PPO | Admitting: Gastroenterology

## 2022-02-12 DIAGNOSIS — G43909 Migraine, unspecified, not intractable, without status migrainosus: Secondary | ICD-10-CM

## 2022-02-12 HISTORY — DX: Migraine, unspecified, not intractable, without status migrainosus: G43.909

## 2022-02-12 SURGERY — MANOMETRY, ANORECTAL
Anesthesia: Monitor Anesthesia Care

## 2022-02-13 ENCOUNTER — Encounter: Payer: Self-pay | Admitting: Cardiology

## 2022-02-14 NOTE — Telephone Encounter (Signed)
Present GDMT for INOCA includes Diltiazem 180mg  QD, Imdur 120mg  BID, Toprol 25mg  QD, Ranexa 1000mg  BID, PRN nitorglycerin. Previous prior authorization for Ivabradine was denied.  ? ?She is on multiple agents for INOCA and still with anginal symptoms. May require referral to specialty center. Will defer next steps and recommendations to Dr. Johney Frame.  ? ?Loel Dubonnet, NP  ?

## 2022-02-27 ENCOUNTER — Other Ambulatory Visit: Payer: Self-pay

## 2022-02-27 ENCOUNTER — Ambulatory Visit: Payer: BC Managed Care – PPO | Admitting: Obstetrics & Gynecology

## 2022-02-27 ENCOUNTER — Ambulatory Visit (INDEPENDENT_AMBULATORY_CARE_PROVIDER_SITE_OTHER): Payer: BC Managed Care – PPO

## 2022-02-27 ENCOUNTER — Encounter: Payer: Self-pay | Admitting: Obstetrics & Gynecology

## 2022-02-27 VITALS — BP 106/62 | Temp 98.2°F

## 2022-02-27 DIAGNOSIS — N926 Irregular menstruation, unspecified: Secondary | ICD-10-CM | POA: Diagnosis not present

## 2022-02-27 DIAGNOSIS — N809 Endometriosis, unspecified: Secondary | ICD-10-CM

## 2022-02-27 NOTE — Progress Notes (Signed)
? ? ?Kara Anderson [birth year] [id number] ? ? ?     46 y.o.  G0 ? ?RP: Right pelvic pain with irregular bleeding for Pelvic US ? ?HPI: Seen by Rubbie Battiest NP here at [facility] on 01/31/2022:  Patient c/o irregular bleeding x 1 year.  Patient's last menstrual period was 01/15/2022. Complains of abnormal vaginal bleeding, long periods, 2 periods per month in this time. She began seeing [facility] for testosterone pellets in September 2022 after her family Dr who had been managing her testosterone passed away. She was started on progesterone '100mg'$  nightly in December 2022 which lessened the bleeding but there was no change in duration or interval. She was then increased to '200mg'$  a few weeks ago and still no change. She noticed a pain 1/23 in her RLQ that did mimic previous ovarian cysts she has had. She has a history of unexplained infertility despite surgical treatment of endometriosis. Currently being treated for vasculitis and arthritis with biologics. She has been taking prednisone in varying doses over the past 4 years. No history of abnormal pap smears. She reports taking OCPs and nuva ring in the past and 'it made me feel crazy'  Since then, no pelvic pain and LMP wnl on 02/13/2022. ? ? ?OB History  ?Gravida Para Term Preterm AB Living  ?0 0 0 0 0 0  ?SAB IAB Ectopic Multiple Live Births  ?0 0 0 0    ?Obstetric Comments  ?1st Menstrual Cycle:  12  ? ? ?Past medical history,surgical history, problem list, medications, allergies, family history and social history were all reviewed and documented in the EPIC chart. ? ? ?Directed ROS with pertinent positives and negatives documented in the history of present illness/assessment and plan. ? ?Exam: ? ?Vitals:  ? 02/27/22 1436  ?BP: 106/62  ?Temp: 98.2 ?F (36.8 ?C)  ?TempSrc: Oral  ? ?General appearance:  Normal ? ?Pelvic US today: T/V images.  Anteverted uterus within normal in size with multiple small intramural fibroids.  The overall uterine size is measured at 7.05 x 4.24 x  3.13 cm.  4 intramural fibroids were measured all below 2 cm with the largest measured at 1.5 x 1.5 cm on the left lateral side of the uterus.  Tri layered symmetrical endometrial lining measured at 4.99 mm with a possible fundal submucosal fibroid measured at 0.7 x 0.8 cm.  Also possible polyp seen at the endometrium measured at 0.4 x 0.4 cm with a prominent feeder vessel seen leading to the cavity in that area.  Right ovary normal in size with sparse follicles.  The right ovary shows positive perfusion.  Left ovary with an avascular simple cyst measured at 3.4 x 2.6 x 2.5 cm.  Positive perfusion to the left ovary.  Probable right hydrosalpinx measured at 0.75 cm in the transverse axis.  No free fluid in the pelvis. ? ? ?Assessment/Plan:  46 y.o. G0P0000  ? ?1. Irregular bleeding ?C/o irregular bleeding x 1 year.  Patient's last menstrual period was 01/15/2022. Complains of abnormal vaginal bleeding, long periods, 2 periods per month in this time. She began seeing [facility] for testosterone pellets in September 2022 after her family Dr who had been managing her testosterone passed away. She was started on progesterone '100mg'$  nightly in December 2022 which lessened the bleeding but there was no change in duration or interval. She was then increased to '200mg'$  a few weeks ago and still no change. She noticed a pain 1/23 in her RLQ that did  mimic previous ovarian cysts she has had. She has a history of unexplained infertility despite surgical treatment of endometriosis. Currently being treated for vasculitis and arthritis with biologics. She has been taking prednisone in varying doses over the past 4 years. No history of abnormal pap smears. She reports taking OCPs and nuva ring in the past and 'it made me feel crazy' ? ?Pelvic US findings reviewed. Uterus with small Fibroids and normal bilateral ovaries.  Probable Rt Hydrosalpinx.  Possible submucosal fibroid and endometrial polyp.  Will further investigate with a  sonohysterogram at follow-up visit.  Will also reassess the Rt adnexa at that time to confirm stability of the probable Rt Hydrosalpinx. ?- [place] Sonohysterogram; Future ? ?2. H/O Endometriosis determined by laparoscopy ?On Prometrium 100 mg HS.  Will continue. ? ?3. H/O female infertility ?Declines contraception.  Mood changes on Estrogen/Progestin contraception in the past. ? ?Other orders ?- methotrexate (RHEUMATREX) 2.5 MG tablet; SMARTSIG:10 Tablet(s) By Mouth Once a Week ?- HUMIRA PEN 40 MG/0.4ML PNKT; SMARTSIG:40 Milligram(s) SUB-Q Every 2 Weeks ?- aspirin 81 MG EC tablet; Take by mouth. ?- diltiazem (CARDIZEM) 120 MG tablet; Take 120 mg by mouth. In the a.m. ?- Cyanocobalamin (B-12 PO); Take 3,000 mg by mouth daily.  ? ?Princess Bruins MD, 2:47 PM 02/27/2022 ? ? ? ?  ?

## 2022-02-28 ENCOUNTER — Ambulatory Visit: Payer: BC Managed Care – PPO | Admitting: Family Medicine

## 2022-03-03 NOTE — Progress Notes (Deleted)
?Cardiology Office Note:   ? ?Date:  03/03/2022  ? ?ID:  Kara Anderson, DOB April 09, 1976, MRN [id number] ? ?PCP:  Wendie Agreste, MD ?  ?[facility] Providers ?Cardiologist:  Freada Bergeron, MD { ? ?Referring MD: Wendie Agreste, MD  ? ? ? ?History of Present Illness:   ? ?Kara Anderson is a 46 y.o. 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 ? ?Has been followed by [facility] with last visit 02/19/21. Has history of Takayasu's arteritis, PA stenosis (08/2018 L. PA PTA) and mediastinal mass (11/2017 excised c/b elevated L. Hemidiaphragm). She has been seen for episodes of chest pain with work-up including TTE, ECG and enzymes were normal. She had a chest CT August 2020 which showed a patent pulmonary artery stent and no significant CAD. Was seen at [facility] and was diagnosed with microvascular dysfunction due to persistent substernal chest pain. Has been on imdur and amlodipine for microvascular dysfunction. She wants a Arts administrator to help manage her medications. ? ?Seen in clinic on 04/24/21 where she was complaining of significant chest pressure and SOB in the setting of known microvascular dysfunction. Was also having episodes of hypotension with the amlodipine. We made multiple adjustments to her meds to try and improve symptoms.  ? ?Seen 07/08/21. She had discontinued Amlodipine due to hypotension and had adjusted dose of Imdur and nitroglycerin patch with improvement in symptoms.  ?  ?Seen 09/19/21 with persistent angina despite PRN PO nitroglycerin, Imdur '120mg'$  BID, and 0.'4mg'$  nitroglycerin patch. Amlodipine 2.'5mg'$  QD was initiated. Via MyChart message 09/26/21 Amlodipine was increased to '5mg'$  QD and nitro patch reduced to 0.'2mg'$ .  ?  ?Seen 10/04/21 with some improvement in symptoms. Still felt there was room for improvement in anginal symptoms and hopeful  to further wean off nitroglycerin patch. At that visit and via MyChart message Amlodipine 2.'5mg'$  QD and Toprol 12.'5mg'$  QHS were able to be tolerated and nitroglycerin patch discontinued.   ? ?Saw Laurann Montana on 11/15/21 where she continued to have significant symptoms with concern for nitro tolerance. Since that time, we trialed her off amlodipine and on diltiazem.  ? ?Her current regimen includes: dilt '120mg'$  in the AM, dilt '180mg'$  in PM, ranexa '1000mg'$  BID, metop '25mg'$  XL qHS, and nitro prn. ? ?Today, ***  ? ?Past Medical History:  ?Diagnosis Date  ? Allergy   ? Anxiety   ? Arthritis   ? Decreased libido 09/29/2019  ? Depression   ? Endometriosis 2007,2009  ? Family history of brain cancer   ? Family history of breast cancer   ? Family history of colon cancer   ? Family history of pancreatic cancer   ? GERD (gastroesophageal reflux disease)   ? Heart murmur   ? HLD (hyperlipidemia) 09/29/2019  ? Malaise and fatigue 09/29/2019  ? Mediastinal adenopathy   ? Mononucleosis   ? Seasonal allergies   ? Strep throat   ? Takayasu's arteritis ([place])   ? Thyroid disease   ? hyothyroidism  ? Viral meningitis   ? Vitamin D deficiency disease 09/29/2019  ? ? ?Past Surgical History:  ?Procedure Laterality Date  ? HIP SURGERY  12/16/2011  ? IR THORACENTESIS ASP PLEURAL SPACE W/IMG GUIDE  11/20/2017  ? LAPAROSCOPIC ABDOMINAL EXPLORATION  2007,2009  ? endometriosis  ? MEDIASTINOTOMY CHAMBERLAIN MCNEIL    Left 11/16/2017  ? Procedure: LEFT ANTERIOR MEDIASTINOTOMY  CHAMBERLAIN PROCEDURE;  Surgeon: [name], MD;  Location: [place];  Service: Thoracic;  Laterality: Left;  ? NOSE SURGERY  12/15/2005  ? sinus surgery - reconstructe turbinates and deviated septum  ? ? ?Current Medications: ?No outpatient medications have been marked as taking for the 03/06/22 encounter (Appointment) with Freada Bergeron, MD.  ?  ? ?Allergies:   Codeine  ? ?Social History  ? ?Socioeconomic History  ? Marital status: Married  ?  Spouse name: Not  on file  ? Number of children: Not on file  ? Years of education: Not on file  ? Highest education level: Not on file  ?Occupational History  ? Not on file  ?Tobacco Use  ? Smoking status: Never  ?  Passive exposure: Never  ? Smokeless tobacco: Never  ?Vaping Use  ? Vaping Use: Never used  ?Substance and Sexual Activity  ? Alcohol use: No  ?  Alcohol/week: 0.0 standard drinks  ? Drug use: No  ? Sexual activity: Yes  ?  Partners: Male  ?  Birth control/protection: None  ?Other Topics Concern  ? Not on file  ?Social History Narrative  ? 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  ? ?Social Determinants of Health  ? ?Financial Resource Strain: Not on file  ?Food Insecurity: Not on file  ?Transportation Needs: Not on file  ?Physical Activity: Not on file  ?Stress: Not on file  ?Social Connections: Not on file  ?  ? ?Family History: ?The patient's family history includes Alcohol abuse in her father; Alcoholism in her father; Brain cancer (age of onset: 92) in her mother; Breast cancer in her maternal aunt; Breast cancer (age of onset: 13) in her cousin; Colon cancer in her maternal grandmother; Colonic polyp (age of onset: 89) in her sister; Diabetes in her mother; Heart attack in her paternal grandfather; Heart disease in her father and mother; Leukemia (age of onset: 82) in her cousin; Lung cancer (age of onset: 45) in her paternal uncle; Other in her sister; Other (age of onset: 80) in her paternal uncle; Pancreatic disease in her maternal grandmother; Stomach cancer in her maternal grandmother; Stroke in her mother. There is no history of Esophageal cancer, Liver disease, or Rectal cancer. ? ?ROS:   ?Please see the history of present illness.    ?Review of Systems  ?Constitutional:  Positive for malaise/fatigue. Negative for chills and fever.  ?HENT:  Negative for sore throat.   ?Eyes:  Negative for blurred vision.  ?Respiratory:  Positive for shortness of breath.    ?Cardiovascular:  Positive for chest pain. Negative for palpitations, orthopnea, claudication, leg swelling and PND.  ?Gastrointestinal:  Negative for melena, nausea and vomiting.  ?Genitourinary:  Negative for flank pain.  ?Musculoskeletal:  Negative for falls.  ?Neurological:  Negative for dizziness and loss of consciousness.  ?Psychiatric/Behavioral:  Negative for substance abuse.   ? ?EKGs/Labs/Other Studies Reviewed:   ? ?The following studies were reviewed today: ?TTE 01/19/20: ? 1. Left ventricular ejection fraction, by visual estimation, is 55 to  ?60%. The left ventricle has normal function. There is no left ventricular  ?hypertrophy.  ? 2. The left ventricle has no regional wall motion abnormalities.  ? 3. Global right ventricle has normal systolic function.The right  ?ventricular size is normal. No increase in right ventricular wall  ?thickness.  ? 4. Left atrial size was normal.  ? 5. Right atrial size was normal.  ? 6. The mitral valve  is normal in structure. Mild mitral valve  ?regurgitation. No evidence of mitral stenosis.  ? 7. The tricuspid valve is normal in structure.  ? 8. The tricuspid valve is normal in structure. Tricuspid valve  ?regurgitation is mild.  ? 9. The aortic valve is normal in structure. Aortic valve regurgitation is  ?not visualized. No evidence of aortic valve sclerosis or stenosis.  ?10. The pulmonic valve was normal in structure. Pulmonic valve  ?regurgitation is not visualized.  ?11. Normal pulmonary artery systolic pressure.  ?12. The tricuspid regurgitant velocity is 1.66 m/s, and with an assumed  ?right atrial pressure of 3 mmHg, the estimated right ventricular systolic  ?pressure is normal at 14.0 mmHg.  ?13. The inferior vena cava is normal in size with greater than 50%  ?respiratory variability, suggesting right atrial pressure of 3 mmHg.  ?14. The average left ventricular global longitudinal strain is 21.2 %. ? ?Carotid ultrasound 10/2017: ?Final Interpretation:   ?Right Carotid: There was no evidence of thrombus, dissection,  ?atherosclerotic  ?               plaque or stenosis in the cervical carotid system.  ?               Imaging over the aortic arch shows what appears

## 2022-03-05 NOTE — Progress Notes (Signed)
?Cardiology Office Note:   ? ?Date:  03/06/2022  ? ?ID:  Kara Anderson, DOB 1976-10-21, MRN [id number] ? ?PCP:  Wendie Agreste, MD ?  ?[facility] Providers ?Cardiologist:  Freada Bergeron, MD { ? ?Referring MD: Wendie Agreste, MD  ? ? ? ?History of Present Illness:   ? ?Kara Anderson is a 46 y.o. 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 ? ?Has been followed by [facility] with last visit 02/19/21. Has history of Takayasu's arteritis, PA stenosis (08/2018 L. PA PTA) and mediastinal mass (11/2017 excised c/b elevated L. Hemidiaphragm). She has been seen for episodes of chest pain with work-up including TTE, ECG and enzymes were normal. She had a chest CT August 2020 which showed a patent pulmonary artery stent and no significant CAD. Was seen at [facility] and was diagnosed with microvascular dysfunction due to persistent substernal chest pain. Has been on imdur and amlodipine for microvascular dysfunction. She wants a Arts administrator to help manage her medications. ? ?Seen in clinic on 04/24/21 where she was complaining of significant chest pressure and SOB in the setting of known microvascular dysfunction. Was also having episodes of hypotension with the amlodipine. We made multiple adjustments to her meds to try and improve symptoms.  ? ?Seen 07/08/21. She had discontinued Amlodipine due to hypotension and had adjusted dose of Imdur and nitroglycerin patch with improvement in symptoms.  ?  ?Seen 09/19/21 with persistent angina despite PRN PO nitroglycerin, Imdur '120mg'$  BID, and 0.'4mg'$  nitroglycerin patch. Amlodipine 2.'5mg'$  QD was initiated. Via MyChart message 09/26/21 Amlodipine was increased to '5mg'$  QD and nitro patch reduced to 0.'2mg'$ .  ?  ?Seen 10/04/21 with some improvement in symptoms. Still felt there was room for improvement in anginal symptoms and hopeful  to further wean off nitroglycerin patch. At that visit and via MyChart message Amlodipine 2.'5mg'$  QD and Toprol 12.'5mg'$  QHS were able to be tolerated and nitroglycerin patch discontinued.   ? ?Saw Laurann Montana on 11/15/21 where she continued to have significant symptoms with concern for nitro tolerance. Since that time, we trialed her off amlodipine and on diltiazem.  ? ?Her current regimen includes: dilt '120mg'$  in the AM, dilt '180mg'$  in PM, ranexa '1000mg'$  BID, metop '25mg'$  XL qHS, and nitro prn. ? ?Today, she is doing alright. However, her mother, unfortunately, passed away recently. Her inflammatory arthritis flared-up while she was taking care of her mother aggravating her chest pain. The increased dose of dilt helped, however, she noted swelling in her fingers. She is asking if she can go back on the nitro patch now. Would also like to stop metop as it is making her feel more tired.  ? ?She believes her rheumatic medications is are ineffective. She felt poorly on gabapentin and was switched to Humira. Since the switch, she gained 20 lbs. She will start [facility] infusion therapy next month. S ? ?She has difficulty regaining energy after exercise. After a work-out, she feels "wiped" for the rest of the day. She denies chest pressure, syncope, palpitations, PND, orthopnea, or leg swelling. ? ?She reports her diet is good and she is attending physical therapy and psychological therapy.  ? ?Past Medical History:  ?Diagnosis Date  ? Allergy   ? Anxiety   ? Arthritis   ? Decreased libido 09/29/2019  ? Depression   ? Endometriosis 2007,2009  ? Family history  of brain cancer   ? Family history of breast cancer   ? Family history of colon cancer   ? Family history of pancreatic cancer   ? GERD (gastroesophageal reflux disease)   ? Heart murmur   ? HLD (hyperlipidemia) 09/29/2019  ? Malaise and fatigue 09/29/2019  ? Mediastinal adenopathy   ? Mononucleosis   ? Seasonal allergies   ? Strep throat   ? Takayasu's arteritis ([place])   ?  Thyroid disease   ? hyothyroidism  ? Viral meningitis   ? Vitamin D deficiency disease 09/29/2019  ? ? ?Past Surgical History:  ?Procedure Laterality Date  ? HIP SURGERY  12/16/2011  ? IR THORACENTESIS ASP PLEURAL SPACE W/IMG GUIDE  11/20/2017  ? LAPAROSCOPIC ABDOMINAL EXPLORATION  2007,2009  ? endometriosis  ? MEDIASTINOTOMY CHAMBERLAIN MCNEIL    Left 11/16/2017  ? Procedure: LEFT ANTERIOR MEDIASTINOTOMY CHAMBERLAIN PROCEDURE;  Surgeon: [name], MD;  Location: [place];  Service: Thoracic;  Laterality: Left;  ? NOSE SURGERY  12/15/2005  ? sinus surgery - reconstructe turbinates and deviated septum  ? ? ?Current Medications: ?Current Meds  ?Medication Sig  ? aspirin 81 MG EC tablet Take by mouth.  ? celecoxib (CELEBREX) 200 MG capsule Take 200 mg by mouth 2 (two) times daily.  ? Cholecalciferol (VITAMIN D-3) 125 MCG (5000 UT) TABS Take 5,000 Units by mouth daily.  ? co-enzyme Q-10 30 MG capsule Take 30 mg by mouth daily.  ? Cyanocobalamin (B-12 PO) Take 3,000 mg by mouth daily.  ? diltiazem (CARDIZEM CD) 120 MG 24 hr capsule Take 1 capsule (120 mg total) by mouth daily.  ? folic acid (FOLVITE) 0.5 MG tablet Take 1 mg by mouth daily.  ? gabapentin (NEURONTIN) 300 MG capsule '600mg'$  by mouth in the AM, '600mg'$  by mouth midday, '1200mg'$  by mouth at bedtime  ? isosorbide mononitrate (IMDUR) 120 MG 24 hr tablet Take 1 tablet (120 mg total) by mouth 2 (two) times daily.  ? LORazepam (ATIVAN) 0.5 MG tablet TAKE 1 TABLET(0.5 MG) BY MOUTH TWICE DAILY AS NEEDED FOR ANXIETY  ? methotrexate (RHEUMATREX) 2.5 MG tablet SMARTSIG:10 Tablet(s) By Mouth Once a Week  ? Multiple Vitamins-Minerals (MULTIVITAMIN ADULT) CHEW Chew 2 tablets by mouth daily.  ? nitroGLYCERIN (NITRODUR - DOSED IN MG/24 HR) 0.3 mg/hr patch Place 1 patch (0.3 mg total) onto the skin daily.  ? nitroGLYCERIN (NITROSTAT) 0.4 MG SL tablet Place 1 tablet (0.4 mg total) under the tongue every 5 (five) minutes as needed for chest pain.  ? omeprazole (PRILOSEC) 40  MG capsule Take 40 mg by mouth daily.  ? progesterone (PROMETRIUM) 100 MG capsule Take 1 capsule by mouth at bedtime.  ? ranolazine (RANEXA) 500 MG 12 hr tablet Take 2 tablets (1,000 mg total) by mouth 2 (two) times daily.  ? Turmeric 500 MG CAPS Take 1,000 mg by mouth daily.  ? [DISCONTINUED] diltiazem (CARDIZEM CD) 180 MG 24 hr capsule Take 1 capsule (180 mg total) by mouth daily.  ? [DISCONTINUED] diltiazem (CARDIZEM) 120 MG tablet Take 120 mg by mouth. In the a.m.  ? [DISCONTINUED] metoprolol succinate (TOPROL XL) 25 MG 24 hr tablet Take 1 tablet (25 mg total) by mouth daily.  ?  ? ?Allergies:   Codeine  ? ?Social History  ? ?Socioeconomic History  ? Marital status: Married  ?  Spouse name: Not on file  ? Number of children: Not on file  ? Years of education: Not on file  ? Highest education level: Not  on file  ?Occupational History  ? Not on file  ?Tobacco Use  ? Smoking status: Never  ?  Passive exposure: Never  ? Smokeless tobacco: Never  ?Vaping Use  ? Vaping Use: Never used  ?Substance and Sexual Activity  ? Alcohol use: No  ?  Alcohol/week: 0.0 standard drinks  ? Drug use: No  ? Sexual activity: Yes  ?  Partners: Male  ?  Birth control/protection: None  ?Other Topics Concern  ? Not on file  ?Social History Narrative  ? 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  ? ?Social Determinants of Health  ? ?Financial Resource Strain: Not on file  ?Food Insecurity: Not on file  ?Transportation Needs: Not on file  ?Physical Activity: Not on file  ?Stress: Not on file  ?Social Connections: Not on file  ?  ? ?Family History: ?The patient's family history includes Alcohol abuse in her father; Alcoholism in her father; Brain cancer (age of onset: 72) in her mother; Breast cancer in her maternal aunt; Breast cancer (age of onset: 89) in her cousin; Colon cancer in her maternal grandmother; Colonic polyp (age of onset: 23) in her sister; Diabetes in her mother; Heart attack in  her paternal grandfather; Heart disease in her father and mother; Leukemia (age of onset: 49) in her cousin; Lung cancer (age of onset: 40) in her paternal uncle; Other in her sister; Other (age of on

## 2022-03-06 ENCOUNTER — Encounter: Payer: Self-pay | Admitting: Cardiology

## 2022-03-06 ENCOUNTER — Other Ambulatory Visit: Payer: Self-pay

## 2022-03-06 ENCOUNTER — Ambulatory Visit: Payer: BC Managed Care – PPO | Admitting: Cardiology

## 2022-03-06 VITALS — BP 108/70 | HR 64 | Ht 63.0 in | Wt 166.6 lb

## 2022-03-06 DIAGNOSIS — Q256 Stenosis of pulmonary artery: Secondary | ICD-10-CM | POA: Diagnosis not present

## 2022-03-06 DIAGNOSIS — M314 Aortic arch syndrome [Takayasu]: Secondary | ICD-10-CM

## 2022-03-06 DIAGNOSIS — I208 Other forms of angina pectoris: Secondary | ICD-10-CM | POA: Diagnosis not present

## 2022-03-06 MED ORDER — NITROGLYCERIN 0.3 MG/HR TD PT24: 0.3000 mg | MEDICATED_PATCH | Freq: Every day | TRANSDERMAL | 12 refills | Status: DC

## 2022-03-06 MED ORDER — DILTIAZEM HCL ER COATED BEADS 120 MG PO CP24: 120.0000 mg | ORAL_CAPSULE | Freq: Every day | ORAL | 3 refills | Status: DC

## 2022-03-06 NOTE — Patient Instructions (Signed)
Medication Instructions:  ? ?STOP TAKING METOPROLOL SUCCINATE NOW ? ?STOP TAKING REGULAR DILTIAZEM 120 MG TABLET NOW ? ?DECREASE YOUR DILTIAZEM (CARDIZEM CD) TO 120 MG BY MOUTH DAILY ? ?START USING USING NITRO PATCH- 0.3 MG PLACE 1 PATCH ONTO THE SKIN DAILY ? ?*If you need a refill on your cardiac medications before your next appointment, please call your pharmacy* ? ? ? ?Follow-Up: ? ?[place] NP AT OUR [facility] LOCATION ? ? ? ?

## 2022-03-07 ENCOUNTER — Encounter: Payer: Self-pay | Admitting: Obstetrics & Gynecology

## 2022-03-07 ENCOUNTER — Encounter: Payer: Self-pay | Admitting: Cardiology

## 2022-03-07 NOTE — Telephone Encounter (Signed)
New PCP updated in this pts chart as requested.  ?Dr. Johney Frame to advise on disability questions from the pt. ?

## 2022-03-10 IMAGING — CT CT CHEST HIGH RESOLUTION
2 of 7 series · 14 of 36 positions shown, 17 images · non-contrast
Comparison: Chest CT 12/30/2017.

CLINICAL DATA: 45-year-old female with persistent productive cough
and night sweats.

EXAM:
CT CHEST WITHOUT CONTRAST
TECHNIQUE: Multidetector CT imaging of the chest was performed following the
standard protocol without intravenous contrast. High resolution
imaging of the lungs, as well as inspiratory and expiratory imaging,
was performed.

[Series 4: high resolution · axial · 0.64mm/px · z∈[-254,-4]mm · 11 of 302 slices shown, 14 images]
[im 26/302  mediastinal]
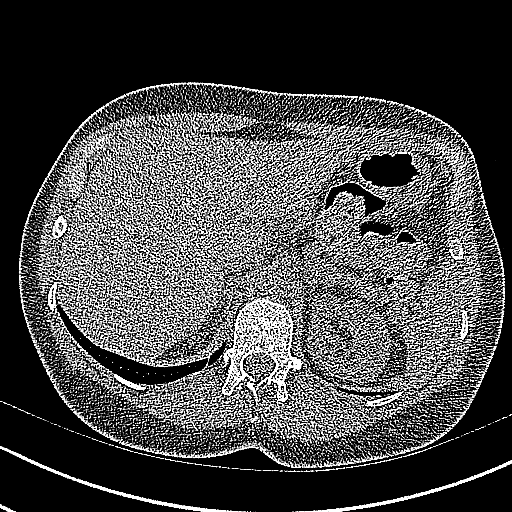
[im 26/302  lung]
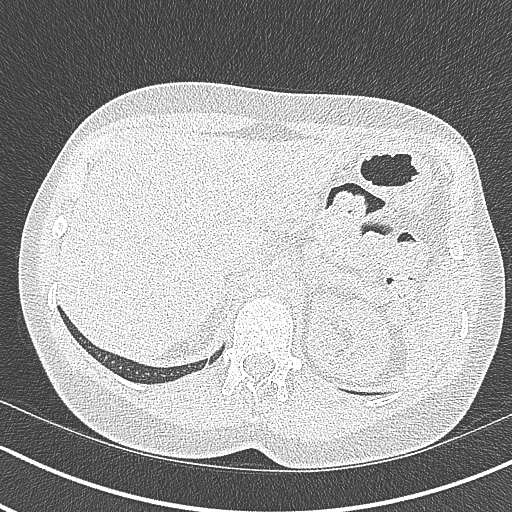
[im 51/302  lung]
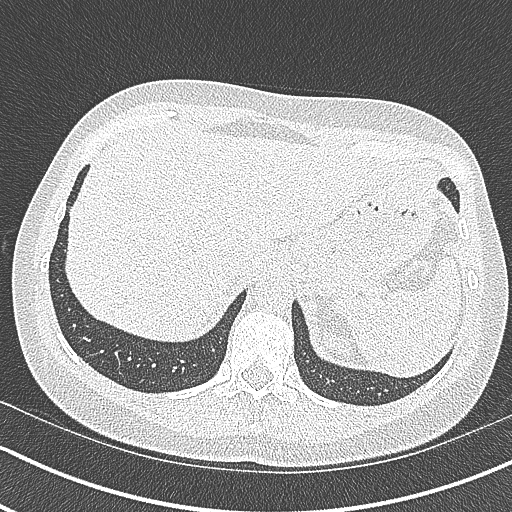
[im 76/302  lung]
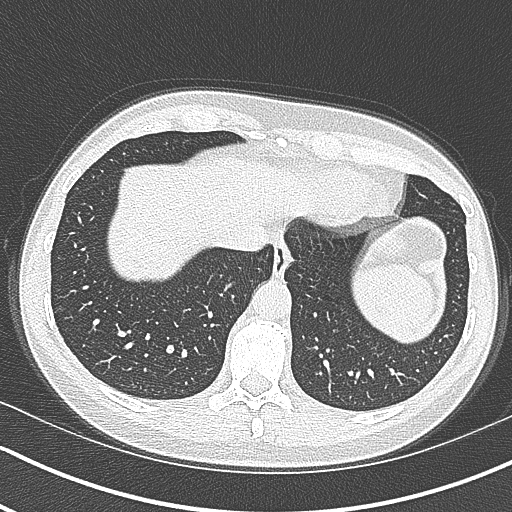
[im 101/302  lung]
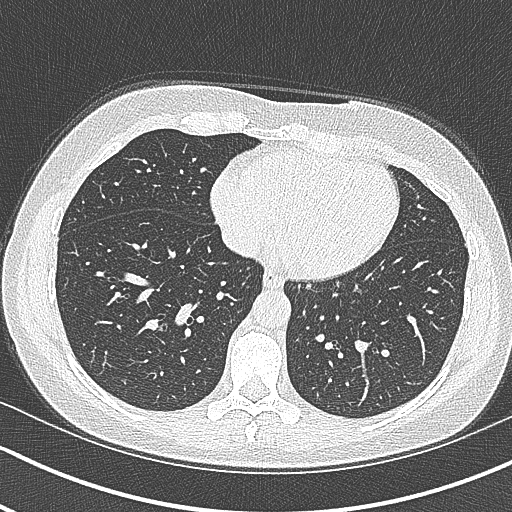
[im 126/302  mediastinal]
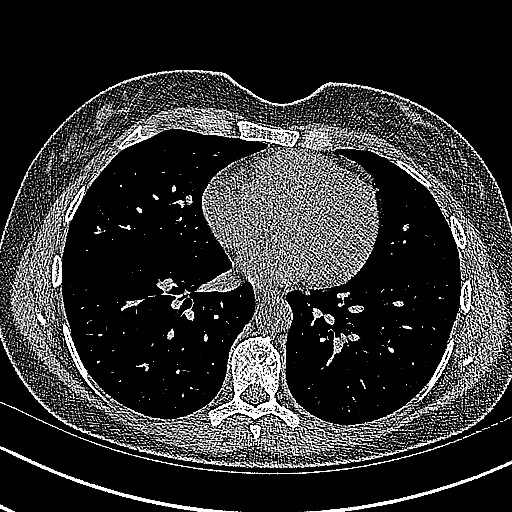
[im 126/302  lung]
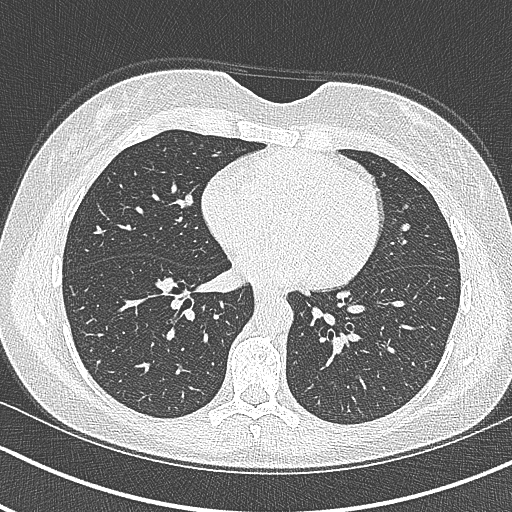
[im 151/302  lung]
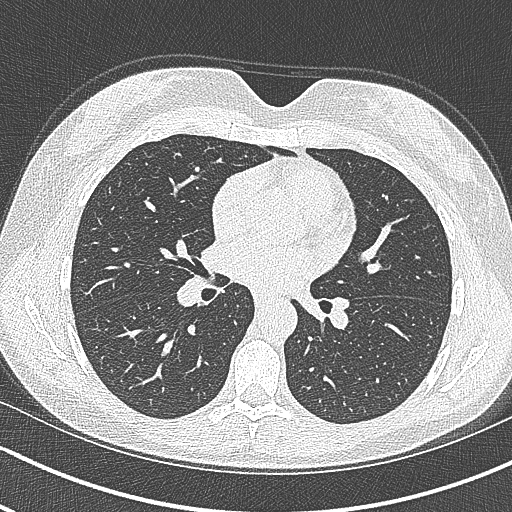
[im 176/302  lung]
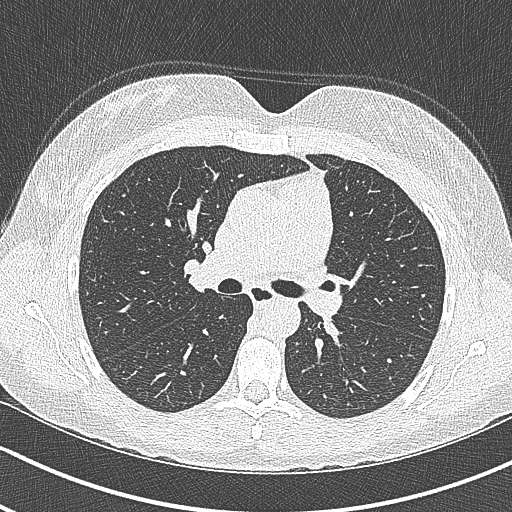
[im 201/302  lung]
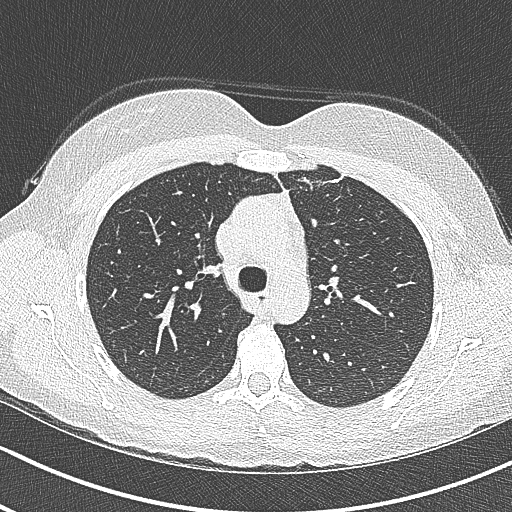
[im 226/302  mediastinal]
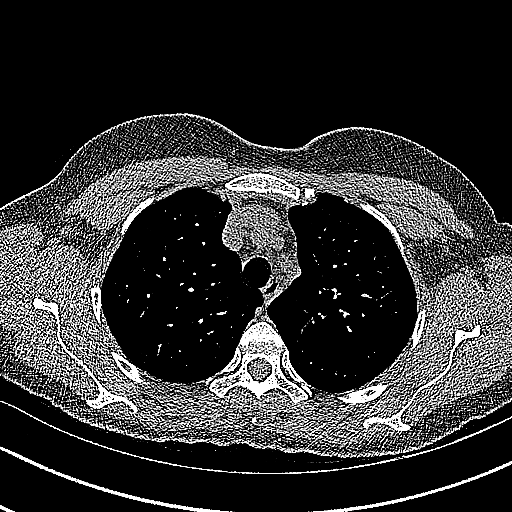
[im 226/302  lung]
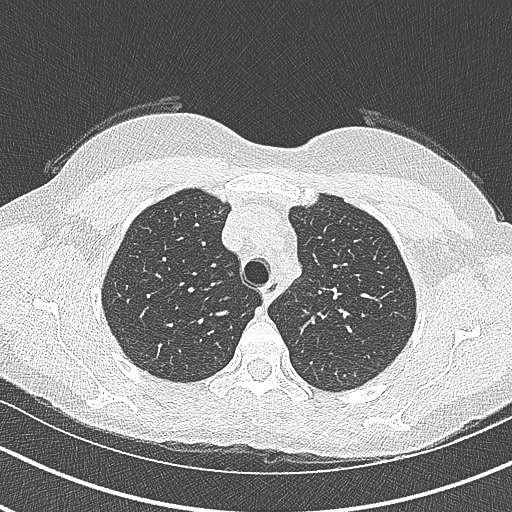
[im 251/302  lung]
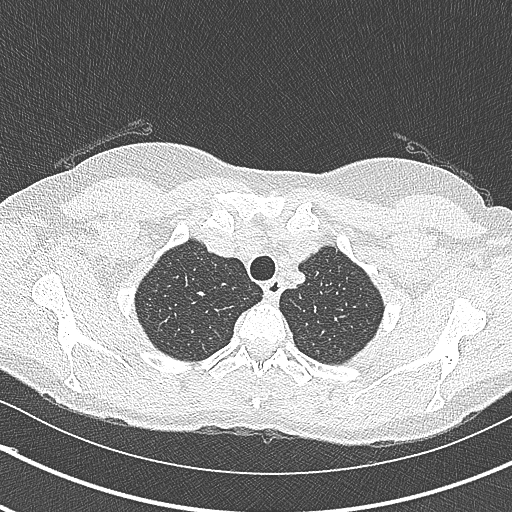
[im 276/302  lung]
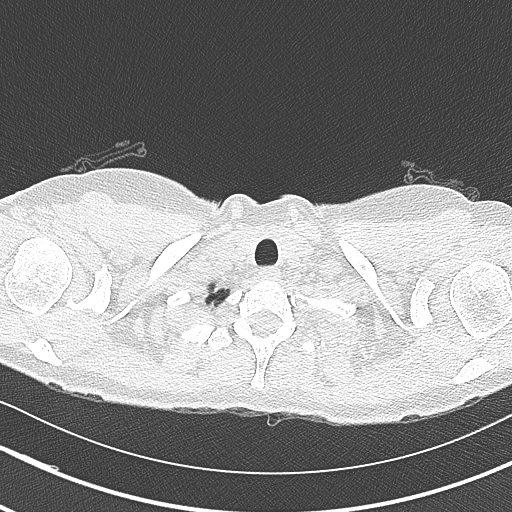

[Series 6: coronal · coronal · 0.59mm/px · 3 of 114 slices shown]
[im 23/114  lung]
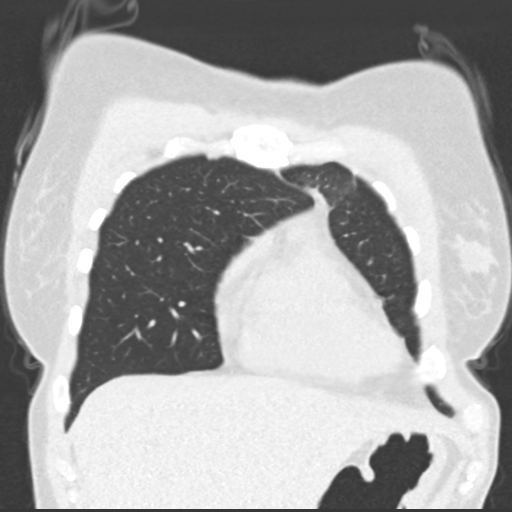
[im 46/114  lung]
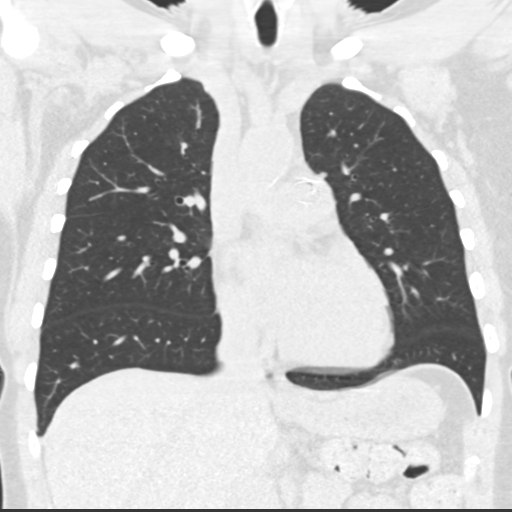
[im 68/114  lung]
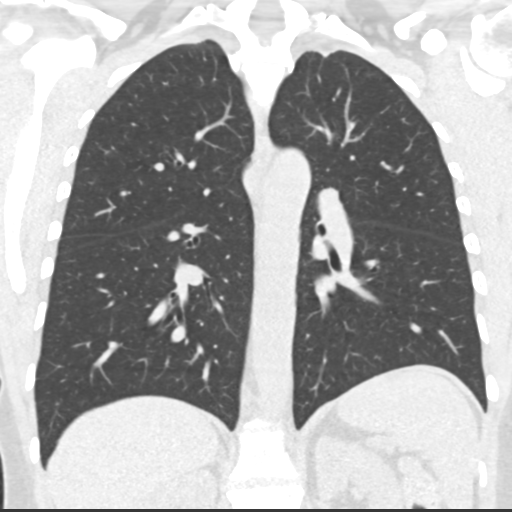

[14 of 36 positions shown; findings below may reference images not displayed]

FINDINGS: Cardiovascular: Heart size is normal. There is no significant
pericardial fluid, thickening or pericardial calcification. Aortic
atherosclerosis. No definite coronary artery calcifications. Stent
in the left main pulmonary artery.

Mediastinum/Nodes: No pathologically enlarged mediastinal or hilar
lymph nodes. Please note that accurate exclusion of hilar adenopathy
is limited on noncontrast CT scans. Esophagus is unremarkable in
appearance. No axillary lymphadenopathy.

Lungs/Pleura: High-resolution images demonstrate no significant
regions of ground-glass attenuation, septal thickening, subpleural
reticulation, traction bronchiectasis or honeycombing to indicate
interstitial lung disease. Inspiratory and expiratory imaging is
unremarkable. No acute consolidative airspace disease. No pleural
effusions. Scattered tiny 2-3 mm pulmonary nodules are noted in the
lungs, nonspecific, but statistically likely benign.

Upper Abdomen: Unremarkable.

Musculoskeletal: There are no aggressive appearing lytic or blastic
lesions noted in the visualized portions of the skeleton.
IMPRESSION: 1. No evidence of interstitial lung disease.
2. No acute findings in the thorax.
3. Tiny 2-3 mm pulmonary nodules in the lungs, nonspecific, but
statistically likely benign. No follow-up needed if patient is
low-risk (and has no known or suspected primary neoplasm).
Non-contrast chest CT can be considered in 12 months if patient is
high-risk. This recommendation follows the consensus statement:
Guidelines for Management of Incidental Pulmonary Nodules Detected
[DATE].
4. Left main pulmonary artery stent noted.
5. Aortic atherosclerosis.

Aortic Atherosclerosis ([id number]).

## 2022-03-31 ENCOUNTER — Other Ambulatory Visit: Payer: Self-pay | Admitting: Cardiology

## 2022-03-31 ENCOUNTER — Encounter: Payer: Self-pay | Admitting: Cardiology

## 2022-03-31 MED ORDER — AMLODIPINE BESYLATE 2.5 MG PO TABS: 2.5000 mg | ORAL_TABLET | Freq: Every day | ORAL | 3 refills | Status: DC

## 2022-04-03 ENCOUNTER — Encounter: Payer: Self-pay | Admitting: *Deleted

## 2022-04-10 ENCOUNTER — Ambulatory Visit: Payer: BC Managed Care – PPO | Admitting: Obstetrics & Gynecology

## 2022-04-10 ENCOUNTER — Telehealth: Payer: Self-pay

## 2022-04-10 ENCOUNTER — Ambulatory Visit (INDEPENDENT_AMBULATORY_CARE_PROVIDER_SITE_OTHER): Payer: BC Managed Care – PPO

## 2022-04-10 DIAGNOSIS — R102 Pelvic and perineal pain: Secondary | ICD-10-CM | POA: Diagnosis not present

## 2022-04-10 DIAGNOSIS — D250 Submucous leiomyoma of uterus: Secondary | ICD-10-CM | POA: Diagnosis not present

## 2022-04-10 DIAGNOSIS — N926 Irregular menstruation, unspecified: Secondary | ICD-10-CM | POA: Diagnosis not present

## 2022-04-10 NOTE — Telephone Encounter (Signed)
-----   Message from Princess Bruins, MD sent at 04/10/2022  3:07 PM EDT ----- ?Regarding: Schedule surgery ?Surgery: CPT 58558 - Hysteroscopy/D&C/Myosure, CPT [phone] - Hysteroscopy/D&C/Novasure ? ?Diagnosis: N92.1 Menometrorrhagia, D25.9 Fibroids ? ?Location: [place] ? ?Status: Outpatient ? ?Time: 30 Minutes ? ?Assistant: N/A ? ?Urgency: First Available ? ?Pre-Op Appointment: To Be Scheduled ? ?Post-Op Appointment(s): 2 Weeks ? ?Time Out Of Work: Day Of Surgery, 1 Day Post Op  ? ?

## 2022-04-10 NOTE — Progress Notes (Signed)
? ? ?  Kara Anderson Dec 02, 1976 [id number] ? ? ?     45 y.o.  G0 ? ?RP: Menometrorrhagia/Possible SM Fibroid/Polyps for Sonohysto ? ?HPI: Menometrorrhagia not controled on Progestin treatment.  Rt pelvic pain intermittently.  Urine/BMs normal. ? ? ?OB History  ?Gravida Para Term Preterm AB Living  ?0 0 0 0 0 0  ?SAB IAB Ectopic Multiple Live Births  ?0 0 0 0    ?Obstetric Comments  ?1st Menstrual Cycle:  12  ? ? ?Past medical history,surgical history, problem list, medications, allergies, family history and social history were all reviewed and documented in the EPIC chart. ? ? ?Directed ROS with pertinent positives and negatives documented in the history of present illness/assessment and plan. ? ?Exam: ? ?There were no vitals filed for this visit. ?General appearance:  Normal ? ?                                                                  Sono Infusion Hysterogram ( procedure note) ? ? ?The initial transvaginal ultrasound demonstrated the following: ? ?T/V images.  Anteverted uterus normal in size with a thin symmetrical endometrium measured at 1.9 mm.  Both ovaries are normal.  Bilateral adnexa normal.  No free fluid. ? ? ?The speculum  was inserted and the cervix cleansed with Betadine solution after confirming that patient has no allergies.A small sonohysterography catheterwas utilized.  Insertion was facilitated with ring forceps, using a spear-like motion the catheter was inserted to the fundus of the uterus. The speculum is then removed carefully to avoid dislodging the catheter. ?The catheter was flushed with sterile saline delete prior to insertion to rid it of small amounts of air.the sterile saline solution was infused into the uterine cavity as a vaginal ultrasound probe was then placed in the vagina for full visualization of the uterine cavity from a transvaginal approach. The following was noted: ? ?Sonohysterogram performed: 0.6 x 0.6 cm partially intracavitary submucosal/intramural fibroid.  About a  third of the fibroid is intracavitary. ? ?The catheter was then removed after retrieving some of the saline from the intrauterine cavity. An endometrial biopsy was not done. Patient tolerated procedure well. She had received a tablet of Aleve for discomfort.   ? ? ?Assessment/Plan:  46 y.o. G0 ? ?1. Irregular bleeding ?Menometrorrhagia not controled on Progestin treatment.  Rt pelvic pain intermittently.  Urine/BMs normal.  Pelvic US findings thoroughly reviewed. ? ?2. Submucous uterine fibroid ?0.6 cm submucousal/intramural fibroid, about 1/3 submucosal.  Decision to proceed with a HSC/Myosure Excision/D+C.  Surgery and risks reviewed.  F/U Preop visit. ? ?3. Pelvic pain in female  ?Pelvic US findings reviewed.  Normal right ovary and adnexa.  Reassured.  Intermittent pain possibly ovulatory pain or GI origin. ? ? ?Princess Bruins MD, 3:06 PM 04/10/2022 ? ? ? ?  ?

## 2022-04-16 ENCOUNTER — Encounter: Payer: Self-pay | Admitting: Obstetrics & Gynecology

## 2022-04-16 NOTE — Telephone Encounter (Signed)
Spoke with patient regarding surgery benefits. Patient acknowledges understanding of information presented. Patient is aware that benefits presented are professional benefits only. Patient is aware the hospital will call with facility benefits. See account note. ? ?Patient stated that she would talk to her husband and return call to the office if and when she was ready to proceed with scheduling. ? ?Routing to Glorianne Manchester, Therapist, sports.  ?

## 2022-04-24 NOTE — Telephone Encounter (Signed)
Call from patient inquiring about benefit difference between Hysteroscopy/D&C/Novasure and Hysterectomy. Informed patient that she may need an office visit with Dr. Dellis Filbert to discuss having a hysterectomy and to see if she is a candidate for this procedure. Patient is agreeable to office visit, if she decides to proceed with hysterectomy. Patient is still undecided on how she would like to proceed and will call back when and how she would like to schedule. ? ?Dr. Dellis Filbert, is the patient a candidate for a hysterectomy? Would an office visit be required prior to scheduling hysterectomy or could that be discussed at the pre-op appointment? ? ?Cc: Kara Anderson ?

## 2022-04-24 NOTE — Telephone Encounter (Signed)
Princess Bruins, MD  You; Carder, Hayley 3 hours ago (1:32 PM)  ? ?Yes she is a candidate and she needs to come for a visit to discuss with me.  That visit could serve as the preop for either surgery.  The important aspect is to make sure we can have the Robot available for an XI Robotic TLH/BS if she decides for a Hysterectomy.  So, probably needing that appointment to be advanced.   ? ?Call returned to patient, left detailed message, ok per dpr. Advised per Dr. Dellis Filbert, advised patient to return call to further discuss at [phone], OPT 5.  ? ? ?

## 2022-04-29 ENCOUNTER — Encounter: Payer: Self-pay | Admitting: Pulmonary Disease

## 2022-04-29 ENCOUNTER — Ambulatory Visit: Payer: BC Managed Care – PPO | Admitting: Pulmonary Disease

## 2022-04-29 VITALS — BP 98/52 | HR 72 | Ht 63.0 in | Wt 167.2 lb

## 2022-04-29 DIAGNOSIS — R0982 Postnasal drip: Secondary | ICD-10-CM

## 2022-04-29 DIAGNOSIS — R0602 Shortness of breath: Secondary | ICD-10-CM | POA: Diagnosis not present

## 2022-04-29 DIAGNOSIS — R059 Cough, unspecified: Secondary | ICD-10-CM

## 2022-04-29 MED ORDER — FLUTICASONE PROPIONATE HFA 110 MCG/ACT IN AERO: 2.0000 | INHALATION_SPRAY | Freq: Two times a day (BID) | RESPIRATORY_TRACT | 12 refills | Status: DC

## 2022-04-29 MED ORDER — IPRATROPIUM BROMIDE 0.03 % NA SOLN: 2.0000 | Freq: Two times a day (BID) | NASAL | 12 refills | Status: DC

## 2022-04-29 NOTE — Patient Instructions (Addendum)
Start flovent inhaler 2 puffs twice daily ?- rinse mouth out after each use ? ?Start ipratropium nasal spray, 2 sprays per nostril twice daily. Use mainly at bedtime if you don't want to use twice per day.  ? ?Continue pantoprazole daily. Continue to sleep with the head of the bed elevated.Recommend eating 2 hours prior to bedtime.  ? ?Follow up in 3 months with pulmonary function tests ?

## 2022-04-29 NOTE — Progress Notes (Signed)
Synopsis: Referred in December 2022 for Cough by Merri Ray, MD  Subjective:   PATIENT ID: Kara Anderson GENDER: female DOB: 01-14-1976, MRN: [id number]  HPI  Chief Complaint  Patient presents with   Follow-up    Throat irritation and productive cough over the past few months. With exertion, her throat tends to become irritated which causes her to become SOB.   Throat irritation started last Saturday.    Kara Anderson is a 46 year old woman, never smoker with history of seronegative rheumatoid arthritis, Takayasu's arteritis, pulmonary artery stenosis s/p stent, microvascular angina, GERD and hyperlipidemia who returns to pulmonary clinic for cough.   She had HRCT chest 12/13/21 which did not show evidence of ILD. Her esophagus was intermittently patent throughout.   She was seen at [facility] 04/10/22 for sinusitis with congestion, green mucous, headache, ear ache and sinus pressure. She was not treated with antibiotics or steroids.  She did elevate the head of her bed after CT scan results. Throat burning improved.   Throat soreness picked up this past weekend.   She had viral illness in April also noted to have night sweats. She took prednisone for that. She then noted water blisters on her eyes and eye crust.   She started amoxicillin beginning of last week. Then Saturday she woke up with sore throat.   If she is doing farm chores or cleaning her house, she feels short of breath. When air hits the back of her throat she starts to cough. She is coughing up thick white phlegm.   She is not able to use Flonase as it gives her epistaxis. She is applying Gentamicin ointment for nasal sores.    OV 12/12/21 She reports having cough over the past 2-3 months that has been productive, initially with green mucous which has turned to yellowish-brown after antibiotic treatment with Zpak. The mucous is currently thick white. She does report sinus/nasal congestion which is worse in the  morning when she wakes up. She also notices increase cough with activity such as walking, carrying groceries or carrying feed/water to her chickens. She denies any dyspnea or wheezing. She does use sinus rinses occasionally. She is using nasal gentamicin for healing ulcers. She is not using any other nasal sprays.  She has been started on omeprazole for concern of GERD due to history of dry cough without much improvement in her symptoms.   She also reports night sweats over the past 3 months which are better when taking the Zpak. She also has periods of hot flashes and chills intermittently. She denies fevers. She denies any weight or appetite changes. She reports the skin on her neck and chest have been itchy recently due to the sweats, but no raised skin rashes.  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 They have walk in chicken coups which she will clean.   She is currently taking actemra weekly ad methotrexate weekly for her seronegative rheumatoid arthritis. She has diffuse migrating joint aches. She has morning stiffness for 15-30 minutes. She has been on daily prednisone which has been weaned off on 11/26/21.  Rheumatology note reviewed from 11/19/21 and Cardiology note 11/15/21 reviewed.  Past Medical History:  Diagnosis Date   Allergy    Anxiety    Arthritis    Decreased libido 09/29/2019   Depression    Endometriosis 2007,2009   Family history of brain cancer    Family history of breast cancer    Family  history of colon cancer    Family history of pancreatic cancer    GERD (gastroesophageal reflux disease)    Heart murmur    HLD (hyperlipidemia) 09/29/2019   Malaise and fatigue 09/29/2019   Mediastinal adenopathy    Mononucleosis    Seasonal allergies    Strep throat    Takayasu's arteritis ([place])    Thyroid disease    hyothyroidism   Viral meningitis    Vitamin D deficiency disease 09/29/2019     Family History  Problem Relation Age of Onset   Heart disease  Mother    Stroke Mother    Diabetes Mother    Brain cancer Mother 56       glioblastoma   Heart disease Father    Alcohol abuse Father    Alcoholism Father    Other Sister        FAP   Colonic polyp Sister 41       adenomatous polyp   Breast cancer Maternal Aunt    Lung cancer Paternal Uncle 84   Other Paternal Uncle 12       MVA   Pancreatic disease Maternal Grandmother    Stomach cancer Maternal Grandmother    Colon cancer Maternal Grandmother    Heart attack Paternal Grandfather    Leukemia Cousin 12   Breast cancer Cousin 56       neg GT   Esophageal cancer Neg Hx    Liver disease Neg Hx    Rectal cancer Neg Hx      Social History   Socioeconomic History   Marital status: Married    Spouse name: Not on file   Number of children: Not on file   Years of education: Not on file   Highest education level: Not on file  Occupational History   Not on file  Tobacco Use   Smoking status: Never    Passive exposure: Never   Smokeless tobacco: Never  Vaping Use   Vaping Use: Never used  Substance and Sexual Activity   Alcohol use: No    Alcohol/week: 0.0 standard drinks   Drug use: No   Sexual activity: Yes    Partners: Male    Birth control/protection: None  Other Topics Concern   Not on file  Social History Narrative   Married for 12 years.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   Social Determinants of Health   Financial Resource Strain: Not on file  Food Insecurity: Not on file  Transportation Needs: Not on file  Physical Activity: Not on file  Stress: Not on file  Social Connections: Not on file  Intimate Partner Violence: Not on file     Allergies  Allergen Reactions   Codeine Nausea Only     Outpatient Medications Prior to Visit  Medication Sig Dispense Refill   amLODipine (NORVASC) 2.5 MG tablet Take 1 tablet (2.5 mg total) by mouth daily. 90 tablet 3   amoxicillin (AMOXIL) 875 MG tablet Take 875 mg by mouth 2 (two)  times daily.     aspirin 81 MG EC tablet Take by mouth.     celecoxib (CELEBREX) 200 MG capsule Take 200 mg by mouth 2 (two) times daily.     Cholecalciferol (VITAMIN D-3) 125 MCG (5000 UT) TABS Take 5,000 Units by mouth daily.     co-enzyme Q-10 30 MG capsule Take 30 mg by mouth daily.     Cyanocobalamin (B-12 PO) Take 3,000 mg by mouth daily.  folic acid (FOLVITE) 0.5 MG tablet Take 1 mg by mouth daily.     gabapentin (NEURONTIN) 300 MG capsule '600mg'$  by mouth in the AM, '600mg'$  by mouth at bedtime     inFLIXimab-axxq (AVSOLA) 100 MG injection Inject into the vein every 2 (two) months.     isosorbide mononitrate (IMDUR) 120 MG 24 hr tablet Take 1 tablet (120 mg total) by mouth 2 (two) times daily. 180 tablet 3   LORazepam (ATIVAN) 0.5 MG tablet TAKE 1 TABLET(0.5 MG) BY MOUTH TWICE DAILY AS NEEDED FOR ANXIETY 60 tablet 0   methotrexate (RHEUMATREX) 2.5 MG tablet SMARTSIG:10 Tablet(s) By Mouth Once a Week     Multiple Vitamins-Minerals (MULTIVITAMIN ADULT) CHEW Chew 2 tablets by mouth daily.     nitroGLYCERIN (NITRODUR - DOSED IN MG/24 HR) 0.3 mg/hr patch Place 1 patch (0.3 mg total) onto the skin daily. 30 patch 12   nitroGLYCERIN (NITROSTAT) 0.4 MG SL tablet Place 1 tablet (0.4 mg total) under the tongue every 5 (five) minutes as needed for chest pain. 25 tablet 3   omeprazole (PRILOSEC) 40 MG capsule Take 40 mg by mouth daily.     progesterone (PROMETRIUM) 100 MG capsule Take 1 capsule by mouth at bedtime.     ranolazine (RANEXA) 500 MG 12 hr tablet Take 2 tablets (1,000 mg total) by mouth 2 (two) times daily. 180 tablet 3   Turmeric 500 MG CAPS Take 1,000 mg by mouth daily.     HUMIRA PEN 40 MG/0.4ML PNKT SMARTSIG:40 Milligram(s) SUB-Q Every 2 Weeks     No facility-administered medications prior to visit.   Review of Systems  Constitutional:  Negative for chills, fever, malaise/fatigue and weight loss.  HENT:  Positive for congestion. Negative for sinus pain and sore throat.   Eyes:  Negative.   Respiratory:  Positive for cough, sputum production and shortness of breath. Negative for hemoptysis and wheezing.   Cardiovascular:  Positive for chest pain. Negative for palpitations, orthopnea, claudication and leg swelling.  Gastrointestinal:  Positive for abdominal pain and heartburn. Negative for nausea and vomiting.  Genitourinary: Negative.   Musculoskeletal:  Positive for joint pain. Negative for myalgias.  Skin:  Negative for rash.  Neurological:  Positive for headaches. Negative for weakness.  Endo/Heme/Allergies: Negative.   Psychiatric/Behavioral: Negative.     Objective:   Vitals:   04/29/22 1023  BP: (!) 98/52  Pulse: 72  SpO2: 96%  Weight: 167 lb 3.2 oz (75.8 kg)  Height: '5\' 3"'$  (1.6 m)     Physical Exam Constitutional:      General: She is not in acute distress.    Appearance: She is not ill-appearing.  HENT:     Head: Normocephalic and atraumatic.     Nose: No congestion or rhinorrhea.     Mouth/Throat:     Mouth: Mucous membranes are moist.     Pharynx: Oropharynx is clear. No oropharyngeal exudate or posterior oropharyngeal erythema.  Eyes:     General: No scleral icterus.    Conjunctiva/sclera: Conjunctivae normal.  Cardiovascular:     Rate and Rhythm: Normal rate and regular rhythm.     Pulses: Normal pulses.     Heart sounds: Normal heart sounds. No murmur heard. Pulmonary:     Effort: Pulmonary effort is normal.     Breath sounds: Normal breath sounds. No wheezing, rhonchi or rales.  Musculoskeletal:     Right lower leg: No edema.     Left lower leg: No edema.  Skin:  General: Skin is warm and dry.  Neurological:     General: No focal deficit present.     Mental Status: She is alert.  Psychiatric:        Mood and Affect: Mood normal.        Behavior: Behavior normal.        Thought Content: Thought content normal.        Judgment: Judgment normal.   CBC    Component Value Date/Time   WBC 4.9 08/02/2021 1839   RBC 3.70  (L) 08/02/2021 1839   HGB 12.9 08/02/2021 1839   HGB 10.1 (L) 11/19/2017 1026   HCT 37.6 08/02/2021 1839   HCT 31.7 (L) 11/19/2017 1026   PLT 279 08/02/2021 1839   PLT 331 11/19/2017 1026   MCV 101.6 (H) 08/02/2021 1839   MCV 86.4 11/19/2017 1026   MCH 34.9 (H) 08/02/2021 1839   MCHC 34.3 08/02/2021 1839   RDW 12.1 08/02/2021 1839   RDW 14.6 (H) 11/19/2017 1026   LYMPHSABS 1.2 08/02/2021 1839   LYMPHSABS 1.2 11/19/2017 1026   MONOABS 0.5 08/02/2021 1839   MONOABS 0.5 11/19/2017 1026   EOSABS 0.1 08/02/2021 1839   EOSABS 0.1 11/19/2017 1026   BASOSABS 0.0 08/02/2021 1839   BASOSABS 0.0 11/19/2017 1026      Latest Ref Rng & Units 08/02/2021    6:39 PM 05/09/2021   10:50 AM 02/16/2021   10:18 AM  BMP  Glucose 70 - 99 mg/dL 94   93   83    BUN 6 - 20 mg/dL '16   15   13    '$ Creatinine 0.44 - 1.00 mg/dL 0.82   0.81   0.63    BUN/Creat Ratio 6 - 22 (calc)  NOT APPLICABLE     Sodium 314 - 145 mmol/L 130   132   136    Potassium 3.5 - 5.1 mmol/L 3.8   4.8   3.9    Chloride 98 - 111 mmol/L 99   97   103    CO2 22 - 32 mmol/L '27   26   23    '$ Calcium 8.9 - 10.3 mg/dL 8.2   9.3   9.4     Chest imaging: HRCT Chest 12/13/21 1. No evidence of interstitial lung disease. 2. No acute findings in the thorax. 3. Tiny 2-3 mm pulmonary nodules in the lungs, nonspecific, but statistically likely benign. No follow-up needed if patient is low-risk (and has no known or suspected primary neoplasm). Non-contrast chest CT can be considered in 12 months if patient is high-risk. This recommendation follows the consensus statement: Guidelines for Management of Incidental Pulmonary Nodules Detected on CT Images: From the Fleischner Society 2017; Radiology 2017; 284:228-243. 4. Left main pulmonary artery stent noted. 5. Aortic atherosclerosis.  CXR 11/06/21 The heart size and mediastinal contours are within normal limits. Left pulmonary artery stent. Both lungs are clear. The visualized skeletal  structures are unremarkable.  PFT:     View : No data to display.          Labs:  Path:  Echo 01/19/20: LV EF 55 to 60%. Normal RV size and systolic function. LA size is normal. RA size is normal. Tricuspid valve is normal. Aortic valve is normal. Pulmonic valve is normal.  Heart Catheterization:  Assessment & Plan:   No diagnosis found.  Discussion: Kara Anderson is a 46 year old woman, never smoker with history of seronegative rheumatoid arthritis, Takayasu's arteritis, pulmonary artery stenosis  s/p stent, microvascular angina, GERD and hyperlipidemia who is referred to pulmonary clinic for cough.   The etiology of her chronic cough is unknown at this time. Differential includes sinus disease with post-nasal drainage vs GERD vs reactive airways disease.   She is to try ipratropium 2 sprays per nostril twice daily for her post nasal drainage.   She is to continue pantoprazole daily and continue to elevated the head of her bed when sleeping.   We will tray flovent 178mg 2 puffs twice daily and monitor for improvement in her symptoms.   Follow up in 3 months with pulmonary function tests.  JFreda Jackson MD [facility] Office: [phone]    Current Outpatient Medications:    amLODipine (NORVASC) 2.5 MG tablet, Take 1 tablet (2.5 mg total) by mouth daily., Disp: 90 tablet, Rfl: 3   amoxicillin (AMOXIL) 875 MG tablet, Take 875 mg by mouth 2 (two) times daily., Disp: , Rfl:    aspirin 81 MG EC tablet, Take by mouth., Disp: , Rfl:    celecoxib (CELEBREX) 200 MG capsule, Take 200 mg by mouth 2 (two) times daily., Disp: , Rfl:    Cholecalciferol (VITAMIN D-3) 125 MCG (5000 UT) TABS, Take 5,000 Units by mouth daily., Disp: , Rfl:    co-enzyme Q-10 30 MG capsule, Take 30 mg by mouth daily., Disp: , Rfl:    Cyanocobalamin (B-12 PO), Take 3,000 mg by mouth daily., Disp: , Rfl:    folic acid (FOLVITE) 0.5 MG tablet, Take 1 mg by mouth daily., Disp: , Rfl:     gabapentin (NEURONTIN) 300 MG capsule, '600mg'$  by mouth in the AM, '600mg'$  by mouth at bedtime, Disp: , Rfl:    inFLIXimab-axxq (AVSOLA) 100 MG injection, Inject into the vein every 2 (two) months., Disp: , Rfl:    isosorbide mononitrate (IMDUR) 120 MG 24 hr tablet, Take 1 tablet (120 mg total) by mouth 2 (two) times daily., Disp: 180 tablet, Rfl: 3   LORazepam (ATIVAN) 0.5 MG tablet, TAKE 1 TABLET(0.5 MG) BY MOUTH TWICE DAILY AS NEEDED FOR ANXIETY, Disp: 60 tablet, Rfl: 0   methotrexate (RHEUMATREX) 2.5 MG tablet, SMARTSIG:10 Tablet(s) By Mouth Once a Week, Disp: , Rfl:    Multiple Vitamins-Minerals (MULTIVITAMIN ADULT) CHEW, Chew 2 tablets by mouth daily., Disp: , Rfl:    nitroGLYCERIN (NITRODUR - DOSED IN MG/24 HR) 0.3 mg/hr patch, Place 1 patch (0.3 mg total) onto the skin daily., Disp: 30 patch, Rfl: 12   nitroGLYCERIN (NITROSTAT) 0.4 MG SL tablet, Place 1 tablet (0.4 mg total) under the tongue every 5 (five) minutes as needed for chest pain., Disp: 25 tablet, Rfl: 3   omeprazole (PRILOSEC) 40 MG capsule, Take 40 mg by mouth daily., Disp: , Rfl:    progesterone (PROMETRIUM) 100 MG capsule, Take 1 capsule by mouth at bedtime., Disp: , Rfl:    ranolazine (RANEXA) 500 MG 12 hr tablet, Take 2 tablets (1,000 mg total) by mouth 2 (two) times daily., Disp: 180 tablet, Rfl: 3   Turmeric 500 MG CAPS, Take 1,000 mg by mouth daily., Disp: , Rfl:

## 2022-05-02 ENCOUNTER — Telehealth: Payer: Self-pay | Admitting: Pulmonary Disease

## 2022-05-02 NOTE — Telephone Encounter (Signed)
Will forward to Dr. Erin Fulling as Kara Anderson called stating that she was on vacation and had to go to the Emergency Department and was diagnosed with pneumonia.  Patient does not need call back she just wants to communicate.

## 2022-05-04 ENCOUNTER — Encounter: Payer: Self-pay | Admitting: Pulmonary Disease

## 2022-05-07 ENCOUNTER — Encounter (INDEPENDENT_AMBULATORY_CARE_PROVIDER_SITE_OTHER): Payer: BC Managed Care – PPO | Admitting: Cardiology

## 2022-05-07 DIAGNOSIS — I208 Other forms of angina pectoris: Secondary | ICD-10-CM

## 2022-05-07 DIAGNOSIS — R072 Precordial pain: Secondary | ICD-10-CM | POA: Diagnosis not present

## 2022-05-07 NOTE — Telephone Encounter (Signed)
Spoke with patient.   OV scheduled for 05/29/22 at 2:30 for surgery consult.   2. Patient states she has been on IV abx followed by oral Abx for 2 weeks for pneumonia. Patient reports vaginal itching and clumpy white vaginal d/c w/o odor that started today. Patient is requesting Rx for diflucan. Advised patient we do not typically treat over the phone, can try OTC monistat first. Patient request for request to be reviewed with provider. Pharmacy confirmed. Advised I will forward to Dr. Dellis Filbert and f/u. Patient agreeable.   Dr. Dellis Filbert -please advise on Rx.

## 2022-05-08 ENCOUNTER — Encounter: Payer: Self-pay | Admitting: Cardiology

## 2022-05-08 DIAGNOSIS — I208 Other forms of angina pectoris: Secondary | ICD-10-CM

## 2022-05-08 MED ORDER — FLUCONAZOLE 150 MG PO TABS: ORAL_TABLET | ORAL | 1 refills | Status: DC

## 2022-05-08 NOTE — Telephone Encounter (Signed)
Please see the MyChart message reply(ies) for my assessment and plan.    This patient gave consent for this Medical Advice Message and is aware that it may result in a bill to their insurance company, as well as the possibility of receiving a bill for a co-payment or deductible. They are an established patient, but are not seeking medical advice exclusively about a problem treated during an in person or video visit in the last seven days. I did not recommend an in person or video visit within seven days of my reply.    I spent a total of 11 minutes cumulative time within 7 days through MyChart messaging.  Xayden Linsey E Liba Hulsey, MD   

## 2022-05-08 NOTE — Telephone Encounter (Signed)
Princess Bruins, MD  You 28 minutes ago (4:03 PM)   Agree with Fluconazole 150 mg/tab 1 tab PO every other day x 3.  #3, refill x 1.    Patient notified. Advised per Dr. Dellis Filbert.  RX to verified pharmacy.   Encounter closed.

## 2022-05-09 NOTE — Telephone Encounter (Signed)
Pts echo is scheduled for 05/29/22 at 12:50 pm.  Pt made aware of appt date and time by Echo Scheduler.

## 2022-05-09 NOTE — Telephone Encounter (Signed)
Order for echo placed and pt aware she will get a call back from our Echo Scheduler to arrange this appt.  Will make Dr. Johney Frame aware.

## 2022-05-09 NOTE — Telephone Encounter (Signed)
Spoke with Dr. Johney Frame about pt only wanting echo for now.  Per Dr. Johney Frame, okay for the pt to get the echo for now and we will schedule her for a Cardiac PET accordingly, if still needed thereafter.  Pt will have echo on 6/15.  She will see Laurann Montana NP in July, where Cardiac PET can be discussed at that time.  Pt aware of plan.

## 2022-05-13 NOTE — Addendum Note (Signed)
Addended by: Gwyndolyn Kaufman on: 05/13/2022 02:45 PM   Modules accepted: Orders

## 2022-05-13 NOTE — Telephone Encounter (Signed)
Order for Cardiac PET Scan placed in the system, for pt is ready to proceed with this.  She is aware that I will send a message to [facility] Scheduling team to call her back and arrange this test.  Will pend an attestation order in this encounter for Dr. Johney Frame to sign off on, when she returns back to the office next week.   Pt is aware that I will be sending her the Cardiac PET Scan instructions to her via mychart, for she will need to follow them prior to her scan.  Pt agreed to plan.

## 2022-05-13 NOTE — Addendum Note (Signed)
Addended by: Nuala Alpha on: 05/13/2022 09:22 AM   Modules accepted: Orders

## 2022-05-29 ENCOUNTER — Encounter: Payer: Self-pay | Admitting: Obstetrics & Gynecology

## 2022-05-29 ENCOUNTER — Ambulatory Visit (HOSPITAL_COMMUNITY): Payer: BC Managed Care – PPO | Attending: Internal Medicine

## 2022-05-29 ENCOUNTER — Telehealth: Payer: Self-pay | Admitting: *Deleted

## 2022-05-29 ENCOUNTER — Ambulatory Visit: Payer: BC Managed Care – PPO | Admitting: Obstetrics & Gynecology

## 2022-05-29 VITALS — BP 104/68

## 2022-05-29 DIAGNOSIS — R102 Pelvic and perineal pain: Secondary | ICD-10-CM | POA: Diagnosis not present

## 2022-05-29 DIAGNOSIS — N809 Endometriosis, unspecified: Secondary | ICD-10-CM

## 2022-05-29 DIAGNOSIS — D250 Submucous leiomyoma of uterus: Secondary | ICD-10-CM

## 2022-05-29 DIAGNOSIS — I208 Other forms of angina pectoris: Secondary | ICD-10-CM | POA: Diagnosis not present

## 2022-05-29 DIAGNOSIS — N921 Excessive and frequent menstruation with irregular cycle: Secondary | ICD-10-CM

## 2022-05-29 LAB — ECHOCARDIOGRAM COMPLETE
Area-P 1/2: 3.12 cm2
S' Lateral: 2.9 cm

## 2022-05-29 NOTE — Telephone Encounter (Signed)
-----   Message from Princess Bruins, MD sent at 05/29/2022  3:01 PM EDT ----- Regarding: Schedule surgery Surgery: CPT 79432 - XI Robotic Total Laparoscopic Hysterectomy with Bilateral Salpingectomy  Diagnosis: N92.1 Menometrorrhagia, D25.9 Fibroids, History of Mild Endometriosis  Location: [place]  Status: Outpatient  Time: 38 Minutes  Assistant: First Available Provider  Urgency: First Available  Pre-Op Appointment: To Be Scheduled  Post-Op Appointment(s): 2 Weeks, 6 Weeks  Time Out Of Work: 6 Weeks

## 2022-05-30 ENCOUNTER — Other Ambulatory Visit: Payer: Self-pay

## 2022-05-30 MED ORDER — RANOLAZINE ER 500 MG PO TB12: 1000.0000 mg | ORAL_TABLET | Freq: Two times a day (BID) | ORAL | 2 refills | Status: DC

## 2022-05-30 NOTE — Telephone Encounter (Unsigned)
Spoke with patient regarding surgery benefits. Patient acknowledges understanding of information presented. Patient is aware that benefits presented are professional benefits only. Patient is aware the hospital will call with facility benefits. See account note.This insurance quote is not a guarantee of benefits or claims & is subject to change upon insurance company review  Routing to [employer], Therapist, sports.

## 2022-06-02 NOTE — Telephone Encounter (Signed)
See recent echo result where Dr. Maudie Mercury for this pt to wait and have PET Scan done, until after her pulm work-up is complete. Pt aware of this as well.  She will send [place] a staff message when she is ready to schedule her PET Scan, so that we can connect her to [facility] PET Scheduling team thereafter.

## 2022-06-04 NOTE — Telephone Encounter (Signed)
Spoke with patient, reviewed surgery dates. Patient would like to tentatively plan on 09/09/22.  Patient is going to review with spouse and return call.

## 2022-06-12 NOTE — Telephone Encounter (Signed)
Spoke with patient. Patient is requesting to reschedule surgery to 09/09/22. Advised patient I will return call once confirmed, patient agreeable.    Spoke with Colletta Maryland in central scheduling, case rescheduled to 09/09/22 at [place].   Routing to [employer].

## 2022-06-13 ENCOUNTER — Encounter: Payer: Self-pay | Admitting: Obstetrics & Gynecology

## 2022-06-13 NOTE — Progress Notes (Signed)
    Kara Anderson 07-05-1976 [id number]        46 y.o.  G0   RP: Counseling on surgical management of Menometrorrhagia/SM Fibroid and Rt pelvic pain   HPI: Menometrorrhagia not controled on Progestin treatment.  Rt pelvic pain intermittently.  Urine/BMs normal.  Sonohysto 04/10/22 showed a partially SM Fibroid.   OB History  Gravida Para Term Preterm AB Living  0 0 0 0 0 0  SAB IAB Ectopic Multiple Live Births  0 0 0 0    Obstetric Comments  1st Menstrual Cycle:  12    Past medical history,surgical history, problem list, medications, allergies, family history and social history were all reviewed and documented in the EPIC chart.   Directed ROS with pertinent positives and negatives documented in the history of present illness/assessment and plan.  Exam:  Vitals:   05/29/22 1425  BP: 104/68   General appearance:  Normal  Sono Infusion Hysterogram ( procedure note) on 04/10/22:   The initial transvaginal ultrasound demonstrated the following: T/V images.  Anteverted uterus normal in size with a thin symmetrical endometrium measured at 1.9 mm.  Both ovaries are normal.  Bilateral adnexa normal.  No free fluid.   The speculum  was inserted and the cervix cleansed with Betadine solution after confirming that patient has no allergies.A small sonohysterography catheterwas utilized.  Insertion was facilitated with ring forceps, using a spear-like motion the catheter was inserted to the fundus of the uterus. The speculum is then removed carefully to avoid dislodging the catheter. The catheter was flushed with sterile saline delete prior to insertion to rid it of small amounts of air.the sterile saline solution was infused into the uterine cavity as a vaginal ultrasound probe was then placed in the vagina for full visualization of the uterine cavity from a transvaginal approach. The following was noted:  Sonohysterogram performed: 0.6 x 0.6 cm partially intracavitary submucosal/intramural  fibroid (total size of Fibroid) 1.5 cm).  About a third of the fibroid is intracavitary.   The catheter was then removed after retrieving some of the saline from the intrauterine cavity. An endometrial biopsy was not done. Patient tolerated procedure well. She had received a tablet of Aleve for discomfort.       Assessment/Plan:  46 y.o. G0   1. Menometrorrhagia Menometrorrhagia not controled on Progestin treatment.  Rt pelvic pain intermittently.  Sonohysto on 04/10/22 showed a partially SM Fibroid.  Decision to proceed with Hysterectomy.   2. Submucous uterine fibroid 1.5 cm Fibroid, partially SM (0.6 cm).  Patient declines a conservative approach with HSC/Myosure Excision/D+C.  Therefore, decision to proceed with XI Robotic TLH/BS. Surgery and risks reviewed.  F/U Preop visit.   3. Pelvic pain in female  Normal right ovary and adnexa.  Reassured.  Intermittent pain possibly ovulatory pain or GI origin.   4. H/O Endometriosis determined by laparoscopy  Other orders - albuterol (VENTOLIN HFA) 108 (90 Base) MCG/ACT inhaler; Inhale into the lungs. - ondansetron (ZOFRAN-ODT) 8 MG disintegrating tablet; Take 8 mg by mouth 3 (three) times daily. - predniSONE (DELTASONE) 5 MG tablet; Take 5 mg by mouth daily with breakfast.   Counseling and management of menometrorrhagia with submucosal/intramural fibroid and pelvic pain in the context of a history of endometriosis, review of documentation, for 25 minutes.  Princess Bruins MD, 8:43 AM 06/13/2022

## 2022-06-27 ENCOUNTER — Encounter (HOSPITAL_BASED_OUTPATIENT_CLINIC_OR_DEPARTMENT_OTHER): Payer: Self-pay | Admitting: Family

## 2022-06-27 ENCOUNTER — Ambulatory Visit (HOSPITAL_BASED_OUTPATIENT_CLINIC_OR_DEPARTMENT_OTHER): Payer: BC Managed Care – PPO | Admitting: Family

## 2022-06-27 VITALS — BP 110/76 | HR 67 | Ht 63.0 in | Wt 166.1 lb

## 2022-06-27 DIAGNOSIS — Q256 Stenosis of pulmonary artery: Secondary | ICD-10-CM | POA: Diagnosis not present

## 2022-06-27 DIAGNOSIS — I208 Other forms of angina pectoris: Secondary | ICD-10-CM

## 2022-06-27 DIAGNOSIS — R072 Precordial pain: Secondary | ICD-10-CM

## 2022-06-27 DIAGNOSIS — M314 Aortic arch syndrome [Takayasu]: Secondary | ICD-10-CM

## 2022-06-27 MED ORDER — NITROGLYCERIN 0.4 MG SL SUBL: 0.4000 mg | SUBLINGUAL_TABLET | SUBLINGUAL | 3 refills | Status: DC | PRN

## 2022-06-27 NOTE — Progress Notes (Signed)
Office Visit    Patient Name: Kara Anderson Date of Encounter: 06/27/2022  PCP:  Dion Body, MD   [facility]  Cardiologist:  Freada Bergeron, MD  Advanced Practice Provider:  No care team member to display Electrophysiologist:  None    Chief Complaint    Kara Anderson is a 46 y.o. 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, pulmonary stenosis s/p stenting, hyperlipidemia, microvascular dysfunction and chronic angina  presents today for follow up of angina   Past Medical History    Past Medical History:  Diagnosis Date   Allergy    Anxiety    Arthritis    Decreased libido 09/29/2019   Depression    Endometriosis 2007,2009   Family history of brain cancer    Family history of breast cancer    Family history of colon cancer    Family history of pancreatic cancer    GERD (gastroesophageal reflux disease)    Heart murmur    HLD (hyperlipidemia) 09/29/2019   Malaise and fatigue 09/29/2019   Mediastinal adenopathy    Mononucleosis    Seasonal allergies    Strep throat    Takayasu's arteritis ([place])    Thyroid disease    hyothyroidism   Viral meningitis    Vitamin D deficiency disease 09/29/2019   Past Surgical History:  Procedure Laterality Date   HIP SURGERY  12/16/2011   IR THORACENTESIS ASP PLEURAL SPACE W/IMG GUIDE  11/20/2017   LAPAROSCOPIC ABDOMINAL EXPLORATION  2007,2009   endometriosis   MEDIASTINOTOMY CHAMBERLAIN MCNEIL    Left 11/16/2017   Procedure: LEFT ANTERIOR MEDIASTINOTOMY CHAMBERLAIN PROCEDURE;  Surgeon: [name], MD;  Location: [facility] OR;  Service: Thoracic;  Laterality: Left;   NOSE SURGERY  12/15/2005   sinus surgery - reconstructe turbinates and deviated septum    Allergies  Allergies  Allergen Reactions   Codeine Nausea Only    History of Present Illness    Kara Anderson is a 46 y.o. female with a hx of takayasu arteriolitis followed by rheumatology  on methotrexate, pulmonary stenosis s/p stenting, hyperlipidemia, microvascular dysfunction and chronic angina last seen 03/06/22.   Previously followed by [facility] cardiology. Chest CT 07/2019 with patent pulmonary artery stent and no significant CAD. Seen at [facility] clinic and diagnosed with microvascular dysfunction due to persistent substernal pain.   Has followed closely with Dr. Johney Frame due to microvascular angina. October 2022 due to worsening angina she was thought to becoming nitrate tolerant and nitroglycerin patch reduced with initiate dof Amlodipine, Metoprolol. She was eventually transitioned from Amlodipine to Diltiazem but had hypotension. Last visit 02/2022 was recommended continue Imdur '120mg'$  BID, Ranexa '1000mg'$  BID, and retrial nitro patch 0.'3mg'$  QD. Metoprolol discontinued due to fatigue.   She was admitted 04/2022 with pneumonia. She has since established with Dr. Lanney Gins of [facility] pulmonology. Last seen 06/18/22 by Dr. Lanney Gins due to hypersensitivity pneumonitis with Farmer's lung (abnormal CT with ground glass infiltrates) recommended for Itraconazole '100mg'$  QD x 6 months and Prednisone '5mg'$  QD.  Presents today for follow up. Notes since starting Trelegy her breathing feels improved. Still with some squeezing chest pain. She has been trying to exercise in the pool and does not yesterday halfway dirving home had a midsternal chest pain, pressure. She did take a nitroglycerin. Also had to take nitroglycerin while walking through the store with discomfort that radiates through to her shoulder and back. Feels her joint pain is not under  as good of control and worries about inflammation given her underlying vasculitis.   EKGs/Labs/Other Studies Reviewed:   The following studies were reviewed today:  TTE 01/19/20:  1. Left ventricular ejection fraction, by visual estimation, is 55 to  60%. The left ventricle has normal function. There is no left ventricular  hypertrophy.   2. The left ventricle  has no regional wall motion abnormalities.   3. Global right ventricle has normal systolic function.The right  ventricular size is normal. No increase in right ventricular wall  thickness.   4. Left atrial size was normal.   5. Right atrial size was normal.   6. The mitral valve is normal in structure. Mild mitral valve  regurgitation. No evidence of mitral stenosis.   7. The tricuspid valve is normal in structure.   8. The tricuspid valve is normal in structure. Tricuspid valve  regurgitation is mild.   9. The aortic valve is normal in structure. Aortic valve regurgitation is  not visualized. No evidence of aortic valve sclerosis or stenosis.  10. The pulmonic valve was normal in structure. Pulmonic valve  regurgitation is not visualized.  11. Normal pulmonary artery systolic pressure.  12. The tricuspid regurgitant velocity is 1.66 m/s, and with an assumed  right atrial pressure of 3 mmHg, the estimated right ventricular systolic  pressure is normal at 14.0 mmHg.  13. The inferior vena cava is normal in size with greater than 50%  respiratory variability, suggesting right atrial pressure of 3 mmHg.  14. The average left ventricular global longitudinal strain is 21.2 %.   Carotid ultrasound 10/2017: Final Interpretation:  Right Carotid: There was no evidence of thrombus, dissection,  atherosclerotic                 plaque or stenosis in the cervical carotid system.                 Imaging over the aortic arch shows what appears to be color                 turbulence near the pulmonary artery. Short axis and  subcostal                 views of the heart revealed peak CW gradient of the  pulmonic                 artery in the range of 40-54 mmHg. Pulmonic insufficiency  also                 noted.   Left Carotid: There was no evidence of thrombus, dissection,  atherosclerotic                plaque or stenosis in the cervical carotid system.   Vertebrals:  Both vertebral  arteries were patent with antegrade flow.  Subclavians: Normal flow hemodynamics were seen in bilateral subclavian              arteries.               There is also a mobile linear structure in the right  subclavian               vein Discussed   with lead tech who thought structure was a valve but appears longer than usual   EKG:  EKG ordered today. EKG performed today reveals NSR 67 bpm with no acute ST/T wave changes.   Recent Labs: 08/02/2021: ALT 17; BUN 16; Creatinine, Ser  0.82; Hemoglobin 12.9; Platelets 279; Potassium 3.8; Sodium 130  Recent Lipid Panel    Component Value Date/Time   CHOL 261 (H) 05/09/2021 1050   TRIG 137 05/09/2021 1050   HDL 69 05/09/2021 1050   CHOLHDL 3.8 05/09/2021 1050   LDLCALC 165 (H) 05/09/2021 1050   Home Medications   Current Meds  Medication Sig   albuterol (VENTOLIN HFA) 108 (90 Base) MCG/ACT inhaler Inhale into the lungs.   amLODipine (NORVASC) 2.5 MG tablet Take 1 tablet (2.5 mg total) by mouth daily.   aspirin 81 MG EC tablet Take by mouth.   celecoxib (CELEBREX) 200 MG capsule Take 200 mg by mouth 2 (two) times daily.   Cholecalciferol (VITAMIN D-3) 125 MCG (5000 UT) TABS Take 5,000 Units by mouth daily.   Cyanocobalamin (B-12 PO) Take 3,000 mg by mouth daily.   Fluticasone-Umeclidin-Vilant (TRELEGY ELLIPTA) 200-62.5-25 MCG/ACT AEPB Inhale 1 Inhalation into the lungs daily at 12 noon.   folic acid (FOLVITE) 0.5 MG tablet Take 1 mg by mouth daily.   gabapentin (NEURONTIN) 300 MG capsule '600mg'$  by mouth in the AM, '600mg'$  by mouth noon, '900mg'$  at bedtime   inFLIXimab-axxq (AVSOLA) 100 MG injection Inject into the vein every 2 (two) months.   isosorbide mononitrate (IMDUR) 120 MG 24 hr tablet Take 1 tablet (120 mg total) by mouth 2 (two) times daily.   itraconazole (SPORANOX) 100 MG capsule Take 1 capsule by mouth daily.   methotrexate (RHEUMATREX) 2.5 MG tablet SMARTSIG:10 Tablet(s) By Mouth Once a Week   Multiple Vitamins-Minerals  (MULTIVITAMIN ADULT) CHEW Chew 2 tablets by mouth daily.   nitroGLYCERIN (NITRODUR - DOSED IN MG/24 HR) 0.3 mg/hr patch Place 1 patch (0.3 mg total) onto the skin daily.   nitroGLYCERIN (NITROSTAT) 0.4 MG SL tablet Place 1 tablet (0.4 mg total) under the tongue every 5 (five) minutes as needed for chest pain.   omeprazole (PRILOSEC) 40 MG capsule Take 40 mg by mouth daily.   ondansetron (ZOFRAN-ODT) 8 MG disintegrating tablet Take 8 mg by mouth 3 (three) times daily.   predniSONE (DELTASONE) 5 MG tablet Take 5 mg by mouth daily with breakfast.   progesterone (PROMETRIUM) 100 MG capsule Take 2 capsules by mouth at bedtime.   ranolazine (RANEXA) 500 MG 12 hr tablet Take 2 tablets (1,000 mg total) by mouth 2 (two) times daily.     Review of Systems      All other systems reviewed and are otherwise negative except as noted above.  Physical Exam    VS:  BP 110/76 (BP Location: Left Arm, Patient Position: Sitting, Cuff Size: Large)   Pulse 67   Ht '5\' 3"'$  (1.6 m)   Wt 166 lb 1.6 oz (75.3 kg)   LMP 05/19/2022 (Exact Date)   BMI 29.42 kg/m  , BMI Body mass index is 29.42 kg/m.  Wt Readings from Last 3 Encounters:  06/27/22 166 lb 1.6 oz (75.3 kg)  04/29/22 167 lb 3.2 oz (75.8 kg)  03/06/22 166 lb 9.6 oz (75.6 kg)   GEN: Well nourished, well developed, in no acute distress. HEENT: normal. Neck: Supple, no JVD, carotid bruits, or masses. Cardiac: RRR, no murmurs, rubs, or gallops. No clubbing, cyanosis, edema.  Radials/PT 2+ and equal bilaterally.  Respiratory:  Respirations regular and unlabored, clear to auscultation bilaterally. GI: Soft, nontender, nondistended. MS: No deformity or atrophy. Skin: Warm and dry, no rash. Neuro:  Strength and sensation are intact. Psych: Normal affect.  Assessment & Plan    Microvascular  dysfunction with angina - Diagnosed initially at [facility]. Still with occasional angina despite Imdur '120mg'$  BID, Amlodipine 2.'5mg'$  QD, Nitro patch 0.'3mg'$ , Ranexa  1044mBID, PRN nitro tablet. Did not tolerate Metoprolol due to fatigue, diltiazem due to edema. Update cardiac PET to reassess for coronary atherosclerosis and microvascular evaluation.   Pneumonitis and Farmer's Lung - Follows with Dr. ALanney Ginsof pulmonology. Improvement in dyspnea since Trelegy. Presently on Itraconazole and Prednisone.   Pulmonary artery stenosis s/p stenting 09/09/18 - Follows with [facility]. 06/2018 echo increased velocities through pulmonary artery peak MPA velocity 2.45 m/s, peak gradient 252mg. 06/2018 chest MRI stenosis of R and L main pulmonary arteries likely related to prior Takayasu arteritis. 08/2018 RHC with RA mean 7 mmHg, RV 33/6 mmHg, PA 32/18 mean 17, PWP -9m69m, index 4.6 PTA of left pulmonary artery. 08/2018 EF >55%, dilated PA. Continue Aspirin, Imdur.   Takayatsu Arteritis - Follows with rheumatology on methotrexate.   Disposition: Follow up in 4 mos with Dr. PemJohney Frame scheduled  Signed, CaiLoel DubonnetP 06/27/2022, 10:43 AM [place]

## 2022-06-27 NOTE — Patient Instructions (Addendum)
Medication Instructions:  Continue your current medications.   *If you need a refill on your cardiac medications before your next appointment, please call your pharmacy*  Lab Work: None ordered today.   Testing/Procedures: Your EKG today shows normal sinus rhythm which is a good result.   Follow-Up: At [facility], you and your health needs are our priority.  As part of our continuing mission to provide you with exceptional heart care, we have created designated Provider Care Teams.  These Care Teams include your primary Cardiologist (physician) and Advanced Practice Providers (APPs -  Physician Assistants and Nurse Practitioners) who all work together to provide you with the care you need, when you need it.  We recommend signing up for the patient portal called "MyChart".  Sign up information is provided on this After Visit Summary.  MyChart is used to connect with patients for Virtual Visits (Telemedicine).  Patients are able to view lab/test results, encounter notes, upcoming appointments, etc.  Non-urgent messages can be sent to your provider as well.   To learn more about what you can do with MyChart, go to [email or web address].    Your next appointment:   3-4 month(s)  The format for your next appointment:   In Person  Provider:   Freada Bergeron, MD    Other Instructions  How to Prepare for Your Cardiac PET/CT Stress Test:  They will call you to schedule.  1. Please do not take these medications before your test:    Medications that may interfere with the cardiac pharmacological stress agent (ex. nitrates or beta-blockers) the day of the exam. HOLD ALL YOUR NITRATES THE DAY OF THIS EXAM--THIS INCLUDES YOUR NITROGLYCERINS, NITRO PATCHES, AND YOUR IMDUR (ISOSORBIDE MONONITRATE)    Your remaining medications may be taken with water.  2. Nothing to eat or drink, except water, 3 hours prior to arrival time.   NO caffeine/decaffeinated products, or chocolate 12  hours prior to arrival.  3. NO perfume, cologne or lotion  4. Total time is 1 to 2 hours; you may want to bring reading material for the waiting time.  5. Please report to Admitting at the [place] Entrance 60 minutes early for your test.       [place], [place] [address]       IF YOU THINK YOU MAY BE PREGNANT, OR ARE NURSING PLEASE INFORM THE TECHNOLOGIST.   In preparation for your appointment, medication and supplies will be purchased.  Appointment availability is limited, so if you need to cancel or reschedule, please call the Radiology Department at [phone]  24 hours in advance to avoid a cancellation fee of $100.00  What to Expect After you Arrive:  Once you arrive and check in for your appointment, you will be taken to a preparation room within the Radiology Department.  A technologist or Nurse will obtain your medical history, verify that you are correctly prepped for the exam, and explain the procedure.  Afterwards,  an IV will be started in your arm and electrodes will be placed on your skin for EKG monitoring during the stress portion of the exam. Then you will be escorted to the PET/CT scanner.  There, staff will get you positioned on the scanner and obtain a blood pressure and EKG.  During the exam, you will continue to be connected to the EKG and blood pressure machines.  A small, safe amount of a radioactive tracer will be injected  in your IV to obtain a series of pictures of your heart along with an injection of a stress agent.     After your Exam:   It is recommended that you eat a meal and drink a caffeinated beverage to counter act any effects of the stress agent.  Drink plenty of fluids for the remainder of the day and urinate frequently for the first couple of hours after the exam.  Your doctor will inform you of your test results within 7-10 business days.   For questions about your test or how to prepare for your test, please  call: Marchia Bond, Cardiac Imaging Nurse Navigator  Gordy Clement, Cardiac Imaging Nurse Navigator Office: [phone]

## 2022-07-17 NOTE — Telephone Encounter (Signed)
Left message to call Jayziah Bankhead, RN at [facility], [phone].  

## 2022-07-25 ENCOUNTER — Other Ambulatory Visit: Payer: Self-pay | Admitting: Unknown Physician Specialty

## 2022-07-25 DIAGNOSIS — R519 Headache, unspecified: Secondary | ICD-10-CM

## 2022-07-27 ENCOUNTER — Ambulatory Visit
Admission: RE | Admit: 2022-07-27 | Discharge: 2022-07-27 | Disposition: A | Discharge status: Home / Self Care | Payer: BC Managed Care – PPO | Source: Ambulatory Visit | Attending: Unknown Physician Specialty | Admitting: Unknown Physician Specialty

## 2022-07-27 DIAGNOSIS — R519 Headache, unspecified: Secondary | ICD-10-CM

## 2022-07-28 NOTE — Telephone Encounter (Signed)
Spoke with patient. Surgery date request confirmed.  Advised surgery is scheduled for 09/09/22, [facility] at 0830.  Surgery instruction sheet and hospital brochure reviewed, printed copy will be mailed.  Patient verbalizes understanding and is agreeable.   Routing to [employer] for benefits. Encounter may be closed once reviewed.

## 2022-08-07 NOTE — Telephone Encounter (Signed)
Spoke with patient regarding surgery benefits. Patient acknowledges understanding of information presented. Patient is aware that benefits presented are professional benefits only. Patient is aware the hospital will call with facility benefits. See account note.Front desk aware of PR

## 2022-08-11 ENCOUNTER — Ambulatory Visit: Payer: BC Managed Care – PPO | Admitting: Pulmonary Disease

## 2022-08-14 ENCOUNTER — Other Ambulatory Visit: Payer: Self-pay | Admitting: Family Medicine

## 2022-08-14 DIAGNOSIS — Z1231 Encounter for screening mammogram for malignant neoplasm of breast: Secondary | ICD-10-CM

## 2022-08-15 ENCOUNTER — Telehealth (HOSPITAL_COMMUNITY): Payer: Self-pay | Admitting: *Deleted

## 2022-08-15 NOTE — Telephone Encounter (Signed)
Reaching out to patient to offer assistance regarding upcoming cardiac imaging study; pt verbalizes understanding of appt date/time, parking situation and where to check in, pre-test NPO status, and verified current allergies; name and call back number provided for further questions should they arise  Kara Clement RN Navigator Cardiac Imaging [facility] [phone] office [phone] cell  Patient aware to avoid [place]mb[place] and nitro patch starting on Monday and caffeine 12 hours prior to her cardiac PET scan.

## 2022-08-19 ENCOUNTER — Encounter (HOSPITAL_COMMUNITY)
Admission: RE | Admit: 2022-08-19 | Discharge: 2022-08-19 | Disposition: A | Discharge status: Home / Self Care | Payer: BC Managed Care – PPO | Source: Ambulatory Visit | Attending: Cardiology | Admitting: Cardiology

## 2022-08-19 DIAGNOSIS — I208 Other forms of angina pectoris: Secondary | ICD-10-CM | POA: Diagnosis not present

## 2022-08-19 LAB — NM PET CT CARDIAC PERFUSION MULTI W/ABSOLUTE BLOODFLOW
LV dias vol: 92 mL (ref 46–106)
LV sys vol: 39 mL
MBFR: 3.32
Nuc Rest EF: 58 %
Nuc Stress EF: 70 %
Peak HR: 101 {beats}/min
Rest HR: 68 {beats}/min
Rest MBF: 0.97 ml/g/min
Rest Nuclear Isotope Dose: 19.7 mCi
Rest perfusion cavity size (mL): 92 mL
ST Depression (mm): 0 mm
Stress MBF: 3.22 ml/g/min
Stress Nuclear Isotope Dose: 19.5 mCi
Stress perfusion cavity size (mL): 106 mL
TID: 1

## 2022-08-19 MED ORDER — REGADENOSON 0.4 MG/5ML IV SOLN: 0.4000 mg | Freq: Once | INTRAVENOUS | Status: AC

## 2022-08-19 MED ORDER — RUBIDIUM RB82 GENERATOR (RUBYFILL)
25.0000 | PACK | Freq: Once | INTRAVENOUS | Status: AC
  Administered 2022-08-19: 19.5 via INTRAVENOUS

## 2022-08-19 MED ORDER — REGADENOSON 0.4 MG/5ML IV SOLN
INTRAVENOUS | Status: AC
  Administered 2022-08-19: 0.4 mg via INTRAVENOUS
  Filled 2022-08-19: qty 5

## 2022-08-19 MED ORDER — RUBIDIUM RB82 GENERATOR (RUBYFILL)
25.0000 | PACK | Freq: Once | INTRAVENOUS | Status: AC
  Administered 2022-08-19: 19.7 via INTRAVENOUS

## 2022-08-19 NOTE — Progress Notes (Signed)
Patient presents for a cardiac PET stress test and tolerated procedure without incident. Patient maintained acceptable vital signs throughout the test and was offered caffeine after test.  Patient ambulated out of department with a steady gait.  

## 2022-08-20 DIAGNOSIS — J40 Bronchitis, not specified as acute or chronic: Secondary | ICD-10-CM

## 2022-08-20 DIAGNOSIS — J670 Farmer's lung: Secondary | ICD-10-CM

## 2022-08-20 HISTORY — DX: Bronchitis, not specified as acute or chronic: J40

## 2022-08-20 HISTORY — DX: Farmer's lung: J67.0

## 2022-08-21 ENCOUNTER — Encounter (HOSPITAL_BASED_OUTPATIENT_CLINIC_OR_DEPARTMENT_OTHER): Payer: Self-pay | Admitting: Obstetrics & Gynecology

## 2022-08-21 ENCOUNTER — Telehealth: Payer: Self-pay | Admitting: *Deleted

## 2022-08-21 NOTE — Progress Notes (Addendum)
I spoke with Dr. Lanetta Inch, MDA on 08/21/22. Patient is scheduled for a hysterectomy at [facility] on 09/09/22. Per Dr. Lanetta Inch, patient should be moved to the main OR. Due to her recent bronchitis her surgery will need to be delayed 4 - 6 weeks. Patient will need cardiac and pulmonary clearance prior to surgery.  I called Glorianne Manchester, scheduler for Dr. Dellis Filbert on 08/20/22 and left a message with the above information.

## 2022-08-21 NOTE — Telephone Encounter (Signed)
Message received from [place], Claiborne Billings.  Per Dr. Lanetta Inch, Anesthesiologist, patient has recent Dx of bronchitis, surgery will need to be delayed 4-6 weeks. Patient will need cardiac and pulmonary clearance prior to surgery and scheduled in Main OR.   Spoke with patient. Advised as seen above.  Confirmed Pulmonologist Dr. Sanjuana Mae and Cardiologist, Dr.Pemberton. Patient was seen by Internal Med Dr. Ola Spurr for tx and symptoms of Bronchitis.  Patient also sees Rheumatology for tx of RA, will need to discuss injection schedule with rheumatology before scheduling.   Patient will contact rheumatology to review potential timeframe of next injection and return call with Main OR surgery dates once schedule reviewed. Questions answered.   Routing to Dr. Ileene Musa./

## 2022-08-22 ENCOUNTER — Telehealth: Payer: Self-pay | Admitting: *Deleted

## 2022-08-22 ENCOUNTER — Encounter: Payer: BC Managed Care – PPO | Admitting: Obstetrics & Gynecology

## 2022-08-22 ENCOUNTER — Other Ambulatory Visit: Payer: Self-pay | Admitting: Neurology

## 2022-08-22 DIAGNOSIS — Z8249 Family history of ischemic heart disease and other diseases of the circulatory system: Secondary | ICD-10-CM

## 2022-08-22 DIAGNOSIS — G43709 Chronic migraine without aura, not intractable, without status migrainosus: Secondary | ICD-10-CM

## 2022-08-22 NOTE — Telephone Encounter (Signed)
   Name: Kara Anderson  DOB: [birth year]  MRN: [id number]  Primary Cardiologist: Freada Bergeron, MD  Chart reviewed as part of pre-operative protocol coverage. Because of [name]'s past medical history and time since last visit, she will require a follow-up telephone visit in order to better assess preoperative cardiovascular risk.  Reviewed most recent cardiac PET which showed augmented blood flow and appropriate stress response.   Pre-op covering staff: - Please schedule appointment and call patient to inform them. If patient already had an upcoming appointment within acceptable timeframe, please add "pre-op clearance" to the appointment notes so provider is aware. - Please contact requesting surgeon's office via preferred method (i.e, phone, fax) to inform them of need for appointment prior to surgery.  With a diagnosis of microvascular angina and recent symptoms will route to Dr. Johney Frame for guidance on holding ASA x7 days prior to her robotic total laparoscopic hysterectomy with bilateral salpingectomy.  Elgie Collard, PA-C  08/22/2022, 11:39 AM

## 2022-08-22 NOTE — Telephone Encounter (Signed)
Okay to hold

## 2022-08-22 NOTE — Telephone Encounter (Signed)
Pt agreeable to plan of care for tele pre op appt 09/29/22 @ 10 am. Med rec and consent done. Pt states she is not him right now, but there a couple of other medications that they cannot think of the name of to add to her chart. I stated she can given them to the APP when we call her for the pre op appt.

## 2022-08-22 NOTE — Telephone Encounter (Signed)
   Pre-operative Risk Assessment    Patient Name: Kara Anderson  DOB: 1976/03/18 MRN: [id number]      Request for Surgical Clearance    Procedure:   ROBOTIC TOTAL LAPAROSCOPIC HYSTERECTOMY WITH B/L SALPINGECTOMY   Date of Surgery:  Clearance 10/08/22                                 Surgeon:  DR. Dellis Filbert Surgeon's Group or Practice Name:  [facility] Phone number:  [phone] Fax number:  [phone] ATTN: Glorianne Manchester, RN   Type of Clearance Requested:   - Medical ; ASA    Type of Anesthesia:   CHOICE   Additional requests/questions:    [name]   08/22/2022, 11:18 AM

## 2022-08-22 NOTE — Telephone Encounter (Signed)
Surgical clearance request faxed to:   Cardiology, Dr. Johney Frame at [phone]  Pulmonology, Dr. Sanjuana Mae at [phone]

## 2022-08-22 NOTE — Telephone Encounter (Signed)
Pt agreeable to plan of care for tele pre op appt 09/29/22 @ 10 am. Med rec and consent done. Pt states she is not him right now, but there a couple of other medications that they cannot think of the name of to add to her chart. I stated she can given them to the APP when we call her for the pre op appt.      Patient Consent for Virtual Visit        MICHI HERRMANN has provided verbal consent on 08/22/2022 for a virtual visit (video or telephone).   CONSENT FOR VIRTUAL VISIT FOR:  [place]  By participating in this virtual visit I agree to the following:  I hereby voluntarily request, consent and authorize [facility] and its employed or contracted physicians, physician assistants, nurse practitioners or other licensed health care professionals (the Practitioner), to provide me with telemedicine health care services (the "Services") as deemed necessary by the treating Practitioner. I acknowledge and consent to receive the Services by the Practitioner via telemedicine. I understand that the telemedicine visit will involve communicating with the Practitioner through live audiovisual communication technology and the disclosure of certain medical information by electronic transmission. I acknowledge that I have been given the opportunity to request an in-person assessment or other available alternative prior to the telemedicine visit and am voluntarily participating in the telemedicine visit.  I understand that I have the right to withhold or withdraw my consent to the use of telemedicine in the course of my care at any time, without affecting my right to future care or treatment, and that the Practitioner or I may terminate the telemedicine visit at any time. I understand that I have the right to inspect all information obtained and/or recorded in the course of the telemedicine visit and may receive copies of available information for a reasonable fee.  I understand that some of the  potential risks of receiving the Services via telemedicine include:  Delay or interruption in medical evaluation due to technological equipment failure or disruption; Information transmitted may not be sufficient (e.g. poor resolution of images) to allow for appropriate medical decision making by the Practitioner; and/or  In rare instances, security protocols could fail, causing a breach of personal health information.  Furthermore, I acknowledge that it is my responsibility to provide information about my medical history, conditions and care that is complete and accurate to the best of my ability. I acknowledge that Practitioner's advice, recommendations, and/or decision may be based on factors not within their control, such as incomplete or inaccurate data provided by me or distortions of diagnostic images or specimens that may result from electronic transmissions. I understand that the practice of medicine is not an exact science and that Practitioner makes no warranties or guarantees regarding treatment outcomes. I acknowledge that a copy of this consent can be made available to me via my patient portal ([place]), or I can request a printed copy by calling the office of [facility].    I understand that my insurance will be billed for this visit.   I have read or had this consent read to me. I understand the contents of this consent, which adequately explains the benefits and risks of the Services being provided via telemedicine.  I have been provided ample opportunity to ask questions regarding this consent and the Services and have had my questions answered to my satisfaction. I give my informed consent for the services to be  provided through the use of telemedicine in my medical care

## 2022-09-04 ENCOUNTER — Ambulatory Visit
Admission: RE | Admit: 2022-09-04 | Discharge: 2022-09-04 | Disposition: A | Discharge status: Home / Self Care | Payer: BC Managed Care – PPO | Source: Ambulatory Visit | Attending: Neurology | Admitting: Neurology

## 2022-09-04 DIAGNOSIS — G43709 Chronic migraine without aura, not intractable, without status migrainosus: Secondary | ICD-10-CM

## 2022-09-04 DIAGNOSIS — Z8249 Family history of ischemic heart disease and other diseases of the circulatory system: Secondary | ICD-10-CM

## 2022-09-09 DIAGNOSIS — Z01818 Encounter for other preprocedural examination: Secondary | ICD-10-CM

## 2022-09-10 NOTE — Telephone Encounter (Signed)
Per review of Epic, patient is scheduled for OV with pulmonology 10/4 and cardiology 10/16.    Spoke with patient. New pre-op and post-op appts scheduled. Confirmed surgery, [facility], 10/25 at 0830. Arrive at [employer]. Will mail updated Surgery Info form.  Question answered. Will f/u once pre-op clearance received.

## 2022-09-18 ENCOUNTER — Ambulatory Visit: Payer: BC Managed Care – PPO

## 2022-09-24 ENCOUNTER — Encounter: Payer: Self-pay | Admitting: Obstetrics & Gynecology

## 2022-09-24 ENCOUNTER — Ambulatory Visit (INDEPENDENT_AMBULATORY_CARE_PROVIDER_SITE_OTHER): Payer: BC Managed Care – PPO | Admitting: Obstetrics & Gynecology

## 2022-09-24 VITALS — BP 112/76 | HR 75 | Ht 63.25 in | Wt 168.0 lb

## 2022-09-24 DIAGNOSIS — D250 Submucous leiomyoma of uterus: Secondary | ICD-10-CM | POA: Diagnosis not present

## 2022-09-24 DIAGNOSIS — R102 Pelvic and perineal pain: Secondary | ICD-10-CM | POA: Diagnosis not present

## 2022-09-24 DIAGNOSIS — M314 Aortic arch syndrome [Takayasu]: Secondary | ICD-10-CM

## 2022-09-24 DIAGNOSIS — N809 Endometriosis, unspecified: Secondary | ICD-10-CM | POA: Diagnosis not present

## 2022-09-24 DIAGNOSIS — N921 Excessive and frequent menstruation with irregular cycle: Secondary | ICD-10-CM

## 2022-09-24 NOTE — Telephone Encounter (Signed)
Call placed to [facility] / Dr. Sanjuana Mae. Call to f/u on surgical clearance. Spoke with Zigmund Daniel, will have nurse return call or fax surgical clearance if completed.

## 2022-09-24 NOTE — Progress Notes (Signed)
    MASIEL GENTZLER 12-Sep-1976 [id number]        46 y.o.  G0P0000   RP: Preop XI Robotic TLH/BS at [facility] on 10/08/22  HPI: Menometrorrhagia improved with lighter bleeding on Progesterone 200 mg HS daily.  Sonohysto showed a partially SM Fibroid.  Patient declined conservative surgery and requested hysterectomy.  Scheduled for XI Robotic TLH/BS on 10/08/22.   OB History  Gravida Para Term Preterm AB Living  0 0 0 0 0 0  SAB IAB Ectopic Multiple Live Births  0 0 0 0    Obstetric Comments  1st Menstrual Cycle:  12    Past medical history,surgical history, problem list, medications, allergies, family history and social history were all reviewed and documented in the EPIC chart.   Directed ROS with pertinent positives and negatives documented in the history of present illness/assessment and plan.  Exam:  Vitals:   09/24/22 1033  BP: 112/76  Pulse: 75  SpO2: 99%  Weight: 168 lb (76.2 kg)  Height: 5' 3.25" (1.607 m)   General appearance:  Normal  [facility] Infusion Hysterogram on 04/10/22:   The initial transvaginal ultrasound demonstrated the following: T/V images.  Anteverted uterus normal in size with a thin symmetrical endometrium measured at 1.9 mm.  Both ovaries are normal.  Bilateral adnexa normal.  No free fluid.   Sonohysterogram performed: 0.6 x 0.6 cm partially intracavitary submucosal/intramural fibroid (total size of Fibroid) 1.5 cm).  About a third of the fibroid is intracavitary.  Gynecologic exam: Deferred   Assessment/Plan:  46 y.o. G0  1. Menometrorrhagia Menometrorrhagia improved with lighter bleeding on Progesterone 200 mg HS daily.  Sonohysto showed a partially SM Fibroid.  Patient declined conservative surgery and requested hysterectomy.  Scheduled for XI Robotic TLH/BS on 10/08/22 at [facility] main OR because of her serious cardio-pulmonary risks.  Surgery and risks thoroughly reviewed with patient.  Preop preparation/medications to hold/fasting discussed.  Postop  expectations and precautions reviewed.    2. Submucous uterine fibroid As above.  3. Pelvic pain in female As above.  4. H/O Endometriosis determined by laparoscopy Increased risk of adhesions.  5. 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  Need Cardio and Pneumo surgical clearance before surgery.  Other orders - amitriptyline (ELAVIL) 10 MG tablet; Take by mouth. Takes 3 at night - NURTEC 75 MG TBDP; Take 1 tablet by mouth daily as needed. - MAGNESIUM PO; Take 400 mg by mouth. At night - VITAMIN E PO; Take by mouth every other day. - Coenzyme Q10 (CO Q 10 PO); Take by mouth.                         Patient was counseled as to the risk of surgery to include the following:  1. Infection (prohylactic antibiotics will be administered)  2. DVT/Pulmonary Embolism (prophylactic pneumo compression stockings will be used)  3.Trauma to internal organs requiring additional surgical procedure to repair any injury to internal organs requiring perhaps additional hospitalization days.  4.Hemmorhage requiring transfusion and blood products which carry risks such as   anaphylactic reaction, hepatitis and AIDS  Patient had received literature information on the procedure scheduled and all her questions were answered and fully accepts all risk.   Princess Bruins MD, 11:10 AM 09/24/2022

## 2022-09-25 NOTE — Telephone Encounter (Signed)
Surgical clearance received from Pulmonology, Dr. Lanney Gins.

## 2022-09-29 ENCOUNTER — Ambulatory Visit: Payer: BC Managed Care – PPO | Attending: Cardiology | Admitting: Nurse Practitioner

## 2022-09-29 ENCOUNTER — Encounter: Payer: Self-pay | Admitting: Obstetrics & Gynecology

## 2022-09-29 ENCOUNTER — Ambulatory Visit
Admission: RE | Admit: 2022-09-29 | Discharge: 2022-09-29 | Disposition: A | Discharge status: Home / Self Care | Payer: BC Managed Care – PPO | Source: Ambulatory Visit | Attending: Family Medicine | Admitting: Family Medicine

## 2022-09-29 DIAGNOSIS — Z01810 Encounter for preprocedural cardiovascular examination: Secondary | ICD-10-CM

## 2022-09-29 DIAGNOSIS — Z1231 Encounter for screening mammogram for malignant neoplasm of breast: Secondary | ICD-10-CM

## 2022-09-29 NOTE — Telephone Encounter (Signed)
Cardiac clearance received. See note in Epic dated 09/29/22.   Routing to Dr. Ileene Musa.

## 2022-09-29 NOTE — Progress Notes (Signed)
Virtual Visit via Telephone Note   Because of Kara Anderson's co-morbid illnesses, she is at least at moderate risk for complications without adequate follow up.  This format is felt to be most appropriate for this patient at this time.  The patient did not have access to video technology/had technical difficulties with video requiring transitioning to audio format only (telephone).  All issues noted in this document were discussed and addressed.  No physical exam could be performed with this format.  Please refer to the patient's chart for her consent to telehealth for [facility].  Evaluation Performed:  Preoperative cardiovascular risk assessment _____________   Date:  09/29/2022   Patient ID:  Kara Anderson, DOB 1976-02-06, MRN [id number] Patient Location:  Home Provider location:   Office  Primary Care Provider:  Dion Body, MD Primary Cardiologist:  Freada Bergeron, MD  Chief Complaint / Patient Profile   46 y.o. y/o female with a h/o microvascular angina, pneumonitis and Farmer's lung, pulmonary artery stenosis s/p stenting in 08/2018, hyperlipidemia, and Takayasu arteriolitis (follows with rheumatology) who is pending robotic total laparoscopic hysterectomy with bilateral salpingectomy on 10/08/2022 with Dr. Dellis Filbert of [place] and presents today for telephonic preoperative cardiovascular risk assessment.  Past Medical History    Past Medical History:  Diagnosis Date   Allergy    Anxiety    Bronchitis 08/20/2022   micropurulent and recurrent, pt started on Augmentin 08/20/22, see OV w/ Dr. Adrian Prows on 08/20/22.   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   GERD (gastroesophageal reflux disease)    Heart murmur    HLD (hyperlipidemia) 09/29/2019   Hypersensitivity pneumonitis ([place])    As of 08/20/22, pt is being treated w/ Itraconazole. Pt follws with pulmonologist, Dr. Ottie Glazier @ [place], [place] 06/18/22. Pt had abnormal chest imaging w/ ground glass infiltrates.   Large vessel vasculitis ([place])    Follwed by Dr. Valeta Harms @ [facility], [place] 07/02/22. Pt is being treated with infusions of infliximab.   Malaise and fatigue 09/29/2019   Mediastinal adenopathy    Microvascular angina    microvascular dysfunction and chronic angina, follows w/ Dr. Johney Frame at [facility], LOV 06/27/22 as of 08/20/22, Patient is currently on Ranexa, Imdur, NTG patch and sublingual NTG prn., 05/29/22 Echo in [place] showed EF 65 - 70%, 08/19/22 NM PET CT Cardiac Perfusion in Epic showed no evidence of ischemia   Migraines 02/2022   Daily HA w/ constant pressure, bi-temporal pain, photophobia, phonophobia and nausea. 07/27/22 MRI brain showed a normal appearance. Pt started on Topamax and Nurtec on 07/29/22. See 07/29/22 OV w/ neurologist Dr. Lurena Nida @ [place].   Mononucleosis    Pulmonary nodules 12/13/2021   tiny 2 -3 mm nodules per 12/13/21 Chest CT in Epic done for persistent cough and night sweats   Seasonal allergies    Seronegative rheumatoid arthritis ([place])    Followed by Dr. Ula Lingo at [place], [place] 07/02/22. Pt receives infusions of Avs[place], last infusion 07/12/22 as of 08/21/22.   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 Ula Lingo at [place], [place] 07/02/22 in [place].   Thyroid disease    hyothyroidism   Viral meningitis    Vitamin D deficiency disease 09/29/2019   Past Surgical History:  Procedure Laterality Date   HIP SURGERY  12/16/2011   IR THORACENTESIS ASP PLEURAL SPACE W/IMG  GUIDE  11/20/2017   LAPAROSCOPIC ABDOMINAL EXPLORATION  2007,2009   endometriosis   MEDIASTINOTOMY CHAMBERLAIN MCNEIL    Left 11/16/2017   Procedure: LEFT ANTERIOR MEDIASTINOTOMY CHAMBERLAIN PROCEDURE;  Surgeon: [name], MD;  Location: [facility] OR;  Service: Thoracic;  Laterality: Left;   NOSE SURGERY  12/15/2005   sinus surgery - reconstructe turbinates and deviated septum   Pulmonary Artery Stent Left 10/2018    Allergies  Allergies  Allergen Reactions   Codeine Nausea Only    History of Present Illness    AEMILIA Anderson is a 46 y.o. female who presents via audio/video conferencing for a telehealth visit today.  Pt was last seen in cardiology clinic on 06/27/2022 by Laurann Montana, NP.  At that time RENA HUNKE was doing well.  She did note occasional angina despite Imdur. Cardiac PET stress test showed no evidence of ischemia, normal LV function. The patient is now pending procedure as outlined above. Since her last visit, she has been stable from a cardiac standpoint. She notes ongoing intermittent chest discomfort, however, this is unchanged since the time of her stress test.  She did note some intermittent dizziness with increase nitroglycerin patch dosing, however, this improved once her dose was decreased. She denies palpitations, dyspnea, pnd, orthopnea, n, v, dizziness, syncope, edema, weight gain, or early satiety. All other systems reviewed and are otherwise negative except as noted above.   Home Medications    Prior to Admission medications   Medication Sig Start Date End Date Taking? Authorizing Provider  albuterol (VENTOLIN HFA) 108 (90 Base) MCG/ACT inhaler Inhale into the lungs. 05/02/22   [provider]  amitriptyline (ELAVIL) 10 MG tablet Take by mouth. Takes 3 at night 09/17/22   [provider]  amLODipine (NORVASC) 2.5 MG tablet Take 1 tablet (2.5 mg total) by mouth daily. 03/31/22   Freada Bergeron, MD  aspirin 81 MG EC tablet Take by  mouth.    [provider]  celecoxib (CELEBREX) 200 MG capsule Take 200 mg by mouth 2 (two) times daily. 07/08/21   [provider]  Cholecalciferol (VITAMIN D-3) 125 MCG (5000 UT) TABS Take 5,000 Units by mouth every other day.    [provider]  Coenzyme Q10 (CO Q 10 PO) Take by mouth.    [provider]  Cyanocobalamin (B-12 PO) Take 3,000 mg by mouth daily.    [provider]  Fluticasone-Umeclidin-Vilant (TRELEGY ELLIPTA) 200-62.5-25 MCG/ACT AEPB Inhale 1 Inhalation into the lungs daily at 12 noon. 05/29/22   [provider]  gabapentin (NEURONTIN) 300 MG capsule '600mg'$  by mouth in the AM, '600mg'$  by mouth noon, '900mg'$  at bedtime    [provider]  inFLIXimab-axxq (AVSOLA) 100 MG injection Inject into the vein every 2 (two) months.    [provider]  isosorbide mononitrate (IMDUR) 120 MG 24 hr tablet Take 1 tablet (120 mg total) by mouth 2 (two) times daily. 11/15/21   Loel Dubonnet, NP  itraconazole (SPORANOX) 100 MG capsule Take 1 capsule by mouth daily. 06/19/22   [provider]  MAGNESIUM PO  Take 400 mg by mouth. At night    [provider]  Multiple Vitamins-Minerals (MULTIVITAMIN ADULT) CHEW Chew 2 tablets by mouth daily.    [provider]  nitroGLYCERIN (NITRODUR - DOSED IN MG/24 HR) 0.3 mg/hr patch Place 1 patch (0.3 mg total) onto the skin daily. 03/06/22   Freada Bergeron, MD  nitroGLYCERIN (NITROSTAT) 0.4 MG SL tablet Place 1 tablet (0.4 mg total) under the tongue every 5 (five) minutes as needed for chest pain. 06/27/22   Loel Dubonnet, NP  NURTEC 75 MG TBDP Take 1 tablet by mouth daily as needed. 08/15/22   [provider]  omeprazole (PRILOSEC) 40 MG capsule Take 40 mg by mouth daily.    [provider]  ondansetron (ZOFRAN-ODT) 8 MG disintegrating tablet Take 8 mg by mouth 3 (three) times daily. 04/15/22   [provider]  progesterone (PROMETRIUM) 100  MG capsule Take 2 capsules by mouth at bedtime. 11/14/21   [provider]  ranolazine (RANEXA) 500 MG 12 hr tablet Take 2 tablets (1,000 mg total) by mouth 2 (two) times daily. 05/30/22   Freada Bergeron, MD  VITAMIN E PO Take by mouth every other day.    [provider]    Physical Exam    Vital Signs: MARLOWE CINQUEMANI does not have vital signs available for review today.  Given telephonic nature of communication, physical exam is limited. AAOx3. NAD. Normal affect.  Speech and respirations are unlabored.  Accessory Clinical Findings    None  Assessment & Plan    1.  Preoperative Cardiovascular Risk Assessment:  According to the Revised Cardiac Risk Index (RCRI), her Perioperative Risk of Major Cardiac Event is (%): 0.9. Her Functional Capacity in METs is: 7.99 according to the [facility] Activity Status Index (DASI).Therefore, based on ACC/AHA guidelines, patient would be at acceptable risk for the planned procedure without further cardiovascular testing.  The patient was advised that if she develops new symptoms prior to surgery to contact our office to arrange for a follow-up visit, and she verbalized understanding.  Per Dr. Johney Frame, primary cardiologist, patient may hold aspirin for 5 to 7 days prior to procedure. Please resume aspirin as soon as possible postprocedure, at the discretion of the surgeon.  A copy of this note will be routed to requesting surgeon.  Time:   Today, I have spent 7 minutes with the patient with telehealth technology discussing medical history, symptoms, and management plan.     Lenna Sciara, NP  09/29/2022, 10:12 AM

## 2022-09-30 NOTE — Telephone Encounter (Signed)
Spoke with patient. Update provided. Advised surgical clearance received from Pulmonology (scanned in [place]) and cardiology. Advised patient our office will call if any additional recommendations from anesthesia. Patient verbalizes understanding and is agreeable.    Encounter closed.

## 2022-10-01 NOTE — Progress Notes (Signed)
Surgical Instructions    Your procedure is scheduled on Wednesday October 25th.  Report to [facility] Main Entrance "A" at 6:30 A.M., then check in with the Admitting office.  Call this number if you have problems the morning of surgery:  [phone]   If you have any questions prior to your surgery date call [phone]: Open Monday-Friday 8am-4pm If you experience any cold or flu symptoms such as cough, fever, chills, shortness of breath, etc. between now and your scheduled surgery, please notify us at the above number     Remember:  Do not eat or drink after midnight the night before your surgery     Take these medicines the morning of surgery with A SIP OF WATER: amLODipine (NORVASC) 2.5 MG tablet Fluticasone-Umeclidin-Vilant (TRELEGY ELLIPTA) 200-62.5-25 MCG/ACT AEPB - please bring with you to the hospital gabapentin (NEURONTIN) 300 MG capsule isosorbide mononitrate (IMDUR) 120 MG 24 hr tablet itraconazole (SPORANOX) 100 MG capsule omeprazole (PRILOSEC) 40 MG capsule ranolazine (RANEXA) 500 MG 12 hr tablet   IF NEEDED  albuterol (VENTOLIN HFA) 108 (90 Base) MCG/ACT inhaler - please bring with you to the hospital itraconazole (SPORANOX) 100 MG capsule NURTEC 75 MG TBDP ondansetron (ZOFRAN-ODT) 8 MG disintegrating tablet   Follow your surgeon's instructions on when to stop Aspirin.  If no instructions were given by your surgeon then you will need to call the office to get those instructions.    As of today, STOP taking any Celebrex, Aspirin (unless otherwise instructed by your surgeon) Aleve, Naproxen, Ibuprofen, Motrin, Advil, Goody's, BC's, all herbal medications, fish oil, and all vitamins.           Do not wear jewelry or makeup. Do not wear lotions, powders, perfumes or deodorant. Do not shave 48 hours prior to surgery.   Do not bring valuables to the hospital. Do not wear nail polish, gel polish, artificial nails, or any other type of covering on natural nails  (fingers and toes) If you have artificial nails or gel coating that need to be removed by a nail salon, please have this removed prior to surgery. Artificial nails or gel coating may interfere with anesthesia's ability to adequately monitor your vital signs.  [place] is not responsible for any belongings or valuables.    Do NOT Smoke (Tobacco/Vaping)  24 hours prior to your procedure  If you use a CPAP at night, you may bring your mask for your overnight stay.   Contacts, glasses, hearing aids, dentures or partials may not be worn into surgery, please bring cases for these belongings   For patients admitted to the hospital, discharge time will be determined by your treatment team.   Patients discharged the day of surgery will not be allowed to drive home, and someone needs to stay with them for 24 hours.   SURGICAL WAITING ROOM VISITATION Patients having surgery or a procedure may have no more than 2 support people in the waiting area - these visitors may rotate.   Children under the age of 74 must have an adult with them who is not the patient. If the patient needs to stay at the hospital during part of their recovery, the visitor guidelines for inpatient rooms apply. Pre-op nurse will coordinate an appropriate time for 1 support person to accompany patient in pre-op.  This support person may not rotate.   Please refer to [email or web address] for the visitor guidelines for Inpatients (after your surgery is over and you are in a regular room).  Special instructions:    Oral Hygiene is also important to reduce your risk of infection.  Remember - BRUSH YOUR TEETH THE MORNING OF SURGERY WITH YOUR REGULAR TOOTHPASTE   [place]- Preparing For Surgery  Before surgery, you can play an important role. Because skin is not sterile, your skin needs to be as free of germs as possible. You can reduce the number of germs on your skin by  washing with CHG (chlorahexidine gluconate) Soap before surgery.  CHG is an antiseptic cleaner which kills germs and bonds with the skin to continue killing germs even after washing.     Please do not use if you have an allergy to CHG or antibacterial soaps. If your skin becomes reddened/irritated stop using the CHG.  Do not shave (including legs and underarms) for at least 48 hours prior to first CHG shower. It is OK to shave your face.  Please follow these instructions carefully.     Shower the NIGHT BEFORE SURGERY and the MORNING OF SURGERY with CHG Soap.   If you chose to wash your hair, wash your hair first as usual with your normal shampoo. After you shampoo, rinse your hair and body thoroughly to remove the shampoo.  Then [employer] and genitals (private parts) with your normal soap and rinse thoroughly to remove soap.  After that Use CHG Soap as you would any other liquid soap. You can apply CHG directly to the skin and wash gently with a scrungie or a clean washcloth.   Apply the CHG Soap to your body ONLY FROM THE NECK DOWN.  Do not use on open wounds or open sores. Avoid contact with your eyes, ears, mouth and genitals (private parts). Wash Face and genitals (private parts)  with your normal soap.   Wash thoroughly, paying special attention to the area where your surgery will be performed.  Thoroughly rinse your body with warm water from the neck down.  DO NOT shower/wash with your normal soap after using and rinsing off the CHG Soap.  Pat yourself dry with a CLEAN TOWEL.  Wear CLEAN PAJAMAS to bed the night before surgery  Place CLEAN SHEETS on your bed the night before your surgery  DO NOT SLEEP WITH PETS.   Day of Surgery:  Take a shower with CHG soap. Wear Clean/Comfortable clothing the morning of surgery Do not apply any deodorants/lotions.   Remember to brush your teeth WITH YOUR REGULAR TOOTHPASTE.    If you received a COVID test during your pre-op visit, it  is requested that you wear a mask when out in public, stay away from anyone that may not be feeling well, and notify your surgeon if you develop symptoms. If you have been in contact with anyone that has tested positive in the last 10 days, please notify your surgeon.    Please read over the following fact sheets that you were given.

## 2022-10-02 ENCOUNTER — Encounter (HOSPITAL_COMMUNITY)
Admission: RE | Admit: 2022-10-02 | Discharge: 2022-10-02 | Disposition: A | Discharge status: Home / Self Care | Payer: BC Managed Care – PPO | Source: Ambulatory Visit | Attending: Obstetrics & Gynecology | Admitting: Obstetrics & Gynecology

## 2022-10-02 ENCOUNTER — Encounter (HOSPITAL_COMMUNITY): Payer: Self-pay

## 2022-10-02 ENCOUNTER — Other Ambulatory Visit: Payer: Self-pay

## 2022-10-02 VITALS — BP 104/67 | HR 92 | Temp 98.3°F | Resp 17 | Ht 64.0 in | Wt 167.2 lb

## 2022-10-02 DIAGNOSIS — Z01818 Encounter for other preprocedural examination: Secondary | ICD-10-CM

## 2022-10-02 DIAGNOSIS — I2510 Atherosclerotic heart disease of native coronary artery without angina pectoris: Secondary | ICD-10-CM | POA: Insufficient documentation

## 2022-10-02 DIAGNOSIS — Z01812 Encounter for preprocedural laboratory examination: Secondary | ICD-10-CM | POA: Insufficient documentation

## 2022-10-02 HISTORY — DX: Nausea with vomiting, unspecified: R11.2

## 2022-10-02 HISTORY — DX: Pneumonia, unspecified organism: J18.9

## 2022-10-02 HISTORY — DX: Other specified postprocedural states: Z98.890

## 2022-10-02 HISTORY — DX: Anemia, unspecified: D64.9

## 2022-10-02 LAB — BASIC METABOLIC PANEL
Anion gap: 9 (ref 5–15)
BUN: 8 mg/dL (ref 6–20)
CO2: 28 mmol/L (ref 22–32)
Calcium: 9.7 mg/dL (ref 8.9–10.3)
Chloride: 99 mmol/L (ref 98–111)
Creatinine, Ser: 0.8 mg/dL (ref 0.44–1.00)
GFR, Estimated: >60 mL/min (ref >60)
Glucose, Bld: 100 mg/dL — ABNORMAL HIGH (ref 70–99)
Potassium: 4.4 mmol/L (ref 3.5–5.1)
Sodium: 136 mmol/L (ref 135–145)

## 2022-10-02 LAB — CBC
HCT: 41.4 % (ref 36.0–46.0)
Hemoglobin: 14 g/dL (ref 12.0–15.0)
MCH: 33.9 pg (ref 26.0–34.0)
MCHC: 33.8 g/dL (ref 30.0–36.0)
MCV: 100.2 fL — ABNORMAL HIGH (ref 80.0–100.0)
Platelets: 304 10*3/uL (ref 150–400)
RBC: 4.13 MIL/uL (ref 3.87–5.11)
RDW: 11.8 % (ref 11.5–15.5)
WBC: 7.4 10*3/uL (ref 4.0–10.5)
nRBC: 0 % (ref 0.0–0.2)

## 2022-10-02 LAB — TYPE AND SCREEN
ABO/RH(D): O POS
Antibody Screen: NEGATIVE

## 2022-10-02 NOTE — Progress Notes (Signed)
Surgical Instructions    Your procedure is scheduled on Wednesday October 25th.  Report to [facility] Main Entrance "A" at 6:30 A.M., then check in with the Admitting office.  Call this number if you have problems the morning of surgery:  [phone]   If you have any questions prior to your surgery date call [phone]: Open Monday-Friday 8am-4pm If you experience any cold or flu symptoms such as cough, fever, chills, shortness of breath, etc. between now and your scheduled surgery, please notify us at the above number     Remember:  Do not eat or drink after midnight the night before your surgery     Take these medicines the morning of surgery with A SIP OF WATER: Fluticasone-Umeclidin-Vilant (TRELEGY ELLIPTA) 200-62.5-25 MCG/ACT AEPB - please bring with you to the hospital gabapentin (NEURONTIN) 300 MG capsule isosorbide mononitrate (IMDUR) 120 MG 24 hr tablet itraconazole (SPORANOX) 100 MG capsule omeprazole (PRILOSEC) 40 MG capsule ranolazine (RANEXA) 500 MG 12 hr tablet nitroGLYCERIN (NITRODUR - DOSED IN MG/24 HR) 0.3 mg/hr patch   IF NEEDED  albuterol (VENTOLIN HFA) 108 (90 Base) MCG/ACT inhaler - please bring with you to the hospital NURTEC 75 MG TBDP ondansetron (ZOFRAN-ODT) 8 MG disintegrating tablet   Follow your surgeon's instructions on when to stop Aspirin.  If no instructions were given by your surgeon then you will need to call the office to get those instructions.    As of today, STOP taking any Celebrex, Aspirin (unless otherwise instructed by your surgeon) Aleve, Naproxen, Ibuprofen, Motrin, Advil, Goody's, BC's, all herbal medications, fish oil, and all vitamins.           [facility] is not responsible for any belongings or valuables.    Do NOT Smoke (Tobacco/Vaping)  24 hours prior to your procedure  If you use a CPAP at night, you may bring your mask for your overnight stay.   Contacts, glasses, hearing aids, dentures or partials may not be worn  into surgery, please bring cases for these belongings   For patients admitted to the hospital, discharge time will be determined by your treatment team.   Patients discharged the day of surgery will not be allowed to drive home, and someone needs to stay with them for 24 hours.   SURGICAL WAITING ROOM VISITATION Patients having surgery or a procedure may have no more than 2 support people in the waiting area - these visitors may rotate.   Children under the age of 72 must have an adult with them who is not the patient. If the patient needs to stay at the hospital during part of their recovery, the visitor guidelines for inpatient rooms apply. Pre-op nurse will coordinate an appropriate time for 1 support person to accompany patient in pre-op.  This support person may not rotate.   Please refer to [email or web address] for the visitor guidelines for Inpatients (after your surgery is over and you are in a regular room).    Special instructions:    Oral Hygiene is also important to reduce your risk of infection.  Remember - BRUSH YOUR TEETH THE MORNING OF SURGERY WITH YOUR REGULAR TOOTHPASTE   [place]- Preparing For Surgery  Before surgery, you can play an important role. Because skin is not sterile, your skin needs to be as free of germs as possible. You can reduce the number of germs on your skin by washing with CHG (chlorahexidine gluconate) Soap before surgery.  CHG is an antiseptic cleaner which kills germs and  bonds with the skin to continue killing germs even after washing.     Please do not use if you have an allergy to CHG or antibacterial soaps. If your skin becomes reddened/irritated stop using the CHG.  Do not shave (including legs and underarms) for at least 48 hours prior to first CHG shower. It is OK to shave your face.  Please follow these instructions carefully.     Shower the NIGHT BEFORE SURGERY and the MORNING OF  SURGERY with CHG Soap.   If you chose to wash your hair, wash your hair first as usual with your normal shampoo. After you shampoo, rinse your hair and body thoroughly to remove the shampoo.  Then [employer] and genitals (private parts) with your normal soap and rinse thoroughly to remove soap.  After that Use CHG Soap as you would any other liquid soap. You can apply CHG directly to the skin and wash gently with a scrungie or a clean washcloth.   Apply the CHG Soap to your body ONLY FROM THE NECK DOWN.  Do not use on open wounds or open sores. Avoid contact with your eyes, ears, mouth and genitals (private parts). Wash Face and genitals (private parts)  with your normal soap.   Wash thoroughly, paying special attention to the area where your surgery will be performed.  Thoroughly rinse your body with warm water from the neck down.  DO NOT shower/wash with your normal soap after using and rinsing off the CHG Soap.  Pat yourself dry with a CLEAN TOWEL.  Wear CLEAN PAJAMAS to bed the night before surgery  Place CLEAN SHEETS on your bed the night before your surgery  DO NOT SLEEP WITH PETS.   Day of Surgery:  Take a shower with CHG soap. Wear Clean/Comfortable clothing the morning of surgery Do not wear jewelry or makeup. Do not wear lotions, powders, perfumes or deodorant. Do not shave 48 hours prior to surgery.   Do not bring valuables to the hospital. Do not wear nail polish, gel polish, artificial nails, or any other type of covering on natural nails (fingers and toes) If you have artificial nails or gel coating that need to be removed by a nail salon, please have this removed prior to surgery. Artificial nails or gel coating may interfere with anesthesia's ability to adequately monitor your vital signs. Remember to brush your teeth WITH YOUR REGULAR TOOTHPASTE.    If you received a COVID test during your pre-op visit, it is requested that you wear a mask when out in public, stay  away from anyone that may not be feeling well, and notify your surgeon if you develop symptoms. If you have been in contact with anyone that has tested positive in the last 10 days, please notify your surgeon.    Please read over the following fact sheets that you were given.

## 2022-10-02 NOTE — Progress Notes (Addendum)
PCP - Dion Body, MD Cardiologist - Freada Bergeron, MD- cardiac clearance in epic Pulmonologist- Dr. Sanjuana Mae- pt reports receiving pulmonary clearance for surgery- records requested.  Rheumatologist- Dr. Barbee Shropshire- pt reports MD is aware and has scheduled her next Avsola injection for November. Last injection September 2019.  Infectious Disease- Dr. Adrian Prows- Pt states that when she is off antifungal she will develop nonproductive cough. pt currently on antifungal and states this helps her feel better. Pt reports no new symptoms.   PPM/ICD - denies  Chest x-ray - 11/06/21 EKG - 06/27/22 Stress Test - 08/19/22 ECHO - 05/29/22 Cardiac Cath - denies  Sleep Study - denies  Aspirin Instructions: Follow your surgeon's instructions on when to stop Aspirin.  If no instructions were given by your surgeon then you will need to call the office to get those instructions.    ERAS Protcol - NPO order  COVID TEST- N/A  Pt reports that it is normal for her blood pressure to run "low" 90/50s, and at times 80/40. Pt reports this is normal for her. BP today 104/67.    Anesthesia review: Medical history, history of chronic chest pain. "Grabbing" chest pain 2/10 to left side of chest at PAT appointment. Pt states this is normal for her d/t her vasculitis with exertion and at rest at times. Pt states she has her nitroglycerin patch on, and has taken all of her normal medications today. Pt states she would not take extra nitroglycerin tablet unless pain radiates elsewhere or is at a higher number. Karoline Caldwell, PA notified, no new orders. Sodium 130 on last CMP from 09/17/22. Per Jeneen Rinks need to redraw today. Pt wears Nitro Patch daily, pt instructed at pre-op appointment to wear as normal on DOS.  Patient denies shortness of breath, fever, cough at PAT appointment   All instructions explained to the patient, with a verbal understanding of the material. Patient agrees to go over the instructions  while at home for a better understanding. The opportunity to ask questions was provided.

## 2022-10-05 ENCOUNTER — Other Ambulatory Visit (HOSPITAL_BASED_OUTPATIENT_CLINIC_OR_DEPARTMENT_OTHER): Payer: Self-pay | Admitting: Family

## 2022-10-05 DIAGNOSIS — I2089 Other forms of angina pectoris: Secondary | ICD-10-CM

## 2022-10-06 NOTE — Telephone Encounter (Signed)
Rx(s) sent to pharmacy electronically.  

## 2022-10-06 NOTE — Progress Notes (Signed)
TED hose ordered by MD for day of surgery. TED hose order did not state whether to use Knee or thigh high TED hose. Dr Dellis Filbert notified and per MD pt needs thigh high TED hose to be placed prior to procedure. Order updated per Dr Dellis Filbert.

## 2022-10-06 NOTE — Progress Notes (Signed)
Anesthesia Chart Review:  Follows with cardiology for history of microvascular angina, pulmonary artery stenosis s/p stenting 08/2018, HLD.  Cardiac PET scan 08/19/2022 was low risk, no evidence of ischemia or infarction.  Seen by Diona Browner, NP 09/29/2022 for preop evaluation.  Per note, "According to the Revised Cardiac Risk Index (RCRI), her Perioperative Risk of Major Cardiac Event is (%): 0.9. Her Functional Capacity in METs is: 7.99 according to the Duke Activity Status Index (DASI).Therefore, based on ACC/AHA guidelines, patient would be at acceptable risk for the planned procedure without further cardiovascular testing. The patient was advised that if she develops new symptoms prior to surgery to contact our office to arrange for a follow-up visit, and she verbalized understanding. Per Dr. Johney Frame, primary cardiologist, patient may hold aspirin for 5 to 7 days prior to procedure. 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  She is maintained on infliximab, Celebrex, gabapentin.  Follows with pulmonologist Dr. 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  When seen 06/18/2022 she was started on itraconazole 100 mg daily for 6 months.  Patient reports subjective improvement in respiratory symptoms since starting this medication.  She is also maintained on Trelegy and as needed albuterol.  She was last seen 09/17/2022 for preop evaluation.  Spirometry performed at that time was normal.  Per note, "pulmonary preoperative evaluation: Patient plan for hysterectomy with salpingectomy.  ARISCAT (Canet)  preoperative pulmonary risk index in adults-low risk: 1.6% pulmonary postoperative complication rate.  Arozullah respiratory failure index-low risk: 1.8% pulmonary postoperative complication rate.  Lyndel Safe calculator for postoperative respiratory failure-low risk: 1.07% probability of postoperative respiratory failure."  Preop labs reviewed, unremarkable.  EKG 06/27/2022: NSR.  Rate 77.  CT chest 05/02/2022 (Care Everywhere): IMPRESSION:   HETEROGENEOUS GROUNDGLASS ATTENUATION IN THE LUNGS AND TRACE  BILATERAL PLEURAL EFFUSIONS COULD INDICATE PULMONARY EDEMA OR AN  ATYPICAL INFECTIOUS/INFLAMMATORY PNEUMONITIS.   LEFT MAIN PULMONARY ARTERY STENT. NO EVIDENCE OF A PULMONARY  EMBOLUS.   Cardiac PET scan 08/19/2022:   LV perfusion is normal. There is no evidence of ischemia. There is no evidence of infarction.   Rest left ventricular function is normal. Rest EF: 58 %. Stress left ventricular function is normal. Stress EF: 70 %. End diastolic cavity size is normal. End systolic cavity size is normal.   Myocardial blood flow was computed to be 0.14m/g/min at rest and 3.268mg/min at stress. Global myocardial blood flow reserve was 3.32 and was normal.   Coronary calcium was absent on the attenuation correction CT images.   The study is normal. The study is low risk.  TTE 05/29/2022:  1. Global longitudinal strain is -23.6%. Left ventricular ejection  fraction, by estimation, is 65 to 70%. The left ventricle has normal  function. The left ventricle has no regional wall motion abnormalities.  Left ventricular diastolic parameters were  normal.   2. Right ventricular systolic function is normal. The right ventricular  size is normal.   3. The mitral valve is normal in structure. Trivial mitral valve  regurgitation.   4. The aortic valve is normal in structure. Aortic valve regurgitation is  not visualized.   5. The inferior vena cava is normal in size with  greater than 50%  respiratory  variability, suggesting right atrial pressure of 3 mmHg.   Comparison(s): The left ventricular function is unchanged. 01/19/20 EF  55-60%.      [facility]/Anesthesiology Phone [phone] 10/06/2022 10:52 AM

## 2022-10-06 NOTE — Anesthesia Preprocedure Evaluation (Signed)
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  Cardiac PET scan 08/19/2022 was low risk, no evidence of ischemia or infarction.  Seen by Diona Browner, NP 09/29/2022 for preop evaluation.  Per note, "According to the Revised Cardiac Risk Index (RCRI),herPerioperative Risk of Major Cardiac Event is (%): 0.9.HerFunctional Capacity in METs is: 7.99according to the Duke Activity Status Index (DASI).Therefore, based on ACC/AHA guidelines, patient would be at acceptable risk for the planned procedure without further cardiovascular testing. The patient was advised that ifshedevelops new symptoms prior to surgery to contact our office to arrange for a follow-up visit, and sheverbalized understanding. Per Dr. 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  She is maintained on infliximab, Celebrex, gabapentin.  Follows with pulmonologist Dr. 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  When seen 06/18/2022 she was started on itraconazole 100 mg daily for 6 months.  Patient reports  subjective improvement in respiratory symptoms since starting this medication.  She is also maintained on Trelegy and as needed albuterol.  She was last seen 09/17/2022 for preop evaluation.  Spirometry performed at that time was normal.  Per note, "pulmonary preoperative evaluation: Patient plan for hysterectomy with salpingectomy.  ARISCAT (Canet) preoperative pulmonary risk index in adults-low risk: 1.6% pulmonary postoperative complication rate.  Arozullah respiratory failure index-low risk: 1.8% pulmonary postoperative complication rate.  Lyndel Safe calculator for postoperative respiratory failure-low risk: 1.07% probability of postoperative respiratory failure."  Preop labs reviewed, unremarkable.  EKG 06/27/2022: NSR.  Rate 77.  CT chest 05/02/2022 (Care Everywhere): IMPRESSION:   HETEROGENEOUS GROUNDGLASS ATTENUATION IN THE LUNGS AND TRACE  BILATERAL PLEURAL EFFUSIONS COULD INDICATE PULMONARY EDEMA OR AN  ATYPICAL INFECTIOUS/INFLAMMATORY PNEUMONITIS.   LEFT MAIN PULMONARY ARTERY STENT. NO EVIDENCE OF A PULMONARY  EMBOLUS.   Cardiac PET scan 08/19/2022: . LV perfusion is normal. There is no evidence of ischemia. There is no evidence of infarction. Marland Kitchen Rest left ventricular function is normal. Rest EF: 58 %. Stress left ventricular function is normal. Stress EF: 70 %. End diastolic cavity size is normal. End systolic cavity size is normal. . Myocardial blood flow was computed to be 0.33m/g/min at rest and 3.286mg/min at stress. Global myocardial blood flow reserve was 3.32 and was normal. . Coronary calcium was absent on the attenuation correction CT images. . The study is normal. The study is low risk.  TTE 05/29/2022: 1. Global longitudinal strain is -23.6%. Left ventricular ejection  fraction, by estimation, is 65 to 70%. The left ventricle has normal  function. The left ventricle has no regional wall motion abnormalities.  Left ventricular diastolic parameters were  normal.  2.  Right ventricular systolic function is normal. The right ventricular  size is normal.  3. The mitral valve is normal in structure. Trivial mitral valve  regurgitation.  4. The aortic valve is normal in structure. Aortic valve regurgitation is  not visualized.  5. The inferior vena cava is normal in size with greater than 50%  respiratory variability, suggesting right atrial pressure of 3 mmHg.   Comparison(s): The left ventricular function is unchanged. 01/19/20 EF  55-60%.   )        Anesthesia Quick Evaluation

## 2022-10-08 ENCOUNTER — Encounter (HOSPITAL_COMMUNITY): Payer: Self-pay | Admitting: Obstetrics & Gynecology

## 2022-10-08 ENCOUNTER — Encounter (HOSPITAL_COMMUNITY)
Admission: RE | Disposition: A | Discharge status: Home / Self Care | Payer: Self-pay | Source: Home / Self Care | Attending: Obstetrics & Gynecology

## 2022-10-08 ENCOUNTER — Ambulatory Visit (HOSPITAL_COMMUNITY)
Admission: RE | Admit: 2022-10-08 | Discharge: 2022-10-09 | Disposition: A | Discharge status: Home / Self Care | Payer: BC Managed Care – PPO | Attending: Obstetrics & Gynecology | Admitting: Obstetrics & Gynecology

## 2022-10-08 ENCOUNTER — Other Ambulatory Visit: Payer: Self-pay

## 2022-10-08 ENCOUNTER — Ambulatory Visit (HOSPITAL_COMMUNITY): Payer: BC Managed Care – PPO | Admitting: Anesthesiology

## 2022-10-08 DIAGNOSIS — N921 Excessive and frequent menstruation with irregular cycle: Secondary | ICD-10-CM | POA: Diagnosis not present

## 2022-10-08 DIAGNOSIS — I2089 Other forms of angina pectoris: Secondary | ICD-10-CM | POA: Diagnosis not present

## 2022-10-08 DIAGNOSIS — F418 Other specified anxiety disorders: Secondary | ICD-10-CM | POA: Insufficient documentation

## 2022-10-08 DIAGNOSIS — E039 Hypothyroidism, unspecified: Secondary | ICD-10-CM | POA: Diagnosis not present

## 2022-10-08 DIAGNOSIS — M314 Aortic arch syndrome [Takayasu]: Secondary | ICD-10-CM | POA: Insufficient documentation

## 2022-10-08 DIAGNOSIS — D259 Leiomyoma of uterus, unspecified: Secondary | ICD-10-CM | POA: Diagnosis not present

## 2022-10-08 DIAGNOSIS — Z8742 Personal history of other diseases of the female genital tract: Secondary | ICD-10-CM | POA: Diagnosis not present

## 2022-10-08 DIAGNOSIS — Z9889 Other specified postprocedural states: Secondary | ICD-10-CM

## 2022-10-08 DIAGNOSIS — E785 Hyperlipidemia, unspecified: Secondary | ICD-10-CM | POA: Insufficient documentation

## 2022-10-08 DIAGNOSIS — Z01818 Encounter for other preprocedural examination: Secondary | ICD-10-CM

## 2022-10-08 HISTORY — DX: Other specified postprocedural states: Z98.890

## 2022-10-08 HISTORY — DX: Other forms of angina pectoris: I20.8

## 2022-10-08 HISTORY — DX: Other giant cell arteritis: M31.6

## 2022-10-08 HISTORY — DX: Hypersensitivity pneumonitis due to unspecified organic dust: J67.9

## 2022-10-08 HISTORY — DX: Other forms of angina pectoris: I20.89

## 2022-10-08 HISTORY — DX: Rheumatoid arthritis without rheumatoid factor, unspecified site: M06.00

## 2022-10-08 HISTORY — PX: ROBOTIC ASSISTED LAPAROSCOPIC HYSTERECTOMY AND SALPINGECTOMY: SHX6379

## 2022-10-08 LAB — POCT PREGNANCY, URINE: Preg Test, Ur: NEGATIVE

## 2022-10-08 LAB — CREATININE, SERUM
Creatinine, Ser: 0.71 mg/dL (ref 0.44–1.00)
GFR, Estimated: >60 mL/min (ref >60)

## 2022-10-08 LAB — CBC
HCT: 38.8 % (ref 36.0–46.0)
Hemoglobin: 13.3 g/dL (ref 12.0–15.0)
MCH: 33.8 pg (ref 26.0–34.0)
MCHC: 34.3 g/dL (ref 30.0–36.0)
MCV: 98.7 fL (ref 80.0–100.0)
Platelets: 281 10*3/uL (ref 150–400)
RBC: 3.93 MIL/uL (ref 3.87–5.11)
RDW: 11.7 % (ref 11.5–15.5)
WBC: 13.7 10*3/uL — ABNORMAL HIGH (ref 4.0–10.5)
nRBC: 0 % (ref 0.0–0.2)

## 2022-10-08 SURGERY — XI ROBOTIC ASSISTED LAPAROSCOPIC HYSTERECTOMY AND SALPINGECTOMY
Anesthesia: General | Site: Abdomen | Laterality: Bilateral

## 2022-10-08 MED ORDER — FENTANYL CITRATE (PF) 250 MCG/5ML IJ SOLN
INTRAMUSCULAR | Status: AC
  Filled 2022-10-08: qty 5

## 2022-10-08 MED ORDER — PROPOFOL 10 MG/ML IV BOLUS
INTRAVENOUS | Status: DC | PRN
  Administered 2022-10-08: 200 mg via INTRAVENOUS

## 2022-10-08 MED ORDER — ACETAMINOPHEN 10 MG/ML IV SOLN
INTRAVENOUS | Status: DC | PRN
  Administered 2022-10-08: 1000 mg via INTRAVENOUS

## 2022-10-08 MED ORDER — ARTIFICIAL TEARS OPHTHALMIC OINT
TOPICAL_OINTMENT | OPHTHALMIC | Status: AC
  Filled 2022-10-08: qty 3.5

## 2022-10-08 MED ORDER — ONDANSETRON HCL 4 MG/2ML IJ SOLN
INTRAMUSCULAR | Status: AC
  Filled 2022-10-08: qty 2

## 2022-10-08 MED ORDER — SCOPOLAMINE 1 MG/3DAYS TD PT72
MEDICATED_PATCH | TRANSDERMAL | Status: AC
  Filled 2022-10-08: qty 1

## 2022-10-08 MED ORDER — LIDOCAINE 2% (20 MG/ML) 5 ML SYRINGE
INTRAMUSCULAR | Status: AC
  Filled 2022-10-08: qty 5

## 2022-10-08 MED ORDER — ORAL CARE MOUTH RINSE: 15.0000 mL | Freq: Once | OROMUCOSAL | Status: AC

## 2022-10-08 MED ORDER — ROCURONIUM BROMIDE 10 MG/ML (PF) SYRINGE
PREFILLED_SYRINGE | INTRAVENOUS | Status: DC | PRN
  Administered 2022-10-08: 20 mg via INTRAVENOUS
  Administered 2022-10-08: 70 mg via INTRAVENOUS

## 2022-10-08 MED ORDER — DEXAMETHASONE SODIUM PHOSPHATE 10 MG/ML IJ SOLN
INTRAMUSCULAR | Status: AC
  Filled 2022-10-08: qty 1

## 2022-10-08 MED ORDER — SCOPOLAMINE 1 MG/3DAYS TD PT72
1.0000 | MEDICATED_PATCH | TRANSDERMAL | Status: DC
  Administered 2022-10-08: 1.5 mg via TRANSDERMAL

## 2022-10-08 MED ORDER — BUPIVACAINE HCL (PF) 0.25 % IJ SOLN
INTRAMUSCULAR | Status: DC | PRN
  Administered 2022-10-08: 17 mL

## 2022-10-08 MED ORDER — HYDROCODONE-ACETAMINOPHEN 5-325 MG PO TABS: 1.0000 | ORAL_TABLET | ORAL | Status: DC | PRN

## 2022-10-08 MED ORDER — BUPIVACAINE HCL (PF) 0.25 % IJ SOLN
INTRAMUSCULAR | Status: AC
  Filled 2022-10-08: qty 30

## 2022-10-08 MED ORDER — ENOXAPARIN SODIUM 40 MG/0.4ML IJ SOSY
PREFILLED_SYRINGE | INTRAMUSCULAR | Status: AC
  Filled 2022-10-08: qty 0.4

## 2022-10-08 MED ORDER — LACTATED RINGERS IV SOLN: INTRAVENOUS | Status: DC

## 2022-10-08 MED ORDER — OXYCODONE-ACETAMINOPHEN 5-325 MG PO TABS
2.0000 | ORAL_TABLET | ORAL | Status: DC | PRN
  Administered 2022-10-08 – 2022-10-09 (×3): 2 via ORAL
  Filled 2022-10-08 (×3): qty 2

## 2022-10-08 MED ORDER — ROCURONIUM BROMIDE 10 MG/ML (PF) SYRINGE
PREFILLED_SYRINGE | INTRAVENOUS | Status: AC
  Filled 2022-10-08: qty 10

## 2022-10-08 MED ORDER — DEXAMETHASONE SODIUM PHOSPHATE 10 MG/ML IJ SOLN
INTRAMUSCULAR | Status: DC | PRN
  Administered 2022-10-08: 10 mg via INTRAVENOUS

## 2022-10-08 MED ORDER — CHLORHEXIDINE GLUCONATE 0.12 % MT SOLN
15.0000 mL | Freq: Once | OROMUCOSAL | Status: AC
  Administered 2022-10-08: 15 mL via OROMUCOSAL
  Filled 2022-10-08: qty 15

## 2022-10-08 MED ORDER — PHENYLEPHRINE HCL-NACL 20-0.9 MG/250ML-% IV SOLN
INTRAVENOUS | Status: DC | PRN
  Administered 2022-10-08: 25 ug/min via INTRAVENOUS

## 2022-10-08 MED ORDER — ONDANSETRON HCL 4 MG/2ML IJ SOLN
INTRAMUSCULAR | Status: DC | PRN
  Administered 2022-10-08: 4 mg via INTRAVENOUS

## 2022-10-08 MED ORDER — MIDAZOLAM HCL 5 MG/5ML IJ SOLN
INTRAMUSCULAR | Status: DC | PRN
  Administered 2022-10-08: 2 mg via INTRAVENOUS

## 2022-10-08 MED ORDER — MIDAZOLAM HCL 2 MG/2ML IJ SOLN
INTRAMUSCULAR | Status: AC
  Filled 2022-10-08: qty 2

## 2022-10-08 MED ORDER — LIDOCAINE 2% (20 MG/ML) 5 ML SYRINGE
INTRAMUSCULAR | Status: DC | PRN
  Administered 2022-10-08: 100 mg via INTRAVENOUS

## 2022-10-08 MED ORDER — ACETAMINOPHEN 10 MG/ML IV SOLN
INTRAVENOUS | Status: AC
  Filled 2022-10-08: qty 100

## 2022-10-08 MED ORDER — FENTANYL CITRATE (PF) 250 MCG/5ML IJ SOLN
INTRAMUSCULAR | Status: DC | PRN
  Administered 2022-10-08: 25 ug via INTRAVENOUS
  Administered 2022-10-08: 100 ug via INTRAVENOUS

## 2022-10-08 MED ORDER — CEFAZOLIN SODIUM-DEXTROSE 2-4 GM/100ML-% IV SOLN
2.0000 g | INTRAVENOUS | Status: AC
  Administered 2022-10-08: 2 g via INTRAVENOUS
  Filled 2022-10-08: qty 100

## 2022-10-08 MED ORDER — POVIDONE-IODINE 10 % EX SWAB
2.0000 | Freq: Once | CUTANEOUS | Status: AC
  Administered 2022-10-08: 2 via TOPICAL

## 2022-10-08 MED ORDER — PROPOFOL 10 MG/ML IV BOLUS
INTRAVENOUS | Status: AC
  Filled 2022-10-08: qty 20

## 2022-10-08 MED ORDER — ACETAMINOPHEN 325 MG PO TABS: 650.0000 mg | ORAL_TABLET | ORAL | Status: DC | PRN

## 2022-10-08 MED ORDER — SUGAMMADEX SODIUM 200 MG/2ML IV SOLN
INTRAVENOUS | Status: DC | PRN
  Administered 2022-10-08: 200 mg via INTRAVENOUS

## 2022-10-08 MED ORDER — ENOXAPARIN SODIUM 40 MG/0.4ML IJ SOSY
40.0000 mg | PREFILLED_SYRINGE | Freq: Every day | INTRAMUSCULAR | Status: DC
  Administered 2022-10-08: 40 mg via SUBCUTANEOUS

## 2022-10-08 MED ORDER — ENOXAPARIN SODIUM 40 MG/0.4ML IJ SOSY: 40.0000 mg | PREFILLED_SYRINGE | INTRAMUSCULAR | Status: DC

## 2022-10-08 SURGICAL SUPPLY — 58 items
BAG COUNTER SPONGE SURGICOUNT (BAG) ×1 IMPLANT
CANISTER SUCT 3000ML PPV (MISCELLANEOUS) ×1 IMPLANT
COVER BACK TABLE 60X90IN (DRAPES) ×1 IMPLANT
COVER TIP SHEARS 8 DVNC (MISCELLANEOUS) ×1 IMPLANT
COVER TIP SHEARS 8MM DA VINCI (MISCELLANEOUS) ×1
DEFOGGER SCOPE WARMER CLEARIFY (MISCELLANEOUS) ×1 IMPLANT
DERMABOND ADVANCED .7 DNX12 (GAUZE/BANDAGES/DRESSINGS) ×1 IMPLANT
DERMABOND ADVANCED .7 DNX6 (GAUZE/BANDAGES/DRESSINGS) IMPLANT
DILATOR CANAL MILEX (MISCELLANEOUS) IMPLANT
DRAPE ARM DVNC X/XI (DISPOSABLE) ×4 IMPLANT
DRAPE COLUMN DVNC XI (DISPOSABLE) ×1 IMPLANT
DRAPE DA VINCI XI ARM (DISPOSABLE) ×4
DRAPE DA VINCI XI COLUMN (DISPOSABLE) ×1
DRAPE UTILITY XL STRL (DRAPES) ×1 IMPLANT
DURAPREP 26ML APPLICATOR (WOUND CARE) ×1 IMPLANT
ELECT REM PT RETURN 9FT ADLT (ELECTROSURGICAL) ×1
ELECTRODE REM PT RTRN 9FT ADLT (ELECTROSURGICAL) ×1 IMPLANT
GAUZE PETROLATUM 1 X8 (GAUZE/BANDAGES/DRESSINGS) ×1 IMPLANT
GAUZE SPONGE 4X4 12PLY STRL LF (GAUZE/BANDAGES/DRESSINGS) IMPLANT
GLOVE BIOGEL PI IND STRL 7.0 (GLOVE) ×5 IMPLANT
GLOVE SURG ENC MOIS LTX SZ6.5 (GLOVE) ×3 IMPLANT
GLOVE SURG SS PI 7.0 STRL IVOR (GLOVE) IMPLANT
GOWN STRL REUS W/ TWL XL LVL3 (GOWN DISPOSABLE) IMPLANT
GOWN STRL REUS W/TWL XL LVL3 (GOWN DISPOSABLE) ×3
HIBICLENS CHG 4% 4OZ BTL (MISCELLANEOUS) ×1 IMPLANT
IRRIGATION STRYKERFLOW (MISCELLANEOUS) ×1 IMPLANT
IRRIGATOR STRYKERFLOW (MISCELLANEOUS) ×1
LEGGING LITHOTOMY PAIR STRL (DRAPES) ×1 IMPLANT
OBTURATOR OPTICAL STANDARD 8MM (TROCAR) ×1
OBTURATOR OPTICAL STND 8 DVNC (TROCAR) ×1
OBTURATOR OPTICALSTD 8 DVNC (TROCAR) ×1 IMPLANT
OCCLUDER COLPOPNEUMO (BALLOONS) ×1 IMPLANT
PACK ROBOT WH (CUSTOM PROCEDURE TRAY) ×1 IMPLANT
PACK ROBOTIC GOWN (GOWN DISPOSABLE) ×1 IMPLANT
PACK TRENDGUARD 450 HYBRID PRO (MISCELLANEOUS) ×1 IMPLANT
PAD OB MATERNITY 4.3X12.25 (PERSONAL CARE ITEMS) ×1 IMPLANT
PROTECTOR NERVE ULNAR (MISCELLANEOUS) ×2 IMPLANT
SEAL CANN UNIV 5-8 DVNC XI (MISCELLANEOUS) ×4 IMPLANT
SEAL XI 5MM-8MM UNIVERSAL (MISCELLANEOUS) ×4
SET TRI-LUMEN FLTR TB AIRSEAL (TUBING) ×1 IMPLANT
SPIKE FLUID TRANSFER (MISCELLANEOUS) ×2 IMPLANT
SUT VIC AB 4-0 PS2 27 (SUTURE) ×3 IMPLANT
SUT VICRYL 0 UR6 27IN ABS (SUTURE) ×1 IMPLANT
SUT VLOC 180 0 9IN  GS21 (SUTURE) ×1
SUT VLOC 180 0 9IN GS21 (SUTURE) ×1 IMPLANT
SUT VLOC 180 2-0 6IN GS21 (SUTURE) IMPLANT
SYR 50ML LL SCALE MARK (SYRINGE) IMPLANT
TIP RUMI ORANGE 6.7MMX12CM (TIP) IMPLANT
TIP UTERINE 5.1X6CM LAV DISP (MISCELLANEOUS) IMPLANT
TIP UTERINE 6.7X10CM GRN DISP (MISCELLANEOUS) IMPLANT
TIP UTERINE 6.7X6CM WHT DISP (MISCELLANEOUS) IMPLANT
TIP UTERINE 6.7X8CM BLUE DISP (MISCELLANEOUS) IMPLANT
TOWEL GREEN STERILE (TOWEL DISPOSABLE) ×1 IMPLANT
TRAY FOLEY MTR SLVR 14FR STAT (SET/KITS/TRAYS/PACK) IMPLANT
TRENDGUARD 450 HYBRID PRO PACK (MISCELLANEOUS) ×1
TROCAR PORT AIRSEAL 5X120 (TROCAR) ×1 IMPLANT
UNDERPAD 30X36 HEAVY ABSORB (UNDERPADS AND DIAPERS) ×1 IMPLANT
WATER STERILE IRR 1000ML POUR (IV SOLUTION) ×1 IMPLANT

## 2022-10-08 NOTE — Anesthesia Procedure Notes (Signed)
Procedure Name: Intubation Date/Time: 10/08/2022 8:48 AM  Performed by: Kyung Rudd, CRNAPre-anesthesia Checklist: Patient identified, Emergency Drugs available, Suction available and Patient being monitored Patient Re-evaluated:Patient Re-evaluated prior to induction Oxygen Delivery Method: Circle system utilized Preoxygenation: Pre-oxygenation with 100% oxygen Induction Type: IV induction Laryngoscope Size: Mac and 3 Grade View: Grade I Tube type: Oral Tube size: 7.0 mm Number of attempts: 1 Airway Equipment and Method: Stylet Placement Confirmation: ETT inserted through vocal cords under direct vision, positive ETCO2 and breath sounds checked- equal and bilateral Secured at: 21 cm Tube secured with: Tape Dental Injury: Teeth and Oropharynx as per pre-operative assessment

## 2022-10-08 NOTE — Transfer of Care (Signed)
Immediate Anesthesia Transfer of Care Note  Patient: Kara Anderson  Procedure(s) Performed: XI ROBOTIC ASSISTED LAPAROSCOPIC TOTAL HYSTERECTOMY AND BILATERAL SALPINGECTOMY (Bilateral: Abdomen)  Patient Location: PACU  Anesthesia Type:General  Level of Consciousness: awake, alert , and oriented  Airway & Oxygen Therapy: Patient Spontanous Breathing  Post-op Assessment: Report given to RN, Post -op Vital signs reviewed and stable, and Patient moving all extremities X 4  Post vital signs: Reviewed and stable  Last Vitals:  Vitals Value Taken Time  BP 104/40 10/08/22 1058  Temp    Pulse 73 10/08/22 1102  Resp 13 10/08/22 1102  SpO2 97 % 10/08/22 1102  Vitals shown include unvalidated device data.  Last Pain:  Vitals:   10/08/22 0738  TempSrc:   PainSc: 0-No pain         Complications: No notable events documented.

## 2022-10-08 NOTE — H&P (Signed)
Kara Anderson is an 46 y.o. female. G0P0000    RP: XI Robotic TLH/BS at [facility] on 10/08/22   HPI: Menometrorrhagia improved with lighter bleeding on Progesterone 200 mg HS daily. Sonohysto showed a partially SM Fibroid.  Patient declined conservative surgery and requested hysterectomy.  Scheduled for XI Robotic TLH/BS on 10/08/22.  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 Cardio and Pneumo surgical clearance obtained.   Pertinent Gynecological History: Menses:  Menometrorrhagia Contraception: abstinence Blood transfusions: none Sexually transmitted diseases: no past history Last mammogram: normal  Last pap: normal  OB History: G0  Menstrual History: Patient's last menstrual period was 10/01/2022 (approximate).    Past Medical History:  Diagnosis Date   Allergy    Anemia    d/t medications per patient   Anxiety    Bronchitis 08/20/2022   micropurulent and recurrent, pt started on Augmentin 08/20/22, see OV w/ Dr. Adrian Prows on 08/20/22.   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   GERD (gastroesophageal reflux disease)    Heart murmur    pt reports she no longer has this d/t control of her vasculitis   HLD (hyperlipidemia) 09/29/2019   Hypersensitivity pneumonitis ([place])    As of 08/20/22, pt is being treated w/ Itraconazole. Pt follws with pulmonologist, Dr. Ottie Glazier @ [facility], [place] 06/18/22. Pt had abnormal chest imaging w/ ground glass infiltrates.   Large vessel vasculitis ([place])    Follwed by Dr. Valeta Harms @ [facility], [place] 07/02/22. Pt is being treated with infusions of  infliximab.   Malaise and fatigue 09/29/2019   Mediastinal adenopathy    Microvascular angina    microvascular dysfunction and chronic angina, follows w/ Dr. Johney Frame at [facility], LOV 06/27/22 as of 08/20/22, Patient is currently on Ranexa, Imdur, NTG patch and sublingual NTG prn., 05/29/22 Echo in [place] showed EF 65 - 70%, 08/19/22 NM PET CT Cardiac Perfusion in Epic showed no evidence of ischemia   Migraines 02/2022   Daily HA w/ constant pressure, bi-temporal pain, photophobia, phonophobia and nausea. 07/27/22 MRI brain showed a normal appearance. Pt started on Topamax and Nurtec on 07/29/22. See 07/29/22 OV w/ neurologist Dr. Lurena Nida @ [place].   Mononucleosis    Pneumonia    PONV (postoperative nausea and vomiting)    Pulmonary nodules 12/13/2021   tiny 2 -3 mm nodules per 12/13/21 Chest CT in Epic done for persistent cough and night sweats   Seasonal allergies    Seronegative rheumatoid arthritis ([place])    Followed by Dr. Ula Lingo at [place], [place] 07/02/22. Pt receives infusions of Avsola, last infusion 07/12/22 as of 08/21/22.   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 Ula Lingo at [place], [place] 07/02/22 in [place].   Thyroid disease    hyothyroidism   Viral meningitis    Vitamin D deficiency disease 09/29/2019    Past Surgical History:  Procedure Laterality Date   HIP SURGERY Right 12/16/2011   IR  THORACENTESIS ASP PLEURAL SPACE W/IMG GUIDE  11/20/2017   LAPAROSCOPIC ABDOMINAL EXPLORATION  2007,2009   endometriosis   MEDIASTINOTOMY CHAMBERLAIN MCNEIL    Left 11/16/2017   Procedure: LEFT ANTERIOR MEDIASTINOTOMY CHAMBERLAIN PROCEDURE;  Surgeon: [name], MD;  Location: [facility] OR;  Service: Thoracic;  Laterality: Left;   NOSE SURGERY  12/15/2005   sinus surgery - reconstructe turbinates and deviated septum    Pulmonary Artery Stent Left 10/2018    Family History  Problem Relation Age of Onset   Heart disease Mother    Stroke Mother    Diabetes Mother    Brain cancer Mother 69       glioblastoma   Heart disease Father    Alcohol abuse Father    Alcoholism Father    Other Sister        FAP   Colonic polyp Sister 64       adenomatous polyp   Breast cancer Maternal Aunt    Lung cancer Paternal Uncle 31   Other Paternal Uncle 61       MVA   Pancreatic disease Maternal Grandmother    Stomach cancer Maternal Grandmother    Colon cancer Maternal Grandmother    Heart attack Paternal Grandfather    Leukemia Cousin 12   Breast cancer Cousin 38       neg GT   Esophageal cancer Neg Hx    Liver disease Neg Hx    Rectal cancer Neg Hx     Social History:  reports that she has never smoked. She has never been exposed to tobacco smoke. She has never used smokeless tobacco. She reports that she does not drink alcohol and does not use drugs.  Allergies:  Allergies  Allergen Reactions   Codeine Nausea Only    Medications Prior to Admission  Medication Sig Dispense Refill Last Dose   acidophilus (RISAQUAD) CAPS capsule Take 1 capsule by mouth daily.   Past Week   albuterol (VENTOLIN HFA) 108 (90 Base) MCG/ACT inhaler Inhale 1-2 puffs into the lungs every 6 (six) hours as needed for shortness of breath or wheezing.   Past Month   amitriptyline (ELAVIL) 10 MG tablet Take 30 mg by mouth at bedtime.   10/07/2022   amLODipine (NORVASC) 2.5 MG tablet Take 1 tablet (2.5 mg total) by mouth daily. 90 tablet 3 10/07/2022   aspirin 81 MG EC tablet Take 81 mg by mouth daily.   Past Month   celecoxib (CELEBREX) 200 MG capsule Take 200 mg by mouth 2 (two) times daily.   Past Month   Cholecalciferol (VITAMIN D-3) 125 MCG (5000 UT) TABS Take 5,000 Units by mouth every other day.   Past Week   Coenzyme Q10 (CO Q 10 PO) Take 250 mg by mouth at bedtime.   Past Week   Cyanocobalamin (B-12 PO) Take 3,000 mg by  mouth daily.   Past Week   Fluticasone-Umeclidin-Vilant (TRELEGY ELLIPTA) 200-62.5-25 MCG/ACT AEPB Inhale 1 Inhalation into the lungs daily.   10/08/2022 at 0500   gabapentin (NEURONTIN) 300 MG capsule Take 600-900 mg by mouth See admin instructions. Take 600 mg by mouth in the morning, 600 mg at noon and 900 mg at bedtime   10/07/2022   inFLIXimab-axxq (AVSOLA) 100 MG injection Inject 100 mg into the vein every 2 (two) months.   Past Month   isosorbide mononitrate (IMDUR) 120 MG 24 hr tablet Take 1 tablet (120 mg total) by mouth 2 (two) times daily. 180 tablet 3 10/07/2022  itraconazole (SPORANOX) 100 MG capsule Take 100 mg by mouth daily.   10/07/2022   MAGNESIUM PO Take 400 mg by mouth at bedtime.   Past Month   Multiple Vitamins-Minerals (MULTIVITAMIN ADULT) CHEW Chew 2 tablets by mouth daily.   Past Month   nitroGLYCERIN (NITRODUR - DOSED IN MG/24 HR) 0.3 mg/hr patch Place 1 patch (0.3 mg total) onto the skin daily. 30 patch 12 10/08/2022   nitroGLYCERIN (NITROSTAT) 0.4 MG SL tablet PLACE 1 TABLET UNDER TONGUE EVERY 5 MINUTES AS NEEDED FOR CHEST PAIN 25 tablet 1 Past Month   NURTEC 75 MG TBDP Take 75 mg by mouth daily as needed (migraine).   Past Month   omeprazole (PRILOSEC) 40 MG capsule Take 40 mg by mouth daily.   10/07/2022   progesterone (PROMETRIUM) 100 MG capsule Take 100 mg by mouth at bedtime.   10/06/2022   ranolazine (RANEXA) 500 MG 12 hr tablet Take 2 tablets (1,000 mg total) by mouth 2 (two) times daily. 360 tablet 2 10/07/2022 at 1800   VITAMIN E PO Take 1 capsule by mouth every other day.   Past Month   ondansetron (ZOFRAN-ODT) 8 MG disintegrating tablet Take 8 mg by mouth every 8 (eight) hours as needed for nausea or vomiting.   More than a month    REVIEW OF SYSTEMS: A ROS was performed and pertinent positives and negatives are included in the history. GENERAL: No fevers or chills. HEENT: No change in vision, no earache, sore throat or sinus congestion. NECK: No pain or  stiffness. CARDIOVASCULAR: No chest pain or pressure. No palpitations. PULMONARY: No shortness of breath, cough or wheeze. GASTROINTESTINAL: No abdominal pain, nausea, vomiting or diarrhea, melena or bright red blood per rectum. GENITOURINARY: No urinary frequency, urgency, hesitancy or dysuria. MUSCULOSKELETAL: No joint or muscle pain, no back pain, no recent trauma. DERMATOLOGIC: No rash, no itching, no lesions. ENDOCRINE: No polyuria, polydipsia, no heat or cold intolerance. No recent change in weight. HEMATOLOGICAL: No anemia or easy bruising or bleeding. NEUROLOGIC: No headache, seizures, numbness, tingling or weakness. PSYCHIATRIC: No depression, no loss of interest in normal activity or change in sleep pattern.     Blood pressure 107/73, pulse 71, temperature (!) 97.5 F (36.4 C), temperature source Oral, resp. rate 18, height '5\' 4"'$  (1.626 m), weight 75.8 kg, last menstrual period 10/01/2022, SpO2 99 %.  Physical Exam:   Results for orders placed or performed during the hospital encounter of 10/08/22 (from the past 24 hour(s))  Pregnancy, urine POC     Status: None   Collection Time: 10/08/22  7:03 AM  Result Value Ref Range   Preg Test, Ur NEGATIVE NEGATIVE   Sono Infusion Hysterogram on 04/10/22:   The initial transvaginal ultrasound demonstrated the following: T/V images.  Anteverted uterus normal in size with a thin symmetrical endometrium measured at 1.9 mm.  Both ovaries are normal.  Bilateral adnexa normal.  No free fluid.   Sonohysterogram performed: 0.6 x 0.6 cm partially intracavitary submucosal/intramural fibroid (total size of Fibroid) 1.5 cm).  About a third of the fibroid is intracavitary.   Gynecologic exam: Deferred     Assessment/Plan:  46 y.o. G0   1. Menometrorrhagia Menometrorrhagia improved with lighter bleeding on Progesterone 200 mg HS daily. Sonohysto showed a partially SM Fibroid.  Patient declined conservative surgery and requested hysterectomy.  Scheduled  for XI Robotic TLH/BS on 10/08/22 at [facility] main OR because of her serious cardio-pulmonary risks.  Surgery and risks thoroughly reviewed with patient.  Preop preparation/medications to hold/fasting discussed.  Postop expectations and precautions reviewed.     2. Submucous uterine fibroid As above.   3. Pelvic pain in female As above.   4. H/O Endometriosis determined by laparoscopy Increased risk of adhesions.   5. 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  Need Cardio and Pneumo surgical clearance before surgery. Will protect patient with Lovenox prophylaxis.   Other orders - amitriptyline (ELAVIL) 10 MG tablet; Take by mouth. Takes 3 at night - NURTEC 75 MG TBDP; Take 1 tablet by mouth daily as needed. - MAGNESIUM PO; Take 400 mg by mouth. At night - VITAMIN E PO; Take by mouth every other day. - Coenzyme Q10 (CO Q 10 PO); Take by mouth.                          Patient was counseled as to the risk of surgery to include the following:   1. Infection (prohylactic antibiotics will be administered)   2. DVT/Pulmonary Embolism (prophylactic pneumo compression stockings will be used)   3.Trauma to internal organs requiring additional surgical procedure to repair any injury to internal organs requiring perhaps additional hospitalization days.   4.Hemmorhage requiring transfusion and blood products which carry risks such as   anaphylactic reaction, hepatitis and AIDS   Patient had received literature information on the procedure scheduled and all her questions were answered and fully accepts all risk.   Marie-Lyne Knight [place] 10/08/2022, 7:45 AM

## 2022-10-08 NOTE — Op Note (Signed)
Operative Note  10/08/2022  11:22 AM  PATIENT:  Kara Anderson  46 y.o. female  PRE-OPERATIVE DIAGNOSIS:  Menometrorrhagia, fibroids, hx of mild endometriosis  POST-OPERATIVE DIAGNOSIS:  Menometrorrhagia, fibroids, hx of mild endometriosis  PROCEDURE:  Procedure(s): XI ROBOTIC ASSISTED LAPAROSCOPIC TOTAL HYSTERECTOMY AND BILATERAL SALPINGECTOMY  SURGEON:  Surgeon(s): Princess Bruins, MD  ANESTHESIA:   general  FINDINGS: Uterus normal in size and appearance (Submucosal Fibroid not visible), normal tubes.  Normal ovaries.  DESCRIPTION OF OPERATION: Under general anesthesia with endotracheal intubation, the patient is in lithotomy position.  She is prepped with DuraPrep on the abdomen and Betadine on the suprapubic, vulvar and vaginal areas.  She is draped as usual.  Timeout is done.  Prophylaxis with Lovenox and Ancef 2 g IV was given.  A Foley catheter was inserted.  The vaginal exam revealed an anteverted uterus, normal volume and mobile.  No adnexal mass.  The weighted speculum was inserted in the vagina and the anterior lip of the cervix was grasped with a tenaculum.  Dilation was done with [facility] dilators.  The hysterometry was at 7 cm.  A #6 Rumi with the medium Koh ring were put in place at this cervix and uterus.  The other instruments were removed.  We went to the abdomen.      The supraumbilical area was infiltrated with Marcaine one quarter plain.  A 2 cm incision was done with a scalpel.  The aponeurosis was grasped with cokers.  The aponeurosis was opened with Mayo scissors under direct vision.  The parietal peritoneum was opened bluntly with the finger.  A pursestring stitch of Vicryl 0 was done on the aponeurosis.  The Sheryle Hail was inserted at that level under direct vision.  Up pneumoperitoneum was created with CO2.  Inspection revealed no adhesions with the anterior wall of the abdomen where the ports will be entered.  The uterus was small with normal appearance.  Normal  bilateral tubes.  Normal ovaries bilaterally.  Small incisions were made with a scalpel for port placements after infiltrating with Marcaine one quarter plain.  2 robotic ports were inserted in line with the umbilicus under direct vision on the right.  1 robotic port was inserted in line with the umbilicus distally under direct vision on the left.  The 8 mm assistant port was inserted under direct vision on the left medially.  The patient was positioned in 30 degree Trendelenburg.  She tolerated that position very well.  The robot was docked.  Targeting was done.  All arms were docked and robotic instruments were inserted under direct vision with the fenestrated clamp in the fourth arm, the scissors in the third arm, the camera in the second arm and the long bipolar in the first arm.  We went to the console.      Inspection of the pelvic cavity confirmed a normal uterus with bilateral tubes and normal bilateral ovaries.  Both ureters were seen in normal anatomy position with good peristalsis.  We started on the right side with cauterization and section of the right mesosalpinx.  We then cauterized and sectioned the right utero-ovarian ligament.  We cauterized and sectioned the right round ligament.  We then opened the broad ligament close to the uterus.  The anterior visceral peritoneum was opened to the midline and we started lowering the bladder.  We proceeded exactly the same way on the left side.  The visceral peritoneum was completely opened anteriorly.  The bladder was lowered past the [facility]  ring.  We then cauterized the backflow of the uterine artery bilaterally.  We cauterized the right uterine artery close to the uterus above the [facility] ring.  We cauterized the left uterine artery at the same level.  We then sectioned the right and left uterine arteries.  The occluder was inflated vaginally.  We used the tip of the scissors to open the superior aspect of the vagina on top of the Koh ring anteriorly, bilaterally  and posteriorly.  The uterus with cervix and bilateral tubes was completely detached and passed vaginally.  The specimen was sent to pathology.  The occluder was put back in place vaginally.  Hemostasis was confirmed at all levels in the pelvis.  The instruments were switched to the cutting needle driver in the third arm and the long tip in the first arm.  We used a V-Loc 0 at 9 inches to close the vaginal vault.  We started at the right vaginal angle and closed with a running suture all the way to the left vaginal angle and came back to the right.  The needle was removed from the abdominopelvic cavity.  We irrigated and suction the pelvic cavity.  Hemostasis was adequate at all levels.  Pictures were taken of the pelvic anatomy before the hysterectomy and after the closure of the vagina.  Good peristalsis was confirmed at the level of the ureters bilaterally.  The urine was clear.  Robotic instruments were removed under direct vision.  The robot was undocked.  We went to laparoscopy time.  Trendelenburg was progressively removed and the pelvic cavity was suctioned with the Nezhat.  We then removed all laparoscopy instruments under direct vision.  The ports were removed under direct vision and the CO2 was evacuated.  The pursestring stitch at the supraumbilical incision was attached.  We used the Bovie to complete hemostasis at the skin incisions.  All incisions were closed with a subcuticular suture of Vicryl 4-0.  Dermabond was added.  The vaginal occluder was removed.  The patient was brought to recovery room in good and stable status.  ESTIMATED BLOOD LOSS: 20 mL   Intake/Output Summary (Last 24 hours) at 10/08/2022 1122 Last data filed at 10/08/2022 1100 Gross per 24 hour  Intake 1400 ml  Output 370 ml  Net 1030 ml     BLOOD ADMINISTERED:none   LOCAL MEDICATIONS USED:  MARCAINE     SPECIMEN:  Source of Specimen:  Uterus with cervix and bilateral tubes  DISPOSITION OF SPECIMEN:   PATHOLOGY  COUNTS:  YES  PLAN OF CARE: Transfer to PACU  Marie-Lyne LavoieMD11:22 AM

## 2022-10-08 NOTE — Anesthesia Postprocedure Evaluation (Signed)
Anesthesia Post Note  Patient: BRAYLEIGH RYBACKI  Procedure(s) Performed: XI ROBOTIC ASSISTED LAPAROSCOPIC TOTAL HYSTERECTOMY AND BILATERAL SALPINGECTOMY (Bilateral: Abdomen)     Patient location during evaluation: PACU Anesthesia Type: General Level of consciousness: awake and alert Pain management: pain level controlled Vital Signs Assessment: post-procedure vital signs reviewed and stable Respiratory status: spontaneous breathing, nonlabored ventilation, respiratory function stable and patient connected to nasal cannula oxygen Cardiovascular status: blood pressure returned to baseline and stable Postop Assessment: no apparent nausea or vomiting Anesthetic complications: no   No notable events documented.  Last Vitals:  Vitals:   10/08/22 1130 10/08/22 1159  BP: (!) 100/57 (!) 88/53  Pulse: 64 62  Resp: 15 15  Temp: 36.7 C 36.5 C  SpO2: 93% 95%    Last Pain:  Vitals:   10/08/22 1159  TempSrc: Oral  PainSc:                  Uzma Hellmer S

## 2022-10-09 ENCOUNTER — Encounter (HOSPITAL_COMMUNITY): Payer: Self-pay | Admitting: Obstetrics & Gynecology

## 2022-10-09 DIAGNOSIS — N921 Excessive and frequent menstruation with irregular cycle: Secondary | ICD-10-CM | POA: Diagnosis not present

## 2022-10-09 LAB — SURGICAL PATHOLOGY

## 2022-10-09 MED ORDER — OXYCODONE-ACETAMINOPHEN 7.5-325 MG PO TABS: 1.0000 | ORAL_TABLET | Freq: Four times a day (QID) | ORAL | 0 refills | Status: DC | PRN

## 2022-10-09 NOTE — Progress Notes (Signed)
POD#1 XI Robotic TLH/BS  Subjective: Patient reports tolerating PO and no problems voiding.    Objective: I have reviewed patient's vital signs.  vital signs, intake and output, medications, and labs.  Vitals:   10/09/22 0021 10/09/22 0430  BP: (!) 104/58 (!) 101/58  Pulse: 88 81  Resp:    Temp: 98.6 F (37 C) 98.3 F (36.8 C)  SpO2: 95% 96%   I/O last 3 completed shifts: In: 3381.9 [I.V.:3181.9; IV Piggyback:200] Out: 370 [Urine:350; Blood:20] No intake/output data recorded.  Results for orders placed or performed during the hospital encounter of 10/08/22 (from the past 24 hour(s))  Creatinine, serum     Status: None   Collection Time: 10/08/22  8:37 AM  Result Value Ref Range   Creatinine, Ser 0.71 0.44 - 1.00 mg/dL   GFR, Estimated >60 >60 mL/min  CBC     Status: Abnormal   Collection Time: 10/08/22  3:03 PM  Result Value Ref Range   WBC 13.7 (H) 4.0 - 10.5 K/uL   RBC 3.93 3.87 - 5.11 MIL/uL   Hemoglobin 13.3 12.0 - 15.0 g/dL   HCT 38.8 36.0 - 46.0 %   MCV 98.7 80.0 - 100.0 fL   MCH 33.8 26.0 - 34.0 pg   MCHC 34.3 30.0 - 36.0 g/dL   RDW 11.7 11.5 - 15.5 %   Platelets 281 150 - 400 K/uL   nRBC 0.0 0.0 - 0.2 %    EXAM General: alert, cooperative, and appears stated age Resp: clear to auscultation bilaterally Cardio: Regular rate and rhythm GI: normal findings: bowel sounds normal and incision: clean, dry, and intact Extremities: no edema, redness or tenderness in the calves or thighs Vaginal Bleeding: none  Assessment: s/p Procedure(s): XI ROBOTIC ASSISTED LAPAROSCOPIC TOTAL HYSTERECTOMY AND BILATERAL SALPINGECTOMY: stable, progressing well, and tolerating diet  Plan: Advance diet Encourage ambulation Discontinue IV fluids Discharge home  LOS: 0 days    Princess Bruins, MD 10/09/2022 7:57 AM

## 2022-10-15 ENCOUNTER — Telehealth: Payer: Self-pay | Admitting: *Deleted

## 2022-10-15 NOTE — Telephone Encounter (Signed)
Patient called post XI ROBOTIC ASSISTED LAPAROSCOPIC TOTAL HYSTERECTOMY AND BILATERAL SALPINGECTOMY on 10/08/22. Has post op scheduled on 10/17/22, asked if she can drive. I checked with Glorianne Manchester, RN and I was told okay for patient to drive as long as patient is not taking narcotic pain medication. Patient reports she is not taking pain medication.

## 2022-10-17 ENCOUNTER — Ambulatory Visit (INDEPENDENT_AMBULATORY_CARE_PROVIDER_SITE_OTHER): Payer: BC Managed Care – PPO | Admitting: Obstetrics & Gynecology

## 2022-10-17 ENCOUNTER — Encounter: Payer: Self-pay | Admitting: Obstetrics & Gynecology

## 2022-10-17 VITALS — BP 106/68 | HR 78

## 2022-10-17 DIAGNOSIS — Z09 Encounter for follow-up examination after completed treatment for conditions other than malignant neoplasm: Secondary | ICD-10-CM

## 2022-10-17 NOTE — Progress Notes (Signed)
    Kara Anderson 01-Aug-1976 [id number]        46 y.o.  G0P0000   RP: Postop XI Robotic TLH/BS on 10/08/22  HPI: Good postop evolution.  Incisions well closed with no redness.  Mildly tender at the umbilical incision.  No abdominopelvic pain except when lifting her bag to go to work.  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  No vaginal bleeding.  No vaginal discharge.  Urine/BMs normal.  No fever.   OB History  Gravida Para Term Preterm AB Living  0 0 0 0 0 0  SAB IAB Ectopic Multiple Live Births  0 0 0 0    Obstetric Comments  1st Menstrual Cycle:  12    Past medical history,surgical history, problem list, medications, allergies, family history and social history were all reviewed and documented in the EPIC chart.   Directed ROS with pertinent positives and negatives documented in the history of present illness/assessment and plan.  Exam:  There were no vitals filed for this visit. General appearance:  Normal  Abdomen: Normal.  Incisions intact, close with no erythema or induration.  Soft, not distended.  Gynecologic exam: Vulva normal.  Speculum:  Vaginal vault closed.  No bleeding.  Normal secretions.   Assessment/Plan:  46 y.o. G0P0000   1. Status post gynecological surgery, follow-up exam  Good postop evolution.  Incisions well closed with no redness.  Mildly tender at the umbilical incision.  No abdominopelvic pain except when lifting her bag to go to work.  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  No vaginal bleeding.  No vaginal discharge.  Urine/BMs normal.  No fever.  Good postop exam.  Excellent healing at 9 days postop.  Precautions reviewed with patient. No fitness activities.  No IC until 8 weeks postop.  F/U in 6 weeks to check vaginal vault.  Princess Bruins MD, 10:42 AM 10/17/2022

## 2022-10-20 ENCOUNTER — Telehealth: Payer: Self-pay | Admitting: *Deleted

## 2022-10-20 NOTE — Telephone Encounter (Signed)
Patient post Postop XI Robotic TLH/BS on 10/08/22 had post op on 10/17/22. Patient report just now when she went the bathroom she wiping and noticed bright red blood, she also noticed on panties as well. Her 6 week post op is scheduled on 11/28/22. Please advise

## 2022-10-21 NOTE — Telephone Encounter (Addendum)
Patient informed with [facility] recommendations " Keep postop appointment as is.  Lay down and rest for 3 days.  She is over active.    Patient reports she does have to work today,but will be sitting down the whole time and will take it easy while sitting. She is off starting tomorrow and remaining of the week. She did mention the blood is not as bright red as it was yesterday. Still notices when wiping and small amount in toilet.  Just FYI

## 2022-10-24 ENCOUNTER — Ambulatory Visit (INDEPENDENT_AMBULATORY_CARE_PROVIDER_SITE_OTHER): Payer: BC Managed Care – PPO | Admitting: Obstetrics & Gynecology

## 2022-10-24 ENCOUNTER — Telehealth: Payer: Self-pay | Admitting: *Deleted

## 2022-10-24 ENCOUNTER — Encounter: Payer: Self-pay | Admitting: Obstetrics & Gynecology

## 2022-10-24 VITALS — BP 110/72 | HR 79 | Temp 97.6°F

## 2022-10-24 DIAGNOSIS — N99820 Postprocedural hemorrhage and hematoma of a genitourinary system organ or structure following a genitourinary system procedure: Secondary | ICD-10-CM

## 2022-10-24 NOTE — Telephone Encounter (Addendum)
Call returned to patient.  XI Robotic TLH/BS on 10/08/22.  Patient reports she continue to see bright red spotting, scant amount. Reports "deep" cramping lower right side. Pain 5/10. Denies fever/chills, N/V, redness/swelling, urinary symptoms, vaginal odor.   Per review of 11/6 telephone encounter, patient was advised to rest, reduce activity.   Patient states she did rest until yesterday and plans to rest today. She reports she is lifting greater than 10 lbs at times.   Advised patient she will need to rest and follow restrictions provided to allow her body to heal. Advised OV advised if pain continues, fever/chills  or vaginal odor develops, bleeding becomes heavy and/or changing saturated pad q1-2 hours.   Patient states she is going to rest now, is aware to contact office if symptoms do not resolve. Advised patient of process to contact on-call provider as we are going into the weekend, ER precautions if symptoms worsen. Will forward to Dr. Dellis Filbert to review and our office will f/u if any additional recommendations. Patient verbalizes understanding and is agreeable.    Dr. Dellis Filbert -please review.   If any f/u need or OV needed, please route to [facility] Triage.   Cc: [facility] Triage

## 2022-10-24 NOTE — Progress Notes (Signed)
    Kara Anderson 1976-07-14 [id number]        46 y.o.  G0 Married  RP: Vaginal bleeding and pelvic discomfort post XI Robotic TLH/BS on 10/08/22  HPI:  Patient has her own company, a Gym, and has worked and lifted too much in the past week.  C/O vaginal spotting today, some red bleeding yesterday.  Feels pulling and cramping discomfort at the mid-lower abdomen.  Urine and BMs normal.  No fever.  OB History  Gravida Para Term Preterm AB Living  0 0 0 0 0 0  SAB IAB Ectopic Multiple Live Births  0 0 0 0    Obstetric Comments  1st Menstrual Cycle:  12    Past medical history,surgical history, problem list, medications, allergies, family history and social history were all reviewed and documented in the EPIC chart.   Directed ROS with pertinent positives and negatives documented in the history of present illness/assessment and plan.  Exam:  Vitals:   10/24/22 1450  BP: 110/72  Pulse: 79  Temp: 97.6 F (36.4 C)  TempSrc: Oral  SpO2: 97%   General appearance:  Normal  Abdomen: Incisions well closed, no erythema or induration.  Abdomen soft, not distended.  BSs present and normal.  Gynecologic exam: Vulva normal.  Speculum:  Vaginal vault closed.  Minimal brownish blood mixed with secretions.  No active bleeding.   Assessment/Plan:  46 y.o. G0P0000   1. Postoperative vaginal bleeding following genitourinary procedure Mild spotting from the vaginal vault 16 days post XI Robotic TLH/BS with closed vaginal vault and no sign of infection.  Too much physical activity hindering the healing process.  Strongly recommend complete bed rest with movement of the legs and getting up to eat for the next 2 weeks.  No work and no lifting until reassessment in 2 weeks.  Bleeding precautions discussed.    Other orders - predniSONE (DELTASONE) 5 MG tablet; Take 5 mg by mouth daily with breakfast.   Princess Bruins MD, 3:06 PM 10/24/2022

## 2022-10-24 NOTE — Telephone Encounter (Signed)
Sent to [place] to schedule.

## 2022-10-27 NOTE — Telephone Encounter (Signed)
Per review of EPIC, patient seen in office on 10/24/22.   Encounter closed.

## 2022-10-29 NOTE — Progress Notes (Deleted)
Cardiology Office Note:    Date:  10/29/2022   ID:  Kara Anderson, DOB [birth year], MRN [id number]  PCP:  Dion Body, MD   [facility] Providers Cardiologist:  Freada Bergeron, MD {  Referring MD: Dion Body, MD     History of Present Illness:    Kara Anderson is a 46 y.o. 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  Previously followed by [facility] with last visit 02/19/21. Has history of Takayasu's arteritis, PA stenosis (08/2018 L. PA PTA) and mediastinal mass (11/2017 excised c/b elevated L. Hemidiaphragm). She has been seen for episodes of chest pain with work-up including TTE, ECG and enzymes were normal. She had a chest CT August 2020 which showed a patent pulmonary artery stent and no significant CAD. Was seen at [facility] and was diagnosed with microvascular dysfunction due to persistent substernal chest pain. Has been on imdur and amlodipine for microvascular dysfunction. She wants a Arts administrator to help manage her medications.  Seen in clinic on 04/24/21 where she was complaining of significant chest pressure and SOB in the setting of known microvascular dysfunction. Was also having episodes of hypotension with the amlodipine. We made multiple adjustments to her meds to try and improve symptoms.   Seen 07/08/21. She had discontinued Amlodipine due to hypotension and had adjusted dose of Imdur and nitroglycerin patch with improvement in symptoms.    Seen 09/19/21 with persistent angina despite PRN PO nitroglycerin, Imdur '120mg'$  BID, and 0.'4mg'$  nitroglycerin patch. Amlodipine 2.'5mg'$  QD was initiated. Via MyChart message 09/26/21 Amlodipine was increased to '5mg'$  QD and nitro patch reduced to 0.'2mg'$ .    Seen 10/04/21 with some improvement in symptoms. Still felt there was room for improvement in anginal symptoms and  hopeful to further wean off nitroglycerin patch. At that visit and via MyChart message Amlodipine 2.'5mg'$  QD and Toprol 12.'5mg'$  QHS were able to be tolerated and nitroglycerin patch discontinued.    Saw Laurann Montana on 11/15/21 where she continued to have significant symptoms with concern for nitro tolerance. Since that time, we trialed her off amlodipine and on diltiazem.   Seen on 02/2022 where she was recommended continue Imdur '120mg'$  BID, Ranexa '1000mg'$  BID, and retrial nitro patch 0.'3mg'$  QD. Metoprolol discontinued due to fatigue.    She was admitted 04/2022 with pneumonia. She has since established with Dr. Lanney Gins of [facility] pulmonology. Last seen 06/18/22 by Dr. Lanney Gins due to hypersensitivity pneumonitis with Farmer's lung (abnormal CT with ground glass infiltrates) recommended for Itraconazole '100mg'$  QD x 6 months and Prednisone '5mg'$  QD.   Was last seen 06/2022 by Laurann Montana where she continued to have chest discomfort with exertion as well as joint pain. Cardiac PET showed normal EF and normal MBF with appropriate augmentation with stress (myocardial blood flow reserve was 3.32).  Today, ***  Past Medical History:  Diagnosis Date   Allergy    Anemia    d/t medications per patient   Anxiety    Bronchitis 08/20/2022   micropurulent and recurrent, pt started on Augmentin 08/20/22, see OV w/ Dr. Adrian Prows on 08/20/22.   Decreased libido 09/29/2019   Depression    Endometriosis 2007,2009   Family history of brain cancer    Family history of breast cancer    Family history of colon cancer    Family history of pancreatic cancer    Farmer's  lung ([place]) 08/20/2022   Pt is being treated with Trelegy inhaler and Itraconazole.   GERD (gastroesophageal reflux disease)    Heart murmur    pt reports she no longer has this d/t control of her vasculitis   HLD (hyperlipidemia) 09/29/2019   Hypersensitivity pneumonitis ([place])    As of 08/20/22, pt is being treated w/ Itraconazole. Pt follws with  pulmonologist, Dr. Ottie Glazier @ [place], [place] 06/18/22. Pt had abnormal chest imaging w/ ground glass infiltrates.   Large vessel vasculitis ([place])    Follwed by Dr. Valeta Harms @ [facility], [place] 07/02/22. Pt is being treated with infusions of infliximab.   Malaise and fatigue 09/29/2019   Mediastinal adenopathy    Microvascular angina    microvascular dysfunction and chronic angina, follows w/ Dr. Johney Frame at [facility], LOV 06/27/22 as of 08/20/22, Patient is currently on Ranexa, Imdur, NTG patch and sublingual NTG prn., 05/29/22 Echo in [place] showed EF 65 - 70%, 08/19/22 NM PET CT Cardiac Perfusion in Epic showed no evidence of ischemia   Migraines 02/2022   Daily HA w/ constant pressure, bi-temporal pain, photophobia, phonophobia and nausea. 07/27/22 MRI brain showed a normal appearance. Pt started on Topamax and Nurtec on 07/29/22. See 07/29/22 OV w/ neurologist Dr. Lurena Nida @ [place].   Mononucleosis    Pneumonia    PONV (postoperative nausea and vomiting)    Pulmonary nodules 12/13/2021   tiny 2 -3 mm nodules per 12/13/21 Chest CT in Epic done for persistent cough and night sweats   Seasonal allergies    Seronegative rheumatoid arthritis ([place])    Followed by Dr. Ula Lingo at [place], [place] 07/02/22. Pt receives infusions of [place], last infusion 07/12/22 as of 08/21/22.   Status post pulmonary artery branches stent placement    left main pulmonary artery stent   Strep throat    Takayasu's arteritis ([place])    Followed by Dr. Ula Lingo at [place], [place] 07/02/22 in [place].   Thyroid disease    hyothyroidism   Viral meningitis    Vitamin D deficiency disease 09/29/2019    Past Surgical History:  Procedure Laterality Date   HIP SURGERY Right 12/16/2011   IR THORACENTESIS ASP PLEURAL SPACE W/IMG GUIDE  11/20/2017   LAPAROSCOPIC ABDOMINAL EXPLORATION  2007,2009   endometriosis   MEDIASTINOTOMY CHAMBERLAIN MCNEIL    Left 11/16/2017    Procedure: LEFT ANTERIOR MEDIASTINOTOMY CHAMBERLAIN PROCEDURE;  Surgeon: [name], MD;  Location: [facility] OR;  Service: Thoracic;  Laterality: Left;   NOSE SURGERY  12/15/2005   sinus surgery - reconstructe turbinates and deviated septum   Pulmonary Artery Stent Left 10/2018   ROBOTIC ASSISTED LAPAROSCOPIC HYSTERECTOMY AND SALPINGECTOMY Bilateral 10/08/2022   Procedure: XI ROBOTIC ASSISTED LAPAROSCOPIC TOTAL HYSTERECTOMY AND BILATERAL SALPINGECTOMY;  Surgeon: Princess Bruins, MD;  Location: [place];  Service: Gynecology;  Laterality: Bilateral;    Current Medications: No outpatient medications have been marked as taking for the 10/31/22 encounter (Appointment) with Freada Bergeron, MD.     Allergies:   Codeine   Social History   Socioeconomic History   Marital status: Married    Spouse name: Not on file   Number of children: Not on file   Years of education: Not on file   Highest education level: Not on file  Occupational History   Not on file  Tobacco Use   Smoking status: Never    Passive exposure: Never   Smokeless tobacco: Never  Vaping Use  Vaping Use: Never used  Substance and Sexual Activity   Alcohol use: No    Alcohol/week: 0.0 standard drinks of alcohol   Drug use: No   Sexual activity: Not Currently    Partners: Male    Birth control/protection: None  Other Topics Concern   Not on file  Social History Narrative   Married for 12 years.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   Social Determinants of Health   Financial Resource Strain: Not on file  Food Insecurity: Not on file  Transportation Needs: Not on file  Physical Activity: Not on file  Stress: Not on file  Social Connections: Not on file     Family History: The patient's family history includes Alcohol abuse in her father; Alcoholism in her father; Brain cancer (age of onset: 82) in her mother; Breast cancer in her maternal aunt; Breast cancer (age of onset:  62) in her cousin; Colon cancer in her maternal grandmother; Colonic polyp (age of onset: 20) in her sister; Diabetes in her mother; Heart attack in her paternal grandfather; Heart disease in her father and mother; Leukemia (age of onset: 34) in her cousin; Lung cancer (age of onset: 48) in her paternal uncle; Other in her sister; Other (age of onset: 16) in her paternal uncle; Pancreatic disease in her maternal grandmother; Stomach cancer in her maternal grandmother; Stroke in her mother. There is no history of Esophageal cancer, Liver disease, or Rectal cancer.  ROS:   Please see the history of present illness.    Review of Systems  Constitutional:  Negative for chills, fever and malaise/fatigue.  HENT:  Negative for sore throat.   Eyes:  Negative for blurred vision.  Respiratory:  Positive for shortness of breath.   Cardiovascular:  Positive for chest pain. Negative for palpitations, orthopnea, claudication, leg swelling and PND.  Gastrointestinal:  Negative for melena, nausea and vomiting.  Genitourinary:  Negative for flank pain.  Musculoskeletal:  Positive for myalgias. Negative for falls.       Positive for bilateral finger swelling  Neurological:  Negative for dizziness and loss of consciousness.  Endo/Heme/Allergies:  Does not bruise/bleed easily.  Psychiatric/Behavioral:  Negative for substance abuse.     EKGs/Labs/Other Studies Reviewed:    The following studies were reviewed today: Cardiac PET 08/2022:    LV perfusion is normal. There is no evidence of ischemia. There is no evidence of infarction.   Rest left ventricular function is normal. Rest EF: 58 %. Stress left ventricular function is normal. Stress EF: 70 %. End diastolic cavity size is normal. End systolic cavity size is normal.   Myocardial blood flow was computed to be 0.51m/g/min at rest and 3.244mg/min at stress. Global myocardial blood flow reserve was 3.32 and was normal.   Coronary calcium was absent on the  attenuation correction CT images.   The study is normal. The study is low risk.  TTE 01/19/20:  1. Left ventricular ejection fraction, by visual estimation, is 55 to  60%. The left ventricle has normal function. There is no left ventricular  hypertrophy.   2. The left ventricle has no regional wall motion abnormalities.   3. Global right ventricle has normal systolic function.The right  ventricular size is normal. No increase in right ventricular wall  thickness.   4. Left atrial size was normal.   5. Right atrial size was normal.   6. The mitral valve is normal in structure. Mild mitral valve  regurgitation. No evidence of mitral  stenosis.   7. The tricuspid valve is normal in structure.   8. The tricuspid valve is normal in structure. Tricuspid valve  regurgitation is mild.   9. The aortic valve is normal in structure. Aortic valve regurgitation is  not visualized. No evidence of aortic valve sclerosis or stenosis.  10. The pulmonic valve was normal in structure. Pulmonic valve  regurgitation is not visualized.  11. Normal pulmonary artery systolic pressure.  12. The tricuspid regurgitant velocity is 1.66 m/s, and with an assumed  right atrial pressure of 3 mmHg, the estimated right ventricular systolic  pressure is normal at 14.0 mmHg.  13. The inferior vena cava is normal in size with greater than 50%  respiratory variability, suggesting right atrial pressure of 3 mmHg.  14. The average left ventricular global longitudinal strain is 21.2 %.  Carotid ultrasound 10/2017: Final Interpretation:  Right Carotid: There was no evidence of thrombus, dissection,  atherosclerotic                 plaque or stenosis in the cervical carotid system.                 Imaging over the aortic arch shows what appears to be color                 turbulence near the pulmonary artery. Short axis and  subcostal                 views of the heart revealed peak CW gradient of the  pulmonic                  artery in the range of 40-54 mmHg. Pulmonic insufficiency  also                 noted.   Left Carotid: There was no evidence of thrombus, dissection,  atherosclerotic                plaque or stenosis in the cervical carotid system.   Vertebrals:  Both vertebral arteries were patent with antegrade flow.  Subclavians: Normal flow hemodynamics were seen in bilateral subclavian               arteries.               There is also a mobile linear structure in the right  subclavian               vein Discussed               with lead tech who thought structure was a valve but appears longer than usual  EKG:  EKG was not ordered today  Recent Labs: 10/02/2022: BUN 8; Potassium 4.4; Sodium 136 10/08/2022: Creatinine, Ser 0.71; Hemoglobin 13.3; Platelets 281  Recent Lipid Panel    Component Value Date/Time   CHOL 261 (H) 05/09/2021 1050   TRIG 137 05/09/2021 1050   HDL 69 05/09/2021 1050   CHOLHDL 3.8 05/09/2021 1050   LDLCALC 165 (H) 05/09/2021 1050      Physical Exam:    VS:  LMP 09/21/2022 (Exact Date)     Wt Readings from Last 3 Encounters:  10/08/22 167 lb (75.8 kg)  10/02/22 167 lb 3.2 oz (75.8 kg)  09/24/22 168 lb (76.2 kg)     GEN:  Well nourished, well developed in no acute distress HEENT: Normal NECK: No JVD; No carotid bruits LYMPHATICS: No lymphadenopathy CARDIAC: RRR, no murmurs, rubs, gallops RESPIRATORY:  Clear to auscultation without rales, wheezing or rhonchi  ABDOMEN: Soft, non-tender, non-distended MUSCULOSKELETAL:  No edema; No deformity  SKIN: Warm and dry NEUROLOGIC:  Alert and oriented x 3 PSYCHIATRIC:  Normal affect   ASSESSMENT:    No diagnosis found.   PLAN:    In order of problems listed above:  #Microvascular Dysfunction with Angina: Patient was diagnosed with microvascular dysfunction at [facility] clinic. We have tried multiple different medication regimens to try to manage symptoms without causing too much hypotension. Overall,  she has had significant improvement but continues to have breakthrough episodes. NM PET on therapy reassuring with normal EF and normal/robust global myocardial blood flow reserve (3.22). Will continue medication adjustments as able.  -Continue imdur '120mg'$  BID -Continue ranexa '100mg'$  BID -COntinue nitro patch 0.'3mg'$  daily; can take intermittent breaks to prevent tolerance -Continue dilt '120mg'$  qPM (will stop AM dosing for now) -Stopped metop due fatigue -Continue nitro prn for breakthrough -Encouraged her to stay very hydrated and drink electrolyte replacement drinks (such as gatorade/poweraide zero) as well as to liberalize her salt intake to help keep her blood pressure up  #Pneumonitis and Farmer's Lung: Follows with Dr. Lanney Gins of pulmonology. Improvement in dyspnea since Trelegy. Currentlyon Itraconazole and Prednisone.    #Pulmonary artery stenosis s/p stenting 09/09/2018: -06/2018 Echo: EF >55%, LAE 3.6 cm, mild PR, dilated PA and IVC, increased velocities through pulmonary artery. Peak MPA velocity 2.70M/S, peak gradient 24 mmHg  -06/2018 Chest MRI: Stenosis of the right main and left main pulmonary arteries measuring 9 and 8 mm, respectively, most likely related to reported prior episode of Takayasu's arteritis. Detection of active vasculitic inflammation is limited by MR angiogram due to significant motion artifact. Scattered left lower lobe nodules may represent atelectasis versus aspiration. August 20, 2018 RHC: RA mean 7 mmHg, RV 33/6 mmHg, PA 32/18 mean 17, PWP - 8 mmHg, Index 4.6 PTA of her left pulmonary artery 08/2018 Echo: EF >55%, dilated PA -Followed by [facility]; has been stable  -Continue ASA '81mg'$  daily -Continue imdur '120mg'$  BID -Cath at [facility] in 2020 with no significant CAD  #Takayatsu Arteritis: #RA Followed by Rheum. On MTX. -Follow-up with rheum as scheduled -Continue MTX   Medication Adjustments/Labs and Tests Ordered: Current medicines are reviewed at length with  the patient today.  Concerns regarding medicines are outlined above.  No orders of the defined types were placed in this encounter.  No orders of the defined types were placed in this encounter.   There are no Patient Instructions on file for this visit.   Signed, Freada Bergeron, MD  10/29/2022 1:09 PM    [place]

## 2022-10-31 ENCOUNTER — Ambulatory Visit: Payer: BC Managed Care – PPO | Attending: Cardiology | Admitting: Cardiology

## 2022-10-31 ENCOUNTER — Encounter: Payer: Self-pay | Admitting: Cardiology

## 2022-10-31 VITALS — BP 102/68 | HR 81 | Ht 64.0 in | Wt 172.0 lb

## 2022-10-31 DIAGNOSIS — E782 Mixed hyperlipidemia: Secondary | ICD-10-CM

## 2022-10-31 DIAGNOSIS — M314 Aortic arch syndrome [Takayasu]: Secondary | ICD-10-CM

## 2022-10-31 DIAGNOSIS — Z79899 Other long term (current) drug therapy: Secondary | ICD-10-CM

## 2022-10-31 DIAGNOSIS — R072 Precordial pain: Secondary | ICD-10-CM

## 2022-10-31 DIAGNOSIS — I2089 Other forms of angina pectoris: Secondary | ICD-10-CM

## 2022-10-31 DIAGNOSIS — Q256 Stenosis of pulmonary artery: Secondary | ICD-10-CM

## 2022-10-31 MED ORDER — ROSUVASTATIN CALCIUM 10 MG PO TABS: 10.0000 mg | ORAL_TABLET | Freq: Every day | ORAL | 1 refills | Status: DC

## 2022-10-31 NOTE — Patient Instructions (Addendum)
Medication Instructions:   START TAKING ROSUVASTATIN (CRESTOR) 10 MG BY MOUTH DAILY  *If you need a refill on your cardiac medications before your next appointment, please call your pharmacy*   You have been referred to [place]    Lab Work:  NEXT WEEK-NMR LIPOPROFILE, LIPOPROTEIN A, AND APOLIPOPROTEIN B--PLEASE COME FASTING  If you have labs (blood work) drawn today and your tests are completely normal, you will receive your results only by: [employer] (if you have MyChart) OR A paper copy in the mail If you have any lab test that is abnormal or we need to change your treatment, we will call you to review the results.   Follow-Up: At [facility], you and your health needs are our priority.  As part of our continuing mission to provide you with exceptional heart care, we have created designated Provider Care Teams.  These Care Teams include your primary Cardiologist (physician) and Advanced Practice Providers (APPs -  Physician Assistants and Nurse Practitioners) who all work together to provide you with the care you need, when you need it.  We recommend signing up for the patient portal called "MyChart".  Sign up information is provided on this After Visit Summary.  MyChart is used to connect with patients for Virtual Visits (Telemedicine).  Patients are able to view lab/test results, encounter notes, upcoming appointments, etc.  Non-urgent messages can be sent to your provider as well.   To learn more about what you can do with MyChart, go to [email or web address].    Your next appointment:   6 month(s)  The format for your next appointment:   In Person  Provider:   Freada Bergeron, MD      Important Information About Sugar

## 2022-10-31 NOTE — Progress Notes (Signed)
Cardiology Office Note:    Date:  10/31/2022   ID:  Kara [facility], DOB January 12, 1976, MRN [id number]  PCP:  Kara Body, MD   [facility] Providers Cardiologist:  Freada Bergeron, MD {  Referring MD: Kara Body, MD     History of Present Illness:    Kara Anderson is a 46 y.o. 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  Previously followed by [facility] with last visit 02/19/21. Has history of Takayasu's arteritis, PA stenosis (08/2018 L. PA PTA) and mediastinal mass (11/2017 excised c/b elevated L. Hemidiaphragm). She has been seen for episodes of chest pain with work-up including TTE, ECG and enzymes were normal. She had a chest CT August 2020 which showed a patent pulmonary artery stent and no significant CAD. Was seen at [facility] and was diagnosed with microvascular dysfunction due to persistent substernal chest pain. Has been on imdur and amlodipine for microvascular dysfunction. She wants a Arts administrator to help manage her medications.  Seen in clinic on 04/24/21 where she was complaining of significant chest pressure and SOB in the setting of known microvascular dysfunction. Was also having episodes of hypotension with the amlodipine. We made multiple adjustments to her meds to try and improve symptoms.   Seen 07/08/21. She had discontinued Amlodipine due to hypotension and had adjusted dose of Imdur and nitroglycerin patch with improvement in symptoms.    Seen 09/19/21 with persistent angina despite PRN PO nitroglycerin, Imdur '120mg'$  BID, and 0.'4mg'$  nitroglycerin patch. Amlodipine 2.'5mg'$  QD was initiated. Via MyChart message 09/26/21 Amlodipine was increased to '5mg'$  QD and nitro patch reduced to 0.'2mg'$ .    Seen 10/04/21 with some improvement in symptoms. Still felt there was room for improvement in anginal symptoms and  hopeful to further wean off nitroglycerin patch. At that visit and via MyChart message Amlodipine 2.'5mg'$  QD and Toprol 12.'5mg'$  QHS were able to be tolerated and nitroglycerin patch discontinued.    Saw K[facility] on 11/15/21 where she continued to have significant symptoms with concern for nitro tolerance. Since that time, we trialed her off amlodipine and on diltiazem.   Seen on 02/2022 where she was recommended continue Imdur '120mg'$  BID, Ranexa '1000mg'$  BID, and retrial nitro patch 0.'3mg'$  QD. Metoprolol discontinued due to fatigue.    She was admitted 04/2022 with pneumonia. She has since established with Dr. Lanney [facility] of [facility]. Last seen 06/18/22 by Dr. Lanney Anderson due to hypersensitivity pneumonitis with [facility]'s lung (abnormal CT with ground glass infiltrates) recommended for Itraconazole '100mg'$  QD x 6 months and Prednisone '5mg'$  QD.   Was last seen 06/2022 by [facility] where she continued to have chest discomfort with exertion as well as joint pain. Cardiac PET showed normal EF and normal MBF with appropriate augmentation with stress (myocardial blood flow reserve was 3.32).  Today, she says she is feeling good.   She states that she continues to experience chest pain and shortness of breath, but this is improved from prior visits. She feels like she can now manage her symptoms with the medications. She is otherwise doing well. Follows closely with her ID physician and Rheumatologist. Joint pain is better controlled.   She is worried about her cholesterol as LDL has been as high as 185. Now 170. Hesitant to start statin therapy due to her chronic joint pain and myalgias.  She denies any palpitations or peripheral  edema. No lightheadedness, headaches, syncope, orthopnea, or PND.  Past Medical History:  Diagnosis Date   Allergy    Anemia    d/t medications per patient   Anxiety    Bronchitis 08/20/2022   micropurulent and recurrent, pt started on Augmentin 08/20/22, see OV w/ Dr.  Adrian Anderson on 08/20/22.   Decreased libido 09/29/2019   Depression    Endometriosis 2007,2009   Family history of brain cancer    Family history of breast cancer    Family history of colon cancer    Family history of pancreatic cancer    [facility]'s lung ([place]) 08/20/2022   Pt is being treated with Trelegy inhaler and Itraconazole.   GERD (gastroesophageal reflux disease)    Heart murmur    pt reports she no longer has this d/t control of her vasculitis   HLD (hyperlipidemia) 09/29/2019   Hypersensitivity pneumonitis ([place])    As of 08/20/22, pt is being treated w/ Itraconazole. Pt follws with pulmonologist, Dr. Ottie [facility] @ [place], [place] 06/18/22. Pt had abnormal chest imaging w/ ground glass infiltrates.   Large vessel vasculitis ([place])    Follwed by Dr. Valeta [facility] @ [facility], [place] 07/02/22. Pt is being treated with infusions of infliximab.   Malaise and fatigue 09/29/2019   Mediastinal adenopathy    Microvascular angina    microvascular dysfunction and chronic angina, follows w/ Dr. Johney [facility] at [facility], LOV 06/27/22 as of 08/20/22, Patient is currently on Ranexa, Imdur, NTG patch and sublingual NTG prn., 05/29/22 Echo in [place] showed EF 65 - 70%, 08/19/22 NM PET CT Cardiac Perfusion in Epic showed no evidence of ischemia   Migraines 02/2022   Daily HA w/ constant pressure, bi-temporal pain, photophobia, phonophobia and nausea. 07/27/22 MRI brain showed a normal appearance. Pt started on Topamax and Nurtec on 07/29/22. See 07/29/22 OV w/ neurologist Dr. Lurena Nida @ [place].   Mononucleosis    Pneumonia    PONV (postoperative nausea and vomiting)    Pulmonary nodules 12/13/2021   tiny 2 -3 mm nodules per 12/13/21 Chest CT in Epic done for persistent cough and night sweats   Seasonal allergies    Seronegative rheumatoid arthritis ([place])    Followed by Dr. Ula [facility] at [place] [place] 07/02/22. Pt receives infusions of [place], last infusion 07/12/22 as of  08/21/22.   Status post pulmonary artery branches stent placement    left main pulmonary artery stent   Strep throat    Takayasu's arteritis ([place])    Followed by Dr. Ula [facility] at [place], [place] 07/02/22 in [place].   Thyroid disease    hyothyroidism   Viral meningitis    Vitamin D deficiency disease 09/29/2019    Past Surgical History:  Procedure Laterality Date   HIP SURGERY Right 12/16/2011   IR THORACENTESIS ASP PLEURAL SPACE W/IMG GUIDE  11/20/2017   LAPAROSCOPIC ABDOMINAL EXPLORATION  2007,2009   endometriosis   MEDIASTINOTOMY CHAMBERLAIN MCNEIL    Left 11/16/2017   Procedure: LEFT ANTERIOR MEDIASTINOTOMY CHAMBERLAIN PROCEDURE;  Surgeon: [name], MD;  Location: [facility];  Service: Thoracic;  Laterality: Left;   NOSE SURGERY  12/15/2005   sinus surgery - reconstructe turbinates and deviated septum   Pulmonary Artery Stent Left 10/2018   ROBOTIC ASSISTED LAPAROSCOPIC HYSTERECTOMY AND SALPINGECTOMY Bilateral 10/08/2022   Procedure: XI ROBOTIC ASSISTED LAPAROSCOPIC TOTAL HYSTERECTOMY AND BILATERAL SALPINGECTOMY;  Surgeon: Princess Bruins, MD;  Location: [facility];  Service: Gynecology;  Laterality: Bilateral;  Current Medications: Current Meds  Medication Sig   acidophilus (RISAQUAD) CAPS capsule Take 1 capsule by mouth daily.   albuterol (VENTOLIN HFA) 108 (90 Base) MCG/ACT inhaler Inhale 1-2 puffs into the lungs every 6 (six) hours as needed for shortness of breath or wheezing.   amitriptyline (ELAVIL) 10 MG tablet Take 30 mg by mouth at bedtime.   amLODipine (NORVASC) 2.5 MG tablet Take 1 tablet (2.5 mg total) by mouth daily.   aspirin 81 MG EC tablet Take 81 mg by mouth daily.   celecoxib (CELEBREX) 200 MG capsule Take 200 mg by mouth 2 (two) times daily.   Cholecalciferol (VITAMIN D-3) 125 MCG (5000 UT) TABS Take 5,000 Units by mouth every other day.   Coenzyme Q10 (CO Q 10 PO) Take 250 mg by mouth at bedtime.   Fluticasone-Umeclidin-Vilant (TRELEGY  ELLIPTA) 200-62.5-25 MCG/ACT AEPB Inhale 1 Inhalation into the lungs daily.   gabapentin (NEURONTIN) 300 MG capsule Take 600-900 mg by mouth See admin instructions. Take 600 mg by mouth in the morning, 600 mg at noon and 900 mg at bedtime   inFLIXimab-axxq (AVSOLA) 100 MG injection Inject 100 mg into the vein every 2 (two) months.   isosorbide mononitrate (IMDUR) 120 MG 24 hr tablet Take 1 tablet (120 mg total) by mouth 2 (two) times daily.   itraconazole (SPORANOX) 100 MG capsule Take 100 mg by mouth daily.   MAGNESIUM PO Take 400 mg by mouth at bedtime.   Multiple Vitamins-Minerals (MULTIVITAMIN ADULT) CHEW Chew 2 tablets by mouth daily.   NURTEC 75 MG TBDP Take 75 mg by mouth daily as needed (migraine).   omeprazole (PRILOSEC) 40 MG capsule Take 40 mg by mouth daily.   ondansetron (ZOFRAN-ODT) 8 MG disintegrating tablet Take 8 mg by mouth every 8 (eight) hours as needed for nausea or vomiting.   predniSONE (DELTASONE) 5 MG tablet Take 5 mg by mouth daily with breakfast.   ranolazine (RANEXA) 500 MG 12 hr tablet Take 2 tablets (1,000 mg total) by mouth 2 (two) times daily.   rosuvastatin (CRESTOR) 10 MG tablet Take 1 tablet (10 mg total) by mouth daily.   VITAMIN E PO Take 1 capsule by mouth every other day.     Allergies:   Codeine   Social History   Socioeconomic History   Marital status: Married    Spouse name: Not on file   Number of children: Not on file   Years of education: Not on file   Highest education level: Not on file  Occupational History   Not on file  Tobacco Use   Smoking status: Never    Passive exposure: Never   Smokeless tobacco: Never  Vaping Use   Vaping Use: Never used  Substance and Sexual Activity   Alcohol use: No    Alcohol/week: 0.0 standard drinks of alcohol   Drug use: No   Sexual activity: Not Currently    Partners: Male    Birth control/protection: None  Other Topics Concern   Not on file  Social History Narrative   Married for 12  years.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   Social Determinants of Health   Financial Resource Strain: Not on file  Food Insecurity: Not on file  Transportation Needs: Not on file  Physical Activity: Not on file  Stress: Not on file  Social Connections: Not on file     Family History: The patient's family history includes Alcohol abuse in her father; Alcoholism in her father;  Brain cancer (age of onset: 63) in her mother; Breast cancer in her maternal aunt; Breast cancer (age of onset: 40) in her cousin; Colon cancer in her maternal grandmother; Colonic polyp (age of onset: 66) in her sister; Diabetes in her mother; Heart attack in her paternal grandfather; Heart disease in her father and mother; Leukemia (age of onset: 41) in her cousin; Lung cancer (age of onset: 27) in her paternal uncle; Other in her sister; Other (age of onset: 40) in her paternal uncle; Pancreatic disease in her maternal grandmother; Stomach cancer in her maternal grandmother; Stroke in her mother. There is no history of Esophageal cancer, Liver disease, or Rectal cancer.  ROS:   Please see the history of present illness.    Review of Systems  Constitutional:  Negative for chills, fever and malaise/fatigue.  HENT:  Negative for sore throat.   Eyes:  Negative for blurred vision.  Respiratory:  Positive for shortness of breath.   Cardiovascular:  Positive for chest pain. Negative for palpitations, orthopnea, claudication, leg swelling and PND.  Gastrointestinal:  Negative for melena, nausea and vomiting.  Genitourinary:  Negative for flank pain.  Musculoskeletal:  Positive for myalgias. Negative for falls.       Positive for bilateral finger swelling  Neurological:  Negative for dizziness and loss of consciousness.  Endo/Heme/Allergies:  Does not bruise/bleed easily.  Psychiatric/Behavioral:  Negative for substance abuse.     EKGs/Labs/Other Studies Reviewed:    The following  studies were reviewed today:  Cardiac PET 08/2022:    LV perfusion is normal. There is no evidence of ischemia. There is no evidence of infarction.   Rest left ventricular function is normal. Rest EF: 58 %. Stress left ventricular function is normal. Stress EF: 70 %. End diastolic cavity size is normal. End systolic cavity size is normal.   Myocardial blood flow was computed to be 0.68m/g/min at rest and 3.266mg/min at stress. Global myocardial blood flow reserve was 3.32 and was normal.   Coronary calcium was absent on the attenuation correction CT images.   The study is normal. The study is low risk.  TTE 05/29/2022: 1. Global longitudinal strain is -23.6%. Left ventricular ejection  fraction, by estimation, is 65 to 70%. The left ventricle has normal  function. The left ventricle has no regional wall motion abnormalities.  Left ventricular diastolic parameters were  normal.   2. Right ventricular systolic function is normal. The right ventricular  size is normal.   3. The mitral valve is normal in structure. Trivial mitral valve  regurgitation.   4. The aortic valve is normal in structure. Aortic valve regurgitation is  not visualized.   5. The inferior vena cava is normal in size with greater than 50%  respiratory variability, suggesting right atrial pressure of 3 mmHg.   Comparison(s): The left ventricular function is unchanged. 01/19/20 EF  55-60%.    TTE 01/19/20:  1. Left ventricular ejection fraction, by visual estimation, is 55 to  60%. The left ventricle has normal function. There is no left ventricular  hypertrophy.   2. The left ventricle has no regional wall motion abnormalities.   3. Global right ventricle has normal systolic function.The right  ventricular size is normal. No increase in right ventricular wall  thickness.   4. Left atrial size was normal.   5. Right atrial size was normal.   6. The mitral valve is normal in structure. Mild mitral valve   regurgitation. No evidence of mitral stenosis.  7. The tricuspid valve is normal in structure.   8. The tricuspid valve is normal in structure. Tricuspid valve  regurgitation is mild.   9. The aortic valve is normal in structure. Aortic valve regurgitation is  not visualized. No evidence of aortic valve sclerosis or stenosis.  10. The pulmonic valve was normal in structure. Pulmonic valve  regurgitation is not visualized.  11. Normal pulmonary artery systolic pressure.  12. The tricuspid regurgitant velocity is 1.66 m/s, and with an assumed  right atrial pressure of 3 mmHg, the estimated right ventricular systolic  pressure is normal at 14.0 mmHg.  13. The inferior vena cava is normal in size with greater than 50%  respiratory variability, suggesting right atrial pressure of 3 mmHg.  14. The average left ventricular global longitudinal strain is 21.2 %.  Carotid ultrasound 10/2017: Final Interpretation:  Right Carotid: There was no evidence of thrombus, dissection,  atherosclerotic plaque or stenosis in the cervical carotid system.  Imaging over the aortic arch shows what appears to be color turbulence near the pulmonary artery. Short axis and subcostal views of the heart revealed peak CW gradient of the pulmonic artery in the range of 40-54 mmHg. Pulmonic insufficiency also noted.   Left Carotid: There was no evidence of thrombus, dissection,  atherosclerotic plaque or stenosis in the cervical carotid system.   Vertebrals:  Both vertebral arteries were patent with antegrade flow.  Subclavians: Normal flow hemodynamics were seen in bilateral subclavian arteries. There is also a mobile linear structure in the right  subclavian vein Discussed with lead tech who thought structure was a valve but appears longer than usual   EKG: EKG is personally reviewed. 10/31/22: EKG was not ordered.    Recent Labs: 10/02/2022: BUN 8; Potassium 4.4; Sodium 136 10/08/2022: Creatinine, Ser 0.71;  Hemoglobin 13.3; Platelets 281  Recent Lipid Panel    Component Value Date/Time   CHOL 261 (H) 05/09/2021 1050   TRIG 137 05/09/2021 1050   HDL 69 05/09/2021 1050   CHOLHDL 3.8 05/09/2021 1050   LDLCALC 165 (H) 05/09/2021 1050      Physical Exam:    VS:  BP 102/68   Pulse 81   Ht '5\' 4"'$  (1.626 m)   Wt 172 lb (78 kg)   LMP 09/21/2022 (Exact Date)   SpO2 98%   BMI 29.52 kg/m     Wt Readings from Last 3 Encounters:  10/31/22 172 lb (78 kg)  10/08/22 167 lb (75.8 kg)  10/02/22 167 lb 3.2 oz (75.8 kg)     GEN:  Well nourished, well developed in no acute distress HEENT: Normal NECK: No JVD; No carotid bruits CARDIAC: RRR, no murmurs, rubs, gallops RESPIRATORY:  Clear to auscultation without rales, wheezing or rhonchi  ABDOMEN: Soft, non-tender, non-distended MUSCULOSKELETAL:  No edema; No deformity  SKIN: Warm and dry NEUROLOGIC:  Alert and oriented x 3 PSYCHIATRIC:  Normal affect   ASSESSMENT:    1. Microvascular angina   2. Mixed hyperlipidemia   3. Medication management   4. Takayasu's arteritis ([place])   5. Precordial pain   6. Pulmonary artery stenosis    PLAN:    In order of problems listed above:  #Microvascular Dysfunction with Angina: Patient was diagnosed with microvascular dysfunction at [facility] clinic. We have tried multiple different medication regimens to try to manage symptoms without causing too much hypotension. Overall, she has had significant improvement but continues to have breakthrough episodes. NM PET on therapy reassuring with normal EF and normal/robust  global myocardial blood flow reserve (3.22). Will continue medication adjustments as able.  -Continue imdur '120mg'$  BID -Continue ranexa '100mg'$  BID -COntinue nitro patch 0.'3mg'$  daily; can take intermittent breaks to prevent tolerance -Continue dilt '120mg'$  qPM (will stop AM dosing for now) -Stopped metop due fatigue -Continue nitro prn for breakthrough -Encouraged her to stay very hydrated and drink  electrolyte replacement drinks (such as gatorade/poweraide zero) as well as to liberalize her salt intake to help keep her blood pressure up  #Pneumonitis and [facility]: Follows with Dr. Lanney [facility] of pulmonology. Improvement in dyspnea since Trelegy. Currently on Itraconazole and Prednisone.    #Pulmonary artery stenosis s/p stenting 09/09/2018: -06/2018 Echo: EF >55%, LAE 3.6 cm, mild PR, dilated PA and IVC, increased velocities through pulmonary artery. Peak MPA velocity 2.34M/S, peak gradient 24 mmHg  -06/2018 Chest MRI: Stenosis of the right main and left main pulmonary arteries measuring 9 and 8 mm, respectively, most likely related to reported prior episode of Takayasu's arteritis. Detection of active vasculitic inflammation is limited by MR angiogram due to significant motion artifact. Scattered left lower lobe nodules may represent atelectasis versus aspiration. August 20, 2018 RHC: RA mean 7 mmHg, RV 33/6 mmHg, PA 32/18 mean 17, PWP - 8 mmHg, Index 4.6 PTA of her left pulmonary artery 08/2018 Echo: EF >55%, dilated PA -Followed by [facility]; has been stable  -Continue ASA '81mg'$  daily -Continue imdur '120mg'$  BID -Cath at [facility] in 2020 with no significant CAD  #Takayatsu Arteritis: #RA Followed by Rheum. On MTX. -Follow-up with rheum as scheduled -Continue MTX  #HLD: LDL 170 on last check. Previously 185. Hesitant to start statin therapy but given degree of LDL elevation, agree that she would benefit from cholesterol lowering therapy. Will check NMR panel, Lp(a) and apolipoprotein B. She is willing to trial crestor and monitor response. May ultimately need PCSK9i if cannot tolerate statins. -Will trial crestor '10mg'$  daily -Check NMR, Lp(a) and apolipoprotein B -Refer to lipid clinic as may ultimately need PCSK9i   Follow up: 6 months with APP.   Medication Adjustments/Labs and Tests Ordered: Current medicines are reviewed at length with the patient today.  Concerns regarding  medicines are outlined above.  Orders Placed This Encounter  Procedures   NMR, lipoprofile   Lipoprotein A (LPA)   Apolipoprotein B   AMB Referral to [facility] Pharm-D   Meds ordered this encounter  Medications   rosuvastatin (CRESTOR) 10 MG tablet    Sig: Take 1 tablet (10 mg total) by mouth daily.    Dispense:  90 tablet    Refill:  1    Patient Instructions  Medication Instructions:   START TAKING ROSUVASTATIN (CRESTOR) 10 MG BY MOUTH DAILY  *If you need a refill on your cardiac medications before your next appointment, please call your pharmacy*   You have been referred to [place]    Lab Work:  NEXT WEEK-NMR LIPOPROFILE, LIPOPROTEIN A, AND APOLIPOPROTEIN B--PLEASE COME FASTING  If you have labs (blood work) drawn today and your tests are completely normal, you will receive your results only by: [employer] (if you have MyChart) OR A paper copy in the mail If you have any lab test that is abnormal or we need to change your treatment, we will call you to review the results.   Follow-Up: At [facility], you and your health needs are our priority.  As part of our continuing mission to provide you with  exceptional heart care, we have created designated Provider Care Teams.  These Care Teams include your primary Cardiologist (physician) and Advanced Practice Providers (APPs -  Physician Assistants and Nurse Practitioners) who all work together to provide you with the care you need, when you need it.  We recommend signing up for the patient portal called "MyChart".  Sign up information is provided on this After Visit Summary.  MyChart is used to connect with patients for Virtual Visits (Telemedicine).  Patients are able to view lab/test results, encounter notes, upcoming appointments, etc.  Non-urgent messages can be sent to your provider as well.   To learn more about what you can do with  MyChart, go to [email or web address].    Your next appointment:   6 month(s)  The format for your next appointment:   In Person  Provider:   Freada Bergeron, MD      Important Information About Sugar        I,Breanna Adamick,acting as a scribe for Freada Bergeron, MD.,have documented all relevant documentation on the behalf of Freada Bergeron, MD,as directed by  Freada Bergeron, MD while in the presence of Freada Bergeron, MD.  I, Freada Bergeron, MD, have reviewed all documentation for this visit. The documentation on 10/31/22 for the exam, diagnosis, procedures, and orders are all accurate and complete.   Signed, Freada Bergeron, MD  10/31/2022 11:16 AM    [place]

## 2022-11-05 ENCOUNTER — Ambulatory Visit: Payer: BC Managed Care – PPO | Attending: Cardiology

## 2022-11-05 ENCOUNTER — Encounter: Payer: Self-pay | Admitting: Obstetrics & Gynecology

## 2022-11-05 ENCOUNTER — Ambulatory Visit (INDEPENDENT_AMBULATORY_CARE_PROVIDER_SITE_OTHER): Payer: BC Managed Care – PPO | Admitting: Obstetrics & Gynecology

## 2022-11-05 VITALS — BP 110/74 | HR 77

## 2022-11-05 DIAGNOSIS — Z79899 Other long term (current) drug therapy: Secondary | ICD-10-CM

## 2022-11-05 DIAGNOSIS — Z09 Encounter for follow-up examination after completed treatment for conditions other than malignant neoplasm: Secondary | ICD-10-CM

## 2022-11-05 DIAGNOSIS — E782 Mixed hyperlipidemia: Secondary | ICD-10-CM

## 2022-11-05 NOTE — Progress Notes (Signed)
    CASSI JENNE 30-Jun-1976 [id number]        46 y.o.  G0P0000   RP: Postop XI Robotic TLH/BS 4 weeks ago  HPI:  XI Robotic TLH/BS 4 weeks ago on 10/08/22.  Slowed her physical activities after last visit for vaginal bleeding on 10/24/22.  No longer having vaginal bleeding.  No abdominopelvic pain.  No fever.  Urine/BMs normal.   OB History  Gravida Para Term Preterm AB Living  0 0 0 0 0 0  SAB IAB Ectopic Multiple Live Births  0 0 0 0    Obstetric Comments  1st Menstrual Cycle:  12    Past medical history,surgical history, problem list, medications, allergies, family history and social history were all reviewed and documented in the EPIC chart.   Directed ROS with pertinent positives and negatives documented in the history of present illness/assessment and plan.  Exam:  Vitals:   11/05/22 1016  BP: 110/74  Pulse: 77  SpO2: 98%   General appearance:  Normal  Abdomen: Incisions well healed.  Suture material trimmed.  Gynecologic exam: Vulva normal.  Speculum:  Vaginal vault well closed.  No active bleeding.  Mild brownish secretions.     Assessment/Plan:  46 y.o. G0P0000   1. Status post gynecological surgery, follow-up exam  XI Robotic TLH/BS 4 weeks ago on 10/08/22.  Slowed her physical activities after last visit for vaginal bleeding on 10/24/22.  No longer having vaginal bleeding.  No abdominopelvic pain.  No fever.  Urine/BMs normal. Normal postop exam.  No IC until 8 weeks postop.  F/U vaginal vault check 11/28/22.  Princess Bruins MD, 10:41 AM 11/05/2022

## 2022-11-06 LAB — NMR, LIPOPROFILE
Cholesterol, Total: 274 mg/dL — ABNORMAL HIGH (ref 100–199)
HDL Particle Number: 37.7 umol/L (ref >=30.5)
HDL-C: 83 mg/dL (ref >39)
LDL Particle Number: 1382 nmol/L — ABNORMAL HIGH (ref <1000)
LDL Size: 22.5 nm (ref >20.5)
LDL-C (NIH Calc): 173 mg/dL — ABNORMAL HIGH (ref 0–99)
LP-IR Score: 34 (ref <=45)
Small LDL Particle Number: <90 nmol/L (ref <=527)
Triglycerides: 104 mg/dL (ref 0–149)

## 2022-11-06 LAB — APOLIPOPROTEIN B: Apolipoprotein B: 106 mg/dL — ABNORMAL HIGH (ref <90)

## 2022-11-06 LAB — LIPOPROTEIN A (LPA): Lipoprotein (a): 216.3 nmol/L — ABNORMAL HIGH (ref <75.0)

## 2022-11-10 ENCOUNTER — Telehealth: Payer: Self-pay | Admitting: *Deleted

## 2022-11-10 ENCOUNTER — Ambulatory Visit: Payer: BC Managed Care – PPO

## 2022-11-10 ENCOUNTER — Ambulatory Visit: Payer: BC Managed Care – PPO | Attending: Cardiovascular Disease | Admitting: Pharmacist

## 2022-11-10 DIAGNOSIS — E782 Mixed hyperlipidemia: Secondary | ICD-10-CM

## 2022-11-10 MED ORDER — ROSUVASTATIN CALCIUM 5 MG PO TABS: 5.0000 mg | ORAL_TABLET | Freq: Every day | ORAL | 11 refills | Status: DC

## 2022-11-10 NOTE — Telephone Encounter (Signed)
The patient has been notified of the result and verbalized understanding.  All questions (if any) were answered.  Pt states she is not tolerating crestor at all.  She states this med is causing her muscle and joint aches.   Pt was referred to lipid clinic and last OV and now has an appt with them today 11/27 at 1:30 pm, at our [address] location.  This appt was moved up from her prior appt on 12/28 with lipid clinic at our [place] office.  Pt aware that today's appt will be at our [address] location and to arrive 15 mins prior to that appt.   Discontinued crestor from her med list and updated this in her allergies as an intolerance.   Pt verbalized understanding and agrees with this plan.

## 2022-11-10 NOTE — Progress Notes (Unsigned)
Patient ID: Kara Anderson                 DOB: 1976-02-02                    MRN: [id number]      HPI: Kara Anderson is a 46 y.o. female patient referred to lipid clinic by Dr. Johney Frame. 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 She has been seen for chest pain in the past and diagnosed after work-up at [facility] with microvascular dysfunction. MR angio of the chest in 2018 showed evidence of large vessel vasculitis/arteritis. CT chest in 12/2021 showed aortic atherosclerosis. NM PET CT with perfusion 08/2022 which showed normal EF of 58% and normal MBF that responded appropriately to stress. Patient was last seen by Dr. Johney Frame on 10/31/22 and was started on rosuvastatin 10 mg daily, however the patient voiced hesitancy towards statin therapy given baseline arthralgia. Labs the following week showed LP(a), ApoB, LDL, and particle number were all elevated on 11/05/22.   Patient presents today in good spirits. She reports stopping rosuvastatin 11/26 due to worsening of her inflammatory arthritis. She is unable to determine whether onset of pain was right after starting rosuvastatin or delayed, however it has not remitted in the single day since stopping rosuvastatin. Notably, she is on itraconazole which can increase the adverse effects of rosuvastatin. She can not distinguish whether she is having an acute flare of her inflammatory arthritis or is experiencing statin associated arthralgia. She denies any large muscle pain. She reports adhering to the mediterranean diet strictly and exercises frequently. She had multiple questions regarding the pathophysiology of hyperlipidemia, her recent advanced lipid panels, and the relation between her microvascular chest pain and cholesterol. No history of premature heart disease noted, however grandfather did suffer MI at an  unknown age.  Current Medications: none Intolerances: rosuvastatin 10 mg daily Risk Factors: Aortic atherosclerosis, arteritis  LDL goal: <70 mg/dL  Diet: Patient adheres strictly to the mediterranean diet  Exercise: Daily aerobic exercise, >150 minutes per week  Family History:  Family History  Problem Relation Age of Onset   Heart disease Mother    Stroke Mother    Diabetes Mother    Brain cancer Mother 40       glioblastoma   Heart disease Father    Alcohol abuse Father    Alcoholism Father    Other Sister        FAP   Colonic polyp Sister 30       adenomatous polyp   Breast cancer Maternal Aunt    Lung cancer Paternal Uncle 15   Other Paternal Uncle 25       MVA   Pancreatic disease Maternal Grandmother    Stomach cancer Maternal Grandmother    Colon cancer Maternal Grandmother    Heart attack Paternal Grandfather    Leukemia Cousin 12   Breast cancer Cousin 15       neg GT   Esophageal cancer Neg Hx    Liver disease Neg Hx    Rectal cancer Neg Hx     Social History:  Social History   Socioeconomic History   Marital status: Married    Spouse name: Not on file   Number of children: Not on file   Years of education: Not on file   Highest education level: Not on  file  Occupational History   Not on file  Tobacco Use   Smoking status: Never    Passive exposure: Never   Smokeless tobacco: Never  Vaping Use   Vaping Use: Never used  Substance and Sexual Activity   Alcohol use: No    Alcohol/week: 0.0 standard drinks of alcohol   Drug use: No   Sexual activity: Not Currently    Partners: Male    Birth control/protection: None  Other Topics Concern   Not on file  Social History Narrative   Married for 12 years.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   Social Determinants of Health   Financial Resource Strain: Not on file  Food Insecurity: Not on file  Transportation Needs: Not on file  Physical Activity: Not on file   Stress: Not on file  Social Connections: Not on file  Intimate Partner Violence: Not on file    Labs:  11/05/22 on  ApoB 106, LP(a) 216.3, LDL particle number 1382, LDL 173, HDLc 83, TG 104, TC 274, HDL particle number, small LDL particles <90, 37.7, LDL size 22.5  10/01/21 on no background therapy: LDL 185, HDL 94, TG 117, TC 302  Past Medical History:  Diagnosis Date   Allergy    Anemia    d/t medications per patient   Anxiety    Bronchitis 08/20/2022   micropurulent and recurrent, pt started on Augmentin 08/20/22, see OV w/ Dr. Adrian Prows on 08/20/22.   Decreased libido 09/29/2019   Depression    Endometriosis 2007,2009   Family history of brain cancer    Family history of breast cancer    Family history of colon cancer    Family history of pancreatic cancer    Farmer's lung ([place]) 08/20/2022   Pt is being treated with Trelegy inhaler and Itraconazole.   GERD (gastroesophageal reflux disease)    Heart murmur    pt reports she no longer has this d/t control of her vasculitis   HLD (hyperlipidemia) 09/29/2019   Hypersensitivity pneumonitis ([place])    As of 08/20/22, pt is being treated w/ Itraconazole. Pt follws with pulmonologist, Dr. Ottie Glazier @ [place], [place] 06/18/22. Pt had abnormal chest imaging w/ ground glass infiltrates.   Large vessel vasculitis ([place])    Follwed by Dr. Valeta Harms @ [facility], [place] 07/02/22. Pt is being treated with infusions of infliximab.   Malaise and fatigue 09/29/2019   Mediastinal adenopathy    Microvascular angina    microvascular dysfunction and chronic angina, follows w/ Dr. Johney Frame at [facility], LOV 06/27/22 as of 08/20/22, Patient is currently on Ranexa, Imdur, NTG patch and sublingual NTG prn., 05/29/22 Echo in [place] showed EF 65 - 70%, 08/19/22 NM PET CT Cardiac Perfusion in Epic showed no evidence of ischemia   Migraines 02/2022   Daily HA w/ constant pressure, bi-temporal pain, photophobia, phonophobia and nausea.  07/27/22 MRI brain showed a normal appearance. Pt started on Topamax and Nurtec on 07/29/22. See 07/29/22 OV w/ neurologist Dr. Lurena Nida @ [place].   Mononucleosis    Pneumonia    PONV (postoperative nausea and vomiting)    Pulmonary nodules 12/13/2021   tiny 2 -3 mm nodules per 12/13/21 Chest CT in Epic done for persistent cough and night sweats   Seasonal allergies    Seronegative rheumatoid arthritis ([place])    Followed by Dr. Ula Lingo at [place], [place] 07/02/22. Pt receives infusions of Avsola, last infusion 07/12/22 as of 08/21/22.  Status post pulmonary artery branches stent placement    left main pulmonary artery stent   Strep throat    Takayasu's arteritis ([place])    Followed by Dr. Ula Lingo at [place], [place] 07/02/22 in [place].   Thyroid disease    hyothyroidism   Viral meningitis    Vitamin D deficiency disease 09/29/2019    Current Outpatient Medications on File Prior to Visit  Medication Sig Dispense Refill   acidophilus (RISAQUAD) CAPS capsule Take 1 capsule by mouth daily.     albuterol (VENTOLIN HFA) 108 (90 Base) MCG/ACT inhaler Inhale 1-2 puffs into the lungs every 6 (six) hours as needed for shortness of breath or wheezing.     amitriptyline (ELAVIL) 10 MG tablet Take 30 mg by mouth at bedtime.     amLODipine (NORVASC) 2.5 MG tablet Take 1 tablet (2.5 mg total) by mouth daily. 90 tablet 3   aspirin 81 MG EC tablet Take 81 mg by mouth daily.     celecoxib (CELEBREX) 200 MG capsule Take 200 mg by mouth 2 (two) times daily.     Cholecalciferol (VITAMIN D-3) 125 MCG (5000 UT) TABS Take 5,000 Units by mouth every other day.     Coenzyme Q10 (CO Q 10 PO) Take 250 mg by mouth at bedtime.     Fluticasone-Umeclidin-Vilant (TRELEGY ELLIPTA) 200-62.5-25 MCG/ACT AEPB Inhale 1 Inhalation into the lungs daily.     gabapentin (NEURONTIN) 300 MG capsule Take 600-900 mg by mouth See admin instructions. Take 600 mg by mouth in the morning, 600 mg at noon and 900  mg at bedtime     inFLIXimab-axxq (AVSOLA) 100 MG injection Inject 100 mg into the vein every 2 (two) months.     isosorbide mononitrate (IMDUR) 120 MG 24 hr tablet Take 1 tablet (120 mg total) by mouth 2 (two) times daily. 180 tablet 3   itraconazole (SPORANOX) 100 MG capsule Take 100 mg by mouth daily.     MAGNESIUM PO Take 400 mg by mouth at bedtime.     Multiple Vitamins-Minerals (MULTIVITAMIN ADULT) CHEW Chew 2 tablets by mouth daily.     nitroGLYCERIN (NITRODUR - DOSED IN MG/24 HR) 0.3 mg/hr patch Place 1 patch (0.3 mg total) onto the skin daily. 30 patch 12   nitroGLYCERIN (NITROSTAT) 0.4 MG SL tablet PLACE 1 TABLET UNDER TONGUE EVERY 5 MINUTES AS NEEDED FOR CHEST PAIN 25 tablet 1   NURTEC 75 MG TBDP Take 75 mg by mouth daily as needed (migraine).     omeprazole (PRILOSEC) 40 MG capsule Take 40 mg by mouth daily.     ondansetron (ZOFRAN-ODT) 8 MG disintegrating tablet Take 8 mg by mouth every 8 (eight) hours as needed for nausea or vomiting.     predniSONE (DELTASONE) 5 MG tablet Take 5 mg by mouth daily with breakfast.     ranolazine (RANEXA) 500 MG 12 hr tablet Take 2 tablets (1,000 mg total) by mouth 2 (two) times daily. 360 tablet 2   VITAMIN E PO Take 1 capsule by mouth every other day.     No current facility-administered medications on file prior to visit.    Allergies  Allergen Reactions   Codeine Nausea Only   Crestor [Rosuvastatin] Other (See Comments)    Pt reports causes joint and muscle aches    Assessment/Plan:  1. Hyperlipidemia - Most recent LDL of 173 is above goal of <70 on no background therapy. Patient has had increased arthralgia since taking the rosuvastatin and discontinued it  herself. Itraconazole may be contributing, but that is set to stop in the next couple months. She was thoroughly educated on the results of her NMR panel and the benefits of primary prevention of ASCVD. She appears to have ideal diet and exercise habits. Given her symptoms and comorbid  inflammatory arthritis, will stop rosuvastatin for 7 days to assess for symptom resolution. After that washout, will rechallenge with rosuvastatin 5 mg daily. Given her elevated LP(a) and grossly elevated LDL, she will likely need further lipid lowering medication with PCSK9 inhibitor. Will see the plan requirement for initiation and reach back out to Ms. Gross next week with findings and check in on her arthralgia/tolerance to low dose rosuvastatin. Encouraged to continue excellent diet and exercise habits.  Thank you for allowing pharmacy to participate in this patient's care.  Reatha Harps, PharmD PGY2 Pharmacy Resident 11/10/2022 12:22 PM Check [email or web address] for unit specific pharmacy number

## 2022-11-10 NOTE — Progress Notes (Unsigned)
Patient ID: Kara Anderson                 DOB: [birth year]                    MRN: [id number]      HPI: Kara Anderson is a 46 y.o. female patient referred to lipid clinic by Dr. Johney Frame. PMH is significant for hx of takayasu arteritis followed by Rheum on MTX, pulmonary stenosis s/p stenting, HLD and microvascular dysfunction with chronic angina.   Current Medications:  Intolerances:  Risk Factors:  LDL goal:   Diet:   Exercise:   Family History:   Social History:   Labs:  Past Medical History:  Diagnosis Date   Allergy    Anemia    d/t medications per patient   Anxiety    Bronchitis 08/20/2022   micropurulent and recurrent, pt started on Augmentin 08/20/22, see OV w/ Dr. Adrian Prows on 08/20/22.   Decreased libido 09/29/2019   Depression    Endometriosis 2007,2009   Family history of brain cancer    Family history of breast cancer    Family history of colon cancer    Family history of pancreatic cancer    Farmer's lung ([place]) 08/20/2022   Pt is being treated with Trelegy inhaler and Itraconazole.   GERD (gastroesophageal reflux disease)    Heart murmur    pt reports she no longer has this d/t control of her vasculitis   HLD (hyperlipidemia) 09/29/2019   Hypersensitivity pneumonitis ([place])    As of 08/20/22, pt is being treated w/ Itraconazole. Pt follws with pulmonologist, Dr. Ottie Glazier @ [place], [place] 06/18/22. Pt had abnormal chest imaging w/ ground glass infiltrates.   Large vessel vasculitis ([place])    Follwed by Dr. Valeta Harms @ [facility], [place] 07/02/22. Pt is being treated with infusions of infliximab.   Malaise and fatigue 09/29/2019   Mediastinal adenopathy    Microvascular angina    microvascular dysfunction and chronic angina, follows w/ Dr. Johney Frame at [facility], LOV 06/27/22 as of 08/20/22, Patient is currently on Ranexa, Imdur, NTG patch and sublingual NTG prn., 05/29/22 Echo in [place] showed EF 65 - 70%, 08/19/22 NM PET CT Cardiac  Perfusion in Epic showed no evidence of ischemia   Migraines 02/2022   Daily HA w/ constant pressure, bi-temporal pain, photophobia, phonophobia and nausea. 07/27/22 MRI brain showed a normal appearance. Pt started on Topamax and Nurtec on 07/29/22. See 07/29/22 OV w/ neurologist Dr. Lurena Nida @ [place].   Mononucleosis    Pneumonia    PONV (postoperative nausea and vomiting)    Pulmonary nodules 12/13/2021   tiny 2 -3 mm nodules per 12/13/21 Chest CT in Epic done for persistent cough and night sweats   Seasonal allergies    Seronegative rheumatoid arthritis ([place])    Followed by Dr. Ula Lingo at [place], [place] 07/02/22. Pt receives infusions of Avsola, last infusion 07/12/22 as of 08/21/22.   Status post pulmonary artery branches stent placement    left main pulmonary artery stent   Strep throat    Takayasu's arteritis ([place])    Followed by Dr. Ula Lingo at [place], [place] 07/02/22 in [place].   Thyroid disease    hyothyroidism   Viral meningitis    Vitamin D deficiency disease 09/29/2019    Current Outpatient Medications on File Prior to Visit  Medication Sig Dispense Refill   acidophilus (RISAQUAD) CAPS capsule Take 1  capsule by mouth daily.     albuterol (VENTOLIN HFA) 108 (90 Base) MCG/ACT inhaler Inhale 1-2 puffs into the lungs every 6 (six) hours as needed for shortness of breath or wheezing.     amitriptyline (ELAVIL) 10 MG tablet Take 30 mg by mouth at bedtime.     amLODipine (NORVASC) 2.5 MG tablet Take 1 tablet (2.5 mg total) by mouth daily. 90 tablet 3   aspirin 81 MG EC tablet Take 81 mg by mouth daily.     celecoxib (CELEBREX) 200 MG capsule Take 200 mg by mouth 2 (two) times daily.     Cholecalciferol (VITAMIN D-3) 125 MCG (5000 UT) TABS Take 5,000 Units by mouth every other day.     Coenzyme Q10 (CO Q 10 PO) Take 250 mg by mouth at bedtime.     Fluticasone-Umeclidin-Vilant (TRELEGY ELLIPTA) 200-62.5-25 MCG/ACT AEPB Inhale 1 Inhalation into the lungs  daily.     gabapentin (NEURONTIN) 300 MG capsule Take 600-900 mg by mouth See admin instructions. Take 600 mg by mouth in the morning, 600 mg at noon and 900 mg at bedtime     inFLIXimab-axxq (AVSOLA) 100 MG injection Inject 100 mg into the vein every 2 (two) months.     isosorbide mononitrate (IMDUR) 120 MG 24 hr tablet Take 1 tablet (120 mg total) by mouth 2 (two) times daily. 180 tablet 3   itraconazole (SPORANOX) 100 MG capsule Take 100 mg by mouth daily.     MAGNESIUM PO Take 400 mg by mouth at bedtime.     Multiple Vitamins-Minerals (MULTIVITAMIN ADULT) CHEW Chew 2 tablets by mouth daily.     nitroGLYCERIN (NITRODUR - DOSED IN MG/24 HR) 0.3 mg/hr patch Place 1 patch (0.3 mg total) onto the skin daily. 30 patch 12   nitroGLYCERIN (NITROSTAT) 0.4 MG SL tablet PLACE 1 TABLET UNDER TONGUE EVERY 5 MINUTES AS NEEDED FOR CHEST PAIN 25 tablet 1   NURTEC 75 MG TBDP Take 75 mg by mouth daily as needed (migraine).     omeprazole (PRILOSEC) 40 MG capsule Take 40 mg by mouth daily.     ondansetron (ZOFRAN-ODT) 8 MG disintegrating tablet Take 8 mg by mouth every 8 (eight) hours as needed for nausea or vomiting.     predniSONE (DELTASONE) 5 MG tablet Take 5 mg by mouth daily with breakfast.     ranolazine (RANEXA) 500 MG 12 hr tablet Take 2 tablets (1,000 mg total) by mouth 2 (two) times daily. 360 tablet 2   VITAMIN E PO Take 1 capsule by mouth every other day.     No current facility-administered medications on file prior to visit.    Allergies  Allergen Reactions   Codeine Nausea Only   Crestor [Rosuvastatin] Other (See Comments)    Pt reports causes joint and muscle aches    Assessment/Plan:  1. Hyperlipidemia -   No problem-specific Assessment & Plan notes found for this encounter.  '@MTPCOMPLETEDLIST'$ @    Thank you,   Ramond Dial, Pharm.D, BCPS, CPP [place]  [address], [place], [place] [address]  Phone: [phone]; Fax: [phone]

## 2022-11-10 NOTE — Telephone Encounter (Signed)
-----   Message from Freada Bergeron, MD sent at 11/09/2022  7:22 PM EST ----- Her cholesterol is elevated with LDL 173. She also has a high Lp(a) and apolipoprotein B. How is she doing on the crestor. She definitely needs cholesterol lowering therapy and if she is unable to tolerate the statins, she would be a good candidate for PCSK9i. We already referred to lipid clinic which is great.

## 2022-11-10 NOTE — Patient Instructions (Addendum)
Stop rosuvastatin for 7 days. Start back at 1/2 tablet (5 mg) daily. Call [phone] if you have any returning side effects after restarting.  I will submit information to your insurance for Repatha and let you know when I hear back.    Repatha is a subcutaneous injection given once every 2 weeks in the fatty tissue of your stomach or upper outer thigh. Store the medication in the fridge. You can let your dose warm up to room temperature for 30 minutes before injecting if you prefer. Repatha will lower your LDL cholesterol by 60% and helps to lower your chance of having a heart attack or stroke.

## 2022-11-13 ENCOUNTER — Telehealth (HOSPITAL_COMMUNITY): Payer: Self-pay | Admitting: Pharmacist

## 2022-11-13 DIAGNOSIS — E782 Mixed hyperlipidemia: Secondary | ICD-10-CM

## 2022-11-13 NOTE — Telephone Encounter (Addendum)
Attempted call to discuss tolerability of statin with no response. Based on insurance PA form, patient will likely need to try rosuvastatin and atorvastatin before the PA is approved.

## 2022-11-18 NOTE — Telephone Encounter (Signed)
Patient called and LVM to follow up. I called pt back and LVM for her to call back. Need to follow up on tolerability of rosuvastatin. If she does not tolerate, will need to try atorvastatin per her insurance. Is she is tolerating, can submit for PCSK9i as on max tolerated dose of rosuvastatin.

## 2022-11-19 ENCOUNTER — Encounter: Payer: BC Managed Care – PPO | Admitting: Obstetrics & Gynecology

## 2022-11-20 NOTE — Addendum Note (Signed)
Addended by: Marcelle Overlie D on: 11/20/2022 04:36 PM   Modules accepted: Orders

## 2022-11-20 NOTE — Telephone Encounter (Signed)
Spoke to patient. She restarted rosuvastatin '5mg'$  3 days ago. She will let us know next week how she is doing. Her insurance asks about trial of both rosuvastatin and atorvastatin. If she doesn't tolerate rosuvastatin she will need to try atorvastatin. If she tolerates rosuvastatin at a lower dose, will submit PA. Can always appeal if denied. Pt will come for repeat labs 12/28 if she is doing well on rosuvastatin. She is still on itraconazole so lower dose of rosuvastatin is being used She will call next week to let us know how she is doing.

## 2022-11-28 ENCOUNTER — Encounter: Payer: Self-pay | Admitting: Obstetrics & Gynecology

## 2022-11-28 ENCOUNTER — Ambulatory Visit (INDEPENDENT_AMBULATORY_CARE_PROVIDER_SITE_OTHER): Payer: BC Managed Care – PPO | Admitting: Obstetrics & Gynecology

## 2022-11-28 VITALS — BP 116/70 | HR 84

## 2022-11-28 DIAGNOSIS — Z09 Encounter for follow-up examination after completed treatment for conditions other than malignant neoplasm: Secondary | ICD-10-CM

## 2022-11-28 NOTE — Progress Notes (Signed)
    Kara Anderson 1976-05-02 [id number]        46 y.o.  G0P0000   RP: Postop Robotic TLH/BS on 10/08/22  HPI: Very good healing.  Skin incisions all closed, no redness, no discharge, non-tender.  Very mildly tinted browning/pinking vaginal discharge.  No itching, no odor.  No abdominopelvic pain.  No fever.  Urine/BMs normal.   OB History  Gravida Para Term Preterm AB Living  0 0 0 0 0 0  SAB IAB Ectopic Multiple Live Births  0 0 0 0    Obstetric Comments  1st Menstrual Cycle:  12    Past medical history,surgical history, problem list, medications, allergies, family history and social history were all reviewed and documented in the EPIC chart.   Directed ROS with pertinent positives and negatives documented in the history of present illness/assessment and plan.  Exam:  Vitals:   11/28/22 1523  BP: 116/70  Pulse: 84  SpO2: 98%   General appearance:  Normal  Abdomen: Incisions well closed, no erythema or induration.  Gynecologic exam: Vulva normal.  Speculum:  Vaginal vault well closed. No erythema.  Mild vaginal secretions, brownish-pinkish.  No active bleeding.   Assessment/Plan:  46 y.o. G0P0000   1. Status post gynecological surgery, follow-up exam  Very good healing.  Skin incisions all closed, no redness, no discharge, non-tender.  Very mildly tinted browning/pinking vaginal discharge.  No itching, no odor.  No abdominopelvic pain.  No fever.  Urine/BMs normal.  Normal gyn exam with good healing 7 weeks postop.  Recommend to wait until 1 week without any pink/brownish discharge before resuming sexual activity.  Princess Bruins MD, 3:44 PM 11/28/2022

## 2022-12-02 ENCOUNTER — Telehealth: Payer: Self-pay | Admitting: Pharmacist

## 2022-12-02 MED ORDER — ATORVASTATIN CALCIUM 10 MG PO TABS: 10.0000 mg | ORAL_TABLET | Freq: Every day | ORAL | 3 refills | Status: DC

## 2022-12-02 NOTE — Telephone Encounter (Signed)
Called called in as a follow up. States her joint pain better after stopping rouvastatin '10mg'$  and with steriod tapper. She now has back pain where he back feels tight like it needs to be stretched all the time. She denies any increase in exercise. Will STOP rosuvastatin. Wait up to a week or so to see if symptoms improve then start atorvastatin '10mg'$  daily. Cancel dec labs. Patient will call in 2 weeks with update.

## 2022-12-04 ENCOUNTER — Other Ambulatory Visit: Payer: Self-pay

## 2022-12-04 MED ORDER — RANOLAZINE ER 500 MG PO TB12: 1000.0000 mg | ORAL_TABLET | Freq: Two times a day (BID) | ORAL | 3 refills | Status: DC

## 2022-12-07 ENCOUNTER — Encounter: Payer: Self-pay | Admitting: Obstetrics & Gynecology

## 2022-12-11 ENCOUNTER — Other Ambulatory Visit: Payer: BC Managed Care – PPO

## 2022-12-11 ENCOUNTER — Ambulatory Visit: Payer: BC Managed Care – PPO

## 2022-12-16 ENCOUNTER — Telehealth: Payer: Self-pay | Admitting: Pharmacist

## 2022-12-16 DIAGNOSIS — E782 Mixed hyperlipidemia: Secondary | ICD-10-CM

## 2022-12-16 MED ORDER — REPATHA SURECLICK 140 MG/ML ~~LOC~~ SOAJ: 1.0000 mL | SUBCUTANEOUS | 11 refills | Status: DC

## 2022-12-16 NOTE — Telephone Encounter (Signed)
Patient called to report that her back pain/achiness went away after 3-4 days off rosuvastatin.  She waited a few days and then started atorvastatin.  But her pain came back on atorvastatin. Stop taking atorvastatin 3 days ago. Achiness went away.  Will submit prior authorization for Repatha now the patient has tried both atorvastatin and rosuvastatin low doses and has had side effects.  Key: [id number] Approved through 12/15/23

## 2022-12-17 NOTE — Telephone Encounter (Signed)
Per ML: "If she is not bleeding, no need to come tomorrow.  Schedule only if problems when back from the beach"  Pt notified and voiced understanding. Appt cancelled for tomorrow.

## 2022-12-17 NOTE — Telephone Encounter (Signed)
Pt left msg in triage box stating she is no longer bleeding and is wondering if still needs to keep appt for tomorrow 12/18/2022.  Please advise.

## 2022-12-18 ENCOUNTER — Ambulatory Visit: Payer: BC Managed Care – PPO | Admitting: Obstetrics & Gynecology

## 2022-12-26 ENCOUNTER — Other Ambulatory Visit: Payer: Self-pay | Admitting: Pulmonary Disease

## 2022-12-26 DIAGNOSIS — R0602 Shortness of breath: Secondary | ICD-10-CM

## 2023-01-05 ENCOUNTER — Encounter: Payer: Self-pay | Admitting: Pharmacist

## 2023-01-07 ENCOUNTER — Ambulatory Visit: Payer: BC Managed Care – PPO | Attending: Pulmonary Disease

## 2023-01-07 DIAGNOSIS — R0602 Shortness of breath: Secondary | ICD-10-CM | POA: Diagnosis not present

## 2023-01-07 LAB — PULMONARY FUNCTION TEST ARMC ONLY
FEF 25-75 Post: 3.85 L/sec
FEF 25-75 Pre: 4.2 L/sec
FEF2575-%Change-Post: -8 %
FEF2575-%Pred-Post: 131 %
FEF2575-%Pred-Pre: 144 %
FEV1-%Change-Post: -2 %
FEV1-%Pred-Post: 99 %
FEV1-%Pred-Pre: 102 %
FEV1-Post: 2.89 L
FEV1-Pre: 2.97 L
FEV1FVC-%Change-Post: 0 %
FEV1FVC-%Pred-Pre: 108 %
FEV6-%Change-Post: -3 %
FEV6-%Pred-Post: 92 %
FEV6-%Pred-Pre: 95 %
FEV6-Post: 3.26 L
FEV6-Pre: 3.38 L
FEV6FVC-%Pred-Post: 102 %
FEV6FVC-%Pred-Pre: 102 %
FVC-%Change-Post: -3 %
FVC-%Pred-Post: 90 %
FVC-%Pred-Pre: 93 %
FVC-Post: 3.26 L
FVC-Pre: 3.38 L
Post FEV1/FVC ratio: 89 %
Post FEV6/FVC ratio: 100 %
Pre FEV1/FVC ratio: 88 %
Pre FEV6/FVC Ratio: 100 %

## 2023-01-07 MED ORDER — ALBUTEROL SULFATE (2.5 MG/3ML) 0.083% IN NEBU
2.5000 mg | INHALATION_SOLUTION | Freq: Once | RESPIRATORY_TRACT | Status: AC
  Administered 2023-01-07: 2.5 mg via RESPIRATORY_TRACT
  Filled 2023-01-07: qty 3

## 2023-01-07 MED ORDER — METHACHOLINE 1 MG/ML NEB SOLN
3.0000 mL | Freq: Once | RESPIRATORY_TRACT | Status: AC
  Administered 2023-01-07: 3 mg via RESPIRATORY_TRACT
  Filled 2023-01-07: qty 3

## 2023-01-07 MED ORDER — METHACHOLINE 0 MG/ML NEB SOLN
3.0000 mL | Freq: Once | RESPIRATORY_TRACT | Status: AC
  Administered 2023-01-07: 3 mL via RESPIRATORY_TRACT
  Filled 2023-01-07: qty 3

## 2023-01-07 MED ORDER — METHACHOLINE 4 MG/ML NEB SOLN
3.0000 mL | Freq: Once | RESPIRATORY_TRACT | Status: AC
  Administered 2023-01-07: 12 mg via RESPIRATORY_TRACT
  Filled 2023-01-07: qty 3

## 2023-01-07 MED ORDER — METHACHOLINE 0.0625 MG/ML NEB SOLN
3.0000 mL | Freq: Once | RESPIRATORY_TRACT | Status: AC
  Administered 2023-01-07: 0.1875 mg via RESPIRATORY_TRACT
  Filled 2023-01-07: qty 3

## 2023-01-07 MED ORDER — METHACHOLINE 16 MG/ML NEB SOLN
3.0000 mL | Freq: Once | RESPIRATORY_TRACT | Status: AC
  Administered 2023-01-07: 48 mg via RESPIRATORY_TRACT
  Filled 2023-01-07: qty 3

## 2023-01-07 MED ORDER — METHACHOLINE 0.25 MG/ML NEB SOLN
3.0000 mL | Freq: Once | RESPIRATORY_TRACT | Status: AC
  Administered 2023-01-07: 0.75 mg via RESPIRATORY_TRACT
  Filled 2023-01-07: qty 3

## 2023-01-14 ENCOUNTER — Other Ambulatory Visit: Payer: Self-pay | Admitting: Infectious Diseases

## 2023-01-14 DIAGNOSIS — J411 Mucopurulent chronic bronchitis: Secondary | ICD-10-CM

## 2023-01-14 DIAGNOSIS — B488 Other specified mycoses: Secondary | ICD-10-CM

## 2023-01-14 DIAGNOSIS — J670 Farmer's lung: Secondary | ICD-10-CM

## 2023-01-14 DIAGNOSIS — M314 Aortic arch syndrome [Takayasu]: Secondary | ICD-10-CM

## 2023-01-14 DIAGNOSIS — M316 Other giant cell arteritis: Secondary | ICD-10-CM

## 2023-01-21 ENCOUNTER — Ambulatory Visit
Admission: RE | Admit: 2023-01-21 | Discharge: 2023-01-21 | Disposition: A | Discharge status: Home / Self Care | Payer: BC Managed Care – PPO | Source: Ambulatory Visit | Attending: Infectious Diseases | Admitting: Infectious Diseases

## 2023-01-21 DIAGNOSIS — B488 Other specified mycoses: Secondary | ICD-10-CM

## 2023-01-21 DIAGNOSIS — M314 Aortic arch syndrome [Takayasu]: Secondary | ICD-10-CM

## 2023-01-21 DIAGNOSIS — J411 Mucopurulent chronic bronchitis: Secondary | ICD-10-CM

## 2023-01-21 DIAGNOSIS — J670 Farmer's lung: Secondary | ICD-10-CM

## 2023-01-21 DIAGNOSIS — M316 Other giant cell arteritis: Secondary | ICD-10-CM

## 2023-01-23 ENCOUNTER — Other Ambulatory Visit (HOSPITAL_BASED_OUTPATIENT_CLINIC_OR_DEPARTMENT_OTHER): Payer: Self-pay

## 2023-01-23 MED ORDER — ISOSORBIDE MONONITRATE ER 120 MG PO TB24: 120.0000 mg | ORAL_TABLET | Freq: Two times a day (BID) | ORAL | 3 refills | Status: DC

## 2023-01-23 NOTE — Telephone Encounter (Signed)
Received fax from [facility] requesting refills for Isosorbide 120 mg.

## 2023-02-10 ENCOUNTER — Encounter: Payer: Self-pay | Admitting: Cardiology

## 2023-02-11 ENCOUNTER — Telehealth: Payer: Self-pay | Admitting: Pharmacist

## 2023-02-11 NOTE — Telephone Encounter (Signed)
[facility] PA submitted per pt request, Key: [id number].

## 2023-02-13 MED ORDER — ZEPBOUND 2.5 MG/0.5ML ~~LOC~~ SOAJ: 2.5000 mg | SUBCUTANEOUS | 0 refills | Status: DC

## 2023-02-13 NOTE — Telephone Encounter (Signed)
Pt wishes to use $550 cash card per month for Zepbound. Rx sent to pharmacy, sent msg to pt for PharmD appt for med counseling.

## 2023-02-13 NOTE — Addendum Note (Signed)
Addended by: Lizmary Nader E on: 02/13/2023 03:15 PM   Modules accepted: Orders

## 2023-02-13 NOTE — Telephone Encounter (Signed)
Prior auth denied - plan does not cover weight loss medications. Pt made aware.

## 2023-02-18 LAB — LIPID PANEL
Cholesterol: 264 mg/dL — ABNORMAL HIGH (ref <200)
HDL: 87 mg/dL (ref >=50)
LDL Cholesterol (Calc): 154 mg/dL (calc) — ABNORMAL HIGH
Non-HDL Cholesterol (Calc): 177 mg/dL (calc) — ABNORMAL HIGH (ref <130)
Total CHOL/HDL Ratio: 3 (calc) (ref <5.0)
Triglycerides: 115 mg/dL (ref <150)

## 2023-02-20 ENCOUNTER — Telehealth: Payer: Self-pay | Admitting: Pharmacist

## 2023-02-20 DIAGNOSIS — E782 Mixed hyperlipidemia: Secondary | ICD-10-CM

## 2023-02-20 MED ORDER — NEXLIZET 180-10 MG PO TABS: 1.0000 | ORAL_TABLET | Freq: Every day | ORAL | 11 refills | Status: DC

## 2023-02-20 NOTE — Telephone Encounter (Addendum)
Baseline LDL-C appears to be around 170-185. LDL-C down to only 154. Spoke with patient who relays good injection technique and no missed doses. Has given 5 injections. Possible PCSK9 mutation? Discussed options including adding on zetia, Nexlizet or trying rosuvastatin '5mg'$  twice a week.  Will try Nexlizet. I will submit PA to insurance and contact patient once I hear back. Continue Repatha q 14 days. Key Key: [id number]

## 2023-02-20 NOTE — Telephone Encounter (Addendum)
PA for nexlizet approved. Rx sent to [facility]. Called pt and left detailed message per DPR. Advised she can get a copay card at [email or web address] Call back to schedule labs

## 2023-02-20 NOTE — Addendum Note (Signed)
Addended by: Marcelle Overlie D on: 02/20/2023 03:19 PM   Modules accepted: Orders

## 2023-02-20 NOTE — Telephone Encounter (Signed)
Called pt to discuss Zepbound over the phone. Her insurance doesn't cover, she plans to use $550/month copay card. Pt states she hasn't picked med up yet, wanted to discuss first. Already making healthy diet choices, thinks some of her weight gain is secondary to medication and steroid-induced. Not as active as she used to be. Wants to try Zepbound short term for a few months due to cost. Discussed she can definitely try that, about 2/3 of weight regain was seen after GLP discontinuation in clinical trials. Discussed med info including MOA, injection technique, AE and how to mitigate, she doesn't eat any fried/fast food. She will give [place] a call and let us know if she does pick up the Zepbound so we can follow for monthly titrations - wanted to discuss with her husband again due to cost.

## 2023-02-25 NOTE — Telephone Encounter (Signed)
Patient called asking about if she really needs cholesterol medication if her HDL is high and she has a zero CAC score. Explained that HDL is no longer a positive risk factor. Also she is 75, so it is a good thing she doesn't have any calcium build up, but she is still Kara Anderson and has many risk factors including elevated LPa, high baseline LDL-C ~180, an inflammatory rheumatology disease. We want to treat now and decrease her 30 year risk score (which is 9%). Better to treat early- time to exposure disease Patient states she understands and thanked me for my time

## 2023-03-20 ENCOUNTER — Telehealth: Payer: Self-pay | Admitting: Cardiology

## 2023-03-20 NOTE — Telephone Encounter (Signed)
Pt c/o medication issue:  1. Name of Medication:   tirzepatide (ZEPBOUND) 2.5 MG/0.5ML Pen    2. How are you currently taking this medication (dosage and times per day)?   Inject 2.5 mg into the skin once a week.    3. Are you having a reaction (difficulty breathing--STAT)? No  4. What is your medication issue? Pt calling to make provider and pharmacist aware that she started medication yesterday. She would like a callback regarding dosage. Please advise

## 2023-03-24 NOTE — Telephone Encounter (Signed)
Patient returned call. Advised to call us after 3 injections to let us know if she would like to increase Zepbound.  Also asking about achiness in back that she has had with Repatha and thinks is a little worse after adding Nexlizet. Advised that Nexlizet has a low rate of muscle pains. Wants to know if it will go away. Advised I would give a little more time to see if it goes away. Let us know if it gets worse.  Requested to speak to [facility]. I advised I would send her a message to call her back.

## 2023-03-25 MED ORDER — RANOLAZINE ER 500 MG PO TB12: 1000.0000 mg | ORAL_TABLET | Freq: Two times a day (BID) | ORAL | 3 refills | Status: DC

## 2023-03-25 NOTE — Telephone Encounter (Signed)
Returned a callback to the pt.  Pt stated she had a Chest CT done back on 01/21/23 that was ordered by her Physician at [facility].   Pt is requesting if Dr. Shari Prows could take a look at those images (visible in epic) and give her thought about the impression that stated  mildly dilated pulmonary arterial trunk.  Pt states her Provider at [facility] told her it was most likely scar tissue, but she wanted to get Dr. Devin Going thoughts on this.   Pt also requested a refill of her ranexa to be sent to her pharmacy on file. Refill request sent.  Pt is aware that I will route this message to Dr. Shari Prows to further review and advise on 2/7 Chest CT results, and I will follow-up with her accordingly thereafter.  Pt states this is no rush.    Pt verbalized understanding and agrees with this plan.

## 2023-03-25 NOTE — Telephone Encounter (Signed)
Left the pt a message to call the office back. 

## 2023-03-26 ENCOUNTER — Encounter: Payer: Self-pay | Admitting: Cardiology

## 2023-03-27 ENCOUNTER — Encounter: Payer: Self-pay | Admitting: Pharmacist

## 2023-03-27 DIAGNOSIS — Z6828 Body mass index (BMI) 28.0-28.9, adult: Secondary | ICD-10-CM

## 2023-03-27 NOTE — Telephone Encounter (Signed)
Dr. Shari Prows sent this pt a mychart message regarding.  Please refer to mychart message for further details.

## 2023-03-31 ENCOUNTER — Other Ambulatory Visit: Payer: Self-pay | Admitting: *Deleted

## 2023-03-31 MED ORDER — AMLODIPINE BESYLATE 2.5 MG PO TABS: 2.5000 mg | ORAL_TABLET | Freq: Every day | ORAL | 3 refills | Status: DC

## 2023-04-06 MED ORDER — ZEPBOUND 2.5 MG/0.5ML ~~LOC~~ SOAJ: 2.5000 mg | SUBCUTANEOUS | 0 refills | Status: DC

## 2023-04-06 MED ORDER — ZEPBOUND 2.5 MG/0.5ML ~~LOC~~ SOAJ
2.5000 mg | SUBCUTANEOUS | 0 refills | Status: DC
Start: 2023-04-06 — End: 2023-06-01

## 2023-04-06 NOTE — Addendum Note (Signed)
Addended by: Malena Peer D on: 04/06/2023 04:31 PM   Modules accepted: Orders

## 2023-04-22 ENCOUNTER — Ambulatory Visit: Payer: BC Managed Care – PPO | Attending: Cardiology

## 2023-04-22 DIAGNOSIS — E782 Mixed hyperlipidemia: Secondary | ICD-10-CM

## 2023-04-22 LAB — COMPREHENSIVE METABOLIC PANEL
Bilirubin Total: 0.5 mg/dL (ref 0.0–1.2)
Calcium: 9.2 mg/dL (ref 8.7–10.2)
Sodium: 135 mmol/L (ref 134–144)

## 2023-04-22 LAB — URIC ACID: Uric Acid: 2.9 mg/dL (ref 2.6–6.2)

## 2023-04-22 LAB — APOLIPOPROTEIN B

## 2023-04-24 LAB — NMR, LIPOPROFILE
Cholesterol, Total: 144 mg/dL (ref 100–199)
HDL Particle Number: 40.1 umol/L (ref >=30.5)
HDL-C: 70 mg/dL (ref >39)
LDL Particle Number: 774 nmol/L (ref <1000)
LDL Size: 21.1 nm (ref >20.5)
LDL-C (NIH Calc): 63 mg/dL (ref 0–99)
LP-IR Score: 43 (ref <=45)
Small LDL Particle Number: 422 nmol/L (ref <=527)
Triglycerides: 53 mg/dL (ref 0–149)

## 2023-04-25 LAB — COMPREHENSIVE METABOLIC PANEL
ALT: 13 IU/L (ref 0–32)
AST: 16 IU/L (ref 0–40)
Albumin/Globulin Ratio: 2.2 (ref 1.2–2.2)
Albumin: 4.6 g/dL (ref 3.9–4.9)
Alkaline Phosphatase: 26 IU/L — ABNORMAL LOW (ref 44–121)
BUN/Creatinine Ratio: 10 (ref 9–23)
BUN: 9 mg/dL (ref 6–24)
CO2: 25 mmol/L (ref 20–29)
Chloride: 99 mmol/L (ref 96–106)
Creatinine, Ser: 0.87 mg/dL (ref 0.57–1.00)
Globulin, Total: 2.1 g/dL (ref 1.5–4.5)
Glucose: 87 mg/dL (ref 70–99)
Potassium: 4.5 mmol/L (ref 3.5–5.2)
Total Protein: 6.7 g/dL (ref 6.0–8.5)
eGFR: 83 mL/min/{1.73_m2} (ref >59)

## 2023-05-04 NOTE — Progress Notes (Unsigned)
Cardiology Office Note:    Date:  05/06/2023   ID:  Kara Anderson, DOB [birth year], MRN [id number]  PCP:  Marisue Ivan, MD   [facility] Providers Cardiologist:  Meriam Sprague, MD {  Referring MD: Marisue Ivan, MD     History of Present Illness:    Kara Anderson is a 47 y.o. 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  Previously followed by [facility] with last visit 02/19/21. Has history of Takayasu's arteritis, PA stenosis (08/2018 L. PA PTA) and mediastinal mass (11/2017 excised c/b elevated L. Hemidiaphragm). She has been seen for episodes of chest pain with work-up including TTE, ECG and enzymes were normal. She had a chest CT August 2020 which showed a patent pulmonary artery stent and no significant CAD. Was seen at [facility] and was diagnosed with microvascular dysfunction due to persistent substernal chest pain. Has been on imdur and amlodipine for microvascular dysfunction. She wants a Personal assistant to help manage her medications.  Seen in clinic on 04/24/21 where she was complaining of significant chest pressure and SOB in the setting of known microvascular dysfunction. Was also having episodes of hypotension with the amlodipine. We made multiple adjustments to her meds to try and improve symptoms.   Seen 07/08/21. She had discontinued Amlodipine due to hypotension and had adjusted dose of Imdur and nitroglycerin patch with improvement in symptoms.    Seen 09/19/21 with persistent angina despite PRN PO nitroglycerin, Imdur 120mg  BID, and 0.4mg  nitroglycerin patch. Amlodipine 2.5mg  QD was initiated. Via MyChart message 09/26/21 Amlodipine was increased to 5mg  QD and nitro patch reduced to 0.2mg .    Seen 10/04/21 with some improvement in symptoms. Still felt there was room for improvement in anginal symptoms and  hopeful to further wean off nitroglycerin patch. At that visit and via MyChart message Amlodipine 2.5mg  QD and Toprol 12.5mg  QHS were able to be tolerated and nitroglycerin patch discontinued.    Saw Gillian Shields on 11/15/21 where she continued to have significant symptoms with concern for nitro tolerance. Since that time, we trialed her off amlodipine and on diltiazem.   Seen on 02/2022 where she was recommended continue Imdur 120mg  BID, Ranexa 1000mg  BID, and retrial nitro patch 0.3mg  QD. Metoprolol discontinued due to fatigue.    She was admitted 04/2022 with pneumonia. She has since established with Dr. Karna Christmas of [facility] pulmonology. Last seen 06/18/22 by Dr. Karna Christmas due to hypersensitivity pneumonitis with Farmer's lung (abnormal CT with ground glass infiltrates) recommended for Itraconazole 100mg  QD x 6 months and Prednisone 5mg  QD.   Seen  06/2022 by Gillian Shields where she continued to have chest discomfort with exertion as well as joint pain. Cardiac PET showed normal EF and normal MBF with appropriate augmentation with stress (myocardial blood flow reserve was 3.32).  Was last seen in clinic on 10/2022 where she was stable from a CV perspective. Continued to have intermittent chest pain but this was fairly well managed her her nitro regimen. Was started for nexlezet and repatha for LDL 170 with significant improvement to 63.  Today, the patient is doing okay. Continues to have chest pain with exertion and with significant stress. She has been adjusting her nitro regimen to help manage her symptoms as much as she can and states she has been managing well. Rare episodes of hypotension which she will manage by backing off  on her nitro for that day. No orthopnea, PND, or LE edema.  Past Medical History:  Diagnosis Date   Allergy    Anemia    d/t medications per patient   Anxiety    Bronchitis 08/20/2022   micropurulent and recurrent, pt started on Augmentin 08/20/22, see OV w/ Dr. Clydie Braun on 08/20/22.   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   GERD (gastroesophageal reflux disease)    Heart murmur    pt reports she no longer has this d/t control of her vasculitis   HLD (hyperlipidemia) 09/29/2019   Hypersensitivity pneumonitis (HCC)    As of 08/20/22, pt is being treated w/ Itraconazole. Pt follws with pulmonologist, Dr. Vida Rigger @ [facility], LOV 06/18/22. Pt had abnormal chest imaging w/ ground glass infiltrates.   Large vessel vasculitis (HCC)    Follwed by Dr. Loma Sender @ [facility], LOV 07/02/22. Pt is being treated with infusions of infliximab.   Malaise and fatigue 09/29/2019   Mediastinal adenopathy    Microvascular angina    microvascular dysfunction and chronic angina, follows w/ Dr. Shari Prows at [facility], LOV 06/27/22 as of 08/20/22, Patient is currently on Ranexa, Imdur, NTG patch and sublingual NTG prn., 05/29/22 Echo in Epic showed EF 65 - 70%, 08/19/22 NM PET CT Cardiac Perfusion in Epic showed no evidence of ischemia   Migraines 02/2022   Daily HA w/ constant pressure, bi-temporal pain, photophobia, phonophobia and nausea. 07/27/22 MRI brain showed a normal appearance. Pt started on Topamax and Nurtec on 07/29/22. See 07/29/22 OV w/ neurologist Dr. Larose Kells @ [facility].   Mononucleosis    Pneumonia    PONV (postoperative nausea and vomiting)    Pulmonary nodules 12/13/2021   tiny 2 -3 mm nodules per 12/13/21 Chest CT in Epic done for persistent cough and night sweats   Seasonal allergies    Seronegative rheumatoid arthritis (HCC)    Followed by Dr. Orion Crook at [facility], LOV 07/02/22. Pt receives infusions of Avsola, last infusion 07/12/22 as of  08/21/22.   Status post pulmonary artery branches stent placement    left main pulmonary artery stent   Strep throat    Takayasu's arteritis (HCC)    Followed by Dr. Orion Crook at [facility], LOV 07/02/22 in Epic.   Thyroid disease    hyothyroidism   Viral meningitis    Vitamin D deficiency disease 09/29/2019    Past Surgical History:  Procedure Laterality Date   HIP SURGERY Right 12/16/2011   IR THORACENTESIS ASP PLEURAL SPACE W/IMG GUIDE  11/20/2017   LAPAROSCOPIC ABDOMINAL EXPLORATION  2007,2009   endometriosis   MEDIASTINOTOMY CHAMBERLAIN MCNEIL    Left 11/16/2017   Procedure: LEFT ANTERIOR MEDIASTINOTOMY CHAMBERLAIN PROCEDURE;  Surgeon: Loreli Slot, MD;  Location: [facility] OR;  Service: Thoracic;  Laterality: Left;   NOSE SURGERY  12/15/2005   sinus surgery - reconstructe turbinates and deviated septum   Pulmonary Artery Stent Left 10/2018   ROBOTIC ASSISTED LAPAROSCOPIC HYSTERECTOMY AND SALPINGECTOMY Bilateral 10/08/2022   Procedure: XI ROBOTIC ASSISTED LAPAROSCOPIC TOTAL HYSTERECTOMY AND BILATERAL SALPINGECTOMY;  Surgeon: Genia Del, MD;  Location: [facility] OR;  Service: Gynecology;  Laterality:  Bilateral;    Current Medications: Current Meds  Medication Sig   acidophilus (RISAQUAD) CAPS capsule Take 1 capsule by mouth daily.   amitriptyline (ELAVIL) 10 MG tablet Take 30 mg by mouth at bedtime.   amLODipine (NORVASC) 2.5 MG tablet Take 1 tablet (2.5 mg total) by mouth daily.   aspirin 81 MG EC tablet Take 81 mg by mouth daily.   Bempedoic Acid-Ezetimibe (NEXLIZET) 180-10 MG TABS Take 1 tablet by mouth daily at 6 (six) AM.   celecoxib (CELEBREX) 200 MG capsule Take 200 mg by mouth 2 (two) times daily.   Cholecalciferol (VITAMIN D-3) 125 MCG (5000 UT) TABS Take 5,000 Units by mouth every other day.   Coenzyme Q10 (CO Q 10 PO) Take 250 mg by mouth at bedtime.   Evolocumab (REPATHA SURECLICK) 140 MG/ML SOAJ Inject 140 mg into the skin every 14 (fourteen) days.    gabapentin (NEURONTIN) 300 MG capsule Take 600-900 mg by mouth See admin instructions. Take 600 mg by mouth in the morning, 600 mg at noon and 900 mg at bedtime   inFLIXimab-axxq (AVSOLA) 100 MG injection Inject 100 mg into the vein every 2 (two) months.   isosorbide mononitrate (IMDUR) 120 MG 24 hr tablet Take 1 tablet (120 mg total) by mouth 2 (two) times daily.   MAGNESIUM PO Take 400 mg by mouth at bedtime.   Multiple Vitamins-Minerals (MULTIVITAMIN ADULT) CHEW Chew 2 tablets by mouth daily.   nitroGLYCERIN (NITRODUR - DOSED IN MG/24 HR) 0.3 mg/hr patch Place 1 patch (0.3 mg total) onto the skin daily.   nitroGLYCERIN (NITROSTAT) 0.4 MG SL tablet PLACE 1 TABLET UNDER TONGUE EVERY 5 MINUTES AS NEEDED FOR CHEST PAIN   NURTEC 75 MG TBDP Take 75 mg by mouth daily as needed (migraine).   ondansetron (ZOFRAN-ODT) 8 MG disintegrating tablet Take 8 mg by mouth every 8 (eight) hours as needed for nausea or vomiting.   ranolazine (RANEXA) 500 MG 12 hr tablet Take 2 tablets (1,000 mg total) by mouth 2 (two) times daily.   tirzepatide (ZEPBOUND) 2.5 MG/0.5ML Pen Inject 2.5 mg into the skin once a week.   VITAMIN E PO Take 1 capsule by mouth every other day.     Allergies:   Codeine and Crestor [rosuvastatin]   Social History   Socioeconomic History   Marital status: Married    Spouse name: Not on file   Number of children: Not on file   Years of education: Not on file   Highest education level: Not on file  Occupational History   Not on file  Tobacco Use   Smoking status: Never    Passive exposure: Never   Smokeless tobacco: Never  Vaping Use   Vaping Use: Never used  Substance and Sexual Activity   Alcohol use: No    Alcohol/week: 0.0 standard drinks of alcohol   Drug use: No   Sexual activity: Not Currently    Partners: Male    Birth control/protection: Surgical    Comment: hysterectomy  Other Topics Concern   Not on file  Social History Narrative   Married for 12 years.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   Social Determinants of Health   Financial Resource Strain: Not on file  Food Insecurity: Not on file  Transportation Needs: Not on file  Physical Activity: Not on file  Stress: Not on file  Social Connections: Not on file     Family History: The patient's family history includes Alcohol abuse  in her father; Alcoholism in her father; Brain cancer (age of onset: 79) in her mother; Breast cancer in her maternal aunt; Breast cancer (age of onset: 29) in her cousin; Colon cancer in her maternal grandmother; Colonic polyp (age of onset: 13) in her sister; Diabetes in her mother; Heart attack in her paternal grandfather; Heart disease in her father and mother; Leukemia (age of onset: 64) in her cousin; Lung cancer (age of onset: 13) in her paternal uncle; Other in her sister; Other (age of onset: 1) in her paternal uncle; Pancreatic disease in her maternal grandmother; Stomach cancer in her maternal grandmother; Stroke in her mother. There is no history of Esophageal cancer, Liver disease, or Rectal cancer.  ROS:   Please see the history of present illness.      EKGs/Labs/Other Studies Reviewed:    The following studies were reviewed today:  Cardiac PET 08/2022:    LV perfusion is normal. There is no evidence of ischemia. There is no evidence of infarction.   Rest left ventricular function is normal. Rest EF: 58 %. Stress left ventricular function is normal. Stress EF: 70 %. End diastolic cavity size is normal. End systolic cavity size is normal.   Myocardial blood flow was computed to be 0.25ml/g/min at rest and 3.60ml/g/min at stress. Global myocardial blood flow reserve was 3.32 and was normal.   Coronary calcium was absent on the attenuation correction CT images.   The study is normal. The study is low risk.  TTE 05/29/2022: 1. Global longitudinal strain is -23.6%. Left ventricular ejection  fraction, by estimation, is 65 to  70%. The left ventricle has normal  function. The left ventricle has no regional wall motion abnormalities.  Left ventricular diastolic parameters were  normal.   2. Right ventricular systolic function is normal. The right ventricular  size is normal.   3. The mitral valve is normal in structure. Trivial mitral valve  regurgitation.   4. The aortic valve is normal in structure. Aortic valve regurgitation is  not visualized.   5. The inferior vena cava is normal in size with greater than 50%  respiratory variability, suggesting right atrial pressure of 3 mmHg.   Comparison(s): The left ventricular function is unchanged. 01/19/20 EF  55-60%.    TTE 01/19/20:  1. Left ventricular ejection fraction, by visual estimation, is 55 to  60%. The left ventricle has normal function. There is no left ventricular  hypertrophy.   2. The left ventricle has no regional wall motion abnormalities.   3. Global right ventricle has normal systolic function.The right  ventricular size is normal. No increase in right ventricular wall  thickness.   4. Left atrial size was normal.   5. Right atrial size was normal.   6. The mitral valve is normal in structure. Mild mitral valve  regurgitation. No evidence of mitral stenosis.   7. The tricuspid valve is normal in structure.   8. The tricuspid valve is normal in structure. Tricuspid valve  regurgitation is mild.   9. The aortic valve is normal in structure. Aortic valve regurgitation is  not visualized. No evidence of aortic valve sclerosis or stenosis.  10. The pulmonic valve was normal in structure. Pulmonic valve  regurgitation is not visualized.  11. Normal pulmonary artery systolic pressure.  12. The tricuspid regurgitant velocity is 1.66 m/s, and with an assumed  right atrial pressure of 3 mmHg, the estimated right ventricular systolic  pressure is normal at 14.0 mmHg.  13. The  inferior vena cava is normal in size with greater than 50%  respiratory  variability, suggesting right atrial pressure of 3 mmHg.  14. The average left ventricular global longitudinal strain is 21.2 %.  Carotid ultrasound 10/2017: Final Interpretation:  Right Carotid: There was no evidence of thrombus, dissection,  atherosclerotic plaque or stenosis in the cervical carotid system.  Imaging over the aortic arch shows what appears to be color turbulence near the pulmonary artery. Short axis and subcostal views of the heart revealed peak CW gradient of the pulmonic artery in the range of 40-54 mmHg. Pulmonic insufficiency also noted.   Left Carotid: There was no evidence of thrombus, dissection,  atherosclerotic plaque or stenosis in the cervical carotid system.   Vertebrals:  Both vertebral arteries were patent with antegrade flow.  Subclavians: Normal flow hemodynamics were seen in bilateral subclavian arteries. There is also a mobile linear structure in the right  subclavian vein Discussed with lead tech who thought structure was a valve but appears longer than usual   EKG: EKG is personally reviewed. No new tracing today    Recent Labs: 10/08/2022: Hemoglobin 13.3; Platelets 281 04/22/2023: ALT 13; BUN 9; Creatinine, Ser 0.87; Potassium 4.5; Sodium 135  Recent Lipid Panel    Component Value Date/Time   CHOL 264 (H) 02/17/2023 0833   TRIG 115 02/17/2023 0833   HDL 87 02/17/2023 0833   CHOLHDL 3.0 02/17/2023 0833   LDLCALC 154 (H) 02/17/2023 0833      Physical Exam:    VS:  BP 112/74   Pulse 85   Ht 5\' 4"  (1.626 m)   Wt 162 lb 6.4 oz (73.7 kg)   LMP 09/21/2022 (Exact Date)   SpO2 95%   BMI 27.88 kg/m     Wt Readings from Last 3 Encounters:  05/06/23 162 lb 6.4 oz (73.7 kg)  10/31/22 172 lb (78 kg)  10/08/22 167 lb (75.8 kg)     GEN:  Well nourished, well developed in no acute distress HEENT: Normal NECK: No JVD; No carotid bruits CARDIAC: RRR, no murmurs RESPIRATORY:  CTAB, no wheezes ABDOMEN: Soft, non-tender,  non-distended MUSCULOSKELETAL:  No edema; No deformity  SKIN: Warm and dry NEUROLOGIC:  Alert and oriented x 3 PSYCHIATRIC:  Normal affect   ASSESSMENT:    1. Microvascular angina   2. Mixed hyperlipidemia   3. Pulmonary artery stenosis   4. Takayasu's arteritis (HCC)   5. Rheumatoid arthritis, involving unspecified site, unspecified whether rheumatoid factor present (HCC)     PLAN:    In order of problems listed above:  #Microvascular Dysfunction with Angina: Patient was diagnosed with microvascular dysfunction at [facility] clinic. We have tried multiple different medication regimens to try to manage symptoms without causing too much hypotension. Overall, she has had significant improvement but continues to have breakthrough episodes. NM PET on therapy reassuring with normal EF and normal/robust global myocardial blood flow reserve (3.22). Will continue medication adjustments as able.  -Continue imdur 120mg  BID -Continue ranexa 100mg  BID -COntinue nitro patch 0.3mg  daily; can take intermittent breaks to prevent tolerance -Continue amlodipine 2.5mg  daily -Stopped metop due fatigue -Continue nitro prn for breakthrough -Encouraged her to stay very hydrated and drink electrolyte replacement drinks (such as gatorade/poweraide zero) as well as to liberalize her salt intake to help keep her blood pressure up  #Pneumonitis and Farmer's Lung: Follows with Dr. Karna Christmas of pulmonology. Improvement in dyspnea since Trelegy. Currently on Itraconazole and Prednisone.    #Pulmonary artery stenosis s/p stenting 09/09/2018: -  06/2018 Echo: EF >55%, LAE 3.6 cm, mild PR, dilated PA and IVC, increased velocities through pulmonary artery. Peak MPA velocity 2.46M/S, peak gradient 24 mmHg  -06/2018 Chest MRI: Stenosis of the right main and left main pulmonary arteries measuring 9 and 8 mm, respectively, most likely related to reported prior episode of Takayasu's arteritis. Detection of active vasculitic  inflammation is limited by MR angiogram due to significant motion artifact. Scattered left lower lobe nodules may represent atelectasis versus aspiration. August 20, 2018 RHC: RA mean 7 mmHg, RV 33/6 mmHg, PA 32/18 mean 17, PWP - 8 mmHg, Index 4.6 PTA of her left pulmonary artery 08/2018 Echo: EF >55%, dilated PA -Followed by [facility]; has been stable  -Continue ASA 81mg  daily -Continue imdur 120mg  BID -Cath at [facility] in 2020 with no significant CAD  #Takayatsu Arteritis: #RA Followed by Rheum. On MTX. -Follow-up with rheum as scheduled -Continue MTX  #HLD: -Continue nexlezet and repatha -LDL significantly improved 173>63   Follow up: 6 months with Dr. Cristal Deer  Medication Adjustments/Labs and Tests Ordered: Current medicines are reviewed at length with the patient today.  Concerns regarding medicines are outlined above.  No orders of the defined types were placed in this encounter.  No orders of the defined types were placed in this encounter.   Patient Instructions  Medication Instructions:   Your physician recommends that you continue on your current medications as directed. Please refer to the Current Medication list given to you today.  *If you need a refill on your cardiac medications before your next appointment, please call your pharmacy*    Follow-Up: At [facility], you and your health needs are our priority.  As part of our continuing mission to provide you with exceptional heart care, we have created designated Provider Care Teams.  These Care Teams include your primary Cardiologist (physician) and Advanced Practice Providers (APPs -  Physician Assistants and Nurse Practitioners) who all work together to provide you with the care you need, when you need it.  We recommend signing up for the patient portal called "MyChart".  Sign up information is provided on this After Visit Summary.  MyChart is used to connect with patients for Virtual Visits  (Telemedicine).  Patients are able to view lab/test results, encounter notes, upcoming appointments, etc.  Non-urgent messages can be sent to your provider as well.   To learn more about what you can do with MyChart, go to [email or web address].    Your next appointment:   6 month(s)  Provider:   Jodelle Red, MD       Signed, Meriam Sprague, MD  05/06/2023 12:07 PM    [facility]

## 2023-05-06 ENCOUNTER — Ambulatory Visit: Payer: BC Managed Care – PPO | Attending: Cardiology | Admitting: Cardiology

## 2023-05-06 ENCOUNTER — Encounter: Payer: Self-pay | Admitting: *Deleted

## 2023-05-06 ENCOUNTER — Encounter: Payer: Self-pay | Admitting: Cardiology

## 2023-05-06 VITALS — BP 112/74 | HR 85 | Ht 64.0 in | Wt 162.4 lb

## 2023-05-06 DIAGNOSIS — I2089 Other forms of angina pectoris: Secondary | ICD-10-CM | POA: Diagnosis not present

## 2023-05-06 DIAGNOSIS — E782 Mixed hyperlipidemia: Secondary | ICD-10-CM | POA: Diagnosis not present

## 2023-05-06 DIAGNOSIS — Q256 Stenosis of pulmonary artery: Secondary | ICD-10-CM

## 2023-05-06 DIAGNOSIS — M069 Rheumatoid arthritis, unspecified: Secondary | ICD-10-CM

## 2023-05-06 DIAGNOSIS — M314 Aortic arch syndrome [Takayasu]: Secondary | ICD-10-CM | POA: Diagnosis not present

## 2023-05-06 NOTE — Patient Instructions (Signed)
Medication Instructions:  Your physician recommends that you continue on your current medications as directed. Please refer to the Current Medication list given to you today.  *If you need a refill on your cardiac medications before your next appointment, please call your pharmacy*  Follow-Up: At [facility], you and your health needs are our priority.  As part of our continuing mission to provide you with exceptional heart care, we have created designated Provider Care Teams.  These Care Teams include your primary Cardiologist (physician) and Advanced Practice Providers (APPs -  Physician Assistants and Nurse Practitioners) who all work together to provide you with the care you need, when you need it.  We recommend signing up for the patient portal called "MyChart".  Sign up information is provided on this After Visit Summary.  MyChart is used to connect with patients for Virtual Visits (Telemedicine).  Patients are able to view lab/test results, encounter notes, upcoming appointments, etc.  Non-urgent messages can be sent to your provider as well.   To learn more about what you can do with MyChart, go to [email or web address].    Your next appointment:   6 month(s)  Provider:   Bridgette Christopher, MD    

## 2023-06-01 ENCOUNTER — Telehealth: Payer: Self-pay | Admitting: Pharmacist

## 2023-06-01 DIAGNOSIS — Z6828 Body mass index (BMI) 28.0-28.9, adult: Secondary | ICD-10-CM

## 2023-06-01 MED ORDER — ZEPBOUND 5 MG/0.5ML ~~LOC~~ SOAJ
5.0000 mg | SUBCUTANEOUS | 0 refills | Status: DC
Start: 2023-06-01 — End: 2023-07-23

## 2023-06-01 NOTE — Telephone Encounter (Signed)
Pt left VM requesting to increase to Zepbound 5mg . Rx send to [facility] in [place] per request. Called pt and let her know that we sent in new Rx/

## 2023-06-12 ENCOUNTER — Encounter: Payer: Self-pay | Admitting: Cardiology

## 2023-06-12 MED ORDER — ISOSORBIDE MONONITRATE ER 30 MG PO TB24: 90.0000 mg | ORAL_TABLET | Freq: Two times a day (BID) | ORAL | 11 refills | Status: DC

## 2023-06-22 ENCOUNTER — Telehealth: Payer: Self-pay | Admitting: Pharmacist

## 2023-06-22 NOTE — Telephone Encounter (Signed)
Patient called. States that she is having a hard time tolerating her imdur. States she is feeling dizzy. BP is low. Reported a few readings in the 80's systolic. I recommended she stop her amlodipine 2.5mg . This probably is having the biggest effect on BP. She should take imdur 30mg . If this is not controlling her chest pain and her BP is >100 systolic, ok to increase dose. Previously on much higher does.  She has lost weight with Zepbound and feels like her immunosuppressant is working and feels better. Weights loss and increase activity could have decreased BP.

## 2023-07-20 ENCOUNTER — Other Ambulatory Visit: Payer: Self-pay | Admitting: Family Medicine

## 2023-07-20 DIAGNOSIS — Z1231 Encounter for screening mammogram for malignant neoplasm of breast: Secondary | ICD-10-CM

## 2023-07-22 ENCOUNTER — Telehealth: Payer: Self-pay | Admitting: Cardiology

## 2023-07-22 NOTE — Progress Notes (Unsigned)
Cardiology Office Note:  .   Date:  07/23/2023  ID:  Kara Anderson, DOB 1976/07/31, MRN [id number] PCP: Marisue Ivan, MD  [facility] Providers Cardiologist:  Meriam Sprague, MD {  History of Present Illness: .   Kara Anderson is a 47 y.o. 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  Previously followed by [facility] cardiology in 2022.  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  She has had these episodes of chest pain with workup including TTE, EKG, and enzymes which were all normal.  Had chest CT August 2020 which showed a patent pulmonary artery stent and no significant CAD.  Was seen at the [facility] and was diagnosed with microvascular dysfunction due to persistent substernal chest pain.  Has been on Imdur and amlodipine for microvascular dysfunction.  Wants a local cardiologist to assist.  She was seen in May 2022 where she was complaining of significant chest pressure and SOB in setting of known microvascular dysfunction.  Also was having hypotension on amlodipine.  Made a few medication adjustments.  Seen 07/08/2021 and discontinued amlodipine due to hypotension and adjusted dose of Imdur and nitroglycerin patch with improvement in symptoms.  10/22 was seen with persistent angina despite as needed p.o. nitroglycerin, Imdur 120 twice daily and 0.4 mg nitro patch.  Amlodipine 2.5 daily was initiated.  Via MyChart message amlodipine was increased to 5 mg daily and Nitropatch is reduced to 0.2 mg.  Seen October 22 with some improvement in symptoms.  Seen March 2023 and was recommended to continue her current regimen of Imdur 120 mg twice daily, Ranexa 1000 mg twice daily, and Nitropatch 0.3 mg daily.  Metoprolol was discontinued due to fatigue  Admitted with pneumonia  May 2023.  Been seen in follow-up by Gillian Shields, NP 7/23 and a cardiac PET showed normal EF and normal MBF with appropriate augmentation with stress (myocardial flow reserve was 3.32).  Seen 11/23 and at that time was doing well.  Chest pain was fairly well-managed.  LDL 170 with significant improvement at 63 on Repatha.  Last seen May 2024.  Was doing okay at that time.  Did not have chest pain with exertion and with significant stress.  She had been trying to adjust her nitro regimen to help manage her symptoms as much she can states that she has been managing well.  Rare episodes of hypotension which she will manage by backing off on her nitro for that day.  No orthopnea, PND, lower extremity edema.  Today, she tells me that she had been having some lightheadedness back in June and was told she could decrease her Imdur dose.  Ultimately got down to 30 mg twice a day (previously taking 160 mg twice a day) however, was having chest pains so was told to go back up to 60 mg twice a day.  Her Nitropatch was discontinued as well at this time.  Unfortunately, blood pressure low normal today and she brings in readings as low as 79 systolic a few days ago.  She is however still having chest pain which she describes as a squeezing sensation in her chest.  Happens at night and is not necessarily exertional.  She has been drinking a gallon to a gallon and a half of water a day, liquid IV, and small salty  snacks.  Send her situation is very complex, I discussed this patient with Dr. Mayford Knife today who is DOD.  Reports no shortness of breath nor dyspnea on exertion. Reports no chest pain, pressure, or tightness. No edema, orthopnea, PND. Reports no palpitations.   ROS: Pertinent ROS in HPI  Studies Reviewed: .        ardiac PET 08/2022:    LV perfusion is normal. There is no evidence of ischemia. There is no evidence of infarction.   Rest left ventricular function is normal. Rest EF: 58 %. Stress left  ventricular function is normal. Stress EF: 70 %. End diastolic cavity size is normal. End systolic cavity size is normal.   Myocardial blood flow was computed to be 0.81ml/g/min at rest and 3.49ml/g/min at stress. Global myocardial blood flow reserve was 3.32 and was normal.   Coronary calcium was absent on the attenuation correction CT images.   The study is normal. The study is low risk.   TTE 05/29/2022: 1. Global longitudinal strain is -23.6%. Left ventricular ejection  fraction, by estimation, is 65 to 70%. The left ventricle has normal  function. The left ventricle has no regional wall motion abnormalities.  Left ventricular diastolic parameters were  normal.   2. Right ventricular systolic function is normal. The right ventricular  size is normal.   3. The mitral valve is normal in structure. Trivial mitral valve  regurgitation.   4. The aortic valve is normal in structure. Aortic valve regurgitation is  not visualized.   5. The inferior vena cava is normal in size with greater than 50%  respiratory variability, suggesting right atrial pressure of 3 mmHg.   Comparison(s): The left ventricular function is unchanged. 01/19/20 EF  55-60%.     TTE 01/19/20:  1. Left ventricular ejection fraction, by visual estimation, is 55 to  60%. The left ventricle has normal function. There is no left ventricular  hypertrophy.   2. The left ventricle has no regional wall motion abnormalities.   3. Global right ventricle has normal systolic function.The right  ventricular size is normal. No increase in right ventricular wall  thickness.   4. Left atrial size was normal.   5. Right atrial size was normal.   6. The mitral valve is normal in structure. Mild mitral valve  regurgitation. No evidence of mitral stenosis.   7. The tricuspid valve is normal in structure.   8. The tricuspid valve is normal in structure. Tricuspid valve  regurgitation is mild.   9. The aortic valve is normal in  structure. Aortic valve regurgitation is  not visualized. No evidence of aortic valve sclerosis or stenosis.  10. The pulmonic valve was normal in structure. Pulmonic valve  regurgitation is not visualized.  11. Normal pulmonary artery systolic pressure.  12. The tricuspid regurgitant velocity is 1.66 m/s, and with an assumed  right atrial pressure of 3 mmHg, the estimated right ventricular systolic  pressure is normal at 14.0 mmHg.  13. The inferior vena cava is normal in size with greater than 50%  respiratory variability, suggesting right atrial pressure of 3 mmHg.  14. The average left ventricular global longitudinal strain is 21.2 %.   Carotid ultrasound 10/2017: Final Interpretation:  Right Carotid: There was no evidence of thrombus, dissection,  atherosclerotic plaque or stenosis in the cervical carotid system.  Imaging over the aortic arch shows what appears to be color turbulence near the pulmonary artery. Short axis and subcostal views of the heart revealed peak  CW gradient of the pulmonic artery in the range of 40-54 mmHg. Pulmonic insufficiency also noted.   Left Carotid: There was no evidence of thrombus, dissection,  atherosclerotic plaque or stenosis in the cervical carotid system.   Vertebrals:  Both vertebral arteries were patent with antegrade flow.  Subclavians: Normal flow hemodynamics were seen in bilateral subclavian arteries. There is also a mobile linear structure in the right  subclavian vein Discussed with lead tech who thought structure was a valve but appears longer than usual         Physical Exam:   VS:  BP 110/72 Comment: left arm  Pulse 81   Ht 5\' 4"  (1.626 m)   Wt 150 lb (68 kg)   LMP 09/21/2022 (Exact Date)   SpO2 98%   BMI 25.75 kg/m    Wt Readings from Last 3 Encounters:  07/23/23 150 lb (68 kg)  05/06/23 162 lb 6.4 oz (73.7 kg)  10/31/22 172 lb (78 kg)    GEN: Well nourished, well developed in no acute distress NECK: No JVD; No carotid  bruits CARDIAC: RRR, no murmurs, rubs, gallops RESPIRATORY:  Clear to auscultation without rales, wheezing or rhonchi  ABDOMEN: Soft, non-tender, non-distended EXTREMITIES:  No edema; No deformity   ASSESSMENT AND PLAN: .   1.  Microvascular disease -will trial protonix 40mg  BID for 1-2 weeks -continue current dose of imdur (60mg  BID) -continue to hold amlodipine and nitroglycerin patch  -plan B would be low dose midodrine and titration of her imdur  2.  HLD -continue current medications -last LDL was 63  3.  Pulmonary artery stenosis s/p stenting 08/2018 -followed by [facility] and has been stable -Continue ASA 81 mg daily, Imdur 60 mg twice a day -Cardiac catheterization at the [facility] in 2020 without significant CAD  4.  Takayasu arteritis/RA -She feels like she does not have as much information -Recently started on new medication Avsola      Dispo: She can follow-up with me in 2 weeks  Signed, Sharlene Dory, PA-C

## 2023-07-22 NOTE — Telephone Encounter (Signed)
Pt c/o medication issue:  1. Name of Medication:   isosorbide mononitrate (IMDUR) 30 MG 24 hr tablet    2. How are you currently taking this medication (dosage and times per day)?    3. Are you having a reaction (difficulty breathing--STAT)? no  4. What is your medication issue? States medication is causing her chest pain and sob. Seeing if it needed to be reduce more. Her bp has 79/57; 104/66. Please advise   Also that Dr, Cristal Deer is not listed in her my chart to be able to message.  ?.

## 2023-07-22 NOTE — Telephone Encounter (Signed)
Spoke with patient regarding Imdur, blood pressure, and chest pain   7/3 decreased Imdur to 60 mg, 7/6 30 mg blood pressure  89/61 7/7 blood pressure 79/62 & 86/60 2 weeks took Imdur 30 mg twice a day, had chest pains Increased Imdur to 60 mg twice a day which has helped the chest pain but her blood pressure continues to be low Blood pressures readings 8-4 95/60, 8-6 79/57, and 8-7 90/65  On 7-8 Amlodipine discontinued   Scheduled patient appointment to see Kara Hy NP tomorrow at [facility] office Patient aware of date, time, and location

## 2023-07-23 ENCOUNTER — Encounter: Payer: Self-pay | Admitting: Physician Assistant

## 2023-07-23 ENCOUNTER — Telehealth: Payer: Self-pay | Admitting: Cardiology

## 2023-07-23 ENCOUNTER — Ambulatory Visit: Payer: BC Managed Care – PPO | Attending: Physician Assistant | Admitting: Physician Assistant

## 2023-07-23 VITALS — BP 110/72 | HR 81 | Ht 64.0 in | Wt 150.0 lb

## 2023-07-23 DIAGNOSIS — Z79899 Other long term (current) drug therapy: Secondary | ICD-10-CM | POA: Diagnosis not present

## 2023-07-23 DIAGNOSIS — E782 Mixed hyperlipidemia: Secondary | ICD-10-CM

## 2023-07-23 DIAGNOSIS — M314 Aortic arch syndrome [Takayasu]: Secondary | ICD-10-CM | POA: Diagnosis not present

## 2023-07-23 DIAGNOSIS — M069 Rheumatoid arthritis, unspecified: Secondary | ICD-10-CM

## 2023-07-23 DIAGNOSIS — R6889 Other general symptoms and signs: Secondary | ICD-10-CM

## 2023-07-23 DIAGNOSIS — Q256 Stenosis of pulmonary artery: Secondary | ICD-10-CM | POA: Diagnosis not present

## 2023-07-23 DIAGNOSIS — I2089 Other forms of angina pectoris: Secondary | ICD-10-CM

## 2023-07-23 MED ORDER — PANTOPRAZOLE SODIUM 40 MG PO TBEC: 40.0000 mg | DELAYED_RELEASE_TABLET | Freq: Every day | ORAL | 3 refills | Status: DC

## 2023-07-23 NOTE — Patient Instructions (Signed)
Medication Instructions:  Your physician has recommended you make the following change in your medication:   Start taking Protonix 40 mg 2 times daily. Let Julian Hy know how you are doing in 1 week.  *If you need a refill on your cardiac medications before your next appointment, please call your pharmacy*   Follow-Up: At [facility], you and your health needs are our priority.  As part of our continuing mission to provide you with exceptional heart care, we have created designated Provider Care Teams.  These Care Teams include your primary Cardiologist (physician) and Advanced Practice Providers (APPs -  Physician Assistants and Nurse Practitioners) who all work together to provide you with the care you need, when you need it.   Your next appointment:   2 week(s)  Provider:   Jari Favre, PA-C

## 2023-07-23 NOTE — Telephone Encounter (Signed)
I last saw Miss Todorov 06/27/22 - cardiac PET ordered due to angina despite Imdur 120mg  BID, Amlodipine 2.62m every day, nitroglycerin patch 0.3mg , Ranexa 100mg  BID, PRN nitroglycerin tablet.   Did not tolerate Metoprolol nor diltiazem.   7/3 decreased Imdur to 60 mg, 7/6 30 mg blood pressure  89/61 7/7 blood pressure 79/62 & 86/60 2 weeks took Imdur 30 mg twice a day, had chest pains Increased Imdur to 60 mg twice a day which has helped the chest pain but her blood pressure continues to be low Blood pressures readings 8-4 95/60, 8-6 79/57, and 8-7 90/65   On 7-8 Amlodipine discontinued   Saw our team today recommended: Trial protonix 40mg  bid 1-2 weeks Continue imdur 60 bid Hold amlo, nitroglycerin patch Consider next step Midodrine if needed to tolerate anti-anginals  Called and spoke with Miss Grinage. Endorses feel frustrated by hypotension requiring reduced doses of anti-anginals with anti-anginal symptoms. Very appreciative of Jari Favre, PA's time today. Does inquire if she can change her follow up to [facility] as she is establishing with Dr. Cristal Deer and has met me previously, will adjust appointment. Rescheduled for 08/06/22 at 2:20 pm with myself at [facility].  Wonders if infection contributory to hypotension, labs 06/2023 normal wbc. Used nitroglycerin tablets yesterday but not on consistent basis for squeezing chest pain while driving. Drinking Liquid IV each morning (after protein coffee shake and V8 energy can). Recommend adding midday LiquidIV and salting food to prevent hypotension.   Of antianginals, Imdur seems to be the most effective over Amlodipine. Will prioritize trying to increase Imdur for symptoms as BP allows.   Alver Sorrow, NP

## 2023-07-23 NOTE — Telephone Encounter (Signed)
Patient is requesting to speak directly with Gillian Shields, NP, regarding a personal matter.  She explained several times that she prefers that I do not speak about this matter in her chart.  Patient prefers to speak with NP and she states she does not care if she has to be billed for it.

## 2023-07-28 ENCOUNTER — Telehealth: Payer: Self-pay | Admitting: Pharmacy Technician

## 2023-07-28 NOTE — Telephone Encounter (Signed)
Pharmacy Patient Advocate Encounter   Received notification from CoverMyMeds that prior authorization for Pantoprazole Sodium 40MG  dr tablets is required/requested.   Insurance verification completed.   The patient is insured through [facility] .   Per test claim: PA required; PA [place] to [employer] via CoverMyMeds Key/confirmation #/EOC [id number] Status is pending

## 2023-07-29 ENCOUNTER — Other Ambulatory Visit (HOSPITAL_COMMUNITY): Payer: Self-pay

## 2023-07-29 NOTE — Telephone Encounter (Signed)
Pharmacy Patient Advocate Encounter   Received notification from CoverMyMeds that prior authorization for Pantoprazole Sodium 40Mg  dr tab is required/requested.   Insurance verification completed.   The patient is insured through [facility] .   Per test claim: APPROVED from 07/28/2023 to 07/27/2024. Ran test claim, Copay is $3.77. This test claim was processed through [facility]- copay amounts may vary at other pharmacies due to pharmacy/plan contracts, or as the patient moves through the different stages of their insurance plan.

## 2023-07-29 NOTE — Telephone Encounter (Signed)
Pharmacy Patient Advocate Encounter  Received notification from [facility] that Prior Authorization for Pantoprazole 40MG   has been  Approved  from 07/28/23 to 07/27/24

## 2023-08-04 ENCOUNTER — Telehealth: Payer: Self-pay | Admitting: Pharmacist

## 2023-08-04 DIAGNOSIS — E663 Overweight: Secondary | ICD-10-CM

## 2023-08-04 DIAGNOSIS — Z6828 Body mass index (BMI) 28.0-28.9, adult: Secondary | ICD-10-CM

## 2023-08-04 MED ORDER — ZEPBOUND 5 MG/0.5ML ~~LOC~~ SOAJ: 5.0000 mg | SUBCUTANEOUS | 11 refills | Status: DC

## 2023-08-04 NOTE — Telephone Encounter (Signed)
Pt called clinic asking for Melissa. States she goes a few weeks off of Zepbound so that her constipation can resolve, then resumes the med for a month, then repeats. Has done this increasing from 2.5mg  to 5mg  and tolerated the 5mg  dose ok. Discussed that probably wouldn't want to increase her dose as starting on a higher dose after being off the med for a few weeks would likely make GI issues worse. Ok to stay at 5mg  dose with her current dosing regimen as it's working for her. Refill sent in.

## 2023-08-07 ENCOUNTER — Ambulatory Visit (HOSPITAL_BASED_OUTPATIENT_CLINIC_OR_DEPARTMENT_OTHER): Payer: BC Managed Care – PPO | Admitting: Family

## 2023-08-07 ENCOUNTER — Ambulatory Visit: Payer: BC Managed Care – PPO | Admitting: Physician Assistant

## 2023-08-07 ENCOUNTER — Encounter (HOSPITAL_BASED_OUTPATIENT_CLINIC_OR_DEPARTMENT_OTHER): Payer: Self-pay | Admitting: Family

## 2023-08-07 VITALS — BP 106/72 | HR 78 | Ht 64.0 in | Wt 151.0 lb

## 2023-08-07 DIAGNOSIS — I2089 Other forms of angina pectoris: Secondary | ICD-10-CM | POA: Diagnosis not present

## 2023-08-07 DIAGNOSIS — R072 Precordial pain: Secondary | ICD-10-CM

## 2023-08-07 DIAGNOSIS — E7849 Other hyperlipidemia: Secondary | ICD-10-CM

## 2023-08-07 DIAGNOSIS — M314 Aortic arch syndrome [Takayasu]: Secondary | ICD-10-CM

## 2023-08-07 DIAGNOSIS — I959 Hypotension, unspecified: Secondary | ICD-10-CM

## 2023-08-07 DIAGNOSIS — Q256 Stenosis of pulmonary artery: Secondary | ICD-10-CM

## 2023-08-07 DIAGNOSIS — R6889 Other general symptoms and signs: Secondary | ICD-10-CM

## 2023-08-07 MED ORDER — ZEPBOUND 5 MG/0.5ML ~~LOC~~ SOAJ: 5.0000 mg | SUBCUTANEOUS | 11 refills | Status: DC

## 2023-08-07 MED ORDER — MIDODRINE HCL 2.5 MG PO TABS: ORAL_TABLET | ORAL | 3 refills | Status: DC

## 2023-08-07 NOTE — Progress Notes (Signed)
Cardiology Office Note:  .   Date:  08/07/2023  ID:  LATONDA FAMBROUGH, DOB 1976/09/24, MRN [id number] PCP: Marisue Ivan, MD  [facility] Providers Cardiologist:  Jodelle Red, MD    History of Present Illness: .   Kara Anderson is a 47 y.o. female with a hx of takayasu arteriolitis and rheumatoid arthritis on infliximab followed by rheumatology, pulmonary stenosis s/p stenting 08/2018, mediastinal mass (11/2017 excise c/b elevated L hemidiaphragm), hyperlipidemia, microvascular dysfunction diagnosed at [facility] with chronic angina, hypersensitivity pneumonitis with Farmer's Lung, familial hyperlipidemia.    Previously followed by [facility] cardiology. Chest CT 07/2019 with patent pulmonary artery stent and no significant CAD. Seen at [facility] clinic and diagnosed with microvascular dysfunction due to persistent substernal pain.    Has followed closely with Dr. Shari Prows due to microvascular angina. October 2022 due to worsening angina she was thought to becoming nitrate tolerant and nitroglycerin patch reduced with initiated on Amlodipine, Metoprolol. She was eventually transitioned from Amlodipine to Diltiazem but had hypotension. Previous medical therapy 02/2022 as high as Imdur 120mg  BID, Ranexa 1000mg  BID, and nitro patch 0.3mg  QD. Metoprolol previously discontinued due to fatigue.    Admitted 04/2022 with pneumonia. Follows with Dr. Karna Christmas due to hypersensitivity pneumonitis with Farmer's lung (abnormal CT with ground glass infiltrates). Treated with Itraconazole x 6 months and prednisone.   Seen 06/2022 persistent discomfort. Subsequent cardiac PET with normal LVEF, normal MBF with appropriate augmentation with stress. 10/2022 Nexlizet added to Repatha.   She saw Dr. Shari Prows 05/06/23 noting chest pain with exertion or significant stress. She was adjusting her nitroglycerin patch with symptoms with only rare hypotension.   She saw Jari Favre, [place] 07/23/23 noting hypotension which  had required her to stop nitroglycerin patch, Amlodipine, and reduce Imdur with resumption of chest pain. Protonix added in case of GI element. Encouraged to increase hydration, electrolytes, liberalize salt to prevent hypotension.   Presents today for follow up. Presently on HRT per provider at [facility]. Notes recent palpitation occasionally while laying down. Does note her [facility] MD recommended she reduce her thyroid medication. Last week took Imdur 60mg  BID. Sun, Mon 90mg  BID. Now on 90mg  AM and 60mg  PM for the last three days. SBP at home 90-100s. Least amount of chest pain when previously on Imdur 120mg  BID, but presently even with 90mg  BID experiencing symptomatic hypotension despite Liquid IV twice per day and liberalizing salt.   Does note immunosuppressive medications are working well. Has had resolution of joint pain. Able to eat small amounts of sugar, refined carbs without flare. I do wonder if the lower inflammation has lowered her BP as well.   Has been working on weight loss with goal of 145 lbs. She is taking Zepbound 5mg  intermittently due to constipation. Following with our pharmacy team. Discussed that weight loss could make her BP lower with Imdur.   ROS: Please see the history of present illness.    All other systems reviewed and are negative.   Studies Reviewed: .        Cardiac Studies & Procedures     STRESS TESTS  NM PET CT CARDIAC PERFUSION MULTI W/ABSOLUTE BLOODFLOW 08/19/2022  Narrative   LV perfusion is normal. There is no evidence of ischemia. There is no evidence of infarction.   Rest left ventricular function is normal. Rest EF: 58 %. Stress left ventricular function is normal. Stress EF: 70 %. End diastolic cavity size is normal. End systolic cavity size is  normal.   Myocardial blood flow was computed to be 0.21ml/g/min at rest and 3.92ml/g/min at stress. Global myocardial blood flow reserve was 3.32 and was normal.   Coronary calcium was absent on the  attenuation correction CT images.   The study is normal. The study is low risk.  Electronically signed by Lennie Odor, MD _____________________________________________________________________________________________________ Francia Greaves: OVER-READ INTERPRETATION  CT CHEST  The following report is a limited chest CT over-read performed by radiologist Dr. Myles Rosenthal of [facility] on 08/19/2022. This over-read does not include interpretation of cardiac or coronary anatomy or pathology, nor does it include evaluation of the PET data. The cardiac PET-CT interpretation by the cardiologist is attached.  COMPARISON:  None Available.  FINDINGS: Endovascular 3stent noted in the proximal left pulmonary artery. No suspicious nodules, masses, or infiltrates are identified in the visualized portion of the lungs. No pleural fluid seen.  The visualized portions of the mediastinum and chest wall are unremarkable.  IMPRESSION: No significant non-cardiac abnormality identified.   Electronically Signed By: Danae Orleans M.D. On: 08/19/2022 13:47   ECHOCARDIOGRAM  ECHOCARDIOGRAM COMPLETE 05/29/2022  Narrative ECHOCARDIOGRAM REPORT    Patient Name:   RAMIAH MININNI Kreher Date of Exam: 05/29/2022 Medical Rec #:  [id number]     Height:       63.0 in Accession #:    [id number]    Weight:       167.2 lb Date of Birth:  26-Apr-1976     BSA:          1.792 m Patient Age:    45 years      BP:           98/52 mmHg Patient Gender: F             HR:           75 bpm. Exam Location:  [address]  Procedure: 2D Echo, 3D Echo, Cardiac Doppler, Color Doppler and Strain Analysis  Indications:    I20.8 Microvascular angina  History:        Patient has prior history of Echocardiogram examinations, most recent 01/19/2020. Arrythmias:SVT, Signs/Symptoms:Shortness of Breath; Risk Factors:Dyslipidemia. Pulmonary artery stenosis s/p stenting.  Sonographer:    Jorje Guild BS, RDCS Referring Phys: [id number]  HEATHER E [facility]  IMPRESSIONS   1. Global longitudinal strain is -23.6%. Left ventricular ejection fraction, by estimation, is 65 to 70%. The left ventricle has normal function. The left ventricle has no regional wall motion abnormalities. Left ventricular diastolic parameters were normal. 2. Right ventricular systolic function is normal. The right ventricular size is normal. 3. The mitral valve is normal in structure. Trivial mitral valve regurgitation. 4. The aortic valve is normal in structure. Aortic valve regurgitation is not visualized. 5. The inferior vena cava is normal in size with greater than 50% respiratory variability, suggesting right atrial pressure of 3 mmHg.  Comparison(s): The left ventricular function is unchanged. 01/19/20 EF 55-60%.  FINDINGS Left Ventricle: Global longitudinal strain is -23.6%. Left ventricular ejection fraction, by estimation, is 65 to 70%. The left ventricle has normal function. The left ventricle has no regional wall motion abnormalities. The left ventricular internal cavity size was normal in size. There is no left ventricular hypertrophy. Left ventricular diastolic parameters were normal.  Right Ventricle: The right ventricular size is normal. Right vetricular wall thickness was not assessed. Right ventricular systolic function is normal.  Left Atrium: Left atrial size was normal in size.  Right Atrium: Right atrial size was normal  in size.  Pericardium: There is no evidence of pericardial effusion.  Mitral Valve: The mitral valve is normal in structure. Trivial mitral valve regurgitation.  Tricuspid Valve: The tricuspid valve is normal in structure. Tricuspid valve regurgitation is trivial.  Aortic Valve: The aortic valve is normal in structure. Aortic valve regurgitation is not visualized.  Pulmonic Valve: The pulmonic valve was not well visualized. Pulmonic valve regurgitation is not visualized. No evidence of pulmonic  stenosis.  Aorta: The aortic root and ascending aorta are structurally normal, with no evidence of dilitation.  Venous: The inferior vena cava is normal in size with greater than 50% respiratory variability, suggesting right atrial pressure of 3 mmHg.  IAS/Shunts: No atrial level shunt detected by color flow Doppler.   LEFT VENTRICLE PLAX 2D LVIDd:         4.60 cm   Diastology LVIDs:         2.90 cm   LV e' medial:    13.10 cm/s LV PW:         0.80 cm   LV E/e' medial:  4.8 LV IVS:        0.70 cm   LV e' lateral:   17.50 cm/s LVOT diam:     2.30 cm   LV E/e' lateral: 3.6 LV SV:         103 LV SV Index:   58        2D Longitudinal Strain LVOT Area:     4.15 cm  2D Strain GLS (A2C):   -24.8 % 2D Strain GLS (A3C):   -21.6 % 2D Strain GLS (A4C):   -24.0 % 2D Strain GLS Avg:     -23.5 %  3D Volume EF: 3D EF:        62 % LV EDV:       121 ml LV ESV:       46 ml LV SV:        75 ml  RIGHT VENTRICLE             IVC RV Basal diam:  2.70 cm     IVC diam: 1.40 cm RV S prime:     11.60 cm/s TAPSE (M-mode): 2.2 cm  LEFT ATRIUM             Index        RIGHT ATRIUM           Index LA diam:        3.30 cm 1.84 cm/m   RA Pressure: 3.00 mmHg LA Vol (A2C):   30.8 ml 17.19 ml/m  RA Area:     13.90 cm LA Vol (A4C):   30.6 ml 17.08 ml/m  RA Volume:   33.50 ml  18.70 ml/m LA Biplane Vol: 30.5 ml 17.02 ml/m AORTIC VALVE LVOT Vmax:   141.00 cm/s LVOT Vmean:  86.900 cm/s LVOT VTI:    0.249 m  AORTA Ao Root diam: 3.20 cm Ao Asc diam:  3.50 cm  MITRAL VALVE               TRICUSPID VALVE Estimated RAP:  3.00 mmHg MV Decel Time: 243 msec MV E velocity: 62.40 cm/s  SHUNTS MV A velocity: 62.40 cm/s  Systemic VTI:  0.25 m MV E/A ratio:  1.00        Systemic Diam: 2.30 cm  Dietrich Pates MD Electronically signed by Dietrich Pates MD Signature Date/Time: 05/29/2022/5:36:42 PM    Final  Risk Assessment/Calculations:             Physical Exam:   VS:  BP 106/72   Pulse 78    Ht 5\' 4"  (1.626 m)   Wt 151 lb (68.5 kg)   LMP 09/21/2022 (Exact Date)   BMI 25.92 kg/m    Wt Readings from Last 3 Encounters:  08/07/23 151 lb (68.5 kg)  07/23/23 150 lb (68 kg)  05/06/23 162 lb 6.4 oz (73.7 kg)    GEN: Well nourished, well developed in no acute distress NECK: No JVD; No carotid bruits CARDIAC: RRR, no murmurs, rubs, gallops RESPIRATORY:  Clear to auscultation without rales, wheezing or rhonchi  ABDOMEN: Soft, non-tender, non-distended EXTREMITIES:  No edema; No deformity   ASSESSMENT AND PLAN: .    Microvascular angina / Hypotension - Diagnosed at [facility] clinic. Has previously trialed multiple regimens. Did not tolerate Metoprolol (fatigue), Diltiazem (hypotension). Previous anti-anginal regimen as high as Imdur 120mg  BID, Ranexa 1,000mg  BID, nitroglycerin patch 0.3mg , Amlodipine 2.5mg  daily. Previously took breaks in her nitroglycerin path to prevent tolerance. Presently tolerating only Imdur 90mg  AM and 60mg  PM with breakthrough anginal symptoms and persistent hypotension despite increasing electrolytes, liberalizing salt. Rx Midodrine 2.5mg  with breakfast and lunch, evening dose PRN for BP <110/60. Hopeful this will allow for optimization of antianginal benefit to increase Imdur to 120mg  BID. She will gradually increase as BP allows. If BP permits and still with anginal symptoms at follow up, consider resumption of Amlodipine 2.5mg  at bedtime.   Pulmonary artery stenosis S/p pulmonary artery stent [id number] - patent by cardiac PET 08/2022.   Familial hyperlipidemia - 10/2022 Lp(a) 216.3. Statin intolerance to Crestor. On Repatha, Nexlizet. 04/22/23 Apo(b) 53, total cholesterol 144, HDL 70, LDL63, triglycerides 53.   Takayatsu Arteritis / RA - Follows with rheumatology  Pneumonitis and farmer's Lung - Follows with Dr. Karna Christmas.        Dispo: follow up as scheduled with Dr. Cristal Deer  Signed, Alver Sorrow, NP

## 2023-08-07 NOTE — Addendum Note (Signed)
Addended by: Malena Peer D on: 08/07/2023 10:12 AM   Modules accepted: Orders

## 2023-08-07 NOTE — Patient Instructions (Addendum)
Medication Instructions:  Your physician has recommended you make the following change in your medication:   Start: Midodrine 2.5mg  daily at breakfast and lunch   Follow-Up: At [facility], you and your health needs are our priority.  As part of our continuing mission to provide you with exceptional heart care, we have created designated Provider Care Teams.  These Care Teams include your primary Cardiologist (physician) and Advanced Practice Providers (APPs -  Physician Assistants and Nurse Practitioners) who all work together to provide you with the care you need, when you need it.  We recommend signing up for the patient portal called "MyChart".  Sign up information is provided on this After Visit Summary.  MyChart is used to connect with patients for Virtual Visits (Telemedicine).  Patients are able to view lab/test results, encounter notes, upcoming appointments, etc.  Non-urgent messages can be sent to your provider as well.   To learn more about what you can do with MyChart, go to [email or web address].    Your next appointment:   Follow up as scheduled with Dr. Cristal Deer   Other Instructions  To prevent palpitations: Make sure you are adequately hydrated.  Avoid and/or limit caffeine containing beverages like soda or tea. Exercise regularly.  Manage stress well. Some over the counter medications can cause palpitations such as Benadryl, AdvilPM, TylenolPM. Regular Advil or Tylenol do not cause palpitations.

## 2023-10-08 ENCOUNTER — Ambulatory Visit
Admission: RE | Admit: 2023-10-08 | Discharge: 2023-10-08 | Disposition: A | Discharge status: Home / Self Care | Payer: Medicare HMO | Source: Ambulatory Visit | Attending: Family Medicine | Admitting: Family Medicine

## 2023-10-08 DIAGNOSIS — Z1231 Encounter for screening mammogram for malignant neoplasm of breast: Secondary | ICD-10-CM

## 2023-10-14 ENCOUNTER — Encounter: Payer: Self-pay | Admitting: Pharmacist

## 2023-10-14 ENCOUNTER — Telehealth: Payer: Self-pay | Admitting: Cardiology

## 2023-10-14 NOTE — Telephone Encounter (Addendum)
PA for Repatha has been approved through 12/15/23

## 2023-10-14 NOTE — Telephone Encounter (Signed)
Pt c/o medication issue:  1. Name of Medication:  Evolocumab (REPATHA SURECLICK) 140 MG/ML SOAJ  2. How are you currently taking this medication (dosage and times per day)?   3. Are you having a reaction (difficulty breathing--STAT)?   4. What is your medication issue?   Patient is requesting to speak directly with Kara Anderson if at all possible. She states [employer] notified her that Repatha will not be covered, but she also got a notification about a copay card for 2025. She would like to discuss her eligibility.

## 2023-10-14 NOTE — Telephone Encounter (Signed)
She needs a PA since she changed to [employer]. She should qualify also for [employer] grant. I will need copy of insurance card to send to [employer]well since she is <65. She will send via mychart. Pa submitted Key: [id number]  [employer] grant approved conditionally after we send in medicare verification  Card No. [id number] Card Status Active BIN [id number] PCN [id number] PC Group [id number]

## 2023-10-14 NOTE — Telephone Encounter (Signed)
Will have Pharm D address

## 2023-10-14 NOTE — Telephone Encounter (Signed)
Patient made aware of approval. [employer] grant info sent via mychart and she will send copy of medicare card.

## 2023-10-15 ENCOUNTER — Other Ambulatory Visit: Payer: Self-pay | Admitting: Pharmacist

## 2023-10-15 DIAGNOSIS — E7849 Other hyperlipidemia: Secondary | ICD-10-CM

## 2023-10-15 MED ORDER — REPATHA SURECLICK 140 MG/ML ~~LOC~~ SOAJ: 1.0000 mL | SUBCUTANEOUS | 1 refills | Status: DC

## 2023-11-01 NOTE — Progress Notes (Signed)
Cardiology Office Note:  .   Date:  11/02/2023  ID:  Kara Anderson, DOB 1976/05/03, MRN [id number] PCP: Marisue Ivan, MD  [facility] Providers Cardiologist:  Jodelle Red, MD {  History of Present Illness: .   Kara Anderson is a 47 y.o. female with PMH vasculitis (Takayasu), rheumatoid arthritis, L pulmonary artery stenosis s/p stent 2019, microvascular dysfunction with chronic angina, pSVT, hypersensitivity pneumonitis. She previously followed with Dr. Shari Prows and established care with me on 11/02/23  Pertinent CV history: Diagnosed with microvascular angina at the [facility] in 2020. Had been treated previously with nitroglycerin, and then treated with amlodipine and metoprolol. She was transitioned to diltiazem but had hypotension. Metoprolol stopped due to fatigue. Has been treated with Imdur 120mg  BID, Ranexa 1000mg  BID, and nitro patch 0.3mg  every day. 06/2022 persistent discomfort. Subsequent cardiac PET with normal LVEF, normal MBF with appropriate augmentation with stress. 10/2022 Nexlizet added to Repatha. Since that time, have had to titrate down medications due to hypotension.  Today  Doing well today. Has been working with Gillian Shields on her blood pressure. This started when her infusion changed. Has dizziness with bending over or turning her head too quickly. Had to adjust her meds to try not to have her be hypotensive, and she has had some chest pain with this. Has had a lot of stress as well, not sleeping as well.   Up to a few weeks ago (with increase in her stress), she was doing well with midodrine and 90 mg imdur. Ranolazine has been stable dosing.  On repatha, aspirin, nexlizet. Not currently taking zepbound due to constipation.   Eating healthy, trying to stay active. Discussed staying hydrated as well, drinks 1-1.5 gallons water/day. Discussed compression stockings as well.  She was very frustrated after her visit at [facility] as she was told  by the DOD (Dr. Mayford Knife) that she probably did not have microvascular angina and was started on PPI. She had no change in her symptoms on this.  ROS: Denies shortness of breath at rest or with normal exertion. No PND, orthopnea, LE edema or unexpected weight gain. No syncope or palpitations. ROS otherwise negative except as noted.   Studies Reviewed: Marland Kitchen    EKG:  EKG Interpretation Date/Time:  Monday November 02 2023 10:45:27 EST Ventricular Rate:  83 PR Interval:  148 QRS Duration:  74 QT Interval:  364 QTC Calculation: 427 R Axis:   75  Text Interpretation: Normal sinus rhythm Possible Left atrial enlargement When compared with ECG of 23-Jul-2023 11:21, No significant change was found Confirmed by Jodelle Red [phone]) on 11/02/2023 11:06:10 AM    Physical Exam:   VS:  BP 108/66 (BP Location: Left Arm, Patient Position: Sitting, Cuff Size: Normal)   Pulse 83   Ht 5\' 4"  (1.626 m)   Wt 154 lb 12.8 oz (70.2 kg)   LMP 09/21/2022 (Exact Date)   SpO2 97%   BMI 26.57 kg/m    Wt Readings from Last 3 Encounters:  11/02/23 154 lb 12.8 oz (70.2 kg)  08/07/23 151 lb (68.5 kg)  07/23/23 150 lb (68 kg)    GEN: Well nourished, well developed in no acute distress HEENT: Normal, moist mucous membranes NECK: No JVD CARDIAC: regular rhythm, normal S1 and S2, no rubs or gallops. No murmur. VASCULAR: Radial and DP pulses 2+ bilaterally. No carotid bruits RESPIRATORY:  Clear to auscultation without rales, wheezing or rhonchi  ABDOMEN: Soft, non-tender, non-distended MUSCULOSKELETAL:  Ambulates independently SKIN:  Warm and dry, no edema NEUROLOGIC:  Alert and oriented x 3. No focal neuro deficits noted. PSYCHIATRIC:  Normal affect    ASSESSMENT AND PLAN: .    Microvascular angina -see history above  Lightheadedness with changing position -hydrating well -discussed compression stockings today -using midodrine 5 mg (two 2.5 mg pills BID) in order to tolerate imdur. Feels worse when  she can't take imdur -discussed that once her symptoms are improving, can try to cut back to 2.5 mg BID midodrine with compression stockings  Pulmonary artery stenosis s/p L PA stent 2019 -followed by [facility] arteritis/rheumatoid arthritis -follows with rheumatology  Hyperlipidemia -continue repatha, bemedoic acid-ezetimibe -last ldl 57  CV risk counseling and prevention -recommend heart healthy/Mediterranean diet, with whole grains, fruits, vegetable, fish, lean meats, nuts, and olive oil. Limit salt. -recommend moderate walking, 3-5 times/week for 30-50 minutes each session. Aim for at least 150 minutes.week. Goal should be pace of 3 miles/hours, or walking 1.5 miles in 30 minutes -recommend avoidance of tobacco products. Avoid excess alcohol. -ASCVD risk score: The 10-year ASCVD risk score (Arnett DK, et al., 2019) is: 0.2%   Values used to calculate the score:     Age: 70 years     Sex: Female     Is Non-Hispanic African American: No     Diabetic: No     Tobacco smoker: No     Systolic Blood Pressure: 108 mmHg     Is BP treated: No     HDL Cholesterol: 87 mg/dL     Total Cholesterol: 144 mg/dL    Dispo: 3 mos with Luther Parody, 6 mos with me  Signed, Jodelle Red, MD   Jodelle Red, MD, PhD, [facility]  [facility]  [place]  Heart & Vascular at [facility] [address], [address] [place], [place] [address] [phone]

## 2023-11-02 ENCOUNTER — Encounter (HOSPITAL_BASED_OUTPATIENT_CLINIC_OR_DEPARTMENT_OTHER): Payer: Self-pay | Admitting: Cardiology

## 2023-11-02 ENCOUNTER — Ambulatory Visit (HOSPITAL_BASED_OUTPATIENT_CLINIC_OR_DEPARTMENT_OTHER): Payer: Medicare HMO | Admitting: Cardiology

## 2023-11-02 VITALS — BP 108/66 | HR 83 | Ht 64.0 in | Wt 154.8 lb

## 2023-11-02 DIAGNOSIS — I2089 Other forms of angina pectoris: Secondary | ICD-10-CM

## 2023-11-02 DIAGNOSIS — R6889 Other general symptoms and signs: Secondary | ICD-10-CM

## 2023-11-02 DIAGNOSIS — Q256 Stenosis of pulmonary artery: Secondary | ICD-10-CM

## 2023-11-02 DIAGNOSIS — E7849 Other hyperlipidemia: Secondary | ICD-10-CM

## 2023-11-02 DIAGNOSIS — M314 Aortic arch syndrome [Takayasu]: Secondary | ICD-10-CM

## 2023-11-02 NOTE — Patient Instructions (Signed)
Medication Instructions:  Your physician recommends that you continue on your current medications as directed. Please refer to the Current Medication list given to you today.  *If you need a refill on your cardiac medications before your next appointment, please call your pharmacy*  Lab Work: NONE  Testing/Procedures: NONE  Follow-Up: At [facility], you and your health needs are our priority.  As part of our continuing mission to provide you with exceptional heart care, we have created designated Provider Care Teams.  These Care Teams include your primary Cardiologist (physician) and Advanced Practice Providers (APPs -  Physician Assistants and Nurse Practitioners) who all work together to provide you with the care you need, when you need it.  We recommend signing up for the patient portal called "MyChart".  Sign up information is provided on this After Visit Summary.  MyChart is used to connect with patients for Virtual Visits (Telemedicine).  Patients are able to view lab/test results, encounter notes, upcoming appointments, etc.  Non-urgent messages can be sent to your provider as well.   To learn more about what you can do with MyChart, go to [email or web address].    Your next appointment:   6 month(s)  The format for your next appointment:   In Person  Provider:   Jodelle Red, MD   3 months with Ronn Melena NP

## 2023-11-04 ENCOUNTER — Telehealth: Payer: Self-pay | Admitting: Pharmacy Technician

## 2023-11-04 ENCOUNTER — Other Ambulatory Visit (HOSPITAL_COMMUNITY): Payer: Self-pay

## 2023-11-04 NOTE — Telephone Encounter (Signed)
Pharmacy Patient Advocate Encounter  Received notification from  [employer] part [employer]  that Prior Authorization for zepbound has been DENIED.  Full denial letter will be uploaded to the media tab. See denial reason below.   PA #/Case ID/Reference #: [id number]

## 2023-11-04 NOTE — Telephone Encounter (Signed)
Pharmacy Patient Advocate Encounter   Received notification from CoverMyMeds that prior authorization for zepbound is required/requested.   Insurance verification completed.   The patient is insured through [employer] part d.   Per test claim: PA required; PA submitted to above mentioned insurance via CoverMyMeds Key/confirmation #/EOC [id number] Status is pending

## 2023-11-23 ENCOUNTER — Other Ambulatory Visit: Payer: Self-pay | Admitting: Pharmacist

## 2023-11-23 DIAGNOSIS — E7849 Other hyperlipidemia: Secondary | ICD-10-CM

## 2023-11-23 MED ORDER — REPATHA SURECLICK 140 MG/ML ~~LOC~~ SOAJ: 1.0000 mL | SUBCUTANEOUS | 1 refills | Status: DC

## 2024-02-02 ENCOUNTER — Encounter (HOSPITAL_BASED_OUTPATIENT_CLINIC_OR_DEPARTMENT_OTHER): Payer: Self-pay | Admitting: Family

## 2024-02-02 ENCOUNTER — Ambulatory Visit (HOSPITAL_BASED_OUTPATIENT_CLINIC_OR_DEPARTMENT_OTHER): Payer: Medicare HMO | Admitting: Family

## 2024-02-02 VITALS — BP 102/70 | HR 67 | Ht 64.0 in | Wt 159.7 lb

## 2024-02-02 DIAGNOSIS — I2089 Other forms of angina pectoris: Secondary | ICD-10-CM | POA: Diagnosis not present

## 2024-02-02 DIAGNOSIS — E039 Hypothyroidism, unspecified: Secondary | ICD-10-CM | POA: Diagnosis not present

## 2024-02-02 DIAGNOSIS — I952 Hypotension due to drugs: Secondary | ICD-10-CM

## 2024-02-02 DIAGNOSIS — E7849 Other hyperlipidemia: Secondary | ICD-10-CM | POA: Diagnosis not present

## 2024-02-02 MED ORDER — MIDODRINE HCL 2.5 MG PO TABS: ORAL_TABLET | ORAL | 1 refills | Status: DC

## 2024-02-02 NOTE — Progress Notes (Signed)
Cardiology Office Note:  .   Date:  02/02/2024  ID:  Kara Anderson, DOB 02-09-1976, MRN [id number] PCP: Marisue Ivan, MD  [facility] Providers Cardiologist:  Jodelle Red, MD    History of Present Illness: .   Kara Anderson is a 48 y.o. female with a hx of takayasu arteriolitis and rheumatoid arthritis on infliximab followed by rheumatology, pulmonary stenosis s/p stenting 08/2018, mediastinal mass (11/2017 excise c/b elevated L hemidiaphragm), hyperlipidemia, microvascular dysfunction diagnosed at [facility] with chronic angina, hypersensitivity pneumonitis with Farmer's Lung, familial hyperlipidemia.    Previously followed by [facility] cardiology. Chest CT 07/2019 with patent pulmonary artery stent and no significant CAD. Seen at [facility] clinic and diagnosed with microvascular dysfunction due to persistent substernal pain.    Has followed closely with Dr. Shari Prows due to microvascular angina. Has since transitioned care to Dr. Cristal Deer. Did not tolerate Amlodipine or Diltiazem but had hypotension. Metoprolol previously discontinued due to fatigue.    Follows with Dr. Karna Christmas due to hypersensitivity pneumonitis with Farmer's lung (abnormal CT with ground glass infiltrates).   Seen 06/2022 persistent discomfort. Subsequent cardiac PET with normal LVEF, normal MBF with appropriate augmentation with stress. 10/2022 Nexlizet added to Repatha for familial hyperlipidemia.  Presents today for follow up inependently. She saw PCP yesterday with persistent congested cough as well as some ear pain. She was started on a course of Augmentin and CXR performed, results not yet available. [place] again but there is one hill that is particularly difficult. This was also around the time her respiratory illness, she is not certain if it is recovery from infection or her angina. BP over the weekends 85/62. Has reduced Midodrine to 2.5mg  BID (7:30 am and 4 pm) and been wearing  compression stockings most days. Still feels "washed out"  ROS: Please see the history of present illness.    All other systems reviewed and are negative.   Studies Reviewed: .           Risk Assessment/Calculations:             Physical Exam:   VS:  BP 102/70   Pulse 67   Ht 5\' 4"  (1.626 m)   Wt 159 lb 11.2 oz (72.4 kg)   LMP 09/21/2022 (Exact Date)   SpO2 99%   BMI 27.41 kg/m    Wt Readings from Last 3 Encounters:  02/02/24 159 lb 11.2 oz (72.4 kg)  11/02/23 154 lb 12.8 oz (70.2 kg)  08/07/23 151 lb (68.5 kg)    GEN: Well nourished, well developed in no acute distress NECK: No JVD; No carotid bruits CARDIAC: RRR, no murmurs, rubs, gallops RESPIRATORY:  Clear to auscultation without rales, wheezing or rhonchi  ABDOMEN: Soft, non-tender, non-distended EXTREMITIES:  No edema; No deformity   ASSESSMENT AND PLAN: .    Microvascular angina / Hypotension - Diagnosed at [facility] clinic. Has previously trialed multiple regimens. Did not tolerate Metoprolol (fatigue), Diltiazem (hypotension), Amlodipine (Hypotension). Previous anti-anginal regimen as high as Imdur 120mg  BID, Ranexa 1,000mg  BID, nitroglycerin patch 0.3mg . Previously took breaks in her nitroglycerin path to prevent tolerance. Presently tolerating Imdur 90mg  twice daily, Ranexa 1000 mg twice daily.  Has required Midodrine 2.5mg  BID for hypotension but feels "washed out". Will move up her second Midodrine dose from 4pm to lunchtime. If persistent hypotension, will adjust to Midodrine TID with meals.   Pulmonary artery stenosis S/p pulmonary artery stent 40981 - patent by cardiac PET 08/2022. Follows  with pulmonology.   Familial hyperlipidemia - 10/2022 Lp(a) 216.3. Statin intolerance to Crestor. On Repatha, Nexlizet. 04/22/23 Apo(b) 53, total cholesterol 144, HDL 70, LDL63, triglycerides 53.   Takayatsu Arteritis / RA - Follows with rheumatology. Improvement in symptoms since newest regimen.   Pneumonitis and farmer's Lung -  Follows with Dr. Karna Christmas of pulmonology       Dispo: follow up in 3-4 mos  Signed, Alver Sorrow, NP

## 2024-02-02 NOTE — Patient Instructions (Signed)
Medication Instructions:  Recommend moving your second dose of Midodrine to lunchtime. If you need a third dose, okay to take the evening with dinner.   *If you need a refill on your cardiac medications before your next appointment, please call your pharmacy*  Follow-Up: At [facility], you and your health needs are our priority.  As part of our continuing mission to provide you with exceptional heart care, we have created designated Provider Care Teams.  These Care Teams include your primary Cardiologist (physician) and Advanced Practice Providers (APPs -  Physician Assistants and Nurse Practitioners) who all work together to provide you with the care you need, when you need it.  We recommend signing up for the patient portal called "MyChart".  Sign up information is provided on this After Visit Summary.  MyChart is used to connect with patients for Virtual Visits (Telemedicine).  Patients are able to view lab/test results, encounter notes, upcoming appointments, etc.  Non-urgent messages can be sent to your provider as well.   To learn more about what you can do with MyChart, go to [email or web address].    Your next appointment:   3-4 month(s)  Provider:   Jodelle Red, MD or Gillian Shields, NP

## 2024-02-24 ENCOUNTER — Telehealth: Payer: Self-pay | Admitting: Pharmacy Technician

## 2024-02-24 ENCOUNTER — Other Ambulatory Visit (HOSPITAL_COMMUNITY): Payer: Self-pay

## 2024-02-24 NOTE — Telephone Encounter (Signed)
 Pharmacy Patient Advocate Encounter   Received notification from CoverMyMeds that prior authorization for Nexlizet is required/requested.   Insurance verification completed.   The patient is insured through [employer] .   Per test claim: The current 02/24/24 day co-pay is, $0.00- one month.  No PA needed at this time. This test claim was processed through [facility]- copay amounts may vary at other pharmacies due to pharmacy/plan contracts, or as the patient moves through the different stages of their insurance plan.

## 2024-03-25 ENCOUNTER — Telehealth (HOSPITAL_BASED_OUTPATIENT_CLINIC_OR_DEPARTMENT_OTHER): Payer: Self-pay | Admitting: *Deleted

## 2024-03-25 ENCOUNTER — Other Ambulatory Visit: Payer: Self-pay | Admitting: Pulmonary Disease

## 2024-03-25 NOTE — Telephone Encounter (Signed)
   Pre-operative Risk Assessment    Patient Name: Kara Anderson  DOB: September 06, 1976 MRN: [id number]   Date of last office visit: 02/02/2024 Ronn Melena NP  Date of next office visit: 06/10/2024    Request for Surgical Clearance    Procedure:   BAL/FLEXIBLE BRONCHOSCOPY   Date of Surgery:  Clearance 04/01/24                                Surgeon:  Wellington Hampshire  Surgeon's Group or Practice Name:  [facility] - PULMONARY  Phone number:  [phone]  Fax number:  [phone]   Type of Clearance Requested:   - Medical    Type of Anesthesia:  General    Additional requests/questions:      SignedRegis Bill   03/25/2024, 3:28 PM

## 2024-03-25 NOTE — Telephone Encounter (Signed)
 Kara Anderson,  We have received a surgical clearance request for Kara Anderson for BAL/flexible bronchoscopy. They were seen recently in clinic on 02/02/2024. Can you please comment on surgical clearance for upcoming procedure. Please forward you guidance and recommendations to P CV DIV PREOP   Thank you, Robin Searing, NP

## 2024-03-25 NOTE — Telephone Encounter (Signed)
Exercise tolerance >4 METS. Per AHA/ACC guidelines, she is deemed acceptable risk for the planned procedure without additional cardiovascular testing.  Alver Sorrow, NP

## 2024-03-25 NOTE — Telephone Encounter (Signed)
   Patient Name: Kara Anderson  DOB: 03-20-1976 MRN: [id number]  Primary Cardiologist: Jodelle Red, MD  Chart reviewed as part of pre-operative protocol coverage. Given past medical history and time since last visit, based on ACC/AHA guidelines, Kara Anderson is at acceptable risk for the planned procedure without further cardiovascular testing.   The patient was advised that if she develops new symptoms prior to surgery to contact our office to arrange for a follow-up visit, and she verbalized understanding.  I will route this recommendation to the requesting party via Epic fax function and remove from pre-op pool.  Please call with questions.  Napoleon Form, Leodis Rains, NP 03/25/2024, 4:31 PM

## 2024-03-29 ENCOUNTER — Other Ambulatory Visit: Payer: Self-pay

## 2024-03-29 MED ORDER — NEXLIZET 180-10 MG PO TABS: 1.0000 | ORAL_TABLET | Freq: Every day | ORAL | 11 refills | Status: AC

## 2024-03-31 ENCOUNTER — Encounter
Admission: RE | Admit: 2024-03-31 | Discharge: 2024-03-31 | Disposition: A | Discharge status: Home / Self Care | Source: Ambulatory Visit | Attending: Pulmonary Disease | Admitting: Pulmonary Disease

## 2024-03-31 ENCOUNTER — Other Ambulatory Visit: Payer: Self-pay

## 2024-03-31 DIAGNOSIS — N951 Menopausal and female climacteric states: Secondary | ICD-10-CM

## 2024-03-31 DIAGNOSIS — R0789 Other chest pain: Secondary | ICD-10-CM

## 2024-03-31 DIAGNOSIS — I471 Supraventricular tachycardia, unspecified: Secondary | ICD-10-CM

## 2024-03-31 DIAGNOSIS — Z01812 Encounter for preprocedural laboratory examination: Secondary | ICD-10-CM

## 2024-03-31 HISTORY — DX: Hypothyroidism, unspecified: E03.9

## 2024-03-31 HISTORY — DX: Dyspnea, unspecified: R06.00

## 2024-03-31 NOTE — Patient Instructions (Addendum)
 Your procedure is scheduled on: 04/01/24 - Friday Report to the Registration Desk on the 1st floor of the Medical Mall. To find out your arrival time, please call [phone] between 1PM - 3PM on: 03/31/24 - Thursday If your arrival time is 6:00 am, do not arrive before that time as the Medical Mall entrance doors do not open until 6:00 am.  REMEMBER: Instructions that are not followed completely may result in serious medical risk, up to and including death; or upon the discretion of your surgeon and anesthesiologist your surgery may need to be rescheduled.  Do not eat food or drink any liquids after midnight the night before surgery.  No gum chewing or hard candies.   One week prior to surgery: Stop Anti-inflammatories (NSAIDS) such as Advil, Aleve, Ibuprofen, Motrin, Naproxen, Naprosyn and Aspirin based products such as Excedrin, Goody's Powder, BC Powder. You may take Tylenol if needed for pain up until the day of surgery.  Stop ANY OVER THE COUNTER supplements until after surgery.  Hold Aspirin per doctors orders.  ON THE DAY OF SURGERY ONLY TAKE THESE MEDICATIONS WITH SIPS OF WATER:    celecoxib (CELEBREX)  gabapentin (NEURONTIN)  isosorbide mononitrate (IMDUR)  liothyronine (CYTOMEL)  midodrine (PROAMATINE)  MIEBO  ranolazine (RANEXA)    No Alcohol for 24 hours before or after surgery.  No Smoking including e-cigarettes for 24 hours before surgery.  No chewable tobacco products for at least 6 hours before surgery.  No nicotine patches on the day of surgery.  Do not use any "recreational" drugs for at least a week (preferably 2 weeks) before your surgery.  Please be advised that the combination of cocaine and anesthesia may have negative outcomes, up to and including death. If you test positive for cocaine, your surgery will be cancelled.  On the morning of surgery brush your teeth with toothpaste and water, you may rinse your mouth with mouthwash if you wish. Do not  swallow any toothpaste or mouthwash.  Do not wear jewelry, make-up, hairpins, clips or nail polish.  For welded (permanent) jewelry: bracelets, anklets, waist bands, etc.  Please have this removed prior to surgery.  If it is not removed, there is a chance that hospital personnel will need to cut it off on the day of surgery.  Do not wear lotions, powders, or perfumes.   Do not shave body hair from the neck down 48 hours before surgery.  Contact lenses, hearing aids and dentures may not be worn into surgery.  Do not bring valuables to the hospital. [facility] is not responsible for any missing/lost belongings or valuables.   Notify your doctor if there is any change in your medical condition (cold, fever, infection).  Wear comfortable clothing (specific to your surgery type) to the hospital.  After surgery, you can help prevent lung complications by doing breathing exercises.  Take deep breaths and cough every 1-2 hours. Your doctor may order a device called an Incentive Spirometer to help you take deep breaths.  When coughing or sneezing, hold a pillow firmly against your incision with both hands. This is called "splinting." Doing this helps protect your incision. It also decreases belly discomfort.  If you are being admitted to the hospital overnight, leave your suitcase in the car. After surgery it may be brought to your room.  In case of increased patient census, it may be necessary for you, the patient, to continue your postoperative care in the Same Day Surgery department.  If you are  being discharged the day of surgery, you will not be allowed to drive home. You will need a responsible individual to drive you home and stay with you for 24 hours after surgery.   If you are taking public transportation, you will need to have a responsible individual with you.  Please call the Pre-admissions Testing Dept. at [phone] if you have any questions about these  instructions.  Surgery Visitation Policy:  Patients having surgery or a procedure may have two visitors.  Children under the age of 48 must have an adult with them who is not the patient.  Inpatient Visitation:    Visiting hours are 7 a.m. to 8 p.m. Up to four visitors are allowed at one time in a patient room. The visitors may rotate out with other people during the day.  One visitor age 48 or older may stay with the patient overnight and must be in the room by 8 p.m.

## 2024-04-01 ENCOUNTER — Other Ambulatory Visit: Payer: Self-pay | Admitting: Pulmonary Disease

## 2024-04-01 ENCOUNTER — Ambulatory Visit: Admitting: Anesthesiology

## 2024-04-01 ENCOUNTER — Other Ambulatory Visit

## 2024-04-01 ENCOUNTER — Ambulatory Visit

## 2024-04-01 ENCOUNTER — Encounter
Admission: RE | Disposition: A | Discharge status: Home / Self Care | Payer: Self-pay | Source: Home / Self Care | Attending: Pulmonary Disease

## 2024-04-01 ENCOUNTER — Ambulatory Visit
Admission: RE | Admit: 2024-04-01 | Discharge: 2024-04-01 | Disposition: A | Discharge status: Home / Self Care | Attending: Pulmonary Disease | Admitting: Pulmonary Disease

## 2024-04-01 DIAGNOSIS — F419 Anxiety disorder, unspecified: Secondary | ICD-10-CM | POA: Insufficient documentation

## 2024-04-01 DIAGNOSIS — N951 Menopausal and female climacteric states: Secondary | ICD-10-CM

## 2024-04-01 DIAGNOSIS — Z01812 Encounter for preprocedural laboratory examination: Secondary | ICD-10-CM

## 2024-04-01 DIAGNOSIS — R519 Headache, unspecified: Secondary | ICD-10-CM | POA: Insufficient documentation

## 2024-04-01 DIAGNOSIS — J189 Pneumonia, unspecified organism: Secondary | ICD-10-CM | POA: Diagnosis not present

## 2024-04-01 DIAGNOSIS — B49 Unspecified mycosis: Secondary | ICD-10-CM | POA: Diagnosis present

## 2024-04-01 DIAGNOSIS — K219 Gastro-esophageal reflux disease without esophagitis: Secondary | ICD-10-CM | POA: Insufficient documentation

## 2024-04-01 DIAGNOSIS — E039 Hypothyroidism, unspecified: Secondary | ICD-10-CM | POA: Diagnosis not present

## 2024-04-01 DIAGNOSIS — Z8701 Personal history of pneumonia (recurrent): Secondary | ICD-10-CM | POA: Diagnosis not present

## 2024-04-01 DIAGNOSIS — R0600 Dyspnea, unspecified: Secondary | ICD-10-CM | POA: Insufficient documentation

## 2024-04-01 DIAGNOSIS — I471 Supraventricular tachycardia, unspecified: Secondary | ICD-10-CM

## 2024-04-01 DIAGNOSIS — R0789 Other chest pain: Secondary | ICD-10-CM

## 2024-04-01 DIAGNOSIS — J168 Pneumonia due to other specified infectious organisms: Secondary | ICD-10-CM | POA: Diagnosis present

## 2024-04-01 DIAGNOSIS — R0602 Shortness of breath: Secondary | ICD-10-CM | POA: Insufficient documentation

## 2024-04-01 HISTORY — PX: FLEXIBLE BRONCHOSCOPY: SHX5094

## 2024-04-01 HISTORY — PX: BRONCHIAL WASHINGS: SHX5105

## 2024-04-01 LAB — BASIC METABOLIC PANEL WITH GFR
Anion gap: 8 (ref 5–15)
BUN: 19 mg/dL (ref 6–20)
CO2: 27 mmol/L (ref 22–32)
Calcium: 9.3 mg/dL (ref 8.9–10.3)
Chloride: 100 mmol/L (ref 98–111)
Creatinine, Ser: 0.74 mg/dL (ref 0.44–1.00)
GFR, Estimated: >60 mL/min (ref >60)
Glucose, Bld: 94 mg/dL (ref 70–99)
Potassium: 4 mmol/L (ref 3.5–5.1)
Sodium: 135 mmol/L (ref 135–145)

## 2024-04-01 LAB — CBC
HCT: 38.7 % (ref 36.0–46.0)
Hemoglobin: 13.1 g/dL (ref 12.0–15.0)
MCH: 31.6 pg (ref 26.0–34.0)
MCHC: 33.9 g/dL (ref 30.0–36.0)
MCV: 93.3 fL (ref 80.0–100.0)
Platelets: 294 10*3/uL (ref 150–400)
RBC: 4.15 MIL/uL (ref 3.87–5.11)
RDW: 12.1 % (ref 11.5–15.5)
WBC: 6.1 10*3/uL (ref 4.0–10.5)
nRBC: 0 % (ref 0.0–0.2)

## 2024-04-01 SURGERY — BRONCHOSCOPY, FLEXIBLE
Anesthesia: General

## 2024-04-01 SURGERY — Surgical Case
Anesthesia: *Unknown

## 2024-04-01 MED ORDER — ROCURONIUM BROMIDE 100 MG/10ML IV SOLN
INTRAVENOUS | Status: DC | PRN
  Administered 2024-04-01: 60 mg via INTRAVENOUS

## 2024-04-01 MED ORDER — FENTANYL CITRATE (PF) 100 MCG/2ML IJ SOLN
INTRAMUSCULAR | Status: AC
  Filled 2024-04-01: qty 2

## 2024-04-01 MED ORDER — FENTANYL CITRATE (PF) 100 MCG/2ML IJ SOLN: 25.0000 ug | INTRAMUSCULAR | Status: DC | PRN

## 2024-04-01 MED ORDER — SUGAMMADEX SODIUM 200 MG/2ML IV SOLN
INTRAVENOUS | Status: DC | PRN
  Administered 2024-04-01: 200 mg via INTRAVENOUS

## 2024-04-01 MED ORDER — PROPOFOL 10 MG/ML IV BOLUS
INTRAVENOUS | Status: AC
  Filled 2024-04-01: qty 20

## 2024-04-01 MED ORDER — CHLORHEXIDINE GLUCONATE 0.12 % MT SOLN
15.0000 mL | Freq: Once | OROMUCOSAL | Status: AC
  Administered 2024-04-01: 15 mL via OROMUCOSAL

## 2024-04-01 MED ORDER — ORAL CARE MOUTH RINSE: 15.0000 mL | Freq: Once | OROMUCOSAL | Status: AC

## 2024-04-01 MED ORDER — FENTANYL CITRATE (PF) 100 MCG/2ML IJ SOLN
INTRAMUSCULAR | Status: DC | PRN
Start: 2024-04-01 — End: 2024-04-01
  Administered 2024-04-01 (×2): 50 ug via INTRAVENOUS

## 2024-04-01 MED ORDER — ONDANSETRON HCL 4 MG/2ML IJ SOLN
INTRAMUSCULAR | Status: DC | PRN
  Administered 2024-04-01: 4 mg via INTRAVENOUS

## 2024-04-01 MED ORDER — ROCURONIUM BROMIDE 10 MG/ML (PF) SYRINGE
PREFILLED_SYRINGE | INTRAVENOUS | Status: AC
  Filled 2024-04-01: qty 10

## 2024-04-01 MED ORDER — LIDOCAINE HCL (CARDIAC) PF 100 MG/5ML IV SOSY
PREFILLED_SYRINGE | INTRAVENOUS | Status: DC | PRN
  Administered 2024-04-01: 60 mg via INTRAVENOUS

## 2024-04-01 MED ORDER — CHLORHEXIDINE GLUCONATE 0.12 % MT SOLN
OROMUCOSAL | Status: AC
  Filled 2024-04-01: qty 15

## 2024-04-01 MED ORDER — PROPOFOL 10 MG/ML IV BOLUS
INTRAVENOUS | Status: DC | PRN
  Administered 2024-04-01: 150 mg via INTRAVENOUS

## 2024-04-01 MED ORDER — LACTATED RINGERS IV SOLN: INTRAVENOUS | Status: DC

## 2024-04-01 MED ORDER — ONDANSETRON HCL 4 MG/2ML IJ SOLN: 4.0000 mg | Freq: Once | INTRAMUSCULAR | Status: DC | PRN

## 2024-04-01 MED ORDER — LACTATED RINGERS IV SOLN
INTRAVENOUS | Status: DC | PRN
Start: 2024-04-01 — End: 2024-04-01

## 2024-04-01 MED ORDER — MIDAZOLAM HCL 2 MG/2ML IJ SOLN
INTRAMUSCULAR | Status: AC
  Filled 2024-04-01: qty 2

## 2024-04-01 MED ORDER — MIDAZOLAM HCL 2 MG/2ML IJ SOLN
INTRAMUSCULAR | Status: DC | PRN
  Administered 2024-04-01: 2 mg via INTRAVENOUS

## 2024-04-01 MED ORDER — DEXAMETHASONE SODIUM PHOSPHATE 10 MG/ML IJ SOLN
INTRAMUSCULAR | Status: DC | PRN
  Administered 2024-04-01: 10 mg via INTRAVENOUS

## 2024-04-01 NOTE — Procedures (Signed)
 PROCEDURE: BRONCHOSCOPY Therapeutic Aspiration of Tracheobronchial Tree and bronchoalveolar lavage  PROCEDURE DATE: 04/01/2024  TIME:  NAME:  Kara Anderson  DOB:09/08/1976  MRN: [id number] LOC:  ARPO/None    HOSP DAY: @LENGTHOFSTAYDAYS @ CODE STATUS:   Code Status History     Date Active Date Inactive Code Status Order ID Comments User Context   10/08/2022 1134 10/09/2022 1427 Full Code [id number]  Percy Bracken, MD Inpatient   10/08/2022 0655 10/08/2022 1134 Full Code [id number]  Percy Bracken, MD Inpatient   11/04/2017 2201 11/06/2017 2030 Full Code [id number]  Vanita Gens, MD Inpatient      Advance Directive Documentation    Flowsheet Row Most Recent Value  Type of Advance Directive Healthcare Power of Attorney, Living will  Pre-existing out of facility DNR order (yellow form or pink MOST form) --  "MOST" Form in Place? --           Indications/Preliminary Diagnosis:   Consent: (Place X beside choice/s below)  The benefits, risks and possible complications of the procedure were        explained to:  _x__ patient  ___ patient's family  ___ other:___________  who verbalized understanding and gave:  ___ verbal  __x_ written  ___ verbal and written  ___ telephone  ___ other:________ consent.      Unable to obtain consent; procedure performed on emergent basis.     Other:       PRESEDATION ASSESSMENT: History and Physical has been performed. Patient meds and allergies have been reviewed. Presedation airway examination has been performed and documented. Baseline vital signs, sedation score, oxygenation status, and cardiac rhythm were reviewed. Patient was deemed to be in satisfactory condition to undergo the procedure.      PROCEDURE DETAILS: Timeout performed and correct patient, name, & ID confirmed. Following prep per Pulmonary policy, appropriate sedation was administered. The Bronchoscope was inserted in to oral cavity with bite block in place. Therapeutic  aspiration of Tracheobronchial tree was performed.  Airway exam proceeded with findings, technical procedures, and specimen collection as noted below. At the end of exam the scope was withdrawn without incident. Impression and Plan as noted below.           Airway Prep (Place X beside choice below)   1% Transtracheal Lidocaine  Anesthetization 7 cc  x Patient prepped per Bronchoscopy Lab Policy       Insertion Route (Place X beside choice below)   Nasal   Oral   Endotracheal Tube   Tracheostomy    TECHNICAL PROCEDURES: (Place X beside choice below)   Procedures  Description    None     Electrocautery     Cryotherapy     Balloon Dilatation     Bronchography     Stent Placement   x  Therapeutic Aspiration bilateral    Laser/Argon Plasma    Brachytherapy Catheter Placement    Foreign Body Removal         SPECIMENS (Sites): (Place X beside choice below)  Specimens Description   No Specimens Obtained     Washings   x Lavage RML and lingula    Biopsies    Fine Needle Aspirates    Brushings    Sputum    FINDINGS: mucus plugging and cobblestoning of mucosa     Media Information  Document Information    ESTIMATED BLOOD LOSS: none COMPLICATIONS/RESOLUTION: none      IMPRESSION:POST-PROCEDURE DX:   Mucus plugging and cobblestoning of mucosa.  At RLL there is  a small mural lesion which appears to be cartilagenous exopythic lesion (included in image above)   RECOMMENDATION/PLAN:    Awaiting cultures and cytology     Erskin Hearing, M.D.  Pulmonary & Critical Care Medicine  [facility]

## 2024-04-01 NOTE — Anesthesia Preprocedure Evaluation (Signed)
 Anesthesia Evaluation  Patient identified by MRN, date of birth, ID band Patient awake    Reviewed: Allergy & Precautions, NPO status , Patient's Chart, lab work & pertinent test results  History of Anesthesia Complications (+) PONV and history of anesthetic complications  Airway Mallampati: II  TM Distance: >3 FB Neck ROM: full    Dental  (+) Teeth Intact   Pulmonary neg pulmonary ROS, shortness of breath   Pulmonary exam normal  + decreased breath sounds      Cardiovascular Exercise Tolerance: Good negative cardio ROS Normal cardiovascular exam+ Valvular Problems/Murmurs  Rhythm:Regular Rate:Normal     Neuro/Psych  Headaches  Anxiety     negative neurological ROS  negative psych ROS   GI/Hepatic negative GI ROS, Neg liver ROS,GERD  Medicated,,  Endo/Other  negative endocrine ROSHypothyroidism    Renal/GU negative Renal ROS  negative genitourinary   Musculoskeletal   Abdominal Normal abdominal exam  (+)   Peds  Hematology negative hematology ROS (+)   Anesthesia Other Findings Past Medical History: No date: Allergy No date: Anemia No date: Anxiety No date: Bronchitis 09/29/2019: Decreased libido No date: Depression No date: Dyspnea 2007,2009: Endometriosis No date: Family history of brain cancer No date: Family history of breast cancer No date: Family history of colon cancer No date: Family history of pancreatic cancer 08/20/2022: Farmer's lung [facility])     Comment:  Pt is being treated with Trelegy inhaler and               Itraconazole. No date: GERD (gastroesophageal reflux disease) No date: Heart murmur     Comment:  pt reports she no longer has this d/t control of her               vasculitis 09/29/2019: HLD (hyperlipidemia) No date: Hypersensitivity pneumonitis (HCC)     Comment:  As of 08/20/22, pt is being treated w/ Itraconazole. Pt               follws with pulmonologist, Dr. Halina Picking @  [facility], [place]              06/18/22. Pt had abnormal chest imaging w/ ground glass               infiltrates. No date: Hypothyroidism No date: Large vessel vasculitis (HCC)     Comment:  Follwed by Dr. Dane @ [facility], [place]               07/02/22. Pt is being treated with infusions of               infliximab. 09/29/2019: Malaise and fatigue No date: Mediastinal adenopathy No date: Microvascular angina [facility])     Comment:  microvascular dysfunction and chronic angina, follows w/              Dr. Hobart at [facility], [place]               06/27/22 as of 08/20/22, Patient is currently on Ranexa ,               Imdur , NTG patch and sublingual NTG prn., 05/29/22 Echo in              Epic showed EF 65 - 70%, 08/19/22 NM PET CT Cardiac               Perfusion in Epic showed no evidence of ischemia 02/2022: Migraines No date: Mononucleosis No date: Pneumonia No  date: PONV (postoperative nausea and vomiting) 12/13/2021: Pulmonary nodules     Comment:  tiny 2 -3 mm nodules per 12/13/21 Chest CT in Epic done               for persistent cough and night sweats No date: Seasonal allergies No date: Seronegative rheumatoid arthritis (HCC)     Comment:  Followed by Dr. Assunta at [facility], [place]              07/02/22. Pt receives infusions of Avsola, last infusion               07/12/22 as of 08/21/22. No date: Seronegative rheumatoid arthritis (HCC) No date: Status post LEFT main pulmonary artery stent placement No date: Takayasu's arteritis [facility])     Comment:  Followed by Dr. Assunta at [facility], [place]              07/02/22 in Epic. No date: Viral meningitis 09/29/2019: Vitamin D deficiency disease  Past Surgical History: 12/16/2011: HIP SURGERY; Right 11/20/2017: IR THORACENTESIS ASP PLEURAL SPACE W/IMG GUIDE 7992,7990: LAPAROSCOPIC ABDOMINAL EXPLORATION     Comment:  endometriosis 11/16/2017: MEDIASTINOTOMY CHAMBERLAIN MCNEIL; Left      Comment:  Procedure: LEFT ANTERIOR MEDIASTINOTOMY CHAMBERLAIN               PROCEDURE;  Surgeon: Kerrin Elspeth BROCKS, MD;                Location: [facility] OR;  Service: Thoracic;  Laterality: Left; 12/15/2005: NOSE SURGERY     Comment:  sinus surgery - reconstructe turbinates and deviated               septum 10/2018: Pulmonary Artery Stent; Left 10/08/2022: ROBOTIC ASSISTED LAPAROSCOPIC HYSTERECTOMY AND  SALPINGECTOMY; Bilateral     Comment:  Procedure: XI ROBOTIC ASSISTED LAPAROSCOPIC TOTAL               HYSTERECTOMY AND BILATERAL SALPINGECTOMY;  Surgeon:               Lavoie, Marie-Lyne, MD;  Location: [facility] OR;  Service:               Gynecology;  Laterality: Bilateral;  BMI    Body Mass Index: 26.95 kg/m      Reproductive/Obstetrics negative OB ROS                             Anesthesia Physical Anesthesia Plan  ASA: 2  Anesthesia Plan: General   Post-op Pain Management:    Induction: Intravenous  PONV Risk Score and Plan: Ondansetron , Dexamethasone , Midazolam  and Treatment may vary due to age or medical condition  Airway Management Planned: Oral ETT  Additional Equipment:   Intra-op Plan:   Post-operative Plan: Extubation in OR  Informed Consent: I have reviewed the patients History and Physical, chart, labs and discussed the procedure including the risks, benefits and alternatives for the proposed anesthesia with the patient or authorized representative who has indicated his/her understanding and acceptance.     Dental Advisory Given  Plan Discussed with: CRNA  Anesthesia Plan Comments:        Anesthesia Quick Evaluation

## 2024-04-01 NOTE — Transfer of Care (Signed)
 Immediate Anesthesia Transfer of Care Note  Patient: Kara Anderson  Procedure(s) Performed: BRONCHOSCOPY, FLEXIBLE IRRIGATION, BRONCHUS  Patient Location: PACU  Anesthesia Type:General  Level of Consciousness: drowsy  Airway & Oxygen Therapy: Patient Spontanous Breathing and Patient connected to face mask oxygen  Post-op Assessment: Report given to RN and Post -op Vital signs reviewed and stable  Post vital signs: Reviewed and stable  Last Vitals:  Vitals Value Taken Time  BP 122/77 04/01/24 1245  Temp 36.1 C 04/01/24 1240  Pulse 80 04/01/24 1248  Resp 13 04/01/24 1248  SpO2 100 % 04/01/24 1248  Vitals shown include unfiled device data.  Last Pain:  Vitals:   04/01/24 1244  TempSrc:   PainSc: 0-No pain         Complications: No notable events documented.

## 2024-04-01 NOTE — H&P (Signed)
 PULMONOLOGY         Date: 04/01/2024,   MRN# [id number] GEOVANNA SIMKO 04-18-1976     AdmissionWeight: 71.2 kg                 CurrentWeight: 71.2 kg    CHIEF COMPLAINT:   Recurrent pneumonia    HISTORY OF PRESENT ILLNESS   This is a pleasant 48 yo F with history of vasculitis and s/p stenting of left pulmonary artery due to stenosis, takayasu arteritis, recurrent pneumonia previously treated for allergic bronchopulmonary mycosis and farmers lung.  She also has additional comorbid history as noted below.  She is here today due to persistent respiratory symptoms with dyspnea.  We plan to perform bronchoscopy with airway inspection, BAL for microbiology to rule out ongoing infection from any opportunistic etiology, fungal or atypical infection.  We may also perform biopsy if we find any exophytic suscpicious lesions for carcinoma.  Reviewed risks/complications and benefits with patient, risks include infection, pneumothorax/pneumomediastinum which may require chest tube placement as well as overnight/prolonged hospitalization and possible mechanical ventilation. Other risks include bleeding and very rarely death.  Patient understands risks and wishes to proceed.  Additional questions were answered, and patient is aware that post procedure patient will be going home with family and may experience cough with possible clots on expectoration as well as phlegm which may last few days as well as hoarseness of voice post intubation and mechanical ventilation.    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   GERD (gastroesophageal reflux disease)    Heart murmur    pt reports she no longer has this d/t control of her vasculitis   HLD (hyperlipidemia) 09/29/2019   Hypersensitivity pneumonitis (HCC)    As of 08/20/22, pt is being treated w/ Itraconazole. Pt follws with pulmonologist, Dr. Erskin Hearing @ [facility], LOV 06/18/22. Pt had abnormal chest imaging w/ ground glass infiltrates.   Hypothyroidism    Large vessel vasculitis (HCC)    Follwed by Dr. Arma Berkshire @ [facility], LOV 07/02/22. Pt is being treated with infusions of infliximab.   Malaise and fatigue 09/29/2019   Mediastinal adenopathy    Microvascular angina (HCC)    microvascular dysfunction and chronic angina, follows w/ Dr. Ardell Beauvais at [facility], LOV 06/27/22 as of 08/20/22, Patient is currently on Ranexa , Imdur , NTG patch and sublingual NTG prn., 05/29/22 Echo in Epic showed EF 65 - 70%, 08/19/22 NM PET CT Cardiac Perfusion in Epic showed no evidence of ischemia   Migraines 02/2022   Mononucleosis    Pneumonia    PONV (postoperative nausea and vomiting)    Pulmonary nodules 12/13/2021   tiny 2 -3 mm nodules per 12/13/21 Chest CT in Epic done for persistent cough and night sweats   Seasonal allergies    Seronegative rheumatoid arthritis (HCC)    Followed by Dr. Fritzi Jewels at [facility], LOV 07/02/22. Pt receives infusions of Avsola, last infusion 07/12/22 as of  08/21/22.   Seronegative rheumatoid arthritis (HCC)    Status post LEFT main pulmonary artery stent placement    Takayasu's arteritis (HCC)    Followed by Dr. Fritzi Jewels at [facility], LOV 07/02/22 in Epic.   Viral meningitis    Vitamin D deficiency disease 09/29/2019     SURGICAL HISTORY   Past Surgical History:  Procedure Laterality Date   HIP SURGERY Right 12/16/2011   IR THORACENTESIS ASP PLEURAL SPACE W/IMG GUIDE  11/20/2017   LAPAROSCOPIC ABDOMINAL EXPLORATION  2007,2009   endometriosis   MEDIASTINOTOMY  CHAMBERLAIN MCNEIL    Left 11/16/2017   Procedure: LEFT ANTERIOR MEDIASTINOTOMY CHAMBERLAIN PROCEDURE;  Surgeon: Zelphia Higashi, MD;  Location: [facility] OR;  Service: Thoracic;  Laterality: Left;   NOSE SURGERY  12/15/2005   sinus surgery - reconstructe turbinates and deviated septum   Pulmonary Artery Stent Left 10/2018   ROBOTIC ASSISTED LAPAROSCOPIC HYSTERECTOMY AND SALPINGECTOMY Bilateral 10/08/2022   Procedure: XI ROBOTIC ASSISTED LAPAROSCOPIC TOTAL HYSTERECTOMY AND BILATERAL SALPINGECTOMY;  Surgeon: Lavoie, Marie-Lyne, MD;  Location: [facility] OR;  Service: Gynecology;  Laterality: Bilateral;     FAMILY HISTORY   Family History  Problem Relation Age of Onset   Heart disease Mother    Stroke Mother    Diabetes Mother    Brain cancer Mother 17       glioblastoma   Heart disease Father    Alcohol abuse Father    Alcoholism Father    Other Sister        FAP   Colonic polyp Sister 59       adenomatous polyp   Breast cancer Maternal Aunt    Lung cancer Paternal Uncle 2   Other Paternal Uncle 27       MVA   Pancreatic disease Maternal Grandmother    Stomach cancer Maternal Grandmother    Colon cancer Maternal Grandmother    Heart attack Paternal Grandfather    Leukemia Cousin 12   Breast cancer Cousin 12       neg GT   Esophageal cancer Neg Hx    Liver disease Neg Hx    Rectal cancer Neg Hx      SOCIAL HISTORY   Social History   Tobacco Use   Smoking status: Never    Passive exposure: Never   Smokeless tobacco: Never  Vaping Use   Vaping status: Never Used  Substance Use Topics   Alcohol use: No    Alcohol/week: 0.0 standard drinks of alcohol   Drug use: No     MEDICATIONS    Home Medication:    Current Medication:  Current Facility-Administered Medications:    lactated ringers  infusion, , Intravenous, Continuous, Johnson, Karyn Pai, MD    ALLERGIES   Codeine and Crestor  [rosuvastatin ]     REVIEW OF SYSTEMS    Review of Systems:  Gen:   Denies  fever, sweats, chills weigh loss  HEENT: Denies blurred vision, double vision, ear pain, eye pain, hearing loss, nose bleeds, sore throat Cardiac:  No dizziness, chest pain or heaviness, chest tightness,edema Resp:   reports dyspnea chronically  Gi: Denies swallowing difficulty, stomach pain, nausea or vomiting, diarrhea, constipation, bowel incontinence Gu:  Denies bladder incontinence, burning urine Ext:   Denies Joint pain, stiffness or swelling Skin: Denies  skin rash, easy bruising or bleeding or hives Endoc:  Denies polyuria, polydipsia , polyphagia or weight change Psych:   Denies depression, insomnia or hallucinations   Other:  All other systems negative  VS: BP 107/64   Pulse 78   Temp (!) 97 F (36.1 C) (Temporal)   Resp 15   Ht 5\' 4"  (1.626 m)   Wt 71.2 kg   LMP 09/21/2022 (Exact Date)   SpO2 100%   BMI 26.95 kg/m      PHYSICAL EXAM    GENERAL:NAD, no fevers, chills, no weakness no fatigue HEAD: Normocephalic, atraumatic.  EYES: Pupils equal, round, reactive to light. Extraocular muscles intact. No scleral icterus.  MOUTH: Moist mucosal membrane. Dentition intact. No abscess noted.  EAR, NOSE, THROAT: Clear without exudates. No external lesions.  NECK: Supple. No thyromegaly. No nodules. No JVD.  PULMONARY: decreased breath sounds with mild rhonchi worse at bases bilaterally.  CARDIOVASCULAR: S1 and S2. Regular rate and rhythm. No murmurs, rubs, or gallops. No edema. Pedal pulses 2+ bilaterally.  GASTROINTESTINAL: Soft, nontender, nondistended. No masses. Positive bowel sounds. No hepatosplenomegaly.  MUSCULOSKELETAL: No swelling, clubbing, or edema. Range of motion full in all extremities.  NEUROLOGIC: Cranial nerves II through XII are intact. No gross focal neurological deficits. Sensation intact. Reflexes intact.  SKIN: No ulceration, lesions, rashes, or cyanosis. Skin warm and dry. Turgor intact.  PSYCHIATRIC: Mood, affect within normal limits. The  patient is awake, alert and oriented x 3. Insight, judgment intact.       IMAGING   ve & Impression  CLINICAL DATA:  Upper respiratory infection.   EXAM: CT CHEST WITHOUT CONTRAST   TECHNIQUE: Multidetector CT imaging of the chest was performed following the standard protocol without IV contrast.   RADIATION DOSE REDUCTION: This exam was performed according to the departmental dose-optimization program which includes automated exposure control, adjustment of the mA and/or kV according to patient size and/or use of iterative reconstruction technique.   COMPARISON:  12/13/2021   FINDINGS: Cardiovascular: The heart is normal in size. No pericardial effusion. The aorta is normal in caliber. Minimal calcification. No definite coronary artery calcifications. Stable stent noted in the left pulmonary artery. The pulmonary arterial trunk is mildly dilated.   Mediastinum/Nodes: Small scattered mediastinal and hilar lymph nodes are stable. No mass or adenopathy. The esophagus is grossly normal.   Lungs/Pleura: No acute pulmonary findings. No infiltrates, edema effusions. No worrisome pulmonary lesions or pulmonary nodules. No interstitial lung disease or bronchiectasis. The central tracheobronchial tree is unremarkable. No pleural process.   Upper Abdomen: No significant upper abdominal findings.   Musculoskeletal: No breast masses, supraclavicular or axillary adenopathy. The bony thorax is intact.   IMPRESSION: 1. No acute pulmonary findings or worrisome pulmonary lesions. 2. Stable stent in the left pulmonary artery. 3. Mildly dilated pulmonary arterial trunk.     Electronically Signed   By: Marrian Siva M.D.   On: 01/22/2023 10:28    ASSESSMENT/PLAN   Recurrent pneumonia with dyspnea    - previously treated for allergic bronchopulmonary mycosis and farmers lung    - history of relative immunosuppresion     - plan to perform bronchoscopy to rule out any ongoing  infection    - bronchoscopy with therapeutic aspiration of tracheobronchial tree for mucus plugging and BAL    -Reviewed risks/complications and benefits with patient, risks include infection, pneumothorax/pneumomediastinum which may require chest tube placement as well as overnight/prolonged hospitalization and possible mechanical ventilation. Other risks include bleeding and very rarely death.  Patient understands risks and wishes to proceed.  Additional questions were answered, and patient is aware that post procedure patient will be going home with family and may experience cough with  possible clots on expectoration as well as phlegm which may last few days as well as hoarseness of voice post intubation and mechanical ventilation.             Thank you for allowing me to participate in the care of this patient.   Patient/Family are satisfied with care plan and all questions have been answered.    Provider disclosure: Patient with at least one acute or chronic illness or injury that poses a threat to life or bodily function and is being managed actively during this encounter.  All of the below services have been performed independently by signing provider:  review of prior documentation from internal and or external health records.  Review of previous and current lab results.  Interview and comprehensive assessment during patient visit today. Review of current and previous chest radiographs/CT scans. Discussion of management and test interpretation with health care team and patient/family.   This document was prepared using Dragon voice recognition software and may include unintentional dictation errors.     Gwendy Boeder, M.D.  Division of Pulmonary & Critical Care Medicine

## 2024-04-01 NOTE — Anesthesia Postprocedure Evaluation (Signed)
 Anesthesia Post Note  Patient: Kara Anderson  Procedure(s) Performed: BRONCHOSCOPY, FLEXIBLE IRRIGATION, BRONCHUS  Patient location during evaluation: PACU Anesthesia Type: General Level of consciousness: awake Pain management: satisfactory to patient Vital Signs Assessment: post-procedure vital signs reviewed and stable Respiratory status: spontaneous breathing Cardiovascular status: blood pressure returned to baseline Anesthetic complications: no   No notable events documented.   Last Vitals:  Vitals:   04/01/24 1257 04/01/24 1300  BP:  112/79  Pulse:  81  Resp:  12  Temp: 36.9 C   SpO2:  98%    Last Pain:  Vitals:   04/01/24 1300  TempSrc:   PainSc: 0-No pain                 VAN STAVEREN,Casimira Sutphin

## 2024-04-01 NOTE — Anesthesia Procedure Notes (Signed)
 Procedure Name: Intubation Date/Time: 04/01/2024 12:16 PM  Performed by: Niki Manus SAUNDERS, CRNAPre-anesthesia Checklist: Patient identified, Emergency Drugs available, Suction available, Patient being monitored and Timeout performed Patient Re-evaluated:Patient Re-evaluated prior to induction Oxygen Delivery Method: Circle system utilized Preoxygenation: Pre-oxygenation with 100% oxygen Induction Type: IV induction Ventilation: Mask ventilation without difficulty Laryngoscope Size: McGrath and 3 Grade View: Grade I Tube type: Oral Tube size: 8.5 mm Number of attempts: 1 Airway Equipment and Method: Stylet and Video-laryngoscopy Placement Confirmation: ETT inserted through vocal cords under direct vision, positive ETCO2 and breath sounds checked- equal and bilateral Secured at: 22 cm Tube secured with: Tape Dental Injury: Teeth and Oropharynx as per pre-operative assessment  Comments: Difficulty passing 8.5 ETT

## 2024-04-02 ENCOUNTER — Encounter: Payer: Self-pay | Admitting: Pulmonary Disease

## 2024-04-04 LAB — CULTURE, BAL-QUANTITATIVE W GRAM STAIN: Culture: NO GROWTH — AB

## 2024-04-04 LAB — CYTOLOGY - NON PAP

## 2024-04-06 LAB — ACID FAST SMEAR (AFB, MYCOBACTERIA): Acid Fast Smear: NEGATIVE

## 2024-04-19 ENCOUNTER — Encounter (HOSPITAL_BASED_OUTPATIENT_CLINIC_OR_DEPARTMENT_OTHER): Payer: Self-pay

## 2024-04-19 MED ORDER — RANOLAZINE ER 500 MG PO TB12: 1000.0000 mg | ORAL_TABLET | Freq: Two times a day (BID) | ORAL | 3 refills | Status: AC

## 2024-04-22 LAB — CULTURE, FUNGUS WITHOUT SMEAR

## 2024-05-05 ENCOUNTER — Other Ambulatory Visit: Payer: Self-pay | Admitting: Cardiology

## 2024-05-05 DIAGNOSIS — E7849 Other hyperlipidemia: Secondary | ICD-10-CM

## 2024-05-19 LAB — ACID FAST CULTURE WITH REFLEXED SENSITIVITIES (MYCOBACTERIA): Acid Fast Culture: NEGATIVE

## 2024-06-10 ENCOUNTER — Ambulatory Visit (HOSPITAL_BASED_OUTPATIENT_CLINIC_OR_DEPARTMENT_OTHER): Payer: Medicare HMO | Admitting: Family

## 2024-06-10 ENCOUNTER — Encounter (HOSPITAL_BASED_OUTPATIENT_CLINIC_OR_DEPARTMENT_OTHER): Payer: Self-pay | Admitting: Family

## 2024-06-10 VITALS — BP 108/70 | HR 73 | Ht 64.0 in | Wt 152.0 lb

## 2024-06-10 DIAGNOSIS — I2089 Other forms of angina pectoris: Secondary | ICD-10-CM | POA: Diagnosis not present

## 2024-06-10 DIAGNOSIS — E7849 Other hyperlipidemia: Secondary | ICD-10-CM | POA: Diagnosis not present

## 2024-06-10 DIAGNOSIS — I471 Supraventricular tachycardia, unspecified: Secondary | ICD-10-CM

## 2024-06-10 DIAGNOSIS — I952 Hypotension due to drugs: Secondary | ICD-10-CM

## 2024-06-10 DIAGNOSIS — I959 Hypotension, unspecified: Secondary | ICD-10-CM | POA: Diagnosis not present

## 2024-06-10 MED ORDER — MIDODRINE HCL 5 MG PO TABS: 5.0000 mg | ORAL_TABLET | Freq: Three times a day (TID) | ORAL | 1 refills | Status: DC

## 2024-06-10 MED ORDER — ISOSORBIDE MONONITRATE ER 30 MG PO TB24: 90.0000 mg | ORAL_TABLET | Freq: Two times a day (BID) | ORAL | 11 refills | Status: DC

## 2024-06-10 NOTE — Progress Notes (Signed)
 Cardiology Office Note:  .   Date:  06/10/2024  ID:  Kara Anderson, DOB 1975-12-24, MRN [id number] PCP: Epifanio Alm SQUIBB, MD  [facility] Providers Cardiologist:  Shelda Bruckner, MD    History of Present Illness: .   Kara Anderson is a 48 y.o. female with a hx of takayasu arteriolitis and rheumatoid arthritis on infliximab followed by rheumatology, pulmonary stenosis s/p stenting 08/2018, mediastinal mass (11/2017 excise c/b elevated L hemidiaphragm), hyperlipidemia, microvascular dysfunction diagnosed at [facility] with chronic angina, hypersensitivity pneumonitis with Farmer's Lung, familial hyperlipidemia.    Previously followed by [facility] cardiology. Chest CT 07/2019 with patent pulmonary artery stent and no significant CAD. Seen at [facility] clinic and diagnosed with microvascular dysfunction due to persistent substernal pain.    Has followed closely with Dr. Hobart due to microvascular angina. Has since transitioned care to Dr. Bruckner. Did not tolerate Amlodipine  or Diltiazem  but had hypotension. Metoprolol  previously discontinued due to fatigue.    Follows with Dr. Aleskerov due to hypersensitivity pneumonitis with Farmer's lung (abnormal CT with ground glass infiltrates).   Seen 06/2022 persistent discomfort. Subsequent cardiac PET with normal LVEF, normal MBF with appropriate augmentation with stress. 10/2022 Nexlizet  added to Repatha  for familial hyperlipidemia.  Last seen 01/23/24. She was recommended to move second dose of midodrine  to lunch time and add third dose with dinner PRN for hypotension symptoms.   Had bronchoscopy with Dr. Aleskerov 04/01/24 with thick, whitish mucus likely allergic rather than infectious. Negative cultures. Recommended by pulmonology to continue PRN albuterol , focus on anti inflammatory lifestyle changes, continue immunosuppressive therapy.  At visit 04/27/24 with pulmonology, she was completing abx and prednisone  taper and changed from  aluterol to duoneb.   Presents today for follow up independently. Notes tried to resume weight training and could get through the exercise but could not recover. Lightheaded with position changes such as turning suddenly or bending over. She has stopped weight training. She has been able to maintain walking 5 times per week though more recently trying to avoid the heat. SHe is presently taking her midodrine  in the 5mg  in the morning and at 3-4pm for the last couple months as she felt better on the 5mg  tablet rather than 2.5mg  daily. Has not needed PRN nitroglycerin  recently but does keep on hand.  ROS: Please see the history of present illness.    All other systems reviewed and are negative.   Studies Reviewed: SABRA   EKG Interpretation Date/Time:  Friday June 10 2024 13:29:07 EDT Ventricular Rate:  76 PR Interval:  148 QRS Duration:  70 QT Interval:  384 QTC Calculation: 432 R Axis:   76  Text Interpretation: Normal sinus rhythm  No acute ST/T wave changes Confirmed by Kara Anderson ([id number]) on 06/10/2024 1:35:46 PM       Risk Assessment/Calculations:             Physical Exam:   VS:  BP 108/70 (BP Location: Right Arm, Patient Position: Sitting, Cuff Size: Normal)   Pulse 73   Ht 5' 4 (1.626 m)   Wt 152 lb (68.9 kg)   LMP 09/21/2022 (Exact Date)   SpO2 98%   BMI 26.09 kg/m    Wt Readings from Last 3 Encounters:  06/10/24 152 lb (68.9 kg)  04/01/24 157 lb (71.2 kg)  02/02/24 159 lb 11.2 oz (72.4 kg)    GEN: Well nourished, well developed in no acute distress NECK: No JVD; No carotid bruits CARDIAC: RRR, no murmurs, rubs,  gallops RESPIRATORY:  Clear to auscultation without rales, wheezing or rhonchi  ABDOMEN: Soft, non-tender, non-distended EXTREMITIES:  No edema; No deformity   ASSESSMENT AND PLAN: .    Microvascular angina / Hypotension - Diagnosed at [facility] clinic. Has previously trialed multiple regimens. Did not tolerate Metoprolol  (fatigue), Diltiazem  (hypotension),  Amlodipine  (Hypotension). Previous anti-anginal regimen as high as Imdur  90mg  BID, Ranexa  1,000mg  BID, nitroglycerin  PO PRN. Previously took breaks in her nitroglycerin  path to prevent tolerance. Presently tolerating Imdur  90mg  twice daily, Ranexa  1000 mg twice daily. Adjust midodrine  from 5mg  BID to 5mg  TID. If hypotension persists, may need to consider 10mg  dose.   Pulmonary artery stenosis S/p pulmonary artery stent [id number] - patent by cardiac PET 08/2022. Follows with pulmonology.   Familial hyperlipidemia - 10/2022 Lp(a) 216.3. Statin intolerance to Crestor . On Repatha , Nexlizet . 07/2023 LDL  57.   Takayatsu Arteritis / RA - Follows with rheumatology. Managed on immunosuppressants.  Pneumonitis and farmer's Lung - Follows with Dr. Parris of pulmonology       Dispo: follow up in 6 mos  Signed, Reche GORMAN Finder, NP

## 2024-06-10 NOTE — Patient Instructions (Signed)
 Medication Instructions:  Your physician has recommended you make the following change in your medication:  CHANGE Midodrine  5 mg three times daily  Follow-Up: Please follow up in January 2026 with Dr. Lonni, Rosaline Bane, NP or Reche Finder, NP

## 2024-06-24 ENCOUNTER — Encounter (HOSPITAL_BASED_OUTPATIENT_CLINIC_OR_DEPARTMENT_OTHER): Payer: Self-pay

## 2024-07-18 ENCOUNTER — Other Ambulatory Visit: Payer: Self-pay | Admitting: Cardiology

## 2024-07-18 DIAGNOSIS — E7849 Other hyperlipidemia: Secondary | ICD-10-CM

## 2024-07-20 NOTE — Telephone Encounter (Signed)
 SABRA

## 2024-08-03 ENCOUNTER — Encounter (HOSPITAL_BASED_OUTPATIENT_CLINIC_OR_DEPARTMENT_OTHER): Payer: Self-pay

## 2024-08-03 DIAGNOSIS — I2089 Other forms of angina pectoris: Secondary | ICD-10-CM

## 2024-08-04 MED ORDER — ISOSORBIDE MONONITRATE ER 30 MG PO TB24: ORAL_TABLET | ORAL | Status: DC

## 2024-09-23 ENCOUNTER — Other Ambulatory Visit: Payer: Self-pay | Admitting: Infectious Diseases

## 2024-09-23 DIAGNOSIS — Z1231 Encounter for screening mammogram for malignant neoplasm of breast: Secondary | ICD-10-CM

## 2024-10-04 ENCOUNTER — Other Ambulatory Visit: Payer: Self-pay | Admitting: Medical Genetics

## 2024-10-05 ENCOUNTER — Other Ambulatory Visit (HOSPITAL_COMMUNITY)
Admission: RE | Admit: 2024-10-05 | Discharge: 2024-10-05 | Disposition: A | Discharge status: Home / Self Care | Payer: Self-pay | Source: Ambulatory Visit | Attending: Medical Genetics | Admitting: Medical Genetics

## 2024-10-06 ENCOUNTER — Other Ambulatory Visit (HOSPITAL_COMMUNITY): Payer: Self-pay

## 2024-10-10 ENCOUNTER — Ambulatory Visit
Admission: RE | Admit: 2024-10-10 | Discharge: 2024-10-10 | Disposition: A | Discharge status: Home / Self Care | Source: Ambulatory Visit | Attending: Infectious Diseases | Admitting: Infectious Diseases

## 2024-10-10 DIAGNOSIS — Z1231 Encounter for screening mammogram for malignant neoplasm of breast: Secondary | ICD-10-CM

## 2024-10-13 ENCOUNTER — Other Ambulatory Visit: Payer: Self-pay

## 2024-10-13 ENCOUNTER — Encounter (HOSPITAL_COMMUNITY): Payer: Self-pay | Admitting: Emergency Medicine

## 2024-10-13 ENCOUNTER — Observation Stay (HOSPITAL_COMMUNITY)
Admission: EM | Admit: 2024-10-13 | Discharge: 2024-10-14 | Disposition: A | Discharge status: Home / Self Care | Attending: Cardiology | Admitting: Cardiology

## 2024-10-13 ENCOUNTER — Emergency Department (HOSPITAL_COMMUNITY)

## 2024-10-13 ENCOUNTER — Telehealth: Payer: Self-pay | Admitting: Cardiology

## 2024-10-13 DIAGNOSIS — I959 Hypotension, unspecified: Secondary | ICD-10-CM | POA: Diagnosis not present

## 2024-10-13 DIAGNOSIS — L959 Vasculitis limited to the skin, unspecified: Secondary | ICD-10-CM | POA: Insufficient documentation

## 2024-10-13 DIAGNOSIS — Q221 Congenital pulmonary valve stenosis: Secondary | ICD-10-CM | POA: Diagnosis not present

## 2024-10-13 DIAGNOSIS — M314 Aortic arch syndrome [Takayasu]: Secondary | ICD-10-CM | POA: Diagnosis not present

## 2024-10-13 DIAGNOSIS — I2081 Angina pectoris with coronary microvascular dysfunction: Principal | ICD-10-CM | POA: Insufficient documentation

## 2024-10-13 DIAGNOSIS — E039 Hypothyroidism, unspecified: Secondary | ICD-10-CM | POA: Insufficient documentation

## 2024-10-13 DIAGNOSIS — Z1231 Encounter for screening mammogram for malignant neoplasm of breast: Secondary | ICD-10-CM | POA: Insufficient documentation

## 2024-10-13 DIAGNOSIS — Q256 Stenosis of pulmonary artery: Secondary | ICD-10-CM

## 2024-10-13 DIAGNOSIS — E785 Hyperlipidemia, unspecified: Secondary | ICD-10-CM | POA: Diagnosis not present

## 2024-10-13 DIAGNOSIS — I200 Unstable angina: Principal | ICD-10-CM

## 2024-10-13 DIAGNOSIS — R079 Chest pain, unspecified: Secondary | ICD-10-CM | POA: Diagnosis present

## 2024-10-13 DIAGNOSIS — M069 Rheumatoid arthritis, unspecified: Secondary | ICD-10-CM | POA: Diagnosis not present

## 2024-10-13 DIAGNOSIS — I776 Arteritis, unspecified: Secondary | ICD-10-CM

## 2024-10-13 LAB — PRO BRAIN NATRIURETIC PEPTIDE: Pro Brain Natriuretic Peptide: <50 pg/mL (ref <300.0)

## 2024-10-13 LAB — TROPONIN T, HIGH SENSITIVITY
Troponin T High Sensitivity: <15 ng/L (ref 0–19)
Troponin T High Sensitivity: <15 ng/L (ref 0–19)

## 2024-10-13 LAB — CBC
HCT: 36.4 % (ref 36.0–46.0)
Hemoglobin: 12.4 g/dL (ref 12.0–15.0)
MCH: 32.5 pg (ref 26.0–34.0)
MCHC: 34.1 g/dL (ref 30.0–36.0)
MCV: 95.3 fL (ref 80.0–100.0)
Platelets: 313 K/uL (ref 150–400)
RBC: 3.82 MIL/uL — ABNORMAL LOW (ref 3.87–5.11)
RDW: 12.1 % (ref 11.5–15.5)
WBC: 6.7 K/uL (ref 4.0–10.5)
nRBC: 0 % (ref 0.0–0.2)

## 2024-10-13 LAB — BASIC METABOLIC PANEL WITH GFR
Anion gap: 10 (ref 5–15)
BUN: 19 mg/dL (ref 6–20)
CO2: 27 mmol/L (ref 22–32)
Calcium: 9.7 mg/dL (ref 8.9–10.3)
Chloride: 100 mmol/L (ref 98–111)
Creatinine, Ser: 0.66 mg/dL (ref 0.44–1.00)
GFR, Estimated: >60 mL/min (ref >60)
Glucose, Bld: 108 mg/dL — ABNORMAL HIGH (ref 70–99)
Potassium: 4.1 mmol/L (ref 3.5–5.1)
Sodium: 137 mmol/L (ref 135–145)

## 2024-10-13 MED ORDER — ISOSORBIDE MONONITRATE ER 60 MG PO TB24: 120.0000 mg | ORAL_TABLET | Freq: Every day | ORAL | Status: DC

## 2024-10-13 MED ORDER — DILTIAZEM HCL 30 MG PO TABS
30.0000 mg | ORAL_TABLET | Freq: Every day | ORAL | Status: DC
  Administered 2024-10-13: 30 mg via ORAL
  Filled 2024-10-13: qty 1

## 2024-10-13 MED ORDER — PERFLUOROHEXYLOCTANE 1.338 GM/ML OP SOLN: 1.0000 [drp] | Freq: Four times a day (QID) | OPHTHALMIC | Status: DC

## 2024-10-13 MED ORDER — ISOSORBIDE MONONITRATE ER 30 MG PO TB24
30.0000 mg | ORAL_TABLET | Freq: Every day | ORAL | Status: DC
  Administered 2024-10-13: 30 mg via ORAL
  Filled 2024-10-13: qty 1

## 2024-10-13 MED ORDER — ACETAMINOPHEN 650 MG RE SUPP: 650.0000 mg | Freq: Four times a day (QID) | RECTAL | Status: DC | PRN

## 2024-10-13 MED ORDER — ACETAMINOPHEN 325 MG PO TABS: 650.0000 mg | ORAL_TABLET | Freq: Four times a day (QID) | ORAL | Status: DC | PRN

## 2024-10-13 MED ORDER — IOHEXOL 350 MG/ML SOLN
80.0000 mL | Freq: Once | INTRAVENOUS | Status: AC | PRN
  Administered 2024-10-13: 75 mL via INTRAVENOUS

## 2024-10-13 MED ORDER — ISOSORBIDE MONONITRATE ER 30 MG PO TB24
90.0000 mg | ORAL_TABLET | Freq: Every day | ORAL | Status: DC
Start: 2024-10-13 — End: 2024-10-13

## 2024-10-13 MED ORDER — POLYETHYLENE GLYCOL 3350 17 G PO PACK
17.0000 g | PACK | Freq: Every day | ORAL | Status: DC | PRN
Start: 2024-10-13 — End: 2024-10-15

## 2024-10-13 MED ORDER — ASPIRIN 81 MG PO CHEW
324.0000 mg | CHEWABLE_TABLET | Freq: Once | ORAL | Status: AC
  Administered 2024-10-13: 324 mg via ORAL
  Filled 2024-10-13: qty 4

## 2024-10-13 MED ORDER — MIDODRINE HCL 5 MG PO TABS: 7.5000 mg | ORAL_TABLET | Freq: Three times a day (TID) | ORAL | Status: DC

## 2024-10-13 MED ORDER — MIDODRINE HCL 5 MG PO TABS
7.5000 mg | ORAL_TABLET | Freq: Three times a day (TID) | ORAL | Status: DC
  Administered 2024-10-13 – 2024-10-14 (×3): 7.5 mg via ORAL
  Filled 2024-10-13 (×4): qty 2

## 2024-10-13 MED ORDER — DILTIAZEM HCL 30 MG PO TABS: 60.0000 mg | ORAL_TABLET | Freq: Every day | ORAL | Status: DC

## 2024-10-13 MED ORDER — ONDANSETRON HCL 4 MG PO TABS: 4.0000 mg | ORAL_TABLET | Freq: Four times a day (QID) | ORAL | Status: DC | PRN

## 2024-10-13 MED ORDER — AMITRIPTYLINE HCL 10 MG PO TABS
40.0000 mg | ORAL_TABLET | Freq: Every day | ORAL | Status: DC
  Administered 2024-10-14: 40 mg via ORAL
  Filled 2024-10-13 (×3): qty 4

## 2024-10-13 MED ORDER — RANOLAZINE ER 500 MG PO TB12
1000.0000 mg | ORAL_TABLET | Freq: Two times a day (BID) | ORAL | Status: DC
  Administered 2024-10-13 – 2024-10-14 (×2): 1000 mg via ORAL
  Filled 2024-10-13 (×2): qty 2

## 2024-10-13 MED ORDER — ENOXAPARIN SODIUM 40 MG/0.4ML IJ SOSY
40.0000 mg | PREFILLED_SYRINGE | INTRAMUSCULAR | Status: DC
  Administered 2024-10-13: 40 mg via SUBCUTANEOUS
  Filled 2024-10-13: qty 0.4

## 2024-10-13 MED ORDER — SODIUM CHLORIDE 0.9 % IV BOLUS
500.0000 mL | Freq: Once | INTRAVENOUS | Status: AC
  Administered 2024-10-13: 500 mL via INTRAVENOUS

## 2024-10-13 MED ORDER — LIOTHYRONINE SODIUM 5 MCG PO TABS
5.0000 ug | ORAL_TABLET | Freq: Every day | ORAL | Status: DC
  Administered 2024-10-14: 5 ug via ORAL
  Filled 2024-10-13 (×2): qty 1

## 2024-10-13 MED ORDER — GENTAMICIN SULFATE 0.1 % EX OINT: 1.0000 | TOPICAL_OINTMENT | Freq: Three times a day (TID) | CUTANEOUS | Status: DC

## 2024-10-13 MED ORDER — NITROGLYCERIN 0.4 MG SL SUBL
0.4000 mg | SUBLINGUAL_TABLET | Freq: Once | SUBLINGUAL | Status: AC
  Administered 2024-10-13: 0.4 mg via SUBLINGUAL
  Filled 2024-10-13: qty 1

## 2024-10-13 MED ORDER — ONDANSETRON HCL 4 MG/2ML IJ SOLN: 4.0000 mg | Freq: Four times a day (QID) | INTRAMUSCULAR | Status: DC | PRN

## 2024-10-13 NOTE — ED Notes (Signed)
 [facility] paged for transport at this time

## 2024-10-13 NOTE — ED Triage Notes (Signed)
 Pt states that she has been having multiple episodes of back pain, chest pain/pressure and feeling dizzy with cold sweats since 0100 this morning. Pt states it gets better as soon as she takes her nitroglycerin .

## 2024-10-13 NOTE — Consult Note (Addendum)
 CARDIOLOGY CONSULT NOTE    Patient ID: Kara Anderson; [id number]; [birth year]   Admit date: 10/13/2024 Date of Consult: 10/13/2024  Primary Care Provider: Epifanio Alm SQUIBB, MD Primary Cardiologist:  Primary Electrophysiologist:    History of Present Illness:   Kara Anderson is a 47 y/o F known to have Takayasu arteritis, rheumatoid arthritis, pulmonary artery stenosis s/p stent in 2019, mediastinal mass s/p excision in 2018 c/w elevated left hemidiaphragm, hypersensitivity pneumonitis with farmers lung, coronary vasospasm presented to ER with chest pain.  Patient follows up with [facility] cardiology for the management of coronary microvascular dysfunction from epicardial coronary constriction.  She tried multiple medications in the past, she had hypotension from diltiazem  and amlodipine , fatigue from metoprolol .  Currently on Ranexa  1000 mg twice daily and Imdur  120 mg in the a.m. and 90 mg in the p.m. (she takes 90 mg at 3 PM).  For breakthrough chest pain episodes, she takes SL NTG as needed.  She also takes midodrine  5 mg 3 times daily to maintain blood pressures.  She presented with multiple episodes of intermittent chest pain radiating to her jaw, back, shoulder blades with diffuse diaphoresis last night.  Completely resolved by SL NTG.  These episodes usually occur during the day sometimes at rest and exertion but never at night.  She had LHC done at [facility] in 2020, it showed normal coronaries, normal right heart hemodynamics at rest and with exercise, abnormal endothelial function (moderate diffuse epicardial constriction with acetylcholine), coronary flow reserve is normal (3.1) but lower than expected for the patient's relative Carby age.  She had symptoms of typical left shoulder, retrosternal and back pain at the peak dose of the drug as well: That resolved slowly after nitroglycerin  over 10 minutes confirming the diagnosis.  She also has mild shortness of breath for which  she follows up with pulmonology.   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   GERD (gastroesophageal reflux disease)    Heart murmur    pt reports she no longer has this d/t control of her vasculitis   HLD (hyperlipidemia) 09/29/2019   Hypersensitivity pneumonitis (HCC)    As of 08/20/22, pt is being treated w/ Itraconazole. Pt follws with pulmonologist, Dr. Halina Picking @ [facility], LOV 06/18/22. Pt had abnormal chest imaging w/ ground glass infiltrates.   Hypothyroidism    Large vessel vasculitis (HCC)    Follwed by Dr. Dane @ [facility], LOV 07/02/22. Pt is being treated with infusions of infliximab.   Malaise and fatigue 09/29/2019   Mediastinal adenopathy    Microvascular angina    microvascular dysfunction and chronic angina, follows w/ Dr. Hobart at [facility], LOV 06/27/22 as of 08/20/22, Patient is currently on Ranexa , Imdur , NTG patch and sublingual NTG prn., 05/29/22 Echo in Epic showed EF 65 - 70%, 08/19/22 NM PET CT Cardiac Perfusion in Epic showed no evidence of ischemia   Migraines 02/2022   Mononucleosis    Pneumonia    PONV (postoperative nausea  and vomiting)    Pulmonary nodules 12/13/2021   tiny 2 -3 mm nodules per 12/13/21 Chest CT in Epic done for persistent cough and night sweats   Seasonal allergies    Seronegative rheumatoid arthritis (HCC)    Followed by Dr. Assunta at [facility], LOV 07/02/22. Pt receives infusions of Avsola, last infusion 07/12/22 as of 08/21/22.   Seronegative rheumatoid arthritis (HCC)    Status post LEFT main pulmonary artery stent placement     Takayasu's arteritis (HCC)    Followed by Dr. Assunta at [facility], LOV 07/02/22 in Epic.   Viral meningitis    Vitamin D deficiency disease 09/29/2019    Past Surgical History:  Procedure Laterality Date   BRONCHIAL WASHINGS N/A 04/01/2024   Procedure: IRRIGATION, BRONCHUS;  Surgeon: Parris Manna, MD;  Location: [facility] ORS;  Service: Thoracic;  Laterality: N/A;   FLEXIBLE BRONCHOSCOPY N/A 04/01/2024   Procedure: ELLIOTT SIDE;  Surgeon: Parris Manna, MD;  Location: [facility] ORS;  Service: Thoracic;  Laterality: N/A;   HIP SURGERY Right 12/16/2011   IR THORACENTESIS ASP PLEURAL SPACE W/IMG GUIDE  11/20/2017   LAPAROSCOPIC ABDOMINAL EXPLORATION  2007,2009   endometriosis   MEDIASTINOTOMY CHAMBERLAIN MCNEIL    Left 11/16/2017   Procedure: LEFT ANTERIOR MEDIASTINOTOMY CHAMBERLAIN PROCEDURE;  Surgeon: Kerrin Elspeth BROCKS, MD;  Location: [facility] OR;  Service: Thoracic;  Laterality: Left;   NOSE SURGERY  12/15/2005   sinus surgery - reconstructe turbinates and deviated septum   Pulmonary Artery Stent Left 10/2018   ROBOTIC ASSISTED LAPAROSCOPIC HYSTERECTOMY AND SALPINGECTOMY Bilateral 10/08/2022   Procedure: XI ROBOTIC ASSISTED LAPAROSCOPIC TOTAL HYSTERECTOMY AND BILATERAL SALPINGECTOMY;  Surgeon: Lavoie, Marie-Lyne, MD;  Location: [facility] OR;  Service: Gynecology;  Laterality: Bilateral;       Inpatient Medications: Scheduled Meds:  Continuous Infusions:  PRN Meds:   Allergies:    Allergies  Allergen Reactions   Codeine Nausea Only   Crestor  [Rosuvastatin ] Other (See Comments)    Pt reports causes joint and muscle aches    Social History:   Social History   Socioeconomic History   Marital status: Married    Spouse name: PERSONAL ASSISTANT (Spouse)   Number of children: Not on file   Years of education: Not on file   Highest education level: Not on file  Occupational History   Not on file  Tobacco Use   Smoking status: Never    Passive exposure: Never    Smokeless tobacco: Never  Vaping Use   Vaping status: Never Used  Substance and Sexual Activity   Alcohol use: No    Alcohol/week: 0.0 standard drinks of alcohol   Drug use: No   Sexual activity: Not Currently    Partners: Male    Birth control/protection: Surgical    Comment: hysterectomy  Other Topics Concern   Not on file  Social History Narrative   Married for 12 years.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   Social Drivers of Health   Financial Resource Strain: Low Risk  (04/14/2024)   Received from [facility]   Overall Financial Resource Strain (CARDIA)    Difficulty of Paying Living Expenses: Not hard at all  Food Insecurity: No Food Insecurity (04/14/2024)   Received from [facility]   Hunger Vital Sign    Within the past 12 months, you worried that your food would run out before you got the money to buy more.: Never true    Within the past 12 months,  the food you bought just didn't last and you didn't have money to get more.: Never true  Transportation Needs: No Transportation Needs (04/14/2024)   Received from [facility] - Transportation    In the past 12 months, has lack of transportation kept you from medical appointments or from getting medications?: No    Lack of Transportation (Non-Medical): No  Physical Activity: Not on file  Stress: Not on file  Social Connections: Unknown (02/11/2023)   Received from [facility]   Social Network    Social Network: Not on file  Intimate Partner Violence: Unknown (02/11/2023)   Received from [facility]   HITS    Physically Hurt: Not on file    Insult or Talk Down To: Not on file    Threaten Physical Harm: Not on file    Scream or Curse: Not on file    Family History:    Family History  Problem Relation Age of Onset   Heart disease Mother    Stroke Mother    Diabetes Mother    Brain cancer Mother 19       glioblastoma   Heart  disease Father    Alcohol abuse Father    Alcoholism Father    Other Sister        FAP   Colonic polyp Sister 30       adenomatous polyp   Breast cancer Maternal Aunt    Lung cancer Paternal Uncle 31   Other Paternal Uncle 78       MVA   Pancreatic disease Maternal Grandmother    Stomach cancer Maternal Grandmother    Colon cancer Maternal Grandmother    Heart attack Paternal Grandfather    Leukemia Cousin 12   Breast cancer Cousin 66       neg GT   Esophageal cancer Neg Hx    Liver disease Neg Hx    Rectal cancer Neg Hx      ROS:  Please see the history of present illness.  ROS  All other ROS reviewed and negative.     Physical Exam/Data:   Vitals:   10/13/24 1115 10/13/24 1130 10/13/24 1145 10/13/24 1400  BP: 97/69 107/69 101/60 100/63  Pulse: 75 69 66 73  Resp: 16 11 11 17   SpO2: 100% 100% 99% 99%  Weight:      Height:        Intake/Output Summary (Last 24 hours) at 10/13/2024 1603 Last data filed at 10/13/2024 1120 Gross per 24 hour  Intake 500 ml  Output --  Net 500 ml   Filed Weights   10/13/24 1003  Weight: 70 kg   Body mass index is 26.49 kg/m.  General:  Well nourished, well developed, in no acute distress HEENT: normal Lymph: no adenopathy Neck: no JVD Endocrine:  No thryomegaly Vascular: No carotid bruits; FA pulses 2+ bilaterally without bruits  Cardiac:  normal S1, S2; RRR; no murmur  Lungs:  clear to auscultation bilaterally, no wheezing, rhonchi or rales  Abd: soft, nontender, no hepatomegaly  Ext: no edema Musculoskeletal:  No deformities, BUE and BLE strength normal and equal Skin: warm and dry  Neuro:  CNs 2-12 intact, no focal abnormalities noted Psych:  Normal affect   EKG:  The EKG was personally reviewed and demonstrates:   Telemetry:  Telemetry was personally reviewed and demonstrates:    Relevant CV Studies:   Laboratory Data:  Chemistry Recent Labs  Lab 10/13/24 1031  NA  137  K 4.1  CL 100  CO2 27  GLUCOSE  108*  BUN 19  CREATININE 0.66  CALCIUM  9.7  GFRNONAA >60  ANIONGAP 10    No results for input(s): PROT, ALBUMIN, AST, ALT, ALKPHOS, BILITOT in the last 168 hours. Hematology Recent Labs  Lab 10/13/24 1031  WBC 6.7  RBC 3.82*  HGB 12.4  HCT 36.4  MCV 95.3  MCH 32.5  MCHC 34.1  RDW 12.1  PLT 313   Cardiac EnzymesNo results for input(s): TROPONINI in the last 168 hours. No results for input(s): TROPIPOC in the last 168 hours.  BNP Recent Labs  Lab 10/13/24 1031  PROBNP <50.0    DDimer No results for input(s): DDIMER in the last 168 hours.  Radiology/Studies:  MM 3D SCREENING MAMMOGRAM BILATERAL BREAST Result Date: 10/13/2024 CLINICAL DATA:  Screening. EXAM: DIGITAL SCREENING BILATERAL MAMMOGRAM WITH TOMOSYNTHESIS AND CAD TECHNIQUE: Bilateral screening digital craniocaudal and mediolateral oblique mammograms were obtained. Bilateral screening digital breast tomosynthesis was performed. The images were evaluated with computer-aided detection. COMPARISON:  Previous exam(s). ACR Breast Density Category c: The breasts are heterogeneously dense, which may obscure small masses. FINDINGS: There are no findings suspicious for malignancy. IMPRESSION: No mammographic evidence of malignancy. A result letter of this screening mammogram will be mailed directly to the patient. RECOMMENDATION: Screening mammogram in one year. (Code:SM-B-01Y) BI-RADS CATEGORY  1: Negative. Electronically Signed   By: Alm Parkins M.D.   On: 10/13/2024 13:06   CT Angio Chest Aorta W and/or Wo Contrast Result Date: 10/13/2024 EXAM: CTA CHEST AORTA 10/13/2024 12:08:13 PM TECHNIQUE: CTA of the chest was performed without and with the administration of 75 mL of intravenous iohexol (OMNIPAQUE) 350 MG/ML injection. Multiplanar reformatted images are provided for review. MIP images are provided for review. Automated exposure control, iterative reconstruction, and/or weight based adjustment of the mA/kV  was utilized to reduce the radiation dose to as low as reasonably achievable. COMPARISON: 01/21/2023 CLINICAL HISTORY: Acute aortic syndrome (AAS) suspected. FINDINGS: AORTA: No thoracic aortic dissection. No aneurysm. MEDIASTINUM: No mediastinal lymphadenopathy. The heart and pericardium demonstrate no acute abnormality. LYMPH NODES: No mediastinal, hilar or axillary lymphadenopathy. LUNGS AND PLEURA: There is no definite evidence of pulmonary embolus. A patent stent is again noted in the left pulmonary artery. A small loculated pleural effusion is noted laterally and in the left lower lobe. The lungs are without acute process. No focal consolidation or pulmonary edema. No pneumothorax. UPPER ABDOMEN: Limited images of the upper abdomen are unremarkable. SOFT TISSUES AND BONES: No acute bone or soft tissue abnormality. IMPRESSION: 1. No definite evidence of pulmonary embolus. No definite evidence of thoracic aortic dissection or aneurysm . 2. Patent stent in the left pulmonary artery. 3. Small loculated left pleural effusion, lateral in left lower lobe. Electronically signed by: Lynwood Seip MD 10/13/2024 12:33 PM EDT RP Workstation: [id number]   DG Chest 2 View Result Date: 10/13/2024 CLINICAL DATA:  Chest pain.  Dizziness.  Diaphoresis. EXAM: CHEST - 2 VIEW COMPARISON:  04/01/2024 FINDINGS: The heart size and mediastinal contours are within normal limits. Stent again seen in left pulmonary artery. Both lungs are clear. The visualized skeletal structures are unremarkable. IMPRESSION: No active cardiopulmonary disease. Electronically Signed   By: Norleen DELENA Kil M.D.   On: 10/13/2024 10:37    Assessment and Plan:   Unstable angina - Presented with multiple episodes of intermittent chest pain radiating to her back, jaw, shoulders, left arm associated with diffuse diaphoresis last night.  Completely resolved  with SL NTG.  Chest pain-free after arrival to the ER.  She never had any episodes of nocturnal chest  pain with diaphoresis in the past. - EKG and troponins within normal limits. - Patient has a history of Takayasu arteritis.  She underwent LHC in 2020 that showed normal coronary arteries and abnormal endothelial function (moderate diffuse epicardial coronary constriction with acetylcholine) consistent with coronary vasospasm. - Due to history of Takayasu arteritis, will need to rule out any new ostial coronary stenosis or aneurysms.  Will schedule her for [facility] on 10/14/2024.  Keep NPO after midnight.  CAD low on the differential due to normal coronaries in 2020, no need of ACS protocol.  She will be admitted to hospitalist service.  Transfer to [facility].  Informed consent for LHC Risks and benefits of cardiac catheterization have been discussed with the patient.  These include bleeding, infection, kidney damage, stroke, heart attack, death.  The patient understands these risks and is willing to proceed.  Coronary microvascular dysfunction Coronary vasospasm - Diagnosed in 2020 at [facility]. - Did not tolerate amlodipine  and diltiazem  due to hypotension, metoprolol  due to fatigue.  Currently tolerating Imdur  120 mg in the a.m. and 90 mg in the p.m. (takes it at 3 PM), ranolazine  1000 mg twice daily.  Continue same medications.  Will add diltiazem  60 mg in the p.m. and increase midodrine  from 5 mg to 7.5 mg 3 times daily.  Takayasu arteritis Rheumatoid arthritis - On infliximab and steroids.  Dose changed recently, did not tolerate. - Follows up with rheumatology.   60 minutes spent in reviewing prior medical records, more than 3 labs, discussion and documentation.  For questions or updates, please contact [facility] Please consult [email or web address] for contact info under Cardiology/STEMI.   Signed, Betsaida [place] Priya Tykeisha Peer, MD 10/13/2024 4:03 PM

## 2024-10-13 NOTE — H&P (Addendum)
 History and Physical    Kara Anderson FMW:[id number] DOB: 1976/08/25 DOA: 10/13/2024  PCP: Epifanio Alm SQUIBB, MD   Patient coming from: Home  I have personally briefly reviewed patient's old medical records in [facility]  Chief Complaint: Chest pain  HPI: Kara Anderson is a 48 y.o. female with medical history significant for tachycardias arteritis, rheumatoid arthritis, pulmonary artery stenosis status post stent in 2019, hypothyroidism. Patient presented to the ED with complaints of chest and upper back pain that started about 1 AM in the morning and woke her up from sleep.  Describes pain as a tightness with associated sweating.  No difficulty breathing.  She denies previous episodes of chest pain.  She took nitroglycerin  after the first episodes, and this completely resolved her pain.  But then she had recurrence of the same chest pain at least 7 times.  She took at least 10 doses of nitroglycerin , with improvement in symptoms each time.  ED Course: Temperature 97.9.  Heart rate 60s to 80s.  Respiratory rate 11-22.  Blood pressure systolic 97-110.  O2 sat greater than 97% on room air. Troponin less than 15 x 2.   CTA chest no acute abnormality, shows patent pulmonary stent, and small loculated left lateral lower lobe pleural effusion. Cardiology was consulted-recommend admission to [facility] for cardiac cath tomorrow.  Review of Systems: As per HPI all other systems reviewed and negative.  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   GERD (gastroesophageal reflux disease)    Heart murmur    pt reports she no  longer has this d/t control of her vasculitis   HLD (hyperlipidemia) 09/29/2019   Hypersensitivity pneumonitis (HCC)    As of 08/20/22, pt is being treated w/ Itraconazole. Pt follws with pulmonologist, Dr. Halina Picking @ [facility], LOV 06/18/22. Pt had abnormal chest imaging w/ ground glass infiltrates.   Hypothyroidism    Large vessel vasculitis (HCC)    Follwed by Dr. Dane @ [facility], LOV 07/02/22. Pt is being treated with infusions of infliximab.   Malaise and fatigue 09/29/2019   Mediastinal adenopathy    Microvascular angina    microvascular dysfunction and chronic angina, follows w/ Dr. Hobart at [facility], LOV 06/27/22 as of 08/20/22, Patient is currently on Ranexa , Imdur , NTG patch and sublingual NTG prn., 05/29/22 Echo in Epic showed EF 65 - 70%, 08/19/22 NM PET CT Cardiac Perfusion in Epic showed no evidence of ischemia   Migraines 02/2022   Mononucleosis    Pneumonia    PONV (postoperative nausea and vomiting)    Pulmonary nodules 12/13/2021   tiny 2 -3 mm nodules per 12/13/21 Chest CT in Epic done for persistent cough and night sweats   Seasonal allergies    Seronegative rheumatoid arthritis (HCC)    Followed by Dr. Assunta at [facility], LOV 07/02/22. Pt receives infusions of Avsola, last infusion 07/12/22 as of 08/21/22.   Seronegative rheumatoid arthritis (HCC)    Status post LEFT main pulmonary artery  stent placement    Takayasu's arteritis (HCC)    Followed by Dr. Assunta at [facility], LOV 07/02/22 in Epic.   Viral meningitis    Vitamin D deficiency disease 09/29/2019    Past Surgical History:  Procedure Laterality Date   BRONCHIAL WASHINGS N/A 04/01/2024   Procedure: IRRIGATION, BRONCHUS;  Surgeon: Parris Manna, MD;  Location: [facility] ORS;  Service: Thoracic;  Laterality: N/A;   FLEXIBLE BRONCHOSCOPY N/A 04/01/2024   Procedure: ELLIOTT SIDE;  Surgeon: Parris Manna, MD;  Location: [facility] ORS;  Service:  Thoracic;  Laterality: N/A;   HIP SURGERY Right 12/16/2011   IR THORACENTESIS ASP PLEURAL SPACE W/IMG GUIDE  11/20/2017   LAPAROSCOPIC ABDOMINAL EXPLORATION  2007,2009   endometriosis   MEDIASTINOTOMY CHAMBERLAIN MCNEIL    Left 11/16/2017   Procedure: LEFT ANTERIOR MEDIASTINOTOMY CHAMBERLAIN PROCEDURE;  Surgeon: Kerrin Elspeth BROCKS, MD;  Location: [facility] OR;  Service: Thoracic;  Laterality: Left;   NOSE SURGERY  12/15/2005   sinus surgery - reconstructe turbinates and deviated septum   Pulmonary Artery Stent Left 10/2018   ROBOTIC ASSISTED LAPAROSCOPIC HYSTERECTOMY AND SALPINGECTOMY Bilateral 10/08/2022   Procedure: XI ROBOTIC ASSISTED LAPAROSCOPIC TOTAL HYSTERECTOMY AND BILATERAL SALPINGECTOMY;  Surgeon: Lavoie, Marie-Lyne, MD;  Location: [facility] OR;  Service: Gynecology;  Laterality: Bilateral;     reports that she has never smoked. She has never been exposed to tobacco smoke. She has never used smokeless tobacco. She reports that she does not drink alcohol and does not use drugs.  Allergies  Allergen Reactions   Crestor  [Rosuvastatin ] Other (See Comments)    Pt reports causes joint and muscle aches   Codeine Nausea Only    Family History  Problem Relation Age of Onset   Heart disease Mother    Stroke Mother    Diabetes Mother    Brain cancer Mother 38       glioblastoma   Heart disease Father    Alcohol abuse Father    Alcoholism Father    Other Sister        FAP   Colonic polyp Sister 4       adenomatous polyp   Breast cancer Maternal Aunt    Lung cancer Paternal Uncle 68   Other Paternal Uncle 43       MVA   Pancreatic disease Maternal Grandmother    Stomach cancer Maternal Grandmother    Colon cancer Maternal Grandmother    Heart attack Paternal Grandfather    Leukemia Cousin 12   Breast cancer Cousin 76       neg GT   Esophageal cancer Neg Hx    Liver disease Neg Hx    Rectal cancer Neg Hx     Prior to Admission medications   Medication Sig Start Date End Date  Taking? Authorizing Provider  acidophilus (RISAQUAD) CAPS capsule Take 1 capsule by mouth daily.    [provider]  albuterol  (VENTOLIN  HFA) 108 (90 Base) MCG/ACT inhaler Inhale 2 puffs into the lungs every 6 (six) hours as needed for wheezing or shortness of breath.    [provider]  amitriptyline (ELAVIL) 10 MG tablet Take 40 mg by mouth at bedtime. 09/17/22   [provider]  aspirin 81 MG EC tablet Take 81 mg by mouth daily. Patient not taking: Reported on 06/10/2024    [provider]  Bempedoic Acid-Ezetimibe (NEXLIZET ) 180-10 MG TABS Take 1 tablet by mouth daily at 6 (six) AM. 03/29/24   Lonni Slain, MD  Cholecalciferol (VITAMIN D-3)  125 MCG (5000 UT) TABS Take 5,000 Units by mouth every other day.    [provider]  Coenzyme Q10 (CO Q 10 PO) Take 250 mg by mouth at bedtime.    [provider]  Evolocumab  (REPATHA  SURECLICK) 140 MG/ML SOAJ INJECT 140 MG SUBCUTANEOUSLY ONCE EVERY 14 DAYS 07/18/24   Lonni Slain, MD  gentamicin ointment (GARAMYCIN) 0.1 % Apply 1 Application topically 3 (three) times daily. 10/26/23   [provider]  inFLIXimab-axxq (AVSOLA) 100 MG injection Inject 100 mg into the vein every 6 (six) weeks.    [provider]  isosorbide  mononitrate (IMDUR ) 30 MG 24 hr tablet Take four (4) tablets I( 120 mg) n the am and three (3) tablets ( 90 mg) in the pm. 08/04/24   Walker, Caitlin S, NP  liothyronine (CYTOMEL) 5 MCG tablet Take 5 mcg by mouth daily. 06/10/23   [provider]  MAGNESIUM PO Take 400 mg by mouth at bedtime.    [provider]  methylPREDNISolone sodium succinate (SOLU-MEDROL) 2000 MG injection Inject 2,000 mg into the vein every 6 (six) weeks. 11/17/23   [provider]  midodrine  (PROAMATINE ) 5 MG tablet Take 1 tablet (5 mg total) by mouth 3 (three) times daily with meals. 06/10/24   Walker, Caitlin S, NP  MIEBO 1.338 GM/ML SOLN Apply 1 drop to  eye 4 (four) times daily. 06/20/23   [provider]  montelukast (SINGULAIR) 10 MG tablet Take 10 mg by mouth at bedtime.    [provider]  Multiple Vitamins-Minerals (MULTIVITAMIN ADULT) CHEW Chew 2 tablets by mouth daily.    [provider]  nitroGLYCERIN  (NITROSTAT ) 0.4 MG SL tablet PLACE 1 TABLET UNDER TONGUE EVERY 5 MINUTES AS NEEDED FOR CHEST PAIN 10/06/22   Walker, Caitlin S, NP  NURTEC 75 MG TBDP Take 75 mg by mouth daily as needed (migraine). 08/15/22   [provider]  ondansetron  (ZOFRAN -ODT) 8 MG disintegrating tablet Take 8 mg by mouth every 8 (eight) hours as needed for nausea or vomiting. 04/15/22   [provider]  predniSONE  (DELTASONE ) 10 MG tablet Take 10 mg by mouth daily as needed. 07/04/24   [provider]  ranolazine  (RANEXA ) 500 MG 12 hr tablet Take 2 tablets (1,000 mg total) by mouth 2 (two) times daily. 04/19/24   Walker, Caitlin S, NP  VITAMIN E PO Take 1 capsule by mouth every other day.    [provider]    Physical Exam: Vitals:   10/13/24 1445 10/13/24 1500 10/13/24 1515 10/13/24 1530  BP:  104/68  101/66  Pulse: 75 71 74 68  Resp: 17 12 16 14   SpO2: 100% 100% 100% 99%  Weight:      Height:        Constitutional: NAD, calm, comfortable Vitals:   10/13/24 1445 10/13/24 1500 10/13/24 1515 10/13/24 1530  BP:  104/68  101/66  Pulse: 75 71 74 68  Resp: 17 12 16 14   SpO2: 100% 100% 100% 99%  Weight:      Height:       Eyes: PERRL, lids and conjunctivae normal ENMT: Mucous membranes are moist.  Neck: normal, supple, no masses, no thyromegaly Respiratory: clear to auscultation bilaterally, no wheezing, no crackles. Normal respiratory effort. No accessory muscle use.  Cardiovascular: Regular rate and rhythm, no murmurs / rubs / gallops. No extremity edema.  Extremities warm. Abdomen: no tenderness, no masses palpated. No hepatosplenomegaly. Bowel sounds positive.  Musculoskeletal: no clubbing /  cyanosis.  No joint deformity upper and lower extremities. Good ROM, no contractures. Normal muscle tone.  Skin: no rashes, lesions, ulcers. No induration Neurologic: No facial asymmetry, moving extremities spontaneously, speech fluent.  Psychiatric: Normal judgment and insight. Alert and oriented x 3. Normal mood.   Labs on Admission: I have personally reviewed following labs and imaging studies  CBC: Recent Labs  Lab 10/13/24 1031  WBC 6.7  HGB 12.4  HCT 36.4  MCV 95.3  PLT 313   Basic Metabolic Panel: Recent Labs  Lab 10/13/24 1031  NA 137  K 4.1  CL 100  CO2 27  GLUCOSE 108*  BUN 19  CREATININE 0.66  CALCIUM  9.7   BNP (last 3 results) Recent Labs    10/13/24 1031  PROBNP <50.0   Radiological Exams on Admission: CT Angio Chest Aorta W and/or Wo Contrast Result Date: 10/13/2024 EXAM: CTA CHEST AORTA 10/13/2024 12:08:13 PM TECHNIQUE: CTA of the chest was performed without and with the administration of 75 mL of intravenous iohexol (OMNIPAQUE) 350 MG/ML injection. Multiplanar reformatted images are provided for review. MIP images are provided for review. Automated exposure control, iterative reconstruction, and/or weight based adjustment of the mA/kV was utilized to reduce the radiation dose to as low as reasonably achievable. COMPARISON: 01/21/2023 CLINICAL HISTORY: Acute aortic syndrome (AAS) suspected. FINDINGS: AORTA: No thoracic aortic dissection. No aneurysm. MEDIASTINUM: No mediastinal lymphadenopathy. The heart and pericardium demonstrate no acute abnormality. LYMPH NODES: No mediastinal, hilar or axillary lymphadenopathy. LUNGS AND PLEURA: There is no definite evidence of pulmonary embolus. A patent stent is again noted in the left pulmonary artery. A small loculated pleural effusion is noted laterally and in the left lower lobe. The lungs are without acute process. No focal consolidation or pulmonary edema. No pneumothorax. UPPER ABDOMEN: Limited images of the upper  abdomen are unremarkable. SOFT TISSUES AND BONES: No acute bone or soft tissue abnormality. IMPRESSION: 1. No definite evidence of pulmonary embolus. No definite evidence of thoracic aortic dissection or aneurysm . 2. Patent stent in the left pulmonary artery. 3. Small loculated left pleural effusion, lateral in left lower lobe. Electronically signed by: Lynwood Seip MD 10/13/2024 12:33 PM EDT RP Workstation: [id number]   DG Chest 2 View Result Date: 10/13/2024 CLINICAL DATA:  Chest pain.  Dizziness.  Diaphoresis. EXAM: CHEST - 2 VIEW COMPARISON:  04/01/2024 FINDINGS: The heart size and mediastinal contours are within normal limits. Stent again seen in left pulmonary artery. Both lungs are clear. The visualized skeletal structures are unremarkable. IMPRESSION: No active cardiopulmonary disease. Electronically Signed   By: Norleen DELENA Kil M.D.   On: 10/13/2024 10:37   EKG: Independently reviewed.  Sinus rhythm, rate 92, QTc 432.  No significant change from prior.  Assessment/Plan Principal Problem:   Chest pain Active Problems:   Vasculitis   Takayasu's arteritis (HCC)   Hypothyroidism   Pulmonary artery stenosis   Rheumatoid arthritis (HCC)   Assessment and Plan:  Chest pain-sudden onset today, and subsequently multiple episodes, all relieved with nitroglycerin .  Troponins less than 15 x 2.  EKG unremarkable.  CTA negative for PE, aneurysm or dissection.  History of Takayasu arteritis, underwent left heart cath 2020-normal coronary arteries and abnormal endothelial function, moderate diffuse epicardial coronary constriction with acetylcholine consistent with coronary vasospasm. - Per cardiology- Dr. Mallipeddi, due to history of arthritis, need to rule out new ostial coronary stenosis or aneurysms, admit to [facility], plan for left heart cath 10/14/2024, n.p.o. midnight.  CAD low on differential due to normal  coronaries 2020.  No need for ACS protocol. - ECHO - Blood pressure currently soft with  systolic of 101, dose of Imdur  reduced to 30 twice daily, diltiazem  30 mg started at night.  increase midodrine  from 5 to 7.5  3 times daily (in the past did not tolerate metoprolol  due to fatigue, diltiazem , Norvasc  due to hypotension).  Rheumatoid arthritis, takayasu arteritis -On infliximab and steroids   Pulmonary stenosis status post stent 2019-CTA today shows patent stent in left pulmonary artery.  DVT prophylaxis: Lovenox  Code Status: FULL Family Communication: None at bedside Disposition Plan: ~ 2 days Consults called: Cardiology Admission status: Inpt  tele  I certify that at the point of admission it is my clinical judgment that the patient will require inpatient hospital care spanning beyond 2 midnights from the point of admission due to high intensity of service, high risk for further deterioration and high frequency of surveillance required.    Author: Tully FORBES Carwin, MD 10/13/2024 5:43 PM  For on call review [email or web address].

## 2024-10-13 NOTE — ED Provider Notes (Signed)
 [place] EMERGENCY DEPARTMENT AT [facility] Provider Note   CSN: [id number] Arrival date & time: 10/13/24  9045     Patient presents with: Chest Pain   Kara Anderson is a 48 y.o. female.  History of Takyasu arteritis, high cholesterol, rheumatoid arthritis.  Has a stent in her left pulmonary artery, reportedly from damage from her tach as of arteritis from her first flare.  She is on infliximab infusions for the rheumatoid arthritis and the Takyasu arteritis. She presents ER today complaining of pain in her chest and upper back that awoke her from sleep abruptly at 1 AM.  She states the pain started in her left upper back and wraparound to her chest and felt like tightness, states she was sweating profusely, felt he should have bowel movement and went to the bathroom, felt very uncoordinated.  Ultimately found nitroglycerin  and states symptoms are felt resolved and she went back to sleep.  These episodes kept recurring throughout the night and she had 7 total episodes.  When she had another episode this morning she did come to the ER for further evaluation.  She denies shortness of breath or pleurisy, no lower extremity swelling or pain.  No fevers or chills.  States she felt slightly nauseous during the episodes.  Time the nitroglycerin  fully resolved her symptoms.  Records show she had cardiac perfusion test in September 2023 that not show any ischemia, EF was 58% and did not show coronary calcium .     Chest Pain      Prior to Admission medications   Medication Sig Start Date End Date Taking? Authorizing Provider  acidophilus (RISAQUAD) CAPS capsule Take 1 capsule by mouth daily.    [provider]  albuterol  (VENTOLIN  HFA) 108 (90 Base) MCG/ACT inhaler Inhale 2 puffs into the lungs every 6 (six) hours as needed for wheezing or shortness of breath.    [provider]  amitriptyline (ELAVIL) 10 MG tablet Take 40 mg by mouth at bedtime. 09/17/22   [provider]  aspirin 81 MG EC tablet Take 81 mg by mouth daily. Patient not taking: Reported on 06/10/2024    [provider]  Bempedoic Acid-Ezetimibe (NEXLIZET ) 180-10 MG TABS Take 1 tablet by mouth daily at 6 (six) AM. 03/29/24   Lonni Slain, MD  Cholecalciferol (VITAMIN D-3) 125 MCG (5000 UT) TABS Take 5,000 Units by mouth every other day.    [provider]  Coenzyme Q10 (CO Q 10 PO) Take 250 mg by mouth at bedtime.    [provider]  Evolocumab  (REPATHA  SURECLICK) 140 MG/ML SOAJ INJECT 140 MG SUBCUTANEOUSLY ONCE EVERY 14 DAYS 07/18/24   Lonni Slain, MD  gentamicin ointment (GARAMYCIN) 0.1 % Apply 1 Application topically 3 (three) times daily. 10/26/23   [provider]  inFLIXimab-axxq (AVSOLA) 100 MG injection Inject 100 mg into the vein every 6 (six) weeks.    [provider]  isosorbide  mononitrate (IMDUR ) 30 MG 24 hr tablet Take four (4) tablets I( 120 mg) n the am and three (3) tablets ( 90 mg) in the pm. 08/04/24   Walker, Caitlin S, NP  liothyronine (CYTOMEL) 5 MCG tablet Take 5 mcg by mouth daily. 06/10/23   [provider]  MAGNESIUM PO Take 400 mg by mouth at bedtime.    [provider]  methylPREDNISolone sodium succinate (SOLU-MEDROL) 2000 MG injection Inject 2,000 mg into the vein every 6 (six) weeks. 11/17/23   [provider]  midodrine  (PROAMATINE )  5 MG tablet Take 1 tablet (5 mg total) by mouth 3 (three) times daily with meals. 06/10/24   Walker, Caitlin S, NP  MIEBO 1.338 GM/ML SOLN Apply 1 drop to eye 4 (four) times daily. 06/20/23   [provider]  Multiple Vitamins-Minerals (MULTIVITAMIN ADULT) CHEW Chew 2 tablets by mouth daily.    [provider]  nitroGLYCERIN  (NITROSTAT ) 0.4 MG SL tablet PLACE 1 TABLET UNDER TONGUE EVERY 5 MINUTES AS NEEDED FOR CHEST PAIN 10/06/22   Walker, Caitlin S, NP  NURTEC 75 MG TBDP Take 75 mg by mouth daily as needed (migraine). 08/15/22    [provider]  ondansetron  (ZOFRAN -ODT) 8 MG disintegrating tablet Take 8 mg by mouth every 8 (eight) hours as needed for nausea or vomiting. 04/15/22   [provider]  ranolazine  (RANEXA ) 500 MG 12 hr tablet Take 2 tablets (1,000 mg total) by mouth 2 (two) times daily. 04/19/24   Walker, Caitlin S, NP  VITAMIN E PO Take 1 capsule by mouth every other day.    [provider]    Allergies: Codeine and Crestor  [rosuvastatin ]    Review of Systems  Cardiovascular:  Positive for chest pain.    Updated Vital Signs BP 110/64   Pulse 88   Resp 10   Ht 5' 4 (1.626 m)   Wt 70 kg   LMP 09/21/2022 (Exact Date)   SpO2 97%   BMI 26.49 kg/m   Physical Exam Vitals and nursing note reviewed.  Constitutional:      General: She is not in acute distress.    Appearance: She is well-developed.  HENT:     Head: Normocephalic and atraumatic.  Eyes:     Conjunctiva/sclera: Conjunctivae normal.  Cardiovascular:     Rate and Rhythm: Normal rate and regular rhythm.     Heart sounds: No murmur heard. Pulmonary:     Effort: Pulmonary effort is normal. No respiratory distress.     Breath sounds: Normal breath sounds.  Abdominal:     Palpations: Abdomen is soft.     Tenderness: There is no abdominal tenderness.  Musculoskeletal:        General: No swelling.     Cervical back: Neck supple.     Right lower leg: No tenderness. No edema.     Left lower leg: No tenderness. No edema.  Skin:    General: Skin is warm and dry.     Capillary Refill: Capillary refill takes less than 2 seconds.  Neurological:     General: No focal deficit present.     Mental Status: She is alert and oriented to person, place, and time.  Psychiatric:        Mood and Affect: Mood normal.     (all labs ordered are listed, but only abnormal results are displayed) Labs Reviewed  BASIC METABOLIC PANEL WITH GFR  CBC  TROPONIN T, HIGH SENSITIVITY    EKG: None  Radiology: No results  found.   Procedures   Medications Ordered in the ED - No data to display                                  Medical Decision Making This patient presents to the ED for concern of chest and upper back pain since 1 am intermittently, this involves an extensive number of treatment options, and is a complaint that carries with it a high risk of complications and  morbidity.  The differential diagnosis includes ACS, dissection, PE, pneumonia, pneumothorax, esophageal spasms, GERD,    Co morbidities that complicate the patient evaluation :   Takayasu arteritis, RA, pulmonary artery stenosis s/p stent   Additional history obtained:  Additional history obtained from EMR External records from outside source obtained and reviewed including prior notes, labs, imaging, stress test from 2023   Lab Tests:  I Ordered, and personally interpreted labs.  The pertinent results include: Troponin negative, CBC and BMP reassuring, BNP is normal   Imaging Studies ordered:  I ordered imaging studies including chest x-ray which shows pulmonary edema or infiltrate I independently visualized and interpreted imaging within scope of identifying emergent findings  I agree with the radiologist interpretation   Cardiac Monitoring: / EKG:  The patient was maintained on a cardiac monitor.  I personally viewed and interpreted the cardiac monitored which showed an underlying rhythm of: Sinus rhythm, no STEMI, no ST or T changes   Consultations Obtained:  I requested consultation with the radiologist, Dr. Mallipeddi,  and discussed lab and imaging findings as well as pertinent plan - they recommend: Admission to [facility] for cardiac cath to hospitalist service.  She is going to write note and put in orders for additional cardiac meds she reports.   Problem List / ED Course / Critical interventions / Medication management  Having episodes of severe chest and upper back pain rating into the neck with  diaphoresis although fully resolved with nitroglycerin .  Patient has had no recurrent chest pain here in the ER.  Troponin negative x 2, EKG with no significant changes, given her pain in her back and history of arteritis I ordered a CTA for dissection and this was negative, no PE.  Did show a loculated left pleural effusion which patient is aware of and will need follow-up and evaluation as well.  Her story is very concerning for unstable angina, consulted cardiology as above who wants patient to be admitted to [facility] for cardiac cath.  Hospitalist service consulted for admission at this time.  I have reviewed the patients home medicines and have made adjustments as needed       Amount and/or Complexity of Data Reviewed Labs: ordered. Radiology: ordered.  Risk OTC drugs. Prescription drug management. Decision regarding hospitalization.        Final diagnoses:  None    ED Discharge Orders     None          Suellen Sherran LABOR, PA-C 10/13/24 1615    Elnor Jayson LABOR, DO 10/17/24 1615

## 2024-10-13 NOTE — Telephone Encounter (Signed)
 Per chart review patient is in ER.  I called her.  She said that the pain returned since her initial call and has been occurring about once every hour or so since 1 AM.  Assurance provided that cardiology will see her if needed and Dr. Lonni will be made aware.

## 2024-10-13 NOTE — ED Notes (Signed)
 Pt was given a turkey sandwich and two apple juices, that she ate prior to taken her medication.

## 2024-10-13 NOTE — Telephone Encounter (Signed)
   Pt c/o of Chest Pain: STAT if active CP, including tightness, pressure, jaw pain, radiating pain to shoulder/upper arm/back, CP unrelieved by Nitro. Symptoms reported of SOB, nausea, vomiting, sweating.  1. Are you having CP right now? No not at the time of call but did have CP right before calling   2. Are you experiencing any other symptoms (ex. SOB, nausea, vomiting, sweating)? Nausea, sweating   3. Is your CP continuous or coming and going? Come and go   4. Have you taken Nitroglycerin ? Yes - today   5. How long have you been experiencing CP? Last night    6. If NO CP at time of call then end call with telling Pt to call back or call 911 if Chest pain returns prior to return call from triage team.

## 2024-10-13 NOTE — ED Notes (Signed)
Up to b/r, steady gait 

## 2024-10-14 ENCOUNTER — Ambulatory Visit (HOSPITAL_COMMUNITY): Admission: RE | Admit: 2024-10-14 | Source: Home / Self Care | Admitting: Cardiology

## 2024-10-14 ENCOUNTER — Other Ambulatory Visit (HOSPITAL_COMMUNITY): Payer: Self-pay

## 2024-10-14 ENCOUNTER — Ambulatory Visit (HOSPITAL_COMMUNITY)
Admission: EM | Disposition: A | Discharge status: Home / Self Care | Payer: Self-pay | Source: Home / Self Care | Attending: Emergency Medicine

## 2024-10-14 ENCOUNTER — Inpatient Hospital Stay (HOSPITAL_COMMUNITY)

## 2024-10-14 DIAGNOSIS — R079 Chest pain, unspecified: Secondary | ICD-10-CM

## 2024-10-14 DIAGNOSIS — I200 Unstable angina: Secondary | ICD-10-CM | POA: Diagnosis not present

## 2024-10-14 DIAGNOSIS — R072 Precordial pain: Secondary | ICD-10-CM

## 2024-10-14 HISTORY — PX: LEFT HEART CATH AND CORONARY ANGIOGRAPHY: CATH118249

## 2024-10-14 LAB — ECHOCARDIOGRAM COMPLETE
Area-P 1/2: 3.08 cm2
Calc EF: 67.8 %
Height: 64 in
S' Lateral: 2.9 cm
Single Plane A2C EF: 68.5 %
Single Plane A4C EF: 66.7 %
Weight: 2427.2 [oz_av]

## 2024-10-14 LAB — BASIC METABOLIC PANEL WITH GFR
Anion gap: 12 (ref 5–15)
BUN: 12 mg/dL (ref 6–20)
CO2: 25 mmol/L (ref 22–32)
Calcium: 8.9 mg/dL (ref 8.9–10.3)
Chloride: 99 mmol/L (ref 98–111)
Creatinine, Ser: 0.73 mg/dL (ref 0.44–1.00)
GFR, Estimated: >60 mL/min (ref >60)
Glucose, Bld: 105 mg/dL — ABNORMAL HIGH (ref 70–99)
Potassium: 3.9 mmol/L (ref 3.5–5.1)
Sodium: 136 mmol/L (ref 135–145)

## 2024-10-14 LAB — CBC
HCT: 34.8 % — ABNORMAL LOW (ref 36.0–46.0)
HCT: 35.6 % — ABNORMAL LOW (ref 36.0–46.0)
Hemoglobin: 11.9 g/dL — ABNORMAL LOW (ref 12.0–15.0)
Hemoglobin: 11.8 g/dL — ABNORMAL LOW (ref 12.0–15.0)
MCH: 32.2 pg (ref 26.0–34.0)
MCH: 31.5 pg (ref 26.0–34.0)
MCHC: 34.2 g/dL (ref 30.0–36.0)
MCHC: 33.1 g/dL (ref 30.0–36.0)
MCV: 94.3 fL (ref 80.0–100.0)
MCV: 94.9 fL (ref 80.0–100.0)
Platelets: 303 K/uL (ref 150–400)
Platelets: 297 K/uL (ref 150–400)
RBC: 3.69 MIL/uL — ABNORMAL LOW (ref 3.87–5.11)
RBC: 3.75 MIL/uL — ABNORMAL LOW (ref 3.87–5.11)
RDW: 12 % (ref 11.5–15.5)
RDW: 12 % (ref 11.5–15.5)
WBC: 7.5 K/uL (ref 4.0–10.5)
WBC: 6.8 K/uL (ref 4.0–10.5)
nRBC: 0 % (ref 0.0–0.2)
nRBC: 0 % (ref 0.0–0.2)

## 2024-10-14 LAB — GENECONNECT MOLECULAR SCREEN: Genetic Analysis Overall Interpretation: NEGATIVE

## 2024-10-14 LAB — CREATININE, SERUM
Creatinine, Ser: 0.73 mg/dL (ref 0.44–1.00)
GFR, Estimated: >60 mL/min (ref >60)

## 2024-10-14 LAB — HIV ANTIBODY (ROUTINE TESTING W REFLEX): HIV Screen 4th Generation wRfx: NONREACTIVE

## 2024-10-14 SURGERY — LEFT HEART CATH AND CORONARY ANGIOGRAPHY
Anesthesia: LOCAL

## 2024-10-14 MED ORDER — FREE WATER
500.0000 mL | Freq: Once | Status: AC
  Administered 2024-10-14: 500 mL via ORAL

## 2024-10-14 MED ORDER — SODIUM CHLORIDE 0.9 % IV SOLN
INTRAVENOUS | Status: DC | PRN
  Administered 2024-10-14: 10 mL/h via INTRAVENOUS

## 2024-10-14 MED ORDER — MIDAZOLAM HCL (PF) 2 MG/2ML IJ SOLN
INTRAMUSCULAR | Status: DC | PRN
  Administered 2024-10-14: 1 mg via INTRAVENOUS

## 2024-10-14 MED ORDER — HYDRALAZINE HCL 20 MG/ML IJ SOLN: 10.0000 mg | INTRAMUSCULAR | Status: DC | PRN

## 2024-10-14 MED ORDER — ASPIRIN 81 MG PO CHEW
81.0000 mg | CHEWABLE_TABLET | ORAL | Status: AC
  Administered 2024-10-14: 81 mg via ORAL
  Filled 2024-10-14: qty 1

## 2024-10-14 MED ORDER — VERAPAMIL HCL 2.5 MG/ML IV SOLN
INTRAVENOUS | Status: AC
  Filled 2024-10-14: qty 2

## 2024-10-14 MED ORDER — FENTANYL CITRATE (PF) 100 MCG/2ML IJ SOLN
INTRAMUSCULAR | Status: DC | PRN
  Administered 2024-10-14: 25 ug via INTRAVENOUS

## 2024-10-14 MED ORDER — MIDODRINE HCL 2.5 MG PO TABS
7.5000 mg | ORAL_TABLET | Freq: Three times a day (TID) | ORAL | 0 refills | Status: DC
  Filled 2024-10-14: qty 90, 10d supply, fill #0

## 2024-10-14 MED ORDER — IOHEXOL 350 MG/ML SOLN
INTRAVENOUS | Status: DC | PRN
  Administered 2024-10-14: 20 mL

## 2024-10-14 MED ORDER — SODIUM CHLORIDE 0.9% FLUSH: 3.0000 mL | INTRAVENOUS | Status: DC | PRN

## 2024-10-14 MED ORDER — LIDOCAINE HCL (PF) 1 % IJ SOLN
INTRAMUSCULAR | Status: DC | PRN
  Administered 2024-10-14: 3 mL

## 2024-10-14 MED ORDER — HEPARIN (PORCINE) IN NACL 1000-0.9 UT/500ML-% IV SOLN
INTRAVENOUS | Status: DC | PRN
  Administered 2024-10-14 (×2): 500 mL

## 2024-10-14 MED ORDER — VERAPAMIL HCL 2.5 MG/ML IV SOLN
INTRAVENOUS | Status: DC | PRN
  Administered 2024-10-14: 10 mL via INTRA_ARTERIAL

## 2024-10-14 MED ORDER — HEPARIN SODIUM (PORCINE) 1000 UNIT/ML IJ SOLN
INTRAMUSCULAR | Status: AC
  Filled 2024-10-14: qty 10

## 2024-10-14 MED ORDER — MIDAZOLAM HCL 2 MG/2ML IJ SOLN
INTRAMUSCULAR | Status: AC
  Filled 2024-10-14: qty 2

## 2024-10-14 MED ORDER — HEPARIN SODIUM (PORCINE) 1000 UNIT/ML IJ SOLN
INTRAMUSCULAR | Status: DC | PRN
  Administered 2024-10-14: 3500 [IU] via INTRAVENOUS

## 2024-10-14 MED ORDER — LIDOCAINE HCL (PF) 1 % IJ SOLN
INTRAMUSCULAR | Status: AC
  Filled 2024-10-14: qty 30

## 2024-10-14 MED ORDER — ENOXAPARIN SODIUM 40 MG/0.4ML IJ SOSY
40.0000 mg | PREFILLED_SYRINGE | INTRAMUSCULAR | Status: DC
Start: 2024-10-15 — End: 2024-10-15

## 2024-10-14 MED ORDER — LABETALOL HCL 5 MG/ML IV SOLN: 10.0000 mg | INTRAVENOUS | Status: DC | PRN

## 2024-10-14 MED ORDER — SODIUM CHLORIDE 0.9% FLUSH: 3.0000 mL | Freq: Two times a day (BID) | INTRAVENOUS | Status: DC

## 2024-10-14 MED ORDER — FENTANYL CITRATE (PF) 100 MCG/2ML IJ SOLN
INTRAMUSCULAR | Status: AC
  Filled 2024-10-14: qty 2

## 2024-10-14 MED ORDER — ACETAMINOPHEN 325 MG PO TABS: 650.0000 mg | ORAL_TABLET | ORAL | Status: DC | PRN

## 2024-10-14 MED ORDER — SODIUM CHLORIDE 0.9 % IV SOLN: 250.0000 mL | INTRAVENOUS | Status: DC | PRN

## 2024-10-14 SURGICAL SUPPLY — 9 items
CATH INFINITI AMBI 5FR TG (CATHETERS) IMPLANT
CATH INFINITI JR4 5F (CATHETERS) IMPLANT
DEVICE RAD TR BAND REGULAR (VASCULAR PRODUCTS) IMPLANT
GLIDESHEATH SLEND A-KIT 6F 22G (SHEATH) IMPLANT
GLIDESHEATH SLEND SS 6F .021 (SHEATH) IMPLANT
GUIDEWIRE INQWIRE 1.5J.035X260 (WIRE) IMPLANT
PACK CARDIAC CATHETERIZATION (CUSTOM PROCEDURE TRAY) ×1 IMPLANT
SET ATX-X65L (MISCELLANEOUS) IMPLANT
SHEATH PROBE COVER 6X72 (BAG) IMPLANT

## 2024-10-14 NOTE — Hospital Course (Addendum)
 Kara Anderson is a 48 y.o. female with PMH of r tachycardias arteritis, rheumatoid arthritis, pulmonary artery stenosis status post stent in 2019, hypothyroidism presented to the ED with complaints of chest and upper back pain that started about 1 AM in the morning and woke her up from sleep. She took nitroglycerin  after the first episodes, and this completely resolved her pain.But then she had recurrence of the same chest pain at least 7 times.  She took at least 10 doses of nitroglycerin , with improvement in symptoms each time. ED Course: Temperature 97.9.  Heart rate 60s to 80s.  Respiratory rate 11-22.  Blood pressure systolic 97-110.  O2 sat greater than 97% on room air. Troponin less than 15 x 2.   CT angio chest> no PE patent stent in the left pulmonary artery small loculated left pleural effusion Seen by cardiology felt to be unstable angina and transferred to [facility] for cardiac cath given her history of takayasu arteritis. Cath was done and unremarkable and she is cleared for discharge home today, informed by cardiology team   Subjective: Seen and examined today Overnight on RA, afebrile, VSS, Labs OK, Trops neg x 2, bnp <50. Denies any chest pain nausea vomiting swelling of the extremities  Discharge Diagnoses:   Unstable angina Coronary microvascular dysfunction  Coronary vasospasm:: Presenting with multiple intermittent chest pain improved with nitroglycerin . Given her history of Takayasu arteritis.  had LHC in 2020 that showed normal coronary arteries and abnormal endothelial function (moderate diffuse epicardial coronary constriction with acetylcholine) consistent with coronary vasospasm.Diagnosed w/ coronary microvascular dysfunction and vasospasm at [facility], previously had not tolerated amlodipine  and diltiazem  due to hypotension and metoprolol  due to fatigue currently on Imdur , Ranexa . Due to history of Takayasu arteritis cardio concerned about any new ostial  coronary stenosis or aneurysms. Echo showed EF 65 to 70% no RWMA, underwent cardiac cath no significant cardiac disease Informed by cardiology that okay for discharge home today Discussed with cardiology okay to discharge home on home Imdur , Ranexa   Takayasu arteritis Rheumatoid arthritis: On infliximab and steroid dose. Dose changed recently,did not tolerate.  Followed by rheumatology  Pulmonary stenosis: Status post stent in 2019 CTA shows patent stent  Hypothyroidism Continue Synthroid  DVT prophylaxis: enoxaparin  (LOVENOX ) injection 40 mg Start: 10/15/24 0800 SCD's Start: 10/14/24 1506 Code Status:   Code Status: Full Code Family Communication: plan of care discussed with patient at bedside. Patient status is: Remains hospitalized because of severity of illness Level of care: Telemetry   Dispo: The patient is from: home            Anticipated disposition: home today Objective: Vitals last 24 hrs: Vitals:   10/14/24 1437 10/14/24 1439 10/14/24 1441 10/14/24 1458  BP: 117/69 113/65 114/63 (!) 117/48  Pulse: 77 76 82 69  Resp: 13 16 (!) 28 18  Temp:    98.6 F (37 C)  TempSrc:    Oral  SpO2: 100% 100%  99%  Weight:      Height:        Physical Examination: General exam: alert awake, oriented, older than stated age HEENT:Oral mucosa moist, Ear/Nose WNL grossly Respiratory system: Bilaterally clear BS,no use of accessory muscle Cardiovascular system: S1 & S2 +, No JVD. Gastrointestinal system: Abdomen soft,NT,ND, BS+ Nervous System: Alert, awake, moving all extremities,and following commands. Extremities: extremities warm, leg edema neg Skin: Warm, no rashes MSK: Normal muscle bulk,tone, power

## 2024-10-14 NOTE — Care Management Obs Status (Signed)
 MEDICARE OBSERVATION STATUS NOTIFICATION   Patient Details  Name: Kara Anderson MRN: [id number] Date of Birth: [birth year]   Medicare Observation Status Notification Given:  Yes    Sudie Erminio Deems, RN 10/14/2024, 4:32 PM

## 2024-10-14 NOTE — H&P (View-Only) (Signed)
  Progress Note  Patient Name: Kara Anderson Date of Encounter: 10/14/2024 [facility] Cardiologist: Shelda Bruckner, MD   Interval Summary    No further chest pain since being in the hospital. Planned for cardiac cath today.   Vital Signs Vitals:   10/13/24 2301 10/13/24 2320 10/14/24 0513 10/14/24 0515  BP: 117/65 126/67  (!) 110/50  Pulse: 74 79  94  Resp:    18  Temp: 98.7 F (37.1 C) 98.7 F (37.1 C)  97.6 F (36.4 C)  TempSrc: Oral Oral  Oral  SpO2: 99% 100%  100%  Weight: 68.3 kg  68.8 kg   Height:   5' 4 (1.626 m)     Intake/Output Summary (Last 24 hours) at 10/14/2024 0718 Last data filed at 10/14/2024 0511 Gross per 24 hour  Intake 1000 ml  Output --  Net 1000 ml      10/14/2024    5:13 AM 10/13/2024   11:01 PM 10/13/2024   10:03 AM  Last 3 Weights  Weight (lbs) 151 lb 11.2 oz 150 lb 9.6 oz 154 lb 5.2 oz  Weight (kg) 68.811 kg 68.312 kg 70 kg      Telemetry/ECG   Sinus Rhythm - Personally Reviewed  Physical Exam  GEN: No acute distress.   Neck: No JVD Cardiac: RRR, no murmurs, rubs, or gallops.  Respiratory: Clear to auscultation bilaterally. GI: Soft, nontender, non-distended  MS: No edema  Assessment & Plan   48 y/o female with PMH of Takayasu arteritis, rheumatoid arthritis, pulmonary artery stenosis s/p stent in 2019, mediastinal mass s/p excision in 2018 c/w elevated left hemidiaphragm, hypersensitivity pneumonitis with farmers lung, coronary vasospasm presented to ER at [facility] with chest pain.   Unstable Angina Hx of Coronary microvascular dysfunction Hx of Coronary Vasospasm  -- LHC in 2020 that showed normal coronary arteries and abnormal endothelial function (moderate diffuse epicardial coronary constriction with acetylcholine) consistent with coronary vasospasm  -- presented with multiple episodes of intermittent chest pain, hsTn negative x2 -- no further chest pain since admission -- transferred from [facility] with plans for  cardiac cath  Takayasu arteritis Rheumatoid arthritis -- On infliximab and steroids.  Dose changed recently, did not tolerate -- Follows up with rheumatology  Pulmonary artery stenosis s/p stenting '19  For questions or updates, please contact [facility] Please consult [email or web address] for contact info under   Signed, Manuelita Rummer, NP   I have personally seen and examined this patient. I agree with the assessment and plan as outlined above.  No chest pain this am. Troponin negative. History of coronary vasospasm and report of documented endothelial dysfunction at [facility]. Plans for cardiac cath today to exclude obstructive CAD.   Bruckner Cash, MD, [facility] 10/14/2024 9:57 AM

## 2024-10-14 NOTE — Plan of Care (Signed)

## 2024-10-14 NOTE — Interval H&P Note (Signed)
 History and Physical Interval Note:  10/14/2024 2:15 PM  Kara Anderson  has presented today for surgery, with the diagnosis of unstable angina.  The various methods of treatment have been discussed with the patient and family. After consideration of risks, benefits and other options for treatment, the patient has consented to  Procedure(s): LEFT HEART CATH AND CORONARY ANGIOGRAPHY (N/A) as a surgical intervention.  The patient's history has been reviewed, patient examined, no change in status, stable for surgery.  I have reviewed the patient's chart and labs.  Questions were answered to the patient's satisfaction.     Haruko Mersch J Jerilyn Gillaspie

## 2024-10-14 NOTE — Care Management CC44 (Signed)
 Condition Code 44 Documentation Completed  Patient Details  Name: Kara Anderson MRN: [id number] Date of Birth: 29-Aug-1976   Condition Code 44 given:  Yes Patient signature on Condition Code 44 notice:  Yes Documentation of 2 MD's agreement:  Yes Code 44 added to claim:  Yes    Sudie Erminio Deems, RN 10/14/2024, 4:32 PM

## 2024-10-14 NOTE — Plan of Care (Signed)

## 2024-10-14 NOTE — Progress Notes (Signed)
 PROGRESS NOTE Kara Anderson  FMW:[id number] DOB: 03-10-1976 DOA: 10/13/2024 PCP: Epifanio Alm SQUIBB, MD  Brief Narrative/Hospital Course: Kara Anderson is a 48 y.o. female with PMH of r tachycardias arteritis, rheumatoid arthritis, pulmonary artery stenosis status post stent in 2019, hypothyroidism presented to the ED with complaints of chest and upper back pain that started about 1 AM in the morning and woke her up from sleep. She took nitroglycerin  after the first episodes, and this completely resolved her pain.But then she had recurrence of the same chest pain at least 7 times.  She took at least 10 doses of nitroglycerin , with improvement in symptoms each time. ED Course: Temperature 97.9.  Heart rate 60s to 80s.  Respiratory rate 11-22.  Blood pressure systolic 97-110.  O2 sat greater than 97% on room air. Troponin less than 15 x 2.   CT angio chest> no PE patent stent in the left pulmonary artery small loculated left pleural effusion Seen by cardiology felt to be unstable angina and transferred to [facility] for [facility] cath given her history of takayasu arteritis   Subjective: Seen and examined today Was getting her echo done, waiting for cardiac cath this morning Overnight on RA, afebrile, VSS, Labs OK, Trops neg x 2, bnp <50. Denies any chest pain nausea vomiting swelling of the extremities  Assessment and plan:  Unstable angina Coronary microvascular dysfunction  Coronary vasospasm:: Presenting with multiple intermittent chest pain improved with nitroglycerin . Given her history of Takayasu arteritis.  had LHC in 2020 that showed normal coronary arteries and abnormal endothelial function (moderate diffuse epicardial coronary constriction with acetylcholine) consistent with coronary vasospasm.Diagnosed w/ coronary microvascular dysfunction and vasospasm at [facility], previously had not tolerated amlodipine  and diltiazem  due to hypotension and metoprolol  due to fatigue  currently on Imdur , Ranexa . Due to history of Takayasu arteritis cardio concerned about any new ostial coronary stenosis or aneurysms. Cardizem  60 added and midodrine  increased to 7.5 mg  Echo being done.  Awaiting cardiac cath today  Takayasu arteritis Rheumatoid arthritis: On infliximab and steroid dose. Dose changed recently,did not tolerate.  Followed by rheumatology  Pulmonary stenosis: Status post stent in 2019 CTA shows patent stent  Hypothyroidism Continue Synthroid  DVT prophylaxis: enoxaparin  (LOVENOX ) injection 40 mg Start: 10/13/24 2200 Code Status:   Code Status: Full Code Family Communication: plan of care discussed with patient at bedside. Patient status is: Remains hospitalized because of severity of illness Level of care: Telemetry   Dispo: The patient is from: home            Anticipated disposition: TBD Objective: Vitals last 24 hrs: Vitals:   10/13/24 2320 10/14/24 0513 10/14/24 0515 10/14/24 0749  BP: 126/67  (!) 110/50 (!) 104/59  Pulse: 79  94 80  Resp:   18 15  Temp: 98.7 F (37.1 C)  97.6 F (36.4 C) 98.6 F (37 C)  TempSrc: Oral  Oral Oral  SpO2: 100%  100% 98%  Weight:  68.8 kg    Height:  5' 4 (1.626 m)      Physical Examination: General exam: alert awake, oriented, older than stated age HEENT:Oral mucosa moist, Ear/Nose WNL grossly Respiratory system: Bilaterally clear BS,no use of accessory muscle Cardiovascular system: S1 & S2 +, No JVD. Gastrointestinal system: Abdomen soft,NT,ND, BS+ Nervous System: Alert, awake, moving all extremities,and following commands. Extremities: extremities warm, leg edema neg Skin: Warm, no rashes MSK: Normal muscle bulk,tone, power   Medications reviewed:  Scheduled Meds:  amitriptyline  40 mg Oral QHS   diltiazem   30 mg Oral QHS   enoxaparin  (LOVENOX ) injection  40 mg Subcutaneous Q24H   isosorbide  mononitrate  30 mg Oral QHS   liothyronine  5 mcg Oral Daily   midodrine   7.5 mg Oral TID WC    ranolazine   1,000 mg Oral BID   Continuous Infusions: Diet: Diet Order             Diet clear liquid Room service appropriate? Yes; Fluid consistency: Thin  Diet effective midnight           Diet NPO time specified Except for: Sips with Meds  Diet effective 0500           Diet NPO time specified  Diet effective midnight                   Data Reviewed: I have personally reviewed following labs and imaging studies ( see epic result tab) CBC: Recent Labs  Lab 10/13/24 1031 10/14/24 0427  WBC 6.7 7.5  HGB 12.4 11.9*  HCT 36.4 34.8*  MCV 95.3 94.3  PLT 313 303   CMP: Recent Labs  Lab 10/13/24 1031 10/14/24 0427  NA 137 136  K 4.1 3.9  CL 100 99  CO2 27 25  GLUCOSE 108* 105*  BUN 19 12  CREATININE 0.66 0.73  CALCIUM  9.7 8.9   GFR: Estimated Creatinine Clearance: 81.9 mL/min (by C-G formula based on SCr of 0.73 mg/dL). No results for input(s): AST, ALT, ALKPHOS, BILITOT, PROT, ALBUMIN in the last 168 hours. No results for input(s): LIPASE, AMYLASE in the last 168 hours. No results for input(s): AMMONIA in the last 168 hours. Coagulation Profile: No results for input(s): INR, PROTIME in the last 168 hours. Unresulted Labs (From admission, onward)    None      Antimicrobials/Microbiology: Anti-infectives (From admission, onward)    None         Component Value Date/Time   SDES  04/01/2024 1056    BRONCHIAL ALVEOLAR LAVAGE Performed at [facility], [address] [place] [place], [place] [address]    SDES  04/01/2024 1056    BRONCHIAL ALVEOLAR LAVAGE Performed at [facility], [address] [place], [place] [address]    [facility]  04/01/2024 1056    NONE Performed at [facility], [address] [place], [place] [address]    [facility]  04/01/2024 1056    NONE Performed at [facility], [address] [place], [place] [address]    CULT (A) 04/01/2024 1056    100 COLONIES/mL Normal  respiratory flora-no Staph aureus or Pseudomonas seen Performed at [facility], [address] [place], [place] [address]    CULT  04/01/2024 1056    NO FUNGUS ISOLATED AFTER 21 DAYS Performed at [facility], [address] [place], [place] [address]    REPTSTATUS 04/04/2024 FINAL 04/01/2024 1056   REPTSTATUS 04/22/2024 FINAL 04/01/2024 1056    Procedures: Procedure(s) (LRB): LEFT HEART CATH AND CORONARY ANGIOGRAPHY (N/A)   Mennie LAMY, MD [facility] 10/14/2024, 10:59 AM

## 2024-10-14 NOTE — Discharge Summary (Signed)
 Physician Discharge Summary  Kara Anderson FMW:[id number] DOB: Nov 02, 1976 DOA: 10/13/2024  PCP: Epifanio Alm SQUIBB, MD  Admit date: 10/13/2024 Discharge date: 10/14/2024 Recommendations for Outpatient Follow-up:  Follow up with PCP in 1 weeks-call for appointment Please obtain BMP/CBC in one week  Discharge Dispo: Home Discharge Condition: Stable Code Status:   Code Status: Full Code Diet recommendation:  Diet Order             Diet Heart Room service appropriate? Yes; Fluid consistency: Thin  Diet effective now           Diet - low sodium heart healthy                    Brief/Interim Summary: Kara Anderson is a 48 y.o. female with PMH of r tachycardias arteritis, rheumatoid arthritis, pulmonary artery stenosis status post stent in 2019, hypothyroidism presented to the ED with complaints of chest and upper back pain that started about 1 AM in the morning and woke her up from sleep. She took nitroglycerin  after the first episodes, and this completely resolved her pain.But then she had recurrence of the same chest pain at least 7 times.  She took at least 10 doses of nitroglycerin , with improvement in symptoms each time. ED Course: Temperature 97.9.  Heart rate 60s to 80s.  Respiratory rate 11-22.  Blood pressure systolic 97-110.  O2 sat greater than 97% on room air. Troponin less than 15 x 2.   CT angio chest> no PE patent stent in the left pulmonary artery small loculated left pleural effusion Seen by cardiology felt to be unstable angina and transferred to [facility] for cardiac cath given her history of takayasu arteritis. Cath was done and unremarkable and she is cleared for discharge home today, informed by cardiology team   Subjective: Seen and examined today Overnight on RA, afebrile, VSS, Labs OK, Trops neg x 2, bnp <50. Denies any chest pain nausea vomiting swelling of the extremities  Discharge Diagnoses:   Unstable angina Coronary microvascular dysfunction   Coronary vasospasm:: Presenting with multiple intermittent chest pain improved with nitroglycerin . Given her history of Takayasu arteritis.  had LHC in 2020 that showed normal coronary arteries and abnormal endothelial function (moderate diffuse epicardial coronary constriction with acetylcholine) consistent with coronary vasospasm.Diagnosed w/ coronary microvascular dysfunction and vasospasm at [facility], previously had not tolerated amlodipine  and diltiazem  due to hypotension and metoprolol  due to fatigue currently on Imdur , Ranexa . Due to history of Takayasu arteritis cardio concerned about any new ostial coronary stenosis or aneurysms. Echo showed EF 65 to 70% no RWMA, underwent cardiac cath no significant cardiac disease Informed by cardiology that okay for discharge home today Discussed with cardiology okay to discharge home on home Imdur , Ranexa   Takayasu arteritis Rheumatoid arthritis: On infliximab and steroid dose. Dose changed recently,did not tolerate.  Followed by rheumatology  Pulmonary stenosis: Status post stent in 2019 CTA shows patent stent  Hypothyroidism Continue Synthroid  DVT prophylaxis: enoxaparin  (LOVENOX ) injection 40 mg Start: 10/15/24 0800 SCD's Start: 10/14/24 1506 Code Status:   Code Status: Full Code Family Communication: plan of care discussed with patient at bedside. Patient status is: Remains hospitalized because of severity of illness Level of care: Telemetry   Dispo: The patient is from: home            Anticipated disposition: home today Objective: Vitals last 24 hrs: Vitals:   10/14/24 1437 10/14/24 1439 10/14/24 1441 10/14/24 1458  BP: 117/69  113/65 114/63 (!) 117/48  Pulse: 77 76 82 69  Resp: 13 16 (!) 28 18  Temp:    98.6 F (37 C)  TempSrc:    Oral  SpO2: 100% 100%  99%  Weight:      Height:        Physical Examination: General exam: alert awake, oriented, older than stated age HEENT:Oral mucosa moist, Ear/Nose  WNL grossly Respiratory system: Bilaterally clear BS,no use of accessory muscle Cardiovascular system: S1 & S2 +, No JVD. Gastrointestinal system: Abdomen soft,NT,ND, BS+ Nervous System: Alert, awake, moving all extremities,and following commands. Extremities: extremities warm, leg edema neg Skin: Warm, no rashes MSK: Normal muscle bulk,tone, power      Procedure(s) (LRB): LEFT HEART CATH AND CORONARY ANGIOGRAPHY (N/A)  Consultation: See note.  Discharge Instructions  Discharge Instructions     Diet - low sodium heart healthy   Complete by: As directed    Discharge instructions   Complete by: As directed    Please call call MD or return to ER for similar or worsening recurring problem that brought you to hospital or if any fever,nausea/vomiting,abdominal pain, uncontrolled pain, chest pain,  shortness of breath or any other alarming symptoms.  Please follow-up your doctor as instructed in a week time and call the office for appointment.  Please avoid alcohol, smoking, or any other illicit substance and maintain healthy habits including taking your regular medications as prescribed.  You were cared for by a hospitalist during your hospital stay. If you have any questions about your discharge medications or the care you received while you were in the hospital after you are discharged, you can call the unit and ask to speak with the hospitalist on call if the hospitalist that took care of you is not available.  Once you are discharged, your primary care physician will handle any further medical issues. Please note that NO REFILLS for any discharge medications will be authorized once you are discharged, as it is imperative that you return to your primary care physician (or establish a relationship with a primary care physician if you do not have one) for your aftercare needs so that they can reassess your need for medications and monitor your lab values   Increase activity slowly    Complete by: As directed       Allergies as of 10/14/2024       Reactions   Crestor  [rosuvastatin ] Other (See Comments)   Pt reports causes joint and muscle aches   Codeine Nausea Only        Medication List     STOP taking these medications    amoxicillin -clavulanate 875-125 MG tablet Commonly known as: AUGMENTIN        TAKE these medications    acidophilus Caps capsule Take 1 capsule by mouth daily.   albuterol  108 (90 Base) MCG/ACT inhaler Commonly known as: VENTOLIN  HFA Inhale 2 puffs into the lungs every 6 (six) hours as needed for wheezing or shortness of breath.   amitriptyline 10 MG tablet Commonly known as: ELAVIL Take 40 mg by mouth at bedtime.   Avsola 100 MG injection Generic drug: inFLIXimab-axxq Inject 100 mg into the vein every 28 (twenty-eight) days.   CO Q 10 PO Take 250 mg by mouth at bedtime.   gentamicin ointment 0.1 % Commonly known as: GARAMYCIN Apply 1 Application topically 3 (three) times daily.   isosorbide  mononitrate 30 MG 24 hr tablet Commonly known as: IMDUR  Take four (4) tablets I( 120 mg)  n the am and three (3) tablets ( 90 mg) in the pm. What changed:  how much to take how to take this when to take this additional instructions   liothyronine 5 MCG tablet Commonly known as: CYTOMEL Take 5 mcg by mouth daily.   MAGNESIUM PO Take 400 mg by mouth at bedtime.   methylPREDNISolone sodium succinate 2000 MG injection Commonly known as: SOLU-MEDROL Inject 2,000 mg into the vein every 6 (six) weeks.   midodrine  2.5 MG tablet Commonly known as: PROAMATINE  Take 3 tablets (7.5 mg total) by mouth 3 (three) times daily with meals. What changed:  medication strength how much to take   Miebo 1.338 GM/ML Soln Generic drug: Perfluorohexyloctane Place 1 drop into both eyes 4 (four) times daily.   Multivitamin Adult Chew Chew 2 tablets by mouth daily.   Nexlizet  180-10 MG Tabs Generic drug: Bempedoic Acid-Ezetimibe Take 1  tablet by mouth daily at 6 (six) AM. What changed:  when to take this additional instructions   nitroGLYCERIN  0.4 MG SL tablet Commonly known as: NITROSTAT  PLACE 1 TABLET UNDER TONGUE EVERY 5 MINUTES AS NEEDED FOR CHEST PAIN What changed: See the new instructions.   Nurtec 75 MG Tbdp Generic drug: Rimegepant Sulfate Take 75 mg by mouth daily as needed (migraine).   ondansetron  8 MG disintegrating tablet Commonly known as: ZOFRAN -ODT Take 8 mg by mouth every 8 (eight) hours as needed for nausea or vomiting.   predniSONE  10 MG tablet Commonly known as: DELTASONE  Take 10 mg by mouth daily as needed (joint pain).   ranolazine  500 MG 12 hr tablet Commonly known as: Ranexa  Take 2 tablets (1,000 mg total) by mouth 2 (two) times daily. What changed: additional instructions   Repatha  SureClick 140 MG/ML Soaj Generic drug: Evolocumab  INJECT 140 MG SUBCUTANEOUSLY ONCE EVERY 14 DAYS What changed: See the new instructions.   Vitamin D-3 125 MCG (5000 UT) Tabs Take 5,000 Units by mouth every other day.   VITAMIN E PO Take 1 capsule by mouth every other day.        Follow-up Information     Epifanio Alm SQUIBB, MD Follow up in 1 week(s).   Specialty: Infectious Diseases Contact information: [address] [place] [address] [phone]                Allergies  Allergen Reactions   Crestor  [Rosuvastatin ] Other (See Comments)    Pt reports causes joint and muscle aches   Codeine Nausea Only    The results of significant diagnostics from this hospitalization (including imaging, microbiology, ancillary and laboratory) are listed below for reference.    Microbiology: No results found for this or any previous visit (from the past 240 hours).  Procedures/Studies: CARDIAC CATHETERIZATION Result Date: 10/14/2024 Coronary angiography 10/14/2024: LM: Normal LAD: Normal, no significant disease Lcx: Normal, no significant disease RCA: Dominant, normal, no  significant disease LVEDP 18 mmHg Conclusion: No angiographic evidence of coronary artery disease Microvascular testing was not performed as previously, acetylcholine challenge test was positive, and we do not have it available in the lab Continue management for coronary vasospasm Okay to discharge home later today. Newman JINNY Lawrence, MD   ECHOCARDIOGRAM COMPLETE Result Date: 10/14/2024    ECHOCARDIOGRAM REPORT   Patient Name:   Kara Anderson Date of Exam: 10/14/2024 Medical Rec #:  [id number]     Height:       64.0 in Accession #:    [id number]    Weight:  151.7 lb Date of Birth:  08-16-1976     BSA:          1.740 m Patient Age:    48 years      BP:           110/50 mmHg Patient Gender: F             HR:           75 bpm. Exam Location:  Inpatient Procedure: 2D Echo (Both Spectral and Color Flow Doppler were utilized during            procedure). Indications:    CP  History:        Patient has prior history of Echocardiogram examinations.                 Signs/Symptoms:Chest Pain.  Sonographer:    Norleen Amour Referring Phys: [id number] VISHNU P MALLIPEDDI IMPRESSIONS  1. Left ventricular ejection fraction, by estimation, is 65 to 70%. Left ventricular ejection fraction by 2D MOD biplane is 67.8 %. The left ventricle has normal function. The left ventricle has no regional wall motion abnormalities. Left ventricular diastolic parameters were normal.  2. Right ventricular systolic function is normal. The right ventricular size is normal. There is normal pulmonary artery systolic pressure.  3. The mitral valve is normal in structure. No evidence of mitral valve regurgitation. No evidence of mitral stenosis.  4. The aortic valve is normal in structure. Aortic valve regurgitation is not visualized. No aortic stenosis is present.  5. The inferior vena cava is normal in size with greater than 50% respiratory variability, suggesting right atrial pressure of 3 mmHg. Comparison(s): No significant change from prior  study. Conclusion(s)/Recommendation(s): Normal biventricular function without evidence of hemodynamically significant valvular heart disease. FINDINGS  Left Ventricle: Left ventricular ejection fraction, by estimation, is 65 to 70%. Left ventricular ejection fraction by 2D MOD biplane is 67.8 %. The left ventricle has normal function. The left ventricle has no regional wall motion abnormalities. The left ventricular internal cavity size was normal in size. There is no left ventricular hypertrophy. Left ventricular diastolic parameters were normal. Right Ventricle: The right ventricular size is normal. No increase in right ventricular wall thickness. Right ventricular systolic function is normal. There is normal pulmonary artery systolic pressure. The tricuspid regurgitant velocity is 2.15 m/s, and  with an assumed right atrial pressure of 3 mmHg, the estimated right ventricular systolic pressure is 21.5 mmHg. Left Atrium: Left atrial size was normal in size. Right Atrium: Right atrial size was normal in size. Pericardium: There is no evidence of pericardial effusion. Mitral Valve: The mitral valve is normal in structure. No evidence of mitral valve regurgitation. No evidence of mitral valve stenosis. Tricuspid Valve: The tricuspid valve is normal in structure. Tricuspid valve regurgitation is trivial. No evidence of tricuspid stenosis. Aortic Valve: The aortic valve is normal in structure. Aortic valve regurgitation is not visualized. No aortic stenosis is present. Pulmonic Valve: The pulmonic valve was normal in structure. Pulmonic valve regurgitation is not visualized. No evidence of pulmonic stenosis. Aorta: The aortic root and ascending aorta are structurally normal, with no evidence of dilitation. Venous: The inferior vena cava is normal in size with greater than 50% respiratory variability, suggesting right atrial pressure of 3 mmHg. IAS/Shunts: No atrial level shunt detected by color flow Doppler.  LEFT  VENTRICLE PLAX 2D  Biplane EF (MOD) LVIDd:         4.70 cm         LV Biplane EF:   Left LVIDs:         2.90 cm                          ventricular LV PW:         0.90 cm                          ejection LV IVS:        0.60 cm                          fraction by LVOT diam:     2.00 cm                          2D MOD LV SV:         72                               biplane is LV SV Index:   41                               67.8 %. LVOT Area:     3.14 cm LV IVRT:       123 msec        Diastology                                LV e' medial:    10.40 cm/s                                LV E/e' medial:  7.3 LV Volumes (MOD)               LV e' lateral:   12.30 cm/s LV vol d, MOD    94.8 ml       LV E/e' lateral: 6.2 A2C: LV vol d, MOD    90.1 ml A4C: LV vol s, MOD    29.9 ml A2C: LV vol s, MOD    30.0 ml A4C: LV SV MOD A2C:   64.9 ml LV SV MOD A4C:   90.1 ml LV SV MOD BP:    63.9 ml RIGHT VENTRICLE             IVC RV Basal diam:  2.90 cm     IVC diam: 1.60 cm RV S prime:     12.10 cm/s TAPSE (M-mode): 2.3 cm      PULMONARY VEINS                             Diastolic Velocity: 46.30 cm/s                             S/D Velocity:       0.90  Systolic Velocity:  41.10 cm/s LEFT ATRIUM             Index        RIGHT ATRIUM           Index LA diam:        2.90 cm 1.67 cm/m   RA Area:     13.20 cm LA Vol (A2C):   39.2 ml 22.53 ml/m  RA Volume:   29.80 ml  17.13 ml/m LA Vol (A4C):   31.4 ml 18.05 ml/m LA Biplane Vol: 35.8 ml 20.58 ml/m  AORTIC VALVE             PULMONIC VALVE LVOT Vmax:   126.00 cm/s PV Vmax:       1.07 m/s LVOT Vmean:  73.500 cm/s PV Peak grad:  4.6 mmHg LVOT VTI:    0.229 m  AORTA Ao Root diam: 2.90 cm Ao Asc diam:  3.30 cm MITRAL VALVE               TRICUSPID VALVE MV Area (PHT): 3.08 cm    TR Peak grad:   18.5 mmHg MV Decel Time: 246 msec    TR Vmax:        215.00 cm/s MV E velocity: 76.30 cm/s MV A velocity: 69.00 cm/s  SHUNTS MV E/A ratio:  1.11         Systemic VTI:  0.23 m                            Systemic Diam: 2.00 cm Georganna Archer Electronically signed by Georganna Archer Signature Date/Time: 10/14/2024/11:53:32 AM    Final    MM 3D SCREENING MAMMOGRAM BILATERAL BREAST Result Date: 10/13/2024 CLINICAL DATA:  Screening. EXAM: DIGITAL SCREENING BILATERAL MAMMOGRAM WITH TOMOSYNTHESIS AND CAD TECHNIQUE: Bilateral screening digital craniocaudal and mediolateral oblique mammograms were obtained. Bilateral screening digital breast tomosynthesis was performed. The images were evaluated with computer-aided detection. COMPARISON:  Previous exam(s). ACR Breast Density Category c: The breasts are heterogeneously dense, which may obscure small masses. FINDINGS: There are no findings suspicious for malignancy. IMPRESSION: No mammographic evidence of malignancy. A result letter of this screening mammogram will be mailed directly to the patient. RECOMMENDATION: Screening mammogram in one year. (Code:SM-B-01Y) BI-RADS CATEGORY  1: Negative. Electronically Signed   By: Alm Parkins M.D.   On: 10/13/2024 13:06   CT Angio Chest Aorta W and/or Wo Contrast Result Date: 10/13/2024 EXAM: CTA CHEST AORTA 10/13/2024 12:08:13 PM TECHNIQUE: CTA of the chest was performed without and with the administration of 75 mL of intravenous iohexol (OMNIPAQUE) 350 MG/ML injection. Multiplanar reformatted images are provided for review. MIP images are provided for review. Automated exposure control, iterative reconstruction, and/or weight based adjustment of the mA/kV was utilized to reduce the radiation dose to as low as reasonably achievable. COMPARISON: 01/21/2023 CLINICAL HISTORY: Acute aortic syndrome (AAS) suspected. FINDINGS: AORTA: No thoracic aortic dissection. No aneurysm. MEDIASTINUM: No mediastinal lymphadenopathy. The heart and pericardium demonstrate no acute abnormality. LYMPH NODES: No mediastinal, hilar or axillary lymphadenopathy. LUNGS AND PLEURA: There is no  definite evidence of pulmonary embolus. A patent stent is again noted in the left pulmonary artery. A small loculated pleural effusion is noted laterally and in the left lower lobe. The lungs are without acute process. No focal consolidation or pulmonary edema. No pneumothorax. UPPER ABDOMEN: Limited images of the upper abdomen are unremarkable. SOFT TISSUES AND BONES: No acute bone or soft  tissue abnormality. IMPRESSION: 1. No definite evidence of pulmonary embolus. No definite evidence of thoracic aortic dissection or aneurysm . 2. Patent stent in the left pulmonary artery. 3. Small loculated left pleural effusion, lateral in left lower lobe. Electronically signed by: Lynwood Seip MD 10/13/2024 12:33 PM EDT RP Workstation: [id number]   DG Chest 2 View Result Date: 10/13/2024 CLINICAL DATA:  Chest pain.  Dizziness.  Diaphoresis. EXAM: CHEST - 2 VIEW COMPARISON:  04/01/2024 FINDINGS: The heart size and mediastinal contours are within normal limits. Stent again seen in left pulmonary artery. Both lungs are clear. The visualized skeletal structures are unremarkable. IMPRESSION: No active cardiopulmonary disease. Electronically Signed   By: Norleen DELENA Kil M.D.   On: 10/13/2024 10:37    Labs: BNP (last 3 results) No results for input(s): BNP in the last 8760 hours. Basic Metabolic Panel: Recent Labs  Lab 10/13/24 1031 10/14/24 0427  NA 137 136  K 4.1 3.9  CL 100 99  CO2 27 25  GLUCOSE 108* 105*  BUN 19 12  CREATININE 0.66 0.73  CALCIUM  9.7 8.9  CBC: Recent Labs  Lab 10/13/24 1031 10/14/24 0427  WBC 6.7 7.5  HGB 12.4 11.9*  HCT 36.4 34.8*  MCV 95.3 94.3  PLT 313 303  Urinalysis    Component Value Date/Time   COLORURINE YELLOW 06/20/2019 0828   APPEARANCEUR CLOUDY (A) 06/20/2019 0828   LABSPEC 1.020 06/20/2019 0828   PHURINE 6.0 06/20/2019 0828   GLUCOSEU NEGATIVE 06/20/2019 0828   HGBUR LARGE (A) 06/20/2019 0828   BILIRUBINUR NEGATIVE 06/20/2019 0828   KETONESUR NEGATIVE  06/20/2019 0828   PROTEINUR 100 (A) 06/20/2019 0828   NITRITE POSITIVE (A) 06/20/2019 0828   LEUKOCYTESUR LARGE (A) 06/20/2019 0828   Sepsis Labs Recent Labs  Lab 10/13/24 1031 10/14/24 0427  WBC 6.7 7.5  Microbiology No results found for this or any previous visit (from the past 240 hours).  Time coordinating discharge: 25 minutes  SIGNED: Mennie LAMY, MD  [facility] 10/14/2024, 3:10 PM  If 7PM-7AM, please contact night-coverage [email or web address]

## 2024-10-14 NOTE — Progress Notes (Signed)
  Echocardiogram 2D Echocardiogram has been performed.  Kara Anderson 10/14/2024, 10:02 AM

## 2024-10-14 NOTE — Progress Notes (Addendum)
  Progress Note  Patient Name: Kara Anderson Date of Encounter: 10/14/2024 [facility] Cardiologist: Shelda Bruckner, MD   Interval Summary    No further chest pain since being in the hospital. Planned for cardiac cath today.   Vital Signs Vitals:   10/13/24 2301 10/13/24 2320 10/14/24 0513 10/14/24 0515  BP: 117/65 126/67  (!) 110/50  Pulse: 74 79  94  Resp:    18  Temp: 98.7 F (37.1 C) 98.7 F (37.1 C)  97.6 F (36.4 C)  TempSrc: Oral Oral  Oral  SpO2: 99% 100%  100%  Weight: 68.3 kg  68.8 kg   Height:   5' 4 (1.626 m)     Intake/Output Summary (Last 24 hours) at 10/14/2024 0718 Last data filed at 10/14/2024 0511 Gross per 24 hour  Intake 1000 ml  Output --  Net 1000 ml      10/14/2024    5:13 AM 10/13/2024   11:01 PM 10/13/2024   10:03 AM  Last 3 Weights  Weight (lbs) 151 lb 11.2 oz 150 lb 9.6 oz 154 lb 5.2 oz  Weight (kg) 68.811 kg 68.312 kg 70 kg      Telemetry/ECG   Sinus Rhythm - Personally Reviewed  Physical Exam  GEN: No acute distress.   Neck: No JVD Cardiac: RRR, no murmurs, rubs, or gallops.  Respiratory: Clear to auscultation bilaterally. GI: Soft, nontender, non-distended  MS: No edema  Assessment & Plan   48 y/o female with PMH of Takayasu arteritis, rheumatoid arthritis, pulmonary artery stenosis s/p stent in 2019, mediastinal mass s/p excision in 2018 c/w elevated left hemidiaphragm, hypersensitivity pneumonitis with farmers lung, coronary vasospasm presented to ER at [facility] with chest pain.   Unstable Angina Hx of Coronary microvascular dysfunction Hx of Coronary Vasospasm  -- LHC in 2020 that showed normal coronary arteries and abnormal endothelial function (moderate diffuse epicardial coronary constriction with acetylcholine) consistent with coronary vasospasm  -- presented with multiple episodes of intermittent chest pain, hsTn negative x2 -- no further chest pain since admission -- transferred from [facility] with plans for  cardiac cath  Takayasu arteritis Rheumatoid arthritis -- On infliximab and steroids.  Dose changed recently, did not tolerate -- Follows up with rheumatology  Pulmonary artery stenosis s/p stenting '19  For questions or updates, please contact [facility] Please consult [email or web address] for contact info under   Signed, Manuelita Rummer, NP   I have personally seen and examined this patient. I agree with the assessment and plan as outlined above.  No chest pain this am. Troponin negative. History of coronary vasospasm and report of documented endothelial dysfunction at [facility]. Plans for cardiac cath today to exclude obstructive CAD.   Kara Cash, MD, [facility] 10/14/2024 9:57 AM

## 2024-10-15 ENCOUNTER — Encounter (HOSPITAL_COMMUNITY): Payer: Self-pay | Admitting: Cardiology

## 2024-10-15 ENCOUNTER — Encounter (HOSPITAL_BASED_OUTPATIENT_CLINIC_OR_DEPARTMENT_OTHER): Payer: Self-pay

## 2024-10-17 MED FILL — Verapamil HCl IV Soln 2.5 MG/ML: INTRAVENOUS | Qty: 2 | Status: AC

## 2024-10-28 ENCOUNTER — Encounter (HOSPITAL_BASED_OUTPATIENT_CLINIC_OR_DEPARTMENT_OTHER): Payer: Self-pay | Admitting: Family

## 2024-10-28 ENCOUNTER — Ambulatory Visit (HOSPITAL_BASED_OUTPATIENT_CLINIC_OR_DEPARTMENT_OTHER): Admitting: Family

## 2024-10-28 VITALS — BP 104/60 | HR 72 | Ht 64.0 in | Wt 152.3 lb

## 2024-10-28 DIAGNOSIS — I2089 Other forms of angina pectoris: Secondary | ICD-10-CM

## 2024-10-28 DIAGNOSIS — I952 Hypotension due to drugs: Secondary | ICD-10-CM

## 2024-10-28 DIAGNOSIS — I201 Angina pectoris with documented spasm: Secondary | ICD-10-CM | POA: Diagnosis not present

## 2024-10-28 MED ORDER — MIDODRINE HCL 2.5 MG PO TABS: 7.5000 mg | ORAL_TABLET | Freq: Three times a day (TID) | ORAL | 5 refills | Status: AC

## 2024-10-28 MED ORDER — AMLODIPINE BESYLATE 2.5 MG PO TABS: 2.5000 mg | ORAL_TABLET | Freq: Every evening | ORAL | 5 refills | Status: DC

## 2024-10-28 NOTE — Patient Instructions (Signed)
 Medication Instructions:   Continue Imdur  120mg  AM, 90mg  afternoon, 30mg  evening  START Amlodipine  2.5mg  every evening  Continue Midodrine  7.5mg  (three of the 2.5mg  tablets) three times per day with meals  *If you need a refill on your cardiac medications before your next appointment, please call your pharmacy*  Follow-Up: At [facility], you and your health needs are our priority.  As part of our continuing mission to provide you with exceptional heart care, our providers are all part of one team.  This team includes your primary Cardiologist (physician) and Advanced Practice Providers or APPs (Physician Assistants and Nurse Practitioners) who all work together to provide you with the care you need, when you need it.  Your next appointment:   To be determined based on response to Amlodipine   We recommend signing up for the patient portal called MyChart.  Sign up information is provided on this After Visit Summary.  MyChart is used to connect with patients for Virtual Visits (Telemedicine).  Patients are able to view lab/test results, encounter notes, upcoming appointments, etc.  Non-urgent messages can be sent to your provider as well.   To learn more about what you can do with MyChart, go to [email or web address].   Other Instructions  After one week on Amlodipine  if you are not having significant chest pain, you may stop the bedtime 30mg  Imdur .  Kara Anderson Finder, NP will send you a MyChart message in one week to check in!

## 2024-10-28 NOTE — Progress Notes (Signed)
 Cardiology Office Note:  .   Date:  10/28/2024  ID:  Kara Anderson Salt, DOB 1976/06/21, MRN [id number] PCP: Epifanio Alm SQUIBB, MD  [facility] Providers Cardiologist:  Shelda Bruckner, MD    History of Present Illness: .   Kara Anderson is a 48 y.o. female with a hx of takayasu arteriolitis and rheumatoid arthritis on infliximab followed by rheumatology, pulmonary stenosis s/p stenting 08/2018, mediastinal mass (11/2017 excise c/b elevated L hemidiaphragm), hyperlipidemia, microvascular dysfunction diagnosed at [facility] with chronic angina, hypersensitivity pneumonitis with Farmer's Lung, familial hyperlipidemia.    Previously followed by [facility] cardiology. Chest CT 07/2019 with patent pulmonary artery stent and no significant CAD. Seen at [facility] clinic and diagnosed with microvascular dysfunction due to persistent substernal pain.    Has followed closely with Dr. Hobart due to microvascular angina. Has since transitioned care to Dr. Bruckner. Did not tolerate Amlodipine  or Diltiazem  but had hypotension. Metoprolol  previously discontinued due to fatigue.    Follows with Dr. Aleskerov due to hypersensitivity pneumonitis with Farmer's lung (abnormal CT with ground glass infiltrates).   Seen 06/2022 persistent discomfort. Subsequent cardiac PET with normal LVEF, normal MBF with appropriate augmentation with stress. 10/2022 Nexlizet  added to Repatha  for familial hyperlipidemia.  Last seen 01/23/24. She was recommended to move second dose of midodrine  to lunch time and add third dose with dinner PRN for hypotension symptoms.   Had bronchoscopy with Dr. Aleskerov 04/01/24 with thick, whitish mucus likely allergic rather than infectious. Negative cultures. Recommended by pulmonology to continue PRN albuterol , focus on anti inflammatory lifestyle changes, continue immunosuppressive therapy.  At visit 04/27/24 with pulmonology, she was completing abx and prednisone  taper and changed from  aluterol to duoneb.   Admitted 10/30-10/31/25 with chest pain. Echo LVEF 65-70%, no RWMA. LHC 10/14/24 with no CAD.   Presents today for follow up. Notes chest pain that prompted her to present to ED was different than her prior microvascular pain.  Does note recent stressors.  Understandably frustrated as she was feeling well, added some weight training, began to feel poorly again.  Lengthy discussion regarding inflammation and the role of inflammation and microvascular dysfunction in the relationship between microvascular dysfunction and coronary vasospasm.  Reassurance provided regarding reassuring echocardiogram and LHC.  ROS: Please see the history of present illness.    All other systems reviewed and are negative.   Studies Reviewed: .          Cardiac Studies & Procedures   ______________________________________________________________________________________________ CARDIAC CATHETERIZATION  CARDIAC CATHETERIZATION 10/14/2024  Conclusion Coronary angiography 10/14/2024: LM: Normal LAD: Normal, no significant disease Lcx: Normal, no significant disease RCA: Dominant, normal, no significant disease  LVEDP 18 mmHg  Conclusion: No angiographic evidence of coronary artery disease Microvascular testing was not performed as previously, acetylcholine challenge test was positive, and we do not have it available in the lab Continue management for coronary vasospasm  Okay to discharge home later today.  Kara Kara Anderson Lawrence, MD  Findings Coronary Findings Diagnostic  Dominance: Right  Left Main Vessel is normal in caliber. Vessel is angiographically normal.  Left Anterior Descending Vessel is normal in caliber. Vessel is angiographically normal.  Left Circumflex Vessel is normal in caliber. Vessel is angiographically normal.  Right Coronary Artery Vessel is normal in caliber. Vessel is angiographically normal.  Intervention  No interventions have been  documented.   STRESS TESTS  NM PET CT CARDIAC PERFUSION MULTI W/ABSOLUTE BLOODFLOW 08/19/2022  Narrative   LV perfusion is normal. There is no  evidence of ischemia. There is no evidence of infarction.   Rest left ventricular function is normal. Rest EF: 58 %. Stress left ventricular function is normal. Stress EF: 70 %. End diastolic cavity size is normal. End systolic cavity size is normal.   Myocardial blood flow was computed to be 0.62ml/g/min at rest and 3.22ml/g/min at stress. Global myocardial blood flow reserve was 3.32 and was normal.   Coronary calcium  was absent on the attenuation correction CT images.   The study is normal. The study is low risk.  Electronically signed by Darryle Decent, MD _____________________________________________________________________________________________________ Kara Anderson: OVER-READ INTERPRETATION  CT CHEST  The following report is a limited chest CT over-read performed by radiologist Dr. Norleen Anderson of [facility] on 08/19/2022. This over-read does not include interpretation of cardiac or coronary anatomy or pathology, nor does it include evaluation of the PET data. The cardiac PET-CT interpretation by the cardiologist is attached.  COMPARISON:  None Available.  FINDINGS: Endovascular 3stent noted in the proximal left pulmonary artery. No suspicious nodules, masses, or infiltrates are identified in the visualized portion of the lungs. No pleural fluid seen.  The visualized portions of the mediastinum and chest wall are unremarkable.  IMPRESSION: No significant non-cardiac abnormality identified.   Electronically Signed By: Kara Kara Anderson M.D. On: 08/19/2022 13:47   ECHOCARDIOGRAM  ECHOCARDIOGRAM COMPLETE 10/14/2024  Narrative ECHOCARDIOGRAM REPORT    Patient Name:   Kara Anderson Date of Exam: 10/14/2024 Medical Rec #:  [id number]     Height:       64.0 in Accession #:    [id number]    Weight:       151.7 lb Date of Birth:   1976/07/05     BSA:          1.740 m Patient Age:    48 years      BP:           110/50 mmHg Patient Gender: F             HR:           75 bpm. Exam Location:  Inpatient  Procedure: 2D Echo (Both Spectral and Color Flow Doppler were utilized during procedure).  Indications:    CP  History:        Patient has prior history of Echocardiogram examinations. Signs/Symptoms:Chest Pain.  Sonographer:    Kara Amour Referring Phys: [id number] VISHNU P MALLIPEDDI  IMPRESSIONS   1. Left ventricular ejection fraction, by estimation, is 65 to 70%. Left ventricular ejection fraction by 2D MOD biplane is 67.8 %. The left ventricle has normal function. The left ventricle has no regional wall motion abnormalities. Left ventricular diastolic parameters were normal. 2. Right ventricular systolic function is normal. The right ventricular size is normal. There is normal pulmonary artery systolic pressure. 3. The mitral valve is normal in structure. No evidence of mitral valve regurgitation. No evidence of mitral stenosis. 4. The aortic valve is normal in structure. Aortic valve regurgitation is not visualized. No aortic stenosis is present. 5. The inferior vena cava is normal in size with greater than 50% respiratory variability, suggesting right atrial pressure of 3 mmHg.  Comparison(s): No significant change from prior study.  Conclusion(s)/Recommendation(s): Normal biventricular function without evidence of hemodynamically significant valvular heart disease.  FINDINGS Left Ventricle: Left ventricular ejection fraction, by estimation, is 65 to 70%. Left ventricular ejection fraction by 2D MOD biplane is 67.8 %. The left ventricle has normal function. The left ventricle has no  regional wall motion abnormalities. The left ventricular internal cavity size was normal in size. There is no left ventricular hypertrophy. Left ventricular diastolic parameters were normal.  Right Ventricle: The right  ventricular size is normal. No increase in right ventricular wall thickness. Right ventricular systolic function is normal. There is normal pulmonary artery systolic pressure. The tricuspid regurgitant velocity is 2.15 m/s, and with an assumed right atrial pressure of 3 mmHg, the estimated right ventricular systolic pressure is 21.5 mmHg.  Left Atrium: Left atrial size was normal in size.  Right Atrium: Right atrial size was normal in size.  Pericardium: There is no evidence of pericardial effusion.  Mitral Valve: The mitral valve is normal in structure. No evidence of mitral valve regurgitation. No evidence of mitral valve stenosis.  Tricuspid Valve: The tricuspid valve is normal in structure. Tricuspid valve regurgitation is trivial. No evidence of tricuspid stenosis.  Aortic Valve: The aortic valve is normal in structure. Aortic valve regurgitation is not visualized. No aortic stenosis is present.  Pulmonic Valve: The pulmonic valve was normal in structure. Pulmonic valve regurgitation is not visualized. No evidence of pulmonic stenosis.  Aorta: The aortic root and ascending aorta are structurally normal, with no evidence of dilitation.  Venous: The inferior vena cava is normal in size with greater than 50% respiratory variability, suggesting right atrial pressure of 3 mmHg.  IAS/Shunts: No atrial level shunt detected by color flow Doppler.   LEFT VENTRICLE PLAX 2D                        Biplane EF (MOD) LVIDd:         4.70 cm         LV Biplane EF:   Left LVIDs:         2.90 cm                          ventricular LV PW:         0.90 cm                          ejection LV IVS:        0.60 cm                          fraction by LVOT diam:     2.00 cm                          2D MOD LV SV:         72                               biplane is LV SV Index:   41                               67.8 %. LVOT Area:     3.14 cm LV IVRT:       123 msec        Diastology LV e' medial:     10.40 cm/s LV E/e' medial:  7.3 LV Volumes (MOD)               LV e' lateral:   12.30 cm/s LV vol d, MOD  94.8 ml       LV E/e' lateral: 6.2 A2C: LV vol d, MOD    90.1 ml A4C: LV vol s, MOD    29.9 ml A2C: LV vol s, MOD    30.0 ml A4C: LV SV MOD A2C:   64.9 ml LV SV MOD A4C:   90.1 ml LV SV MOD BP:    63.9 ml  RIGHT VENTRICLE             IVC RV Basal diam:  2.90 cm     IVC diam: 1.60 cm RV S prime:     12.10 cm/s TAPSE (M-mode): 2.3 cm      PULMONARY VEINS Diastolic Velocity: 46.30 cm/s S/D Velocity:       0.90 Systolic Velocity:  41.10 cm/s  LEFT ATRIUM             Index        RIGHT ATRIUM           Index LA diam:        2.90 cm 1.67 cm/m   RA Area:     13.20 cm LA Vol (A2C):   39.2 ml 22.53 ml/m  RA Volume:   29.80 ml  17.13 ml/m LA Vol (A4C):   31.4 ml 18.05 ml/m LA Biplane Vol: 35.8 ml 20.58 ml/m AORTIC VALVE             PULMONIC VALVE LVOT Vmax:   126.00 cm/s PV Vmax:       1.07 m/s LVOT Vmean:  73.500 cm/s PV Peak grad:  4.6 mmHg LVOT VTI:    0.229 m  AORTA Ao Root diam: 2.90 cm Ao Asc diam:  3.30 cm  MITRAL VALVE               TRICUSPID VALVE MV Area (PHT): 3.08 cm    TR Peak grad:   18.5 mmHg MV Decel Time: 246 msec    TR Vmax:        215.00 cm/s MV E velocity: 76.30 cm/s MV A velocity: 69.00 cm/s  SHUNTS MV E/A ratio:  1.11        Systemic VTI:  0.23 m Systemic Diam: 2.00 cm  Georganna Archer Electronically signed by Georganna Archer Signature Date/Time: 10/14/2024/11:53:32 AM    Final          ______________________________________________________________________________________________      Risk Assessment/Calculations:             Physical Exam:   VS:  BP 104/60 (BP Location: Left Arm, Patient Position: Sitting, Cuff Size: Normal)   Pulse 72   Ht 5' 4 (1.626 m)   Wt 152 lb 4.8 oz (69.1 kg)   LMP 09/21/2022 (Exact Date)   SpO2 99%   BMI 26.14 kg/m    Wt Readings from Last 3 Encounters:  10/28/24 152 lb 4.8 oz (69.1 kg)   10/14/24 151 lb 11.2 oz (68.8 kg)  06/10/24 152 lb (68.9 kg)    GEN: Well nourished, well developed in no acute distress NECK: No JVD; No carotid bruits CARDIAC: RRR, no murmurs, rubs, gallops RESPIRATORY:  Clear to auscultation without rales, wheezing or rhonchi  ABDOMEN: Soft, non-tender, non-distended EXTREMITIES:  No edema; No deformity   ASSESSMENT AND PLAN: .    Microvascular angina / Hypotension / Coronary vasospasm- Microvascular angina initially diagnosed at [facility] clinic. Has previously trialed multiple regimens. Did not tolerate Metoprolol  (fatigue), Diltiazem  (hypotension), Amlodipine  (Hypotension). Previously took breaks in her nitroglycerin  path to prevent  tolerance. Most recent admission 09/2024 with LHC with no CAD but different chest pain, likely coronary vasospasm. Persistent symptoms despite Ranexa  1000mg  BID, Imdur  120mg  AM/90mg  PM/30mg  bedtime.  BP improved with midodrine  7.5mg  TID. No significant lightheadedness.  Given improvement in BP with midodrine , hopeful will be able to tolerate CCB. Rx Amlodipine  2.5mg  at bedtime.  MyChart message in one week to check in, if tolerating Amlodipine  and no significant angina consider stopping bedtime Imdur  dose. Concern for nitrate tolerance given high dose.   Pulmonary artery stenosis S/p pulmonary artery stent 07980 - patent by cardiac PET 08/2022. Follows with pulmonology.   Familial hyperlipidemia - 10/2022 Lp(a) 216.3. Statin intolerance to Crestor . On Repatha  140 mg q. 14 days, Nexlizet  1 tablet daily  Takayatsu Arteritis / RA - Follows with rheumatology. Managed on immunosuppressants.  Pneumonitis and farmer's Lung - Follows with Dr. Parris of pulmonology       Dispo: follow up TBD based on response to Amlodipine   Signed, Reche GORMAN Finder, NP

## 2024-11-01 ENCOUNTER — Encounter (HOSPITAL_BASED_OUTPATIENT_CLINIC_OR_DEPARTMENT_OTHER): Payer: Self-pay

## 2024-11-01 ENCOUNTER — Encounter (HOSPITAL_BASED_OUTPATIENT_CLINIC_OR_DEPARTMENT_OTHER): Payer: Self-pay | Admitting: Family

## 2024-11-01 DIAGNOSIS — I2089 Other forms of angina pectoris: Secondary | ICD-10-CM

## 2024-11-01 DIAGNOSIS — I201 Angina pectoris with documented spasm: Secondary | ICD-10-CM

## 2024-11-04 MED ORDER — ISOSORBIDE MONONITRATE ER 30 MG PO TB24: 120.0000 mg | ORAL_TABLET | Freq: Every day | ORAL | Status: AC

## 2024-11-04 MED ORDER — ISOSORBIDE MONONITRATE ER 30 MG PO TB24
120.0000 mg | ORAL_TABLET | Freq: Every day | ORAL | Status: DC
Start: 2024-11-04 — End: 2024-11-04

## 2024-11-07 ENCOUNTER — Other Ambulatory Visit: Payer: Self-pay | Admitting: Pulmonary Disease

## 2024-11-07 DIAGNOSIS — J90 Pleural effusion, not elsewhere classified: Secondary | ICD-10-CM

## 2024-11-07 DIAGNOSIS — Z9289 Personal history of other medical treatment: Secondary | ICD-10-CM

## 2024-11-14 MED ORDER — AMLODIPINE BESYLATE 2.5 MG PO TABS: 5.0000 mg | ORAL_TABLET | Freq: Every evening | ORAL | Status: DC

## 2024-11-14 MED ORDER — AMLODIPINE BESYLATE 5 MG PO TABS: 5.0000 mg | ORAL_TABLET | Freq: Every evening | ORAL | 1 refills | Status: AC

## 2024-11-14 NOTE — Addendum Note (Signed)
 Addended by: Latonja Bobeck S on: 11/14/2024 09:50 AM   Modules accepted: Orders

## 2024-11-30 ENCOUNTER — Ambulatory Visit

## 2024-12-27 ENCOUNTER — Other Ambulatory Visit: Payer: Self-pay | Admitting: Pulmonary Disease

## 2024-12-27 DIAGNOSIS — J90 Pleural effusion, not elsewhere classified: Secondary | ICD-10-CM

## 2024-12-27 DIAGNOSIS — Z9289 Personal history of other medical treatment: Secondary | ICD-10-CM

## 2025-01-04 NOTE — Progress Notes (Signed)
 " Cardiology Office Note:  .   Date:  01/05/2025  ID:  Kara Anderson, DOB [birth year], MRN [id number] PCP: Epifanio Alm SQUIBB, MD  [facility] Providers Cardiologist:  Shelda Bruckner, MD {  History of Present Illness: .   Kara Anderson is a 49 y.o. female with PMH vasculitis (Takayasu), rheumatoid arthritis, L pulmonary artery stenosis s/p stent 2019, microvascular dysfunction with chronic angina, pSVT, hypersensitivity pneumonitis. She previously followed with Dr. Hobart and established care with me on 11/02/23  Pertinent CV history: Diagnosed with microvascular angina at the [facility] in 2020. Had been treated previously with nitroglycerin , and then treated with amlodipine  and metoprolol . She was transitioned to diltiazem  but had hypotension. Metoprolol  stopped due to fatigue. Has been treated with Imdur  120mg  BID, Ranexa  1000mg  BID, and nitro patch 0.3mg  every day. 06/2022 persistent discomfort. Subsequent cardiac PET with normal LVEF, normal MBF with appropriate augmentation with stress. 10/2022 Nexlizet  added to Repatha . Since that time, have had to titrate down medications due to hypotension.  Admitted 10/30-10/31/25 with chest pain. Echo LVEF 65-70%, no RWMA. LHC 10/14/24 with no CAD.   Today  Admitted with chest pain 09/2024, workup unremarkable. Has seen Reche Finder since, started on amlodipine  2.5 mg at bedtime. Has done fairly well, has not had severe chest pain since episode in October. Had some similar chest pain this AM while rushing to get to appointment.   Has felt cold at night, needed extra blanket. Was told she has chilblain's on her toes, was given a topical cream for this. Reviewed medications, vasoconstriction, discussed that amlodipine  is a vasodilator often used in Raynaud's. Has been improving with topical treatment.  Taking isosorbide  120 mg/midodrine  7.5 mg in AM. Takes midodrine  around noon. At 4 PM takes midodrine  7.5/isosorbide  90 mg. Takes  amlodipine  5 mg at nighttime.  ROS: Denies shortness of breath at rest or with normal exertion. No PND, orthopnea, LE edema or unexpected weight gain. No syncope or palpitations. ROS otherwise negative except as noted.   Studies Reviewed: SABRA    EKG:       Physical Exam:   VS:  BP 100/60 (BP Location: Right Arm, Patient Position: Sitting, Cuff Size: Normal)   Pulse 90   Ht 5' 4 (1.626 m)   Wt 154 lb 12.8 oz (70.2 kg)   LMP 09/21/2022   SpO2 97%   BMI 26.57 kg/m    Wt Readings from Last 3 Encounters:  01/05/25 154 lb 12.8 oz (70.2 kg)  10/28/24 152 lb 4.8 oz (69.1 kg)  10/14/24 151 lb 11.2 oz (68.8 kg)    GEN: Well nourished, well developed in no acute distress HEENT: Normal, moist mucous membranes NECK: No JVD CARDIAC: regular rhythm, normal S1 and S2, no rubs or gallops. No murmur. VASCULAR: Radial and DP pulses 2+ bilaterally. No carotid bruits RESPIRATORY:  Clear to auscultation without rales, wheezing or rhonchi  ABDOMEN: Soft, non-tender, non-distended MUSCULOSKELETAL:  Ambulates independently SKIN: Warm and dry, no edema NEUROLOGIC:  Alert and oriented x 3. No focal neuro deficits noted. PSYCHIATRIC:  Normal affect    ASSESSMENT AND PLAN: .    Microvascular angina Drug induced hypotension -see history above -did not tolerate metoprolol  2/2 fatigue, diltiazem  2/2 hypotension -now tolerating 5 mg amlodipine  -continue ranolazine , imdur  as above -requires midodrine  to tolerate imdur  -we have discussed hydration, compression stockings  Pulmonary artery stenosis s/p L PA stent 2019 -followed by [facility]ne arteritis/rheumatoid arthritis -follows with rheumatology  Hyperlipidemia Elevated lpa Statin  intolerance -continue repatha , bemedoic acid-ezetimibe--patient reports that primary care team willing to take this over, this is ok by me -last ldl 57 -lpa 10/2022 216.3  Dispo: 6 mos with Reche, 12 mos with me  Signed, Shelda Bruckner, MD    Shelda Bruckner, MD, PhD, [facility]  [facility]  [place]  Heart & V[facility] at [facility] at [facility] [address] [place], [place] [address] [phone]   "

## 2025-01-05 ENCOUNTER — Ambulatory Visit (HOSPITAL_BASED_OUTPATIENT_CLINIC_OR_DEPARTMENT_OTHER): Admitting: Cardiology

## 2025-01-05 ENCOUNTER — Encounter (HOSPITAL_BASED_OUTPATIENT_CLINIC_OR_DEPARTMENT_OTHER): Payer: Self-pay | Admitting: Cardiology

## 2025-01-05 VITALS — BP 100/60 | HR 90 | Ht 64.0 in | Wt 154.8 lb

## 2025-01-05 DIAGNOSIS — I952 Hypotension due to drugs: Secondary | ICD-10-CM

## 2025-01-05 DIAGNOSIS — M314 Aortic arch syndrome [Takayasu]: Secondary | ICD-10-CM

## 2025-01-05 DIAGNOSIS — I2089 Other forms of angina pectoris: Secondary | ICD-10-CM

## 2025-01-05 DIAGNOSIS — M069 Rheumatoid arthritis, unspecified: Secondary | ICD-10-CM | POA: Diagnosis not present

## 2025-01-05 DIAGNOSIS — Q256 Stenosis of pulmonary artery: Secondary | ICD-10-CM | POA: Diagnosis not present

## 2025-01-05 DIAGNOSIS — I201 Angina pectoris with documented spasm: Secondary | ICD-10-CM

## 2025-01-05 MED ORDER — NITROGLYCERIN 0.4 MG SL SUBL: 0.4000 mg | SUBLINGUAL_TABLET | SUBLINGUAL | 1 refills | Status: AC | PRN

## 2025-01-05 NOTE — Patient Instructions (Signed)
 Medication Instructions:  No changes *If you need a refill on your cardiac medications before your next appointment, please call your pharmacy*  Lab Work: none If you have labs (blood work) drawn today and your tests are completely normal, you will receive your results only by: MyChart Message (if you have MyChart) OR A paper copy in the mail If you have any lab test that is abnormal or we need to change your treatment, we will call you to review the results.  Testing/Procedures: none  Follow-Up: At [facility], you and your health needs are our priority.  As part of our continuing mission to provide you with exceptional heart care, our providers are all part of one team.  This team includes your primary Cardiologist (physician) and Advanced Practice Providers or APPs (Physician Assistants and Nurse Practitioners) who all work together to provide you with the care you need, when you need it.  Your next appointment:   6 month(s)  Provider:   Reche Finder, NP

## 2025-01-09 ENCOUNTER — Ambulatory Visit (HOSPITAL_BASED_OUTPATIENT_CLINIC_OR_DEPARTMENT_OTHER): Admitting: Family
